# Patient Record
Sex: Female | Born: 1945 | Race: White | Hispanic: No | Smoking: Never smoker
Health system: Southern US, Community
[De-identification: ages and names within clinical notes are randomized; demographics above are authoritative.]

## PROBLEM LIST (undated history)

## (undated) DIAGNOSIS — I10 Essential (primary) hypertension: Secondary | ICD-10-CM

## (undated) DIAGNOSIS — E785 Hyperlipidemia, unspecified: Secondary | ICD-10-CM

## (undated) HISTORY — PX: CHOLECYSTECTOMY: SHX55

## (undated) HISTORY — DX: Hyperlipidemia, unspecified: E78.5

---

## 1998-06-27 ENCOUNTER — Ambulatory Visit (HOSPITAL_COMMUNITY): Admission: RE | Admit: 1998-06-27 | Discharge: 1998-06-27 | Payer: Self-pay | Admitting: Internal Medicine

## 1999-06-20 ENCOUNTER — Ambulatory Visit (HOSPITAL_COMMUNITY): Admission: RE | Admit: 1999-06-20 | Discharge: 1999-06-20 | Payer: Self-pay | Admitting: Gastroenterology

## 1999-10-21 ENCOUNTER — Emergency Department (HOSPITAL_COMMUNITY): Admission: EM | Admit: 1999-10-21 | Discharge: 1999-10-21 | Payer: Self-pay | Admitting: Emergency Medicine

## 2000-06-10 ENCOUNTER — Encounter: Payer: Self-pay | Admitting: Internal Medicine

## 2000-06-10 ENCOUNTER — Ambulatory Visit (HOSPITAL_COMMUNITY): Admission: RE | Admit: 2000-06-10 | Discharge: 2000-06-10 | Payer: Self-pay | Admitting: Internal Medicine

## 2000-09-05 ENCOUNTER — Other Ambulatory Visit: Admission: RE | Admit: 2000-09-05 | Discharge: 2000-09-05 | Payer: Self-pay | Admitting: *Deleted

## 2001-03-11 ENCOUNTER — Encounter: Payer: Self-pay | Admitting: Orthopedic Surgery

## 2001-03-11 ENCOUNTER — Encounter: Admission: RE | Admit: 2001-03-11 | Discharge: 2001-03-11 | Payer: Self-pay | Admitting: Orthopedic Surgery

## 2003-07-04 ENCOUNTER — Encounter: Admission: RE | Admit: 2003-07-04 | Discharge: 2003-10-02 | Payer: Self-pay | Admitting: *Deleted

## 2003-10-23 ENCOUNTER — Emergency Department (HOSPITAL_COMMUNITY): Admission: EM | Admit: 2003-10-23 | Discharge: 2003-10-23 | Payer: Self-pay | Admitting: Emergency Medicine

## 2004-01-12 ENCOUNTER — Inpatient Hospital Stay (HOSPITAL_COMMUNITY): Admission: EM | Admit: 2004-01-12 | Discharge: 2004-01-16 | Payer: Self-pay | Admitting: Emergency Medicine

## 2004-01-12 IMAGING — CR DG SHOULDER 2+V PORT*R*
1 series · 1 of 1 positions shown · non-contrast
Comparison: none

CLINICAL DATA: Status post open reduction and internal fixation of a right humeral neck fracture.    
 PORTABLE SINGLE VIEW RIGHT SHOULDER

[view not recorded]
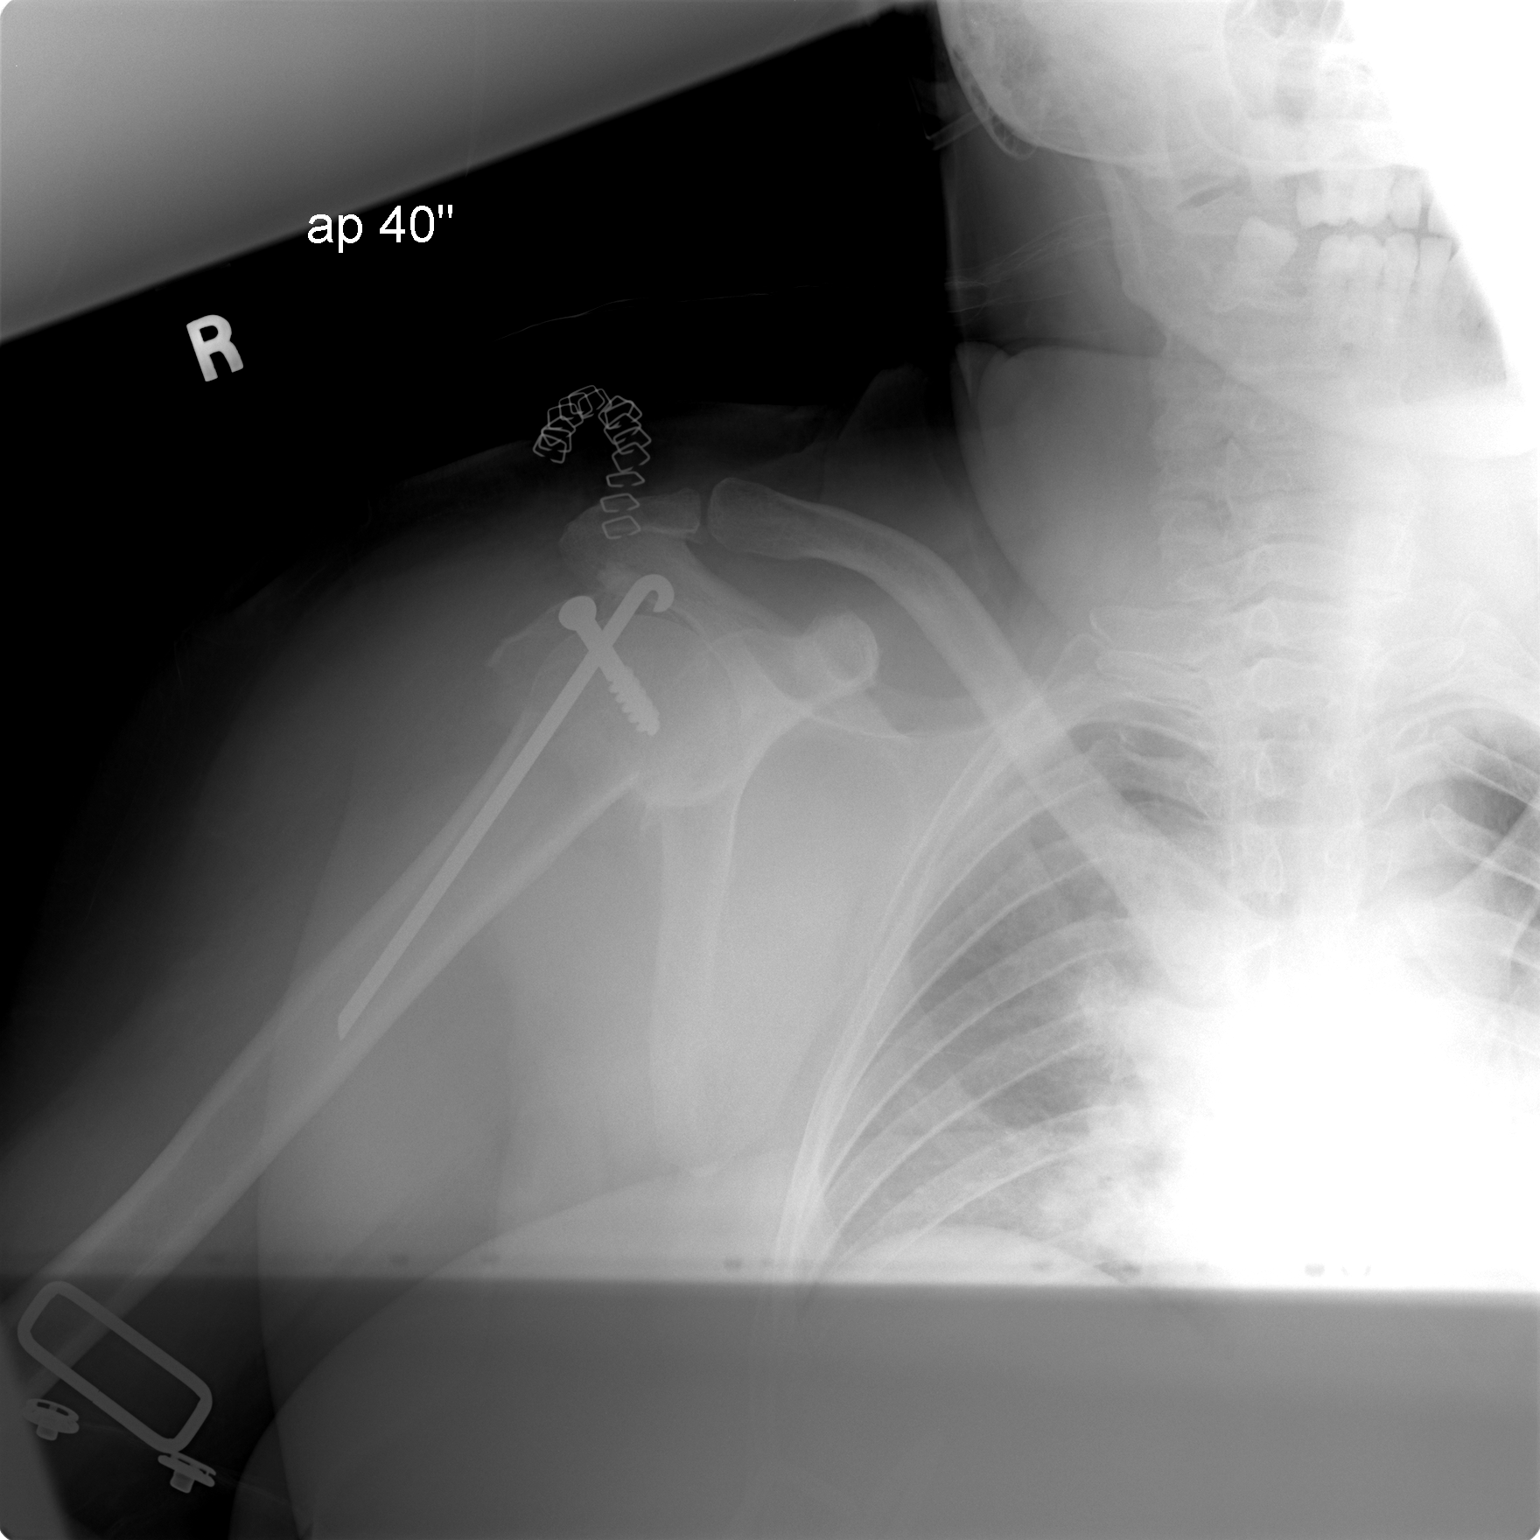

[1 of 1 positions shown; findings below may reference images not displayed]

FINDINGS: A single portable view of the right shoulder demonstrates open reduction and internal fixation of a right proximal humerus surgical neck fracture with residual impaction and a displaced fragment laterally.  Humeral head remains articulated to the glenoid fossa.  Overlying skin staples and soft tissue air is evident from the surgery.  
 IMPRESSION
 Stable post open reduction and internal fixation of a right proximal humerus surgical neck fracture with residual mild impaction and superolateral displaced fragment.

## 2004-01-12 IMAGING — CR DG SHOULDER 2+V*R*
2 series · 2 of 2 positions shown · non-contrast
Comparison: none

CLINICAL DATA: Fell.  
 RIGHT SHOULDER (TWO VIEWS)

[view not recorded (1 of 2)]
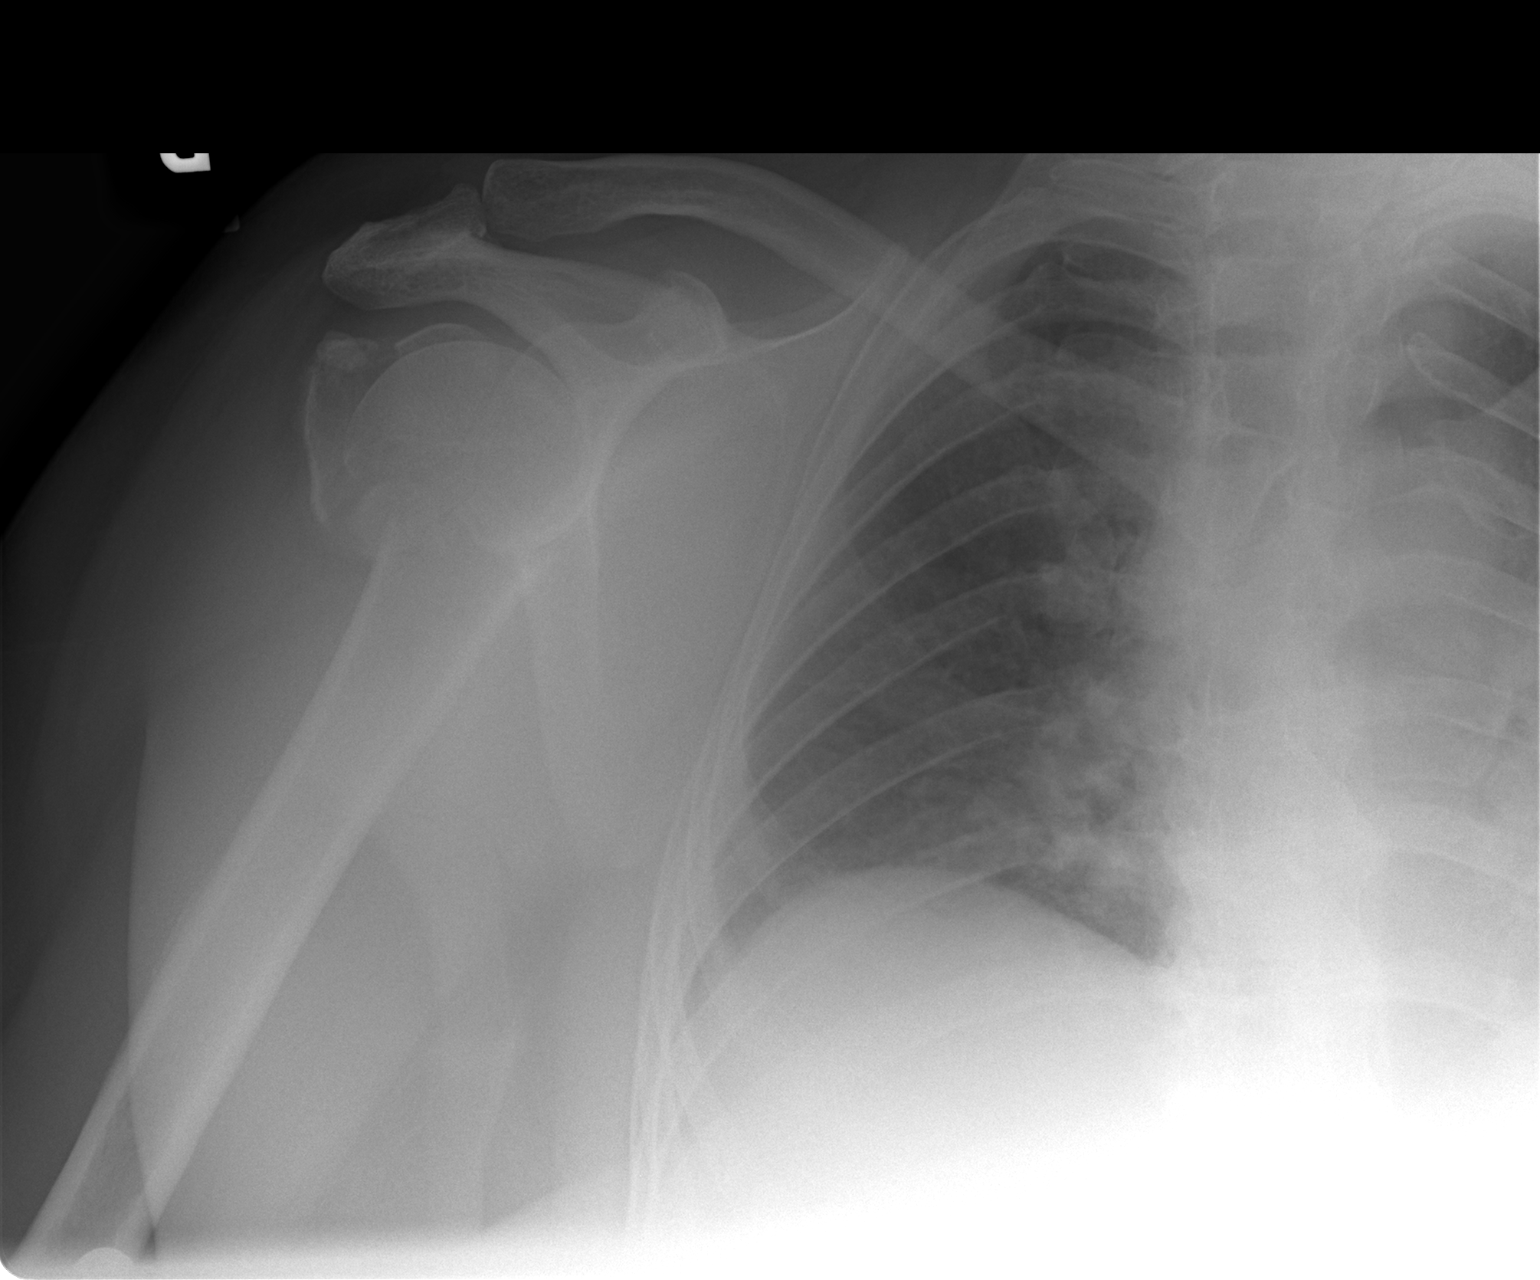

[view not recorded (2 of 2)]
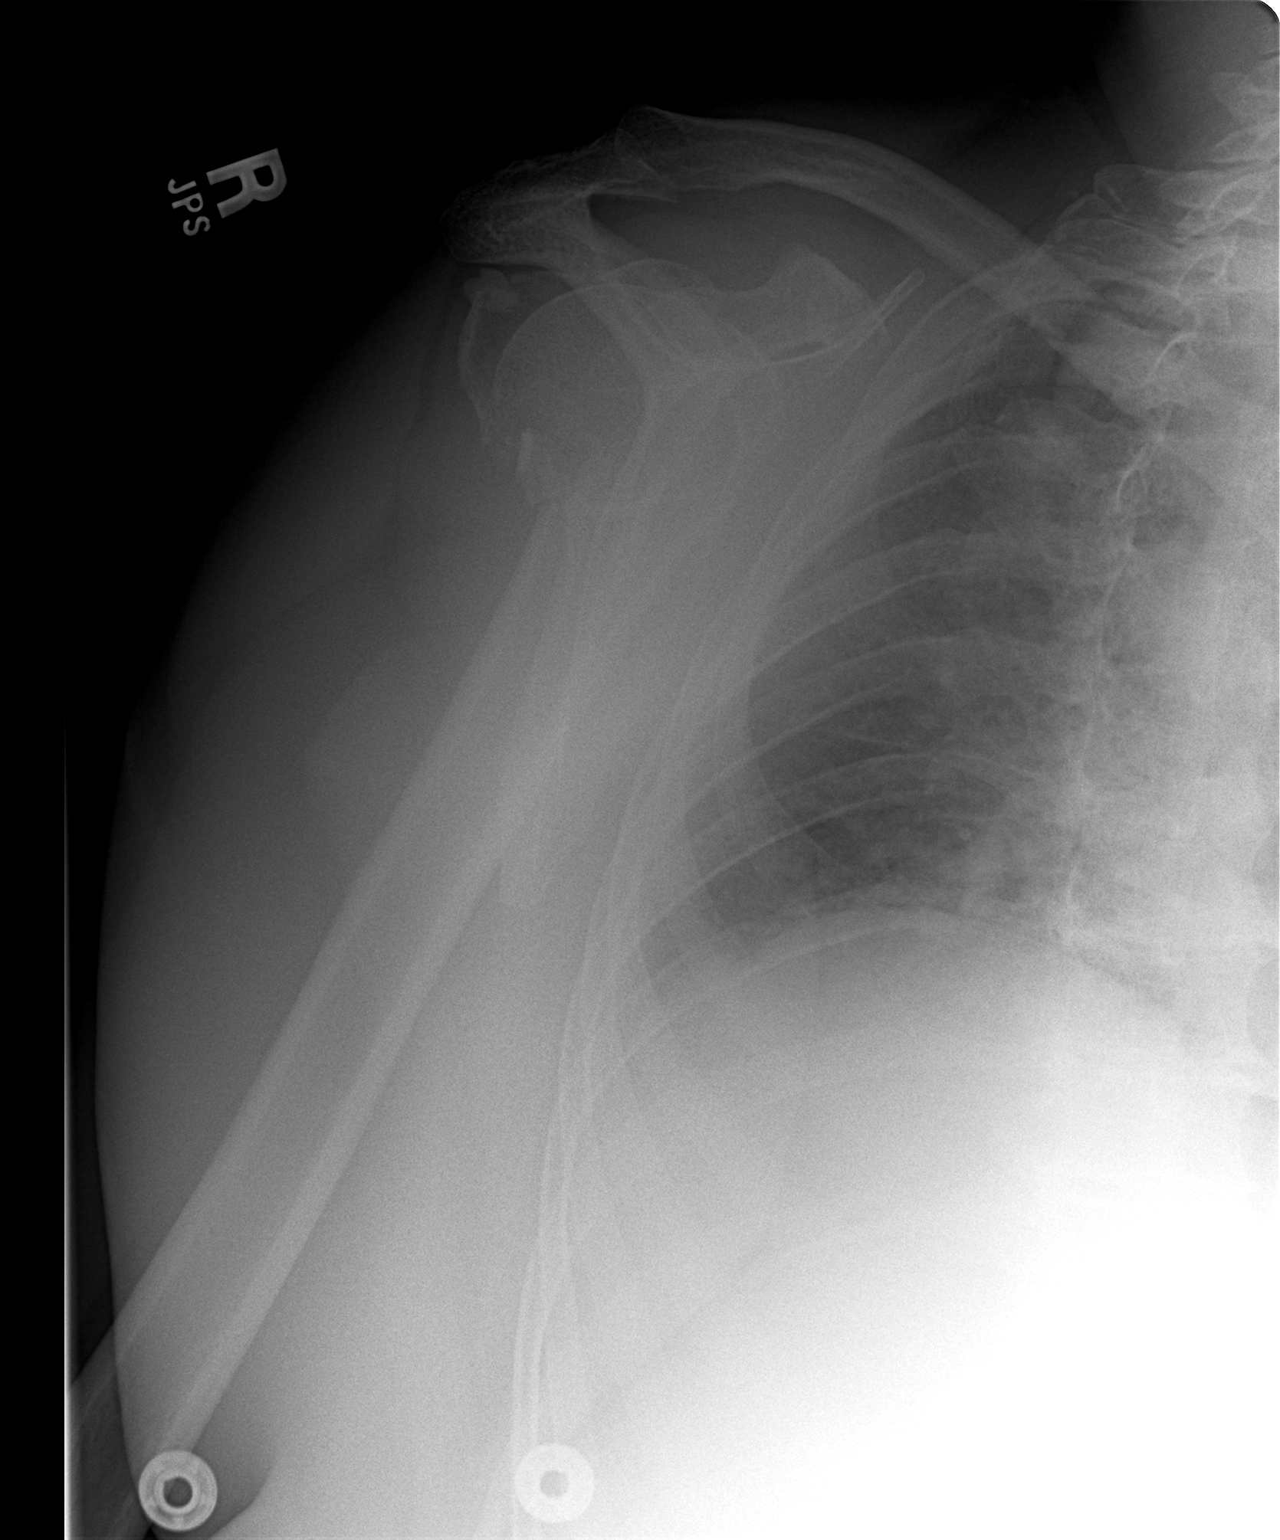

[2 of 2 positions shown; findings below may reference images not displayed]

FINDINGS: There is a transverse comminuted fracture of the surgical neck of the humerus.  The greater tuberosity exists as a separate fracture fragment.  There is mild angulation and impaction of the major fracture fragments and some distraction of the greater tuberosity.  
 IMPRESSION
 Comminuted surgical neck fracture as detailed above.  
 RIGHT HUMERUS (TWO VIEWS)
FINDINGS: Comminuted fracture of the proximal humerus through the surgical neck and involving the greater tuberosity as above.  The distal shaft is unremarkable.  
 IMPRESSION
 Surgical neck comminuted fracture as above.

## 2004-01-12 IMAGING — CR DG CHEST 1V
1 series · 1 of 1 positions shown · non-contrast
Comparison: none

CLINICAL DATA: Humerus fracture.  
 AP CHEST ? [DATE]
 There is a comminuted impacted slightly displaced fracture of the proximal right humerus.  Heart size and vascularity within normal limits considering the AP technique.  The lungs are clear.  
 IMPRESSION 
 Right humeral fracture.

[view not recorded]
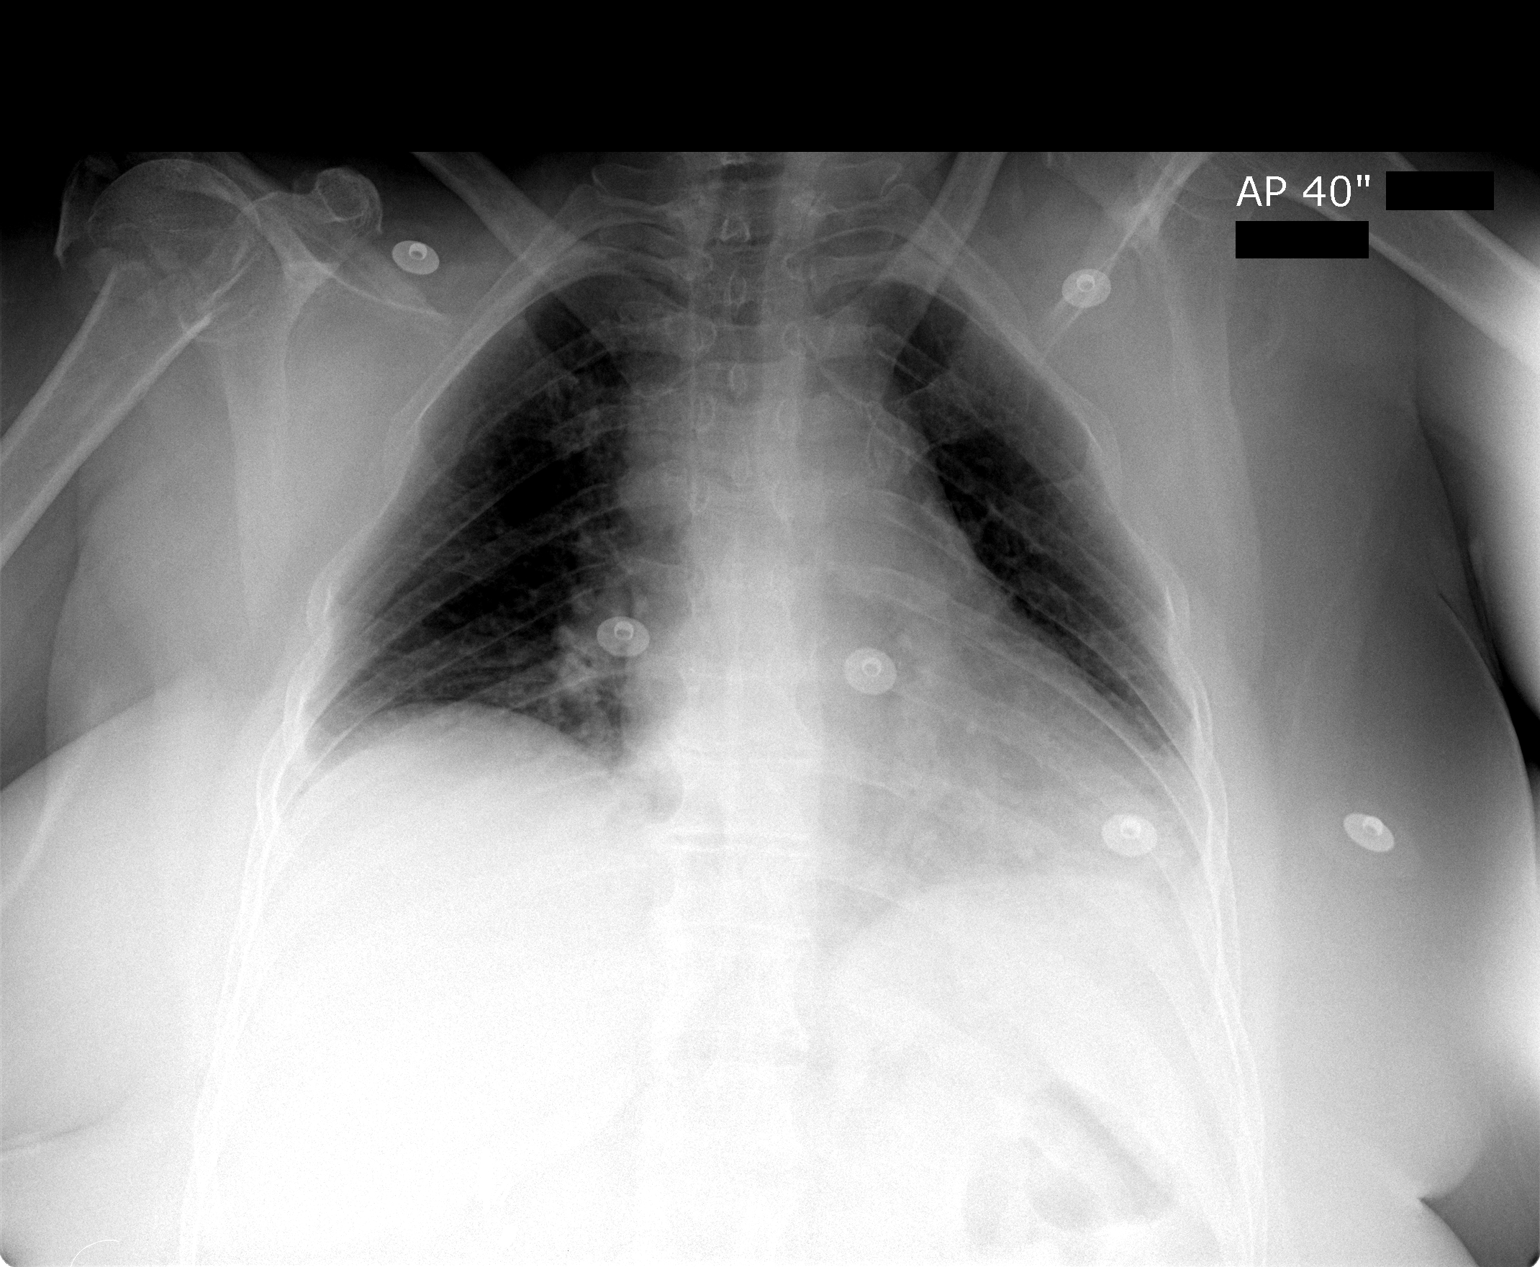

[1 of 1 positions shown; findings below may reference images not displayed]

## 2004-01-12 IMAGING — CR DG HUMERUS 2V *R*
2 series · 2 of 2 positions shown · non-contrast
Comparison: none

CLINICAL DATA: Fell.  
 RIGHT SHOULDER (TWO VIEWS)

[view not recorded (1 of 2)]
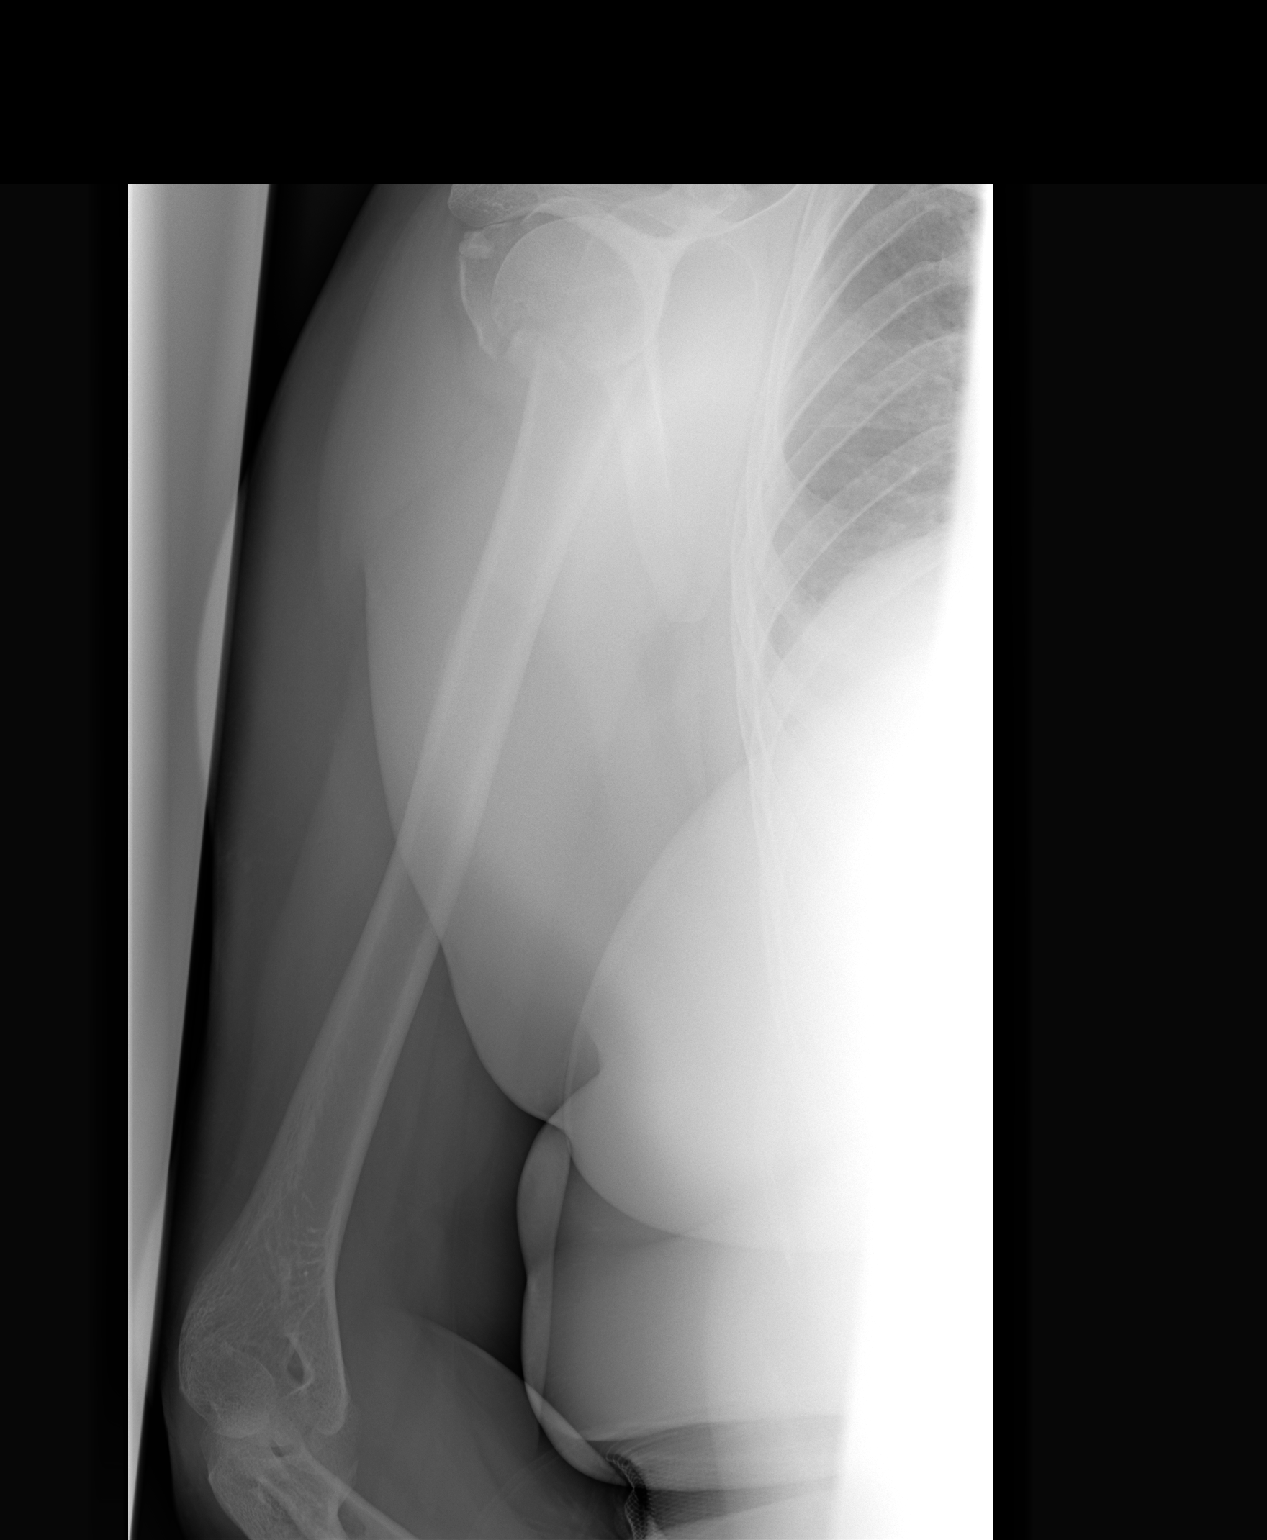

[view not recorded (2 of 2)]
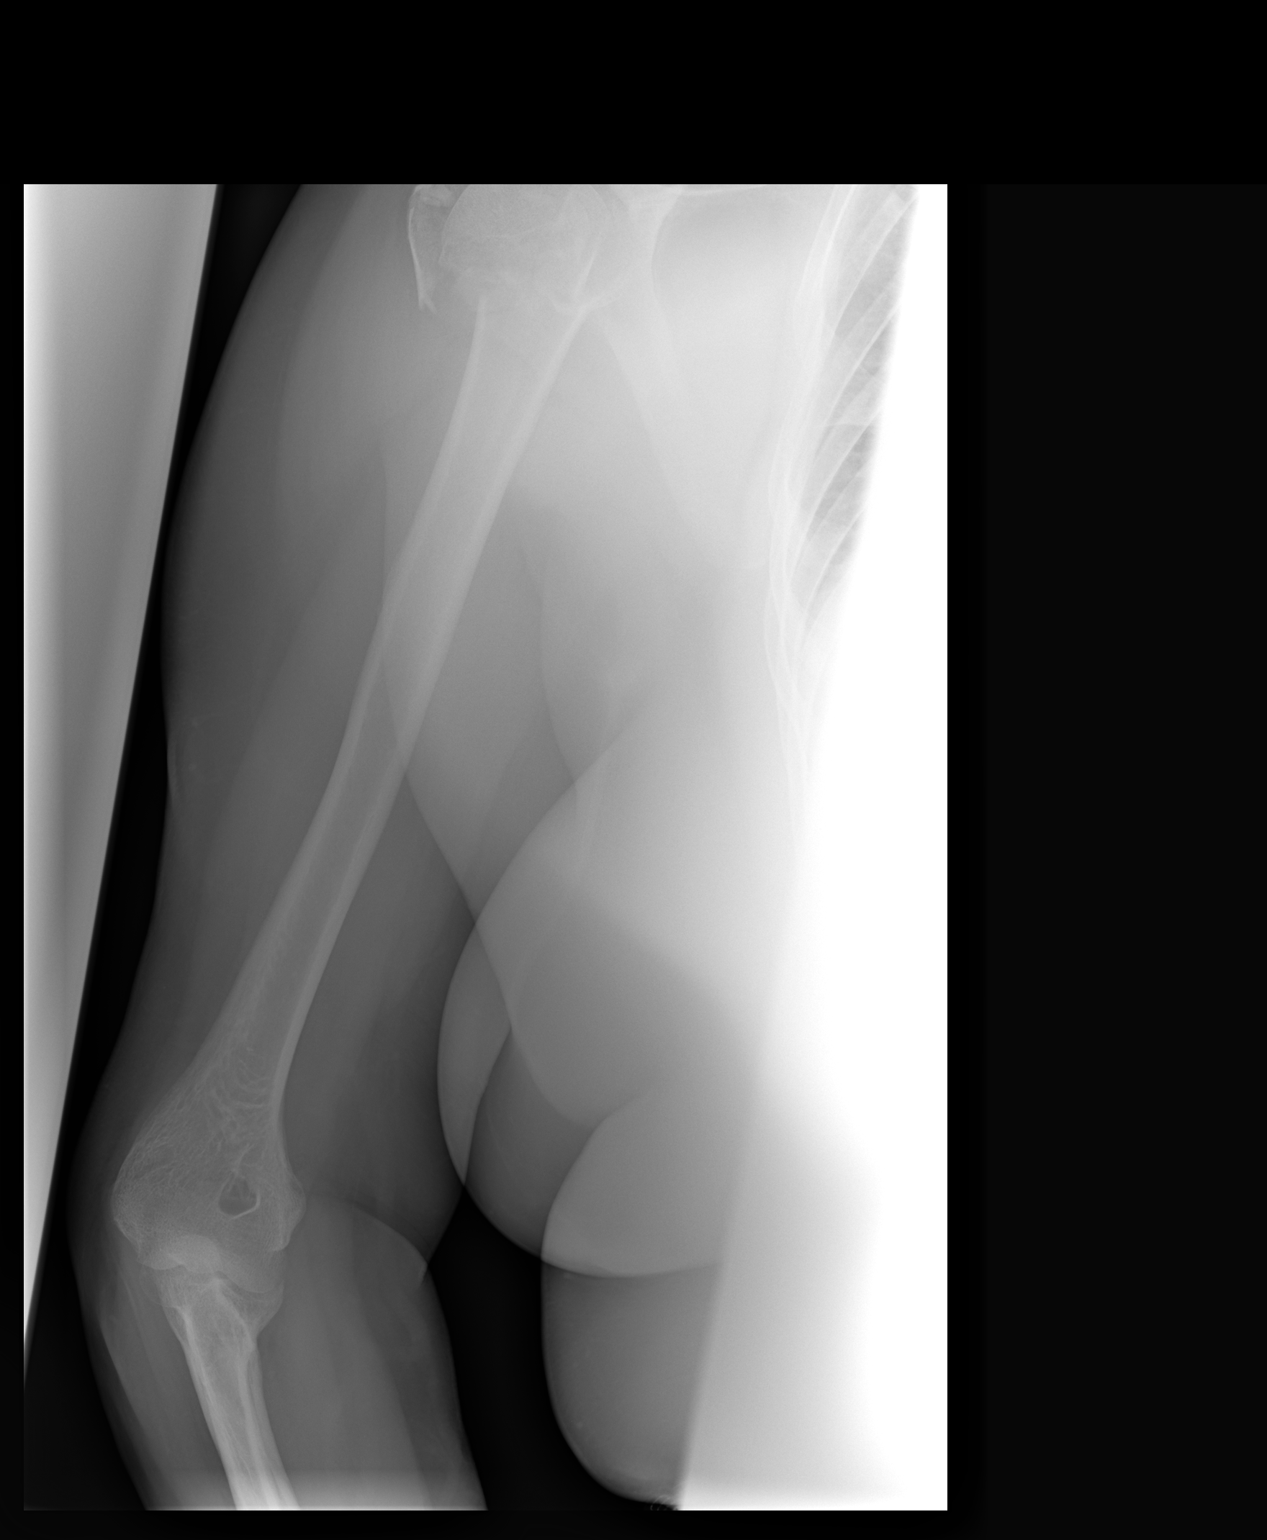

[2 of 2 positions shown; findings below may reference images not displayed]

FINDINGS: There is a transverse comminuted fracture of the surgical neck of the humerus.  The greater tuberosity exists as a separate fracture fragment.  There is mild angulation and impaction of the major fracture fragments and some distraction of the greater tuberosity.  
 IMPRESSION
 Comminuted surgical neck fracture as detailed above.  
 RIGHT HUMERUS (TWO VIEWS)
FINDINGS: Comminuted fracture of the proximal humerus through the surgical neck and involving the greater tuberosity as above.  The distal shaft is unremarkable.  
 IMPRESSION
 Surgical neck comminuted fracture as above.

## 2004-01-18 ENCOUNTER — Emergency Department (HOSPITAL_COMMUNITY): Admission: EM | Admit: 2004-01-18 | Discharge: 2004-01-18 | Payer: Self-pay | Admitting: Emergency Medicine

## 2004-03-01 ENCOUNTER — Encounter: Admission: RE | Admit: 2004-03-01 | Discharge: 2004-05-30 | Payer: Self-pay | Admitting: Orthopedic Surgery

## 2004-03-20 IMAGING — CR DG CHEST 2V
2 series · 2 of 2 positions shown · non-contrast
Comparison: [DATE].

CLINICAL DATA: Hypertension.  Chest pain.  Fracture of right humerus.  Preoperative respiratory exam. 
 PA AND LATERAL CHEST [DATE]

[view not recorded (1 of 2)]
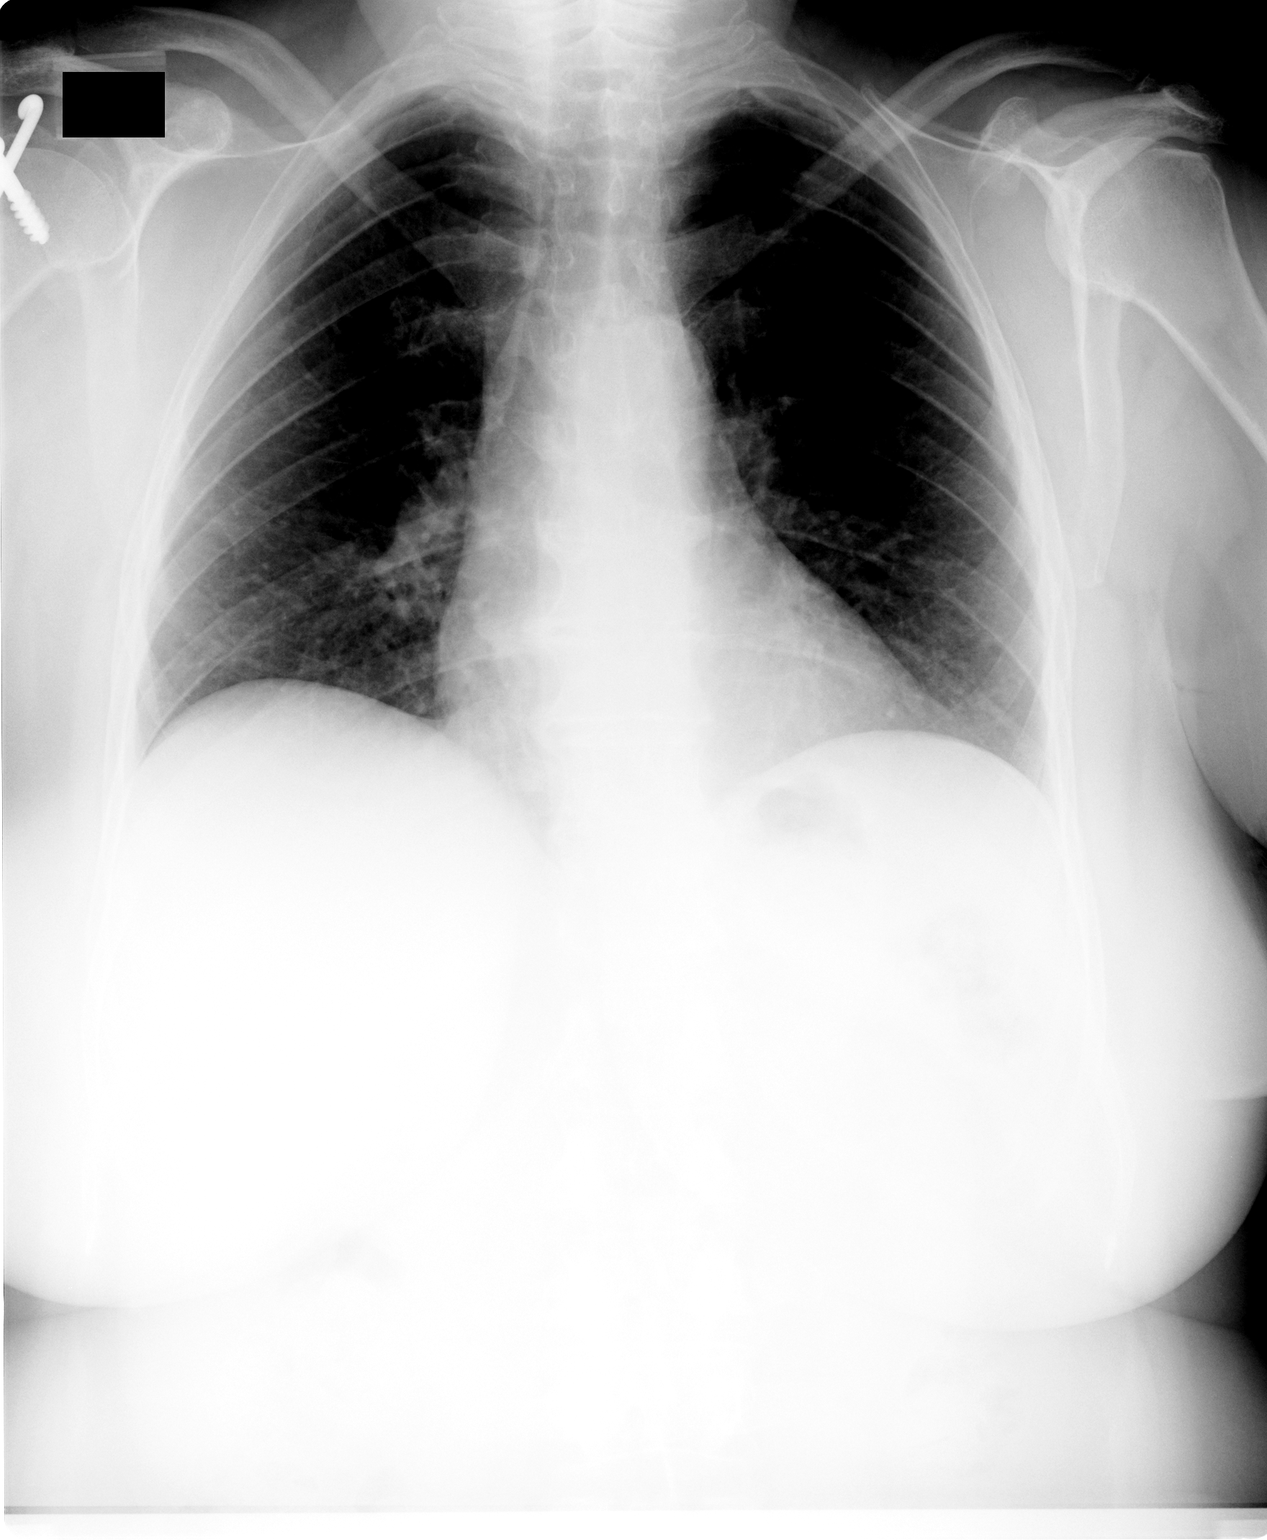

[view not recorded (2 of 2)]
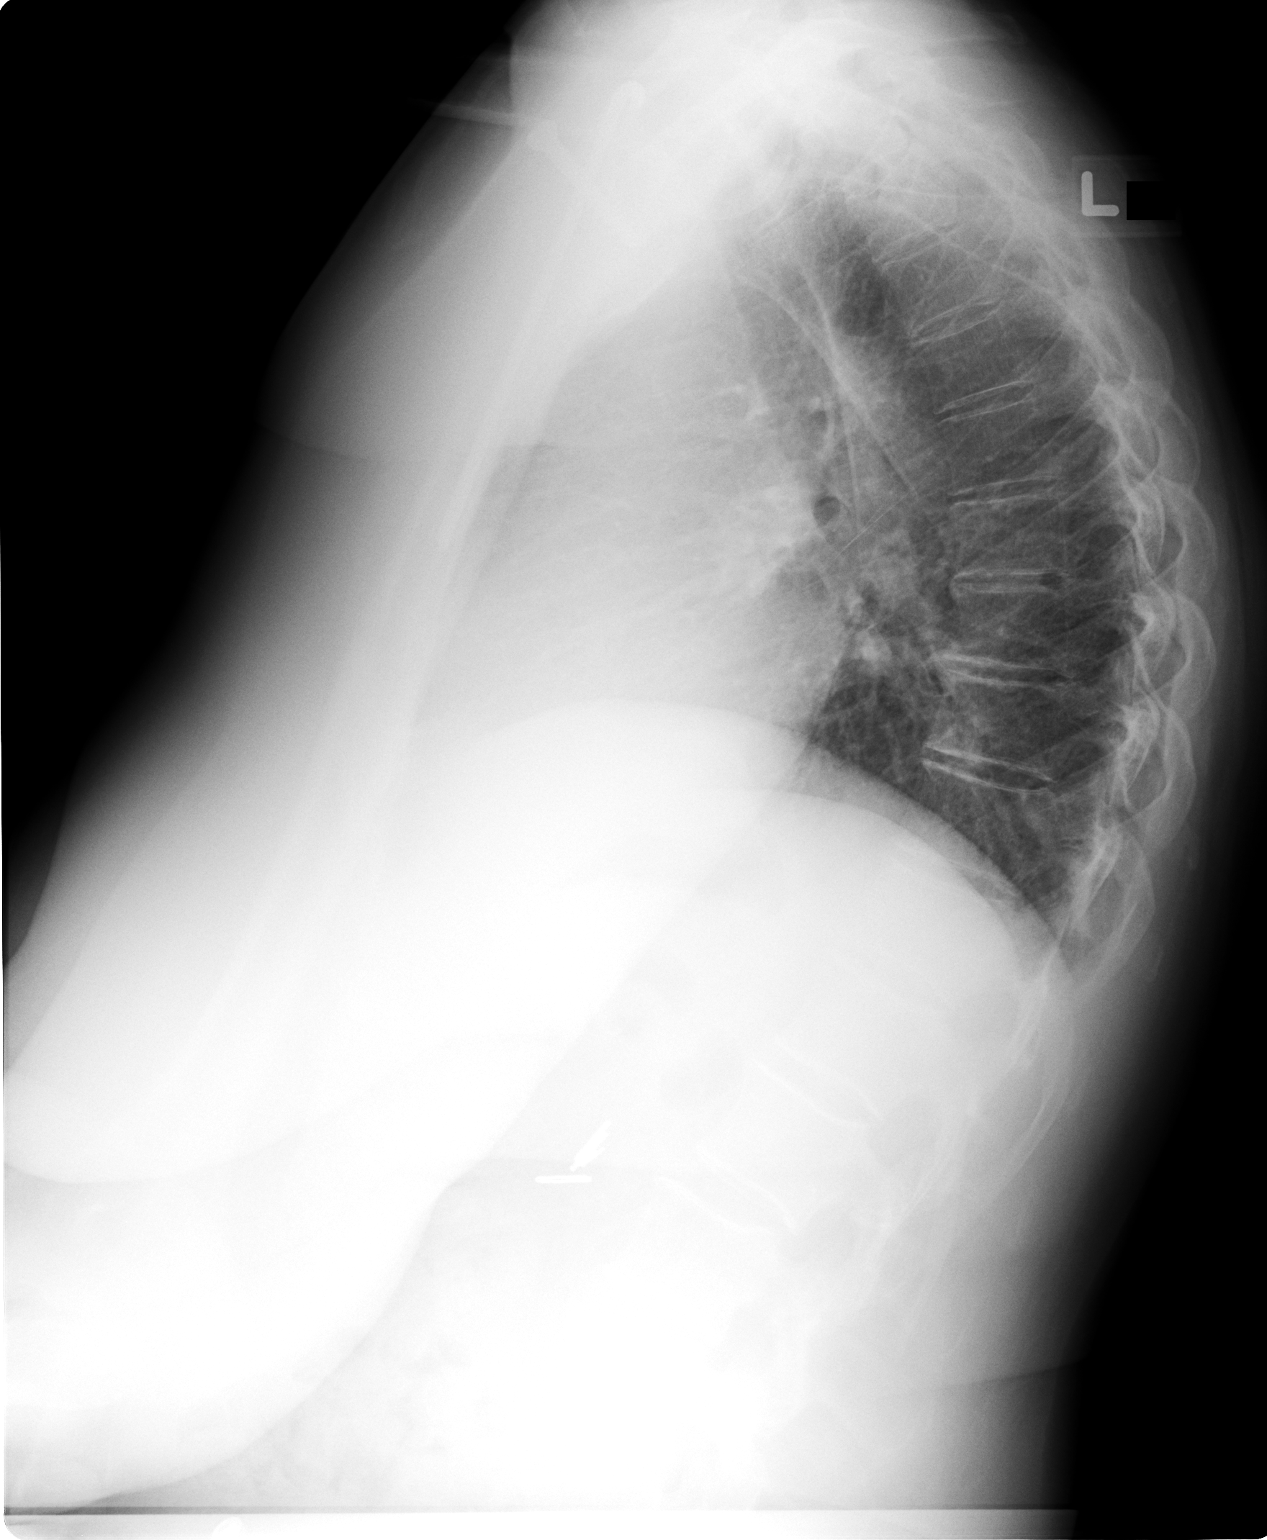

[2 of 2 positions shown; findings below may reference images not displayed]

FINDINGS: Heart is slightly enlarged.  Mild central pulmonary vascular prominence.  No evidence of infiltrate or congestive heart failure.  
 IMPRESSION
 Mild cardiomegaly and central pulmonary vascular prominence. 
 No evidence of infiltrate or congestive heart failure.

## 2004-03-21 ENCOUNTER — Ambulatory Visit (HOSPITAL_COMMUNITY): Admission: RE | Admit: 2004-03-21 | Discharge: 2004-03-21 | Payer: Self-pay | Admitting: Orthopedic Surgery

## 2004-04-26 ENCOUNTER — Ambulatory Visit (HOSPITAL_COMMUNITY): Admission: RE | Admit: 2004-04-26 | Discharge: 2004-04-26 | Payer: Self-pay | Admitting: Urology

## 2004-05-21 ENCOUNTER — Ambulatory Visit (HOSPITAL_BASED_OUTPATIENT_CLINIC_OR_DEPARTMENT_OTHER): Admission: RE | Admit: 2004-05-21 | Discharge: 2004-05-21 | Payer: Self-pay | Admitting: Urology

## 2004-05-21 ENCOUNTER — Ambulatory Visit (HOSPITAL_COMMUNITY): Admission: RE | Admit: 2004-05-21 | Discharge: 2004-05-21 | Payer: Self-pay | Admitting: Urology

## 2004-05-21 ENCOUNTER — Encounter (INDEPENDENT_AMBULATORY_CARE_PROVIDER_SITE_OTHER): Payer: Self-pay | Admitting: Specialist

## 2004-05-31 ENCOUNTER — Encounter: Admission: RE | Admit: 2004-05-31 | Discharge: 2004-06-01 | Payer: Self-pay | Admitting: Orthopedic Surgery

## 2004-08-16 ENCOUNTER — Inpatient Hospital Stay (HOSPITAL_COMMUNITY): Admission: RE | Admit: 2004-08-16 | Discharge: 2004-08-19 | Payer: Self-pay | Admitting: Orthopedic Surgery

## 2004-08-16 IMAGING — CR DG SHOULDER 2+V PORT*R*
1 series · 1 of 1 positions shown · non-contrast
Comparison: none

CLINICAL DATA: Right shoulder hemiarthroplasty. 
 SINGLE VIEW RIGHT SHOULDER 
 Comparing [DATE].
 The patient has undergone right proximal humeral hemiarthroplasty.  The underlying surgical neck fracture is still vaguely apparent, but no new fracture is evident.  No complicating features are identified. 
 IMPRESSION
 Right proximal humeral hemiarthroplasty.  The patient had a prior surgical neck fracture and deformity associated with this fracture is still evident.  No new fracture is noted.

[view not recorded]
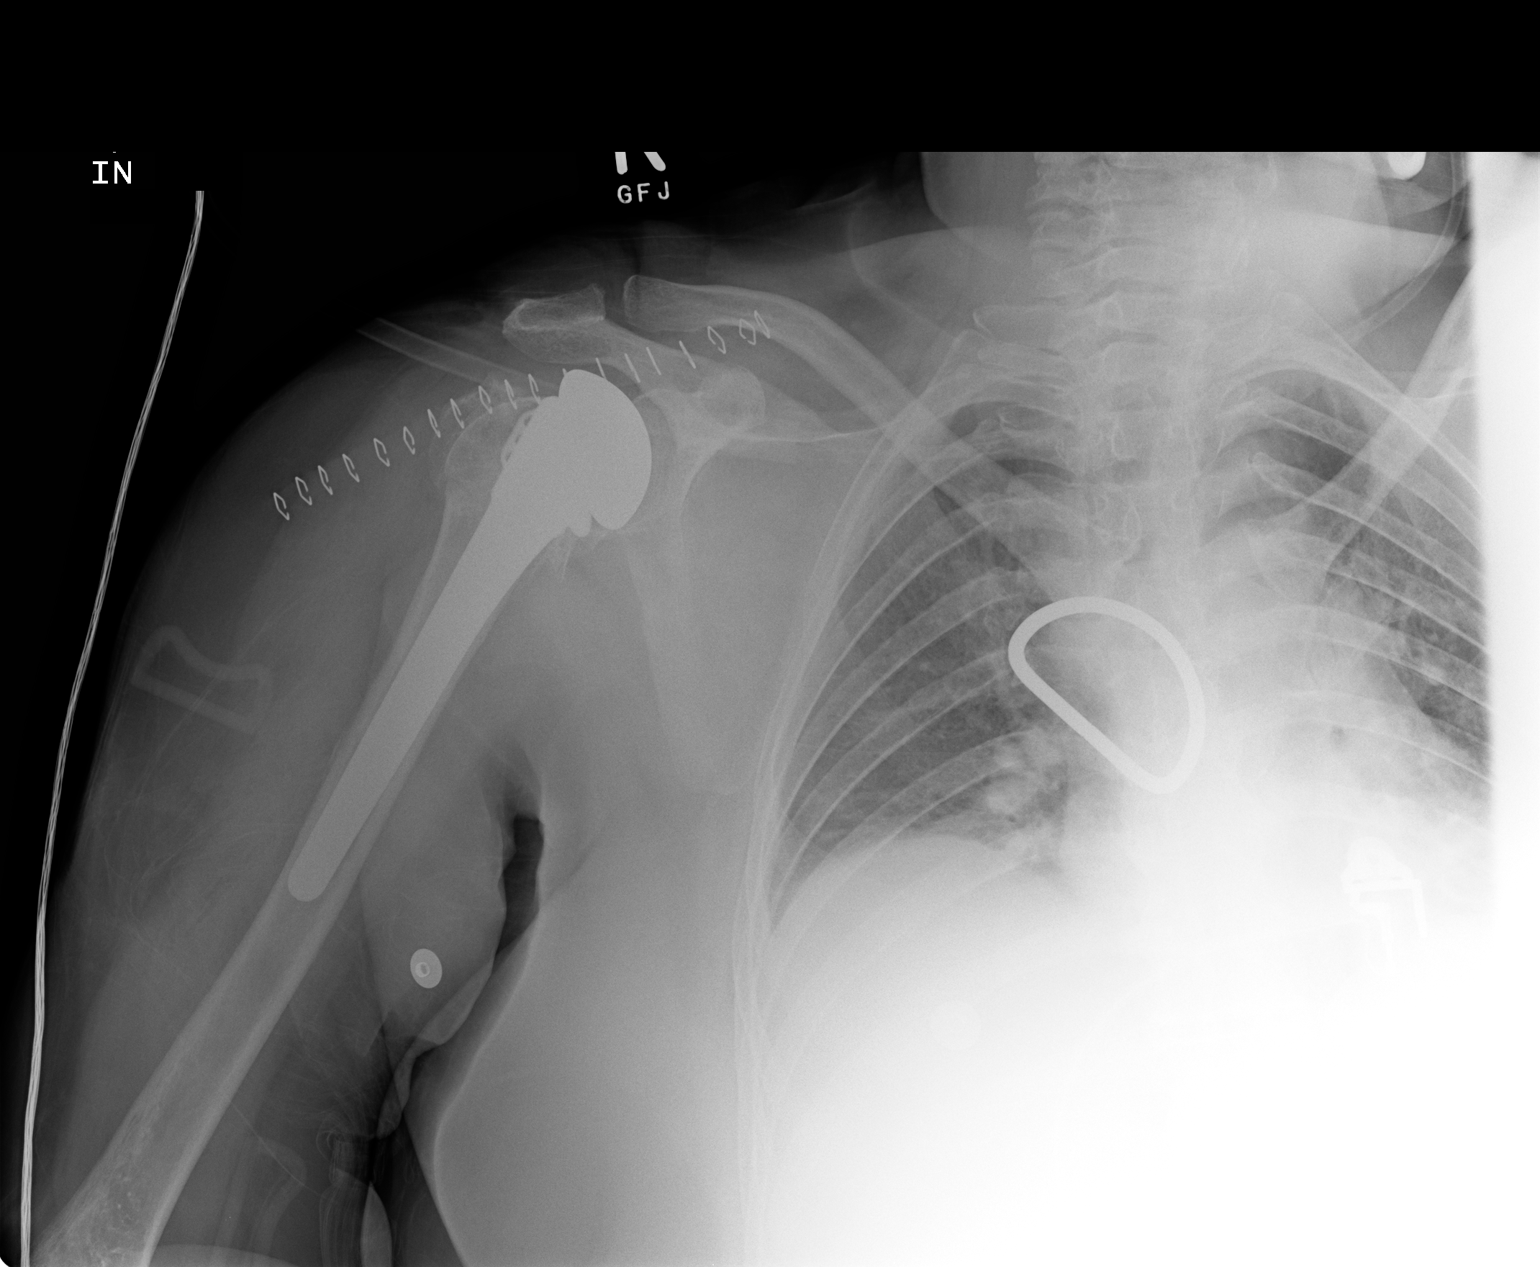

[1 of 1 positions shown; findings below may reference images not displayed]

## 2004-10-12 ENCOUNTER — Encounter: Admission: RE | Admit: 2004-10-12 | Discharge: 2004-11-22 | Payer: Self-pay | Admitting: Orthopedic Surgery

## 2004-12-12 ENCOUNTER — Encounter: Admission: RE | Admit: 2004-12-12 | Discharge: 2005-03-12 | Payer: Self-pay | Admitting: Orthopedic Surgery

## 2005-03-21 ENCOUNTER — Encounter: Admission: RE | Admit: 2005-03-21 | Discharge: 2005-04-17 | Payer: Self-pay | Admitting: Orthopedic Surgery

## 2005-08-15 ENCOUNTER — Emergency Department (HOSPITAL_COMMUNITY): Admission: EM | Admit: 2005-08-15 | Discharge: 2005-08-15 | Payer: Self-pay | Admitting: Emergency Medicine

## 2005-08-15 IMAGING — CR DG HAND COMPLETE 3+V*R*
3 series · 3 of 3 positions shown · non-contrast
Comparison: No prior studies.

CLINICAL DATA: Fall with injury.
 NASAL BONES - 3 VIEW:

[x hand ap right]
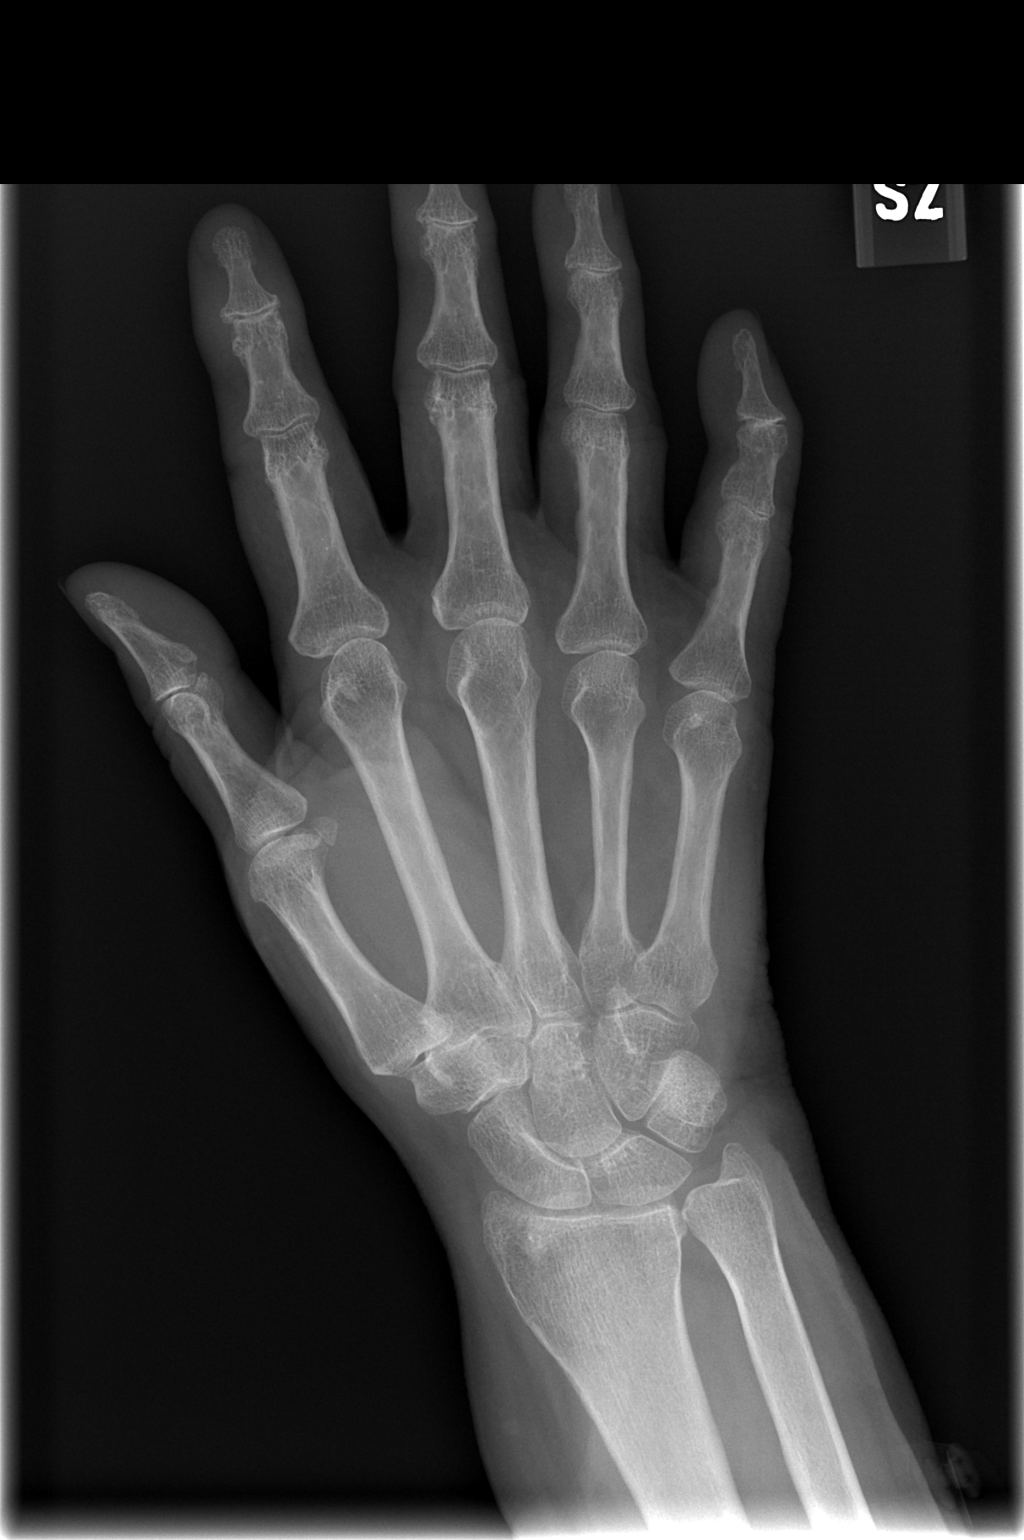

[x hand oblique right]
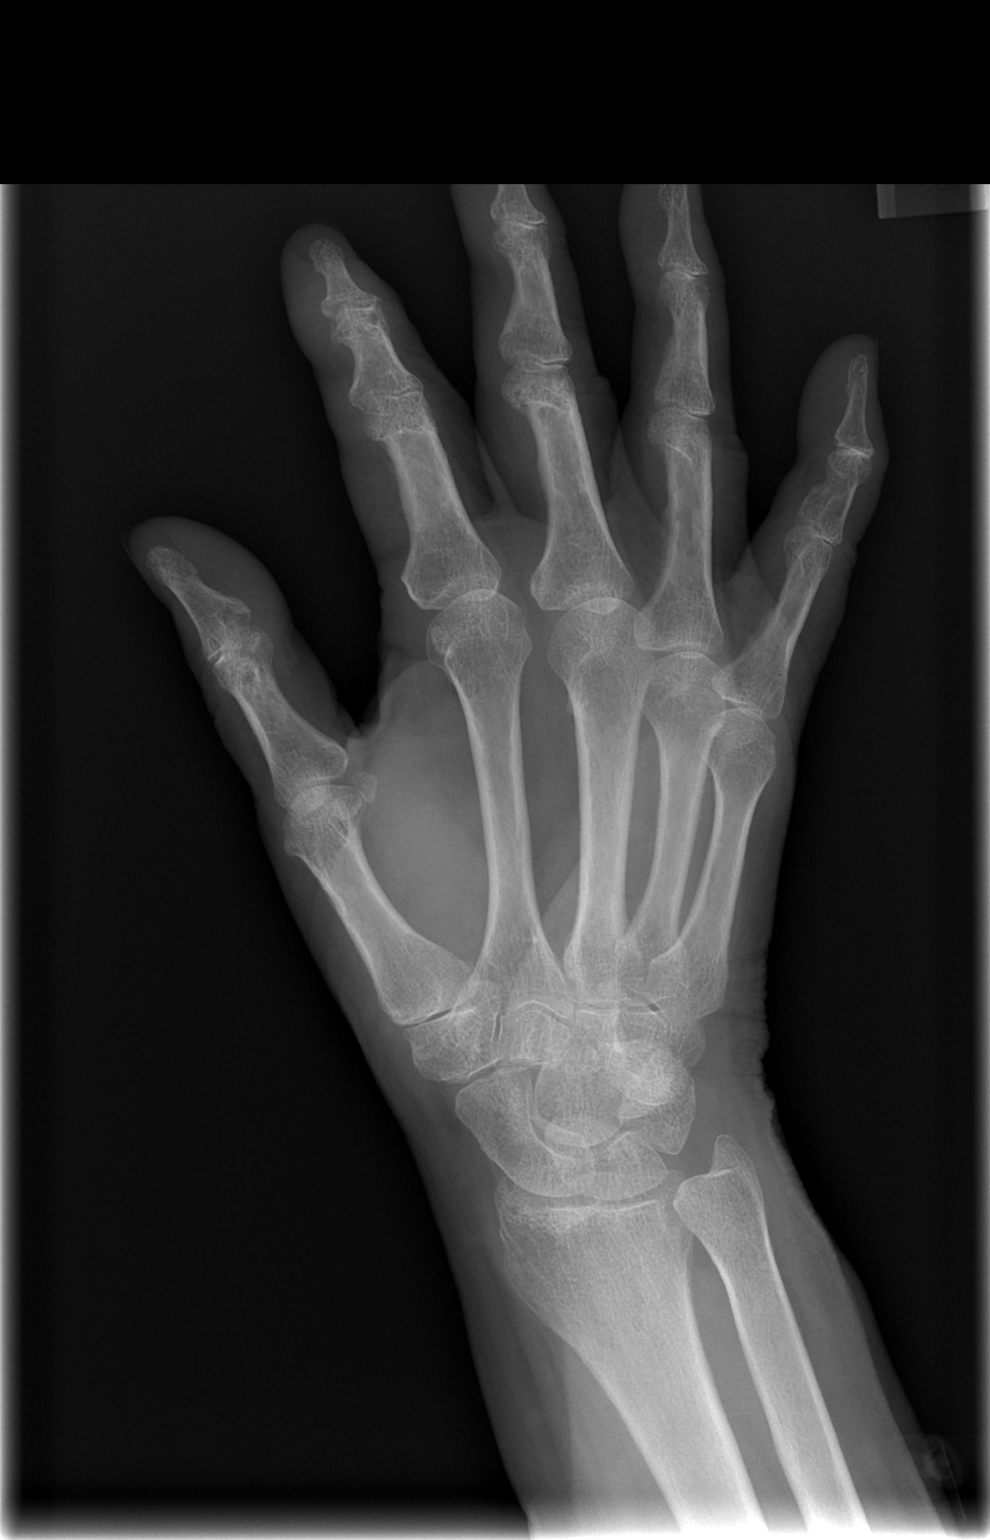

[x hand lat right]
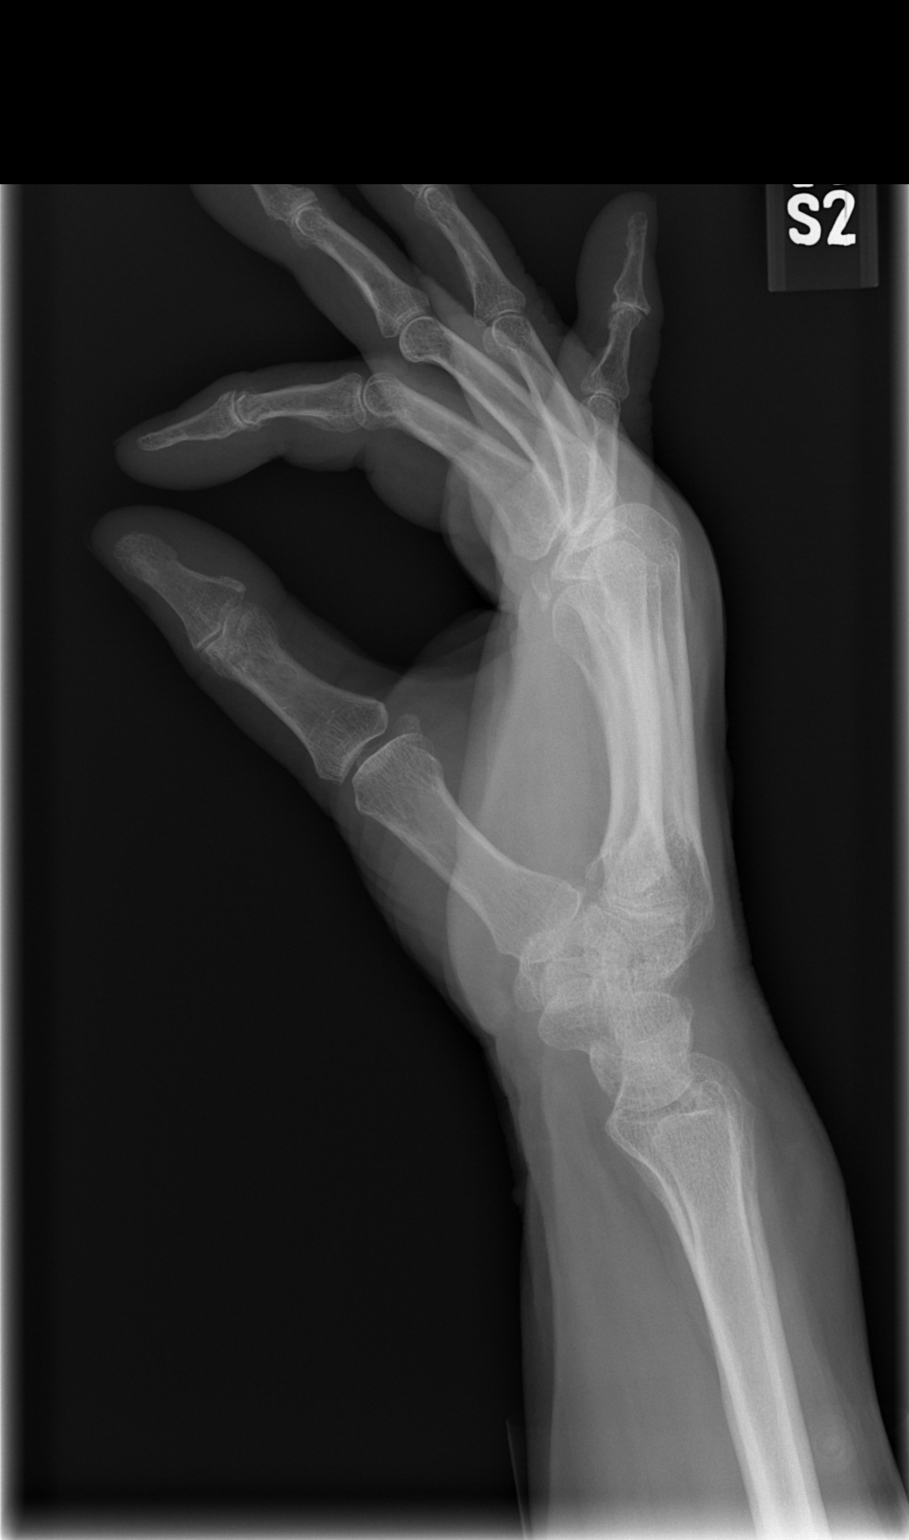

[3 of 3 positions shown; findings below may reference images not displayed]

FINDINGS: There is some indistinctness and irregularity anteriorly along the nasal bone suspicious for nondisplaced fracture.
IMPRESSION: Irregularity of the nasal bones anteriorly suspicious for nondisplaced fracture. 
 RIGHT HAND - 3 VIEW:
FINDINGS: There is some loss of articular space in the distal interphalangeal joints suggesting osteoarthritis.  No visible fracture or acute bony findings.
IMPRESSION: Osteoarthritis.  No acute bony findings.

## 2005-08-15 IMAGING — CR DG KNEE COMPLETE 4+V*R*
4 series · 4 of 4 positions shown · non-contrast
Comparison: none

CLINICAL DATA: Fall with abrasions to the knees. 
 RIGHT KNEE - 4 VIEW:
 No prior studies.

[t knee ap right]
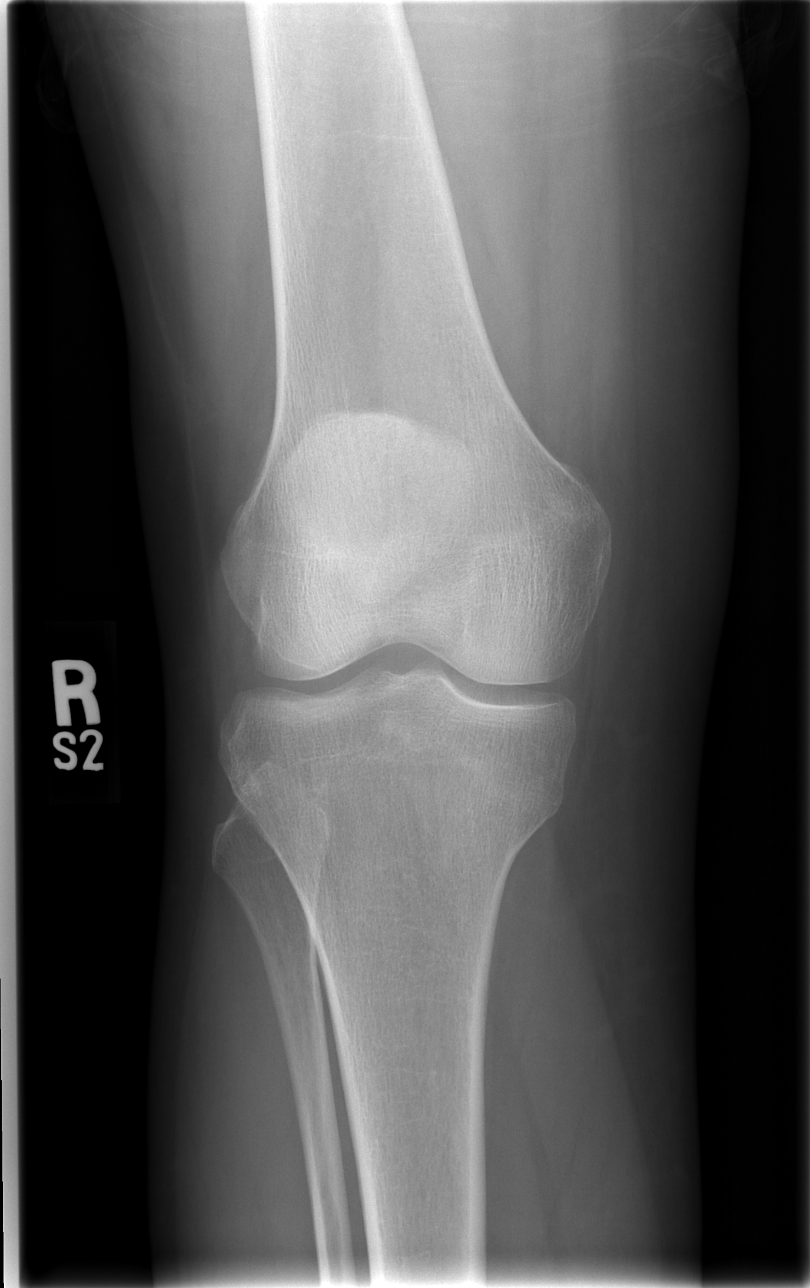

[t knee oblique right (1 of 2)]
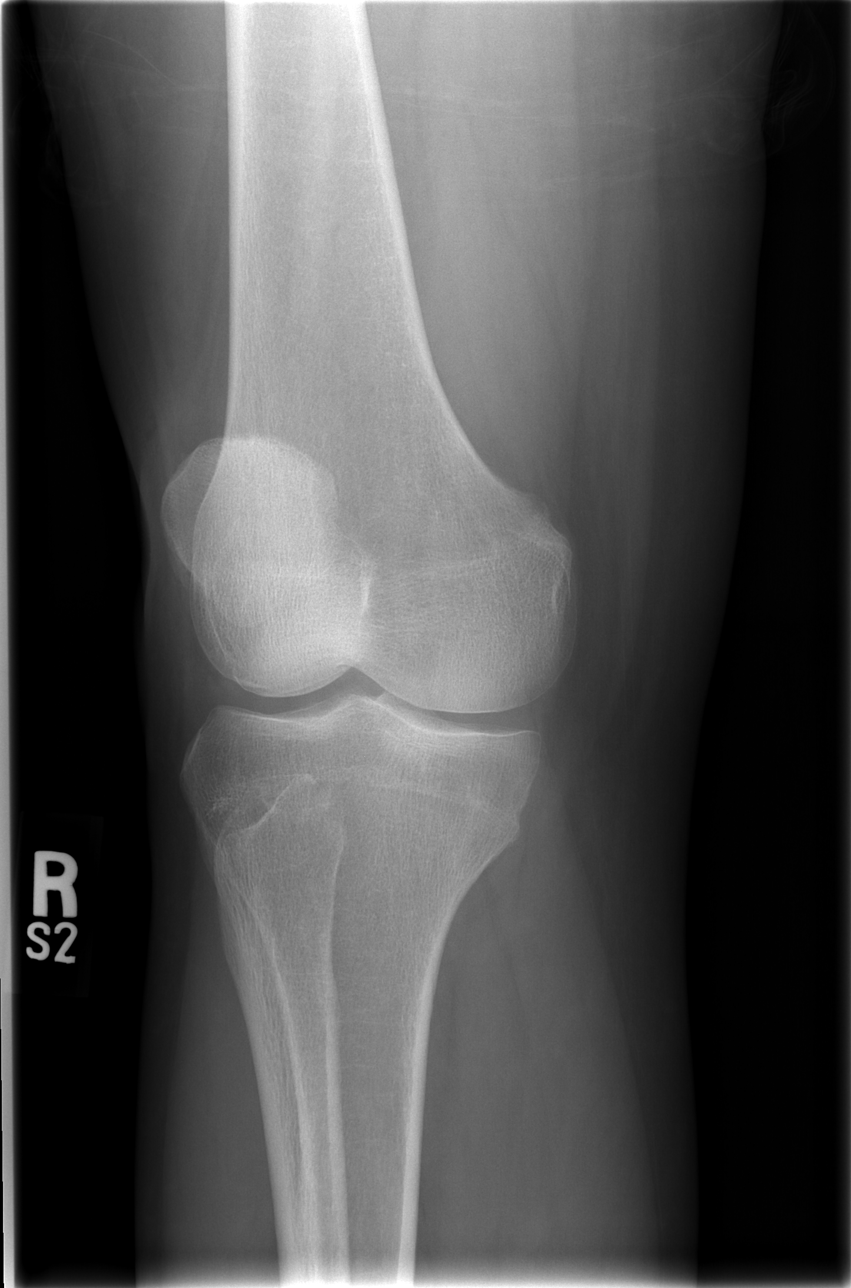

[t knee oblique right (2 of 2)]
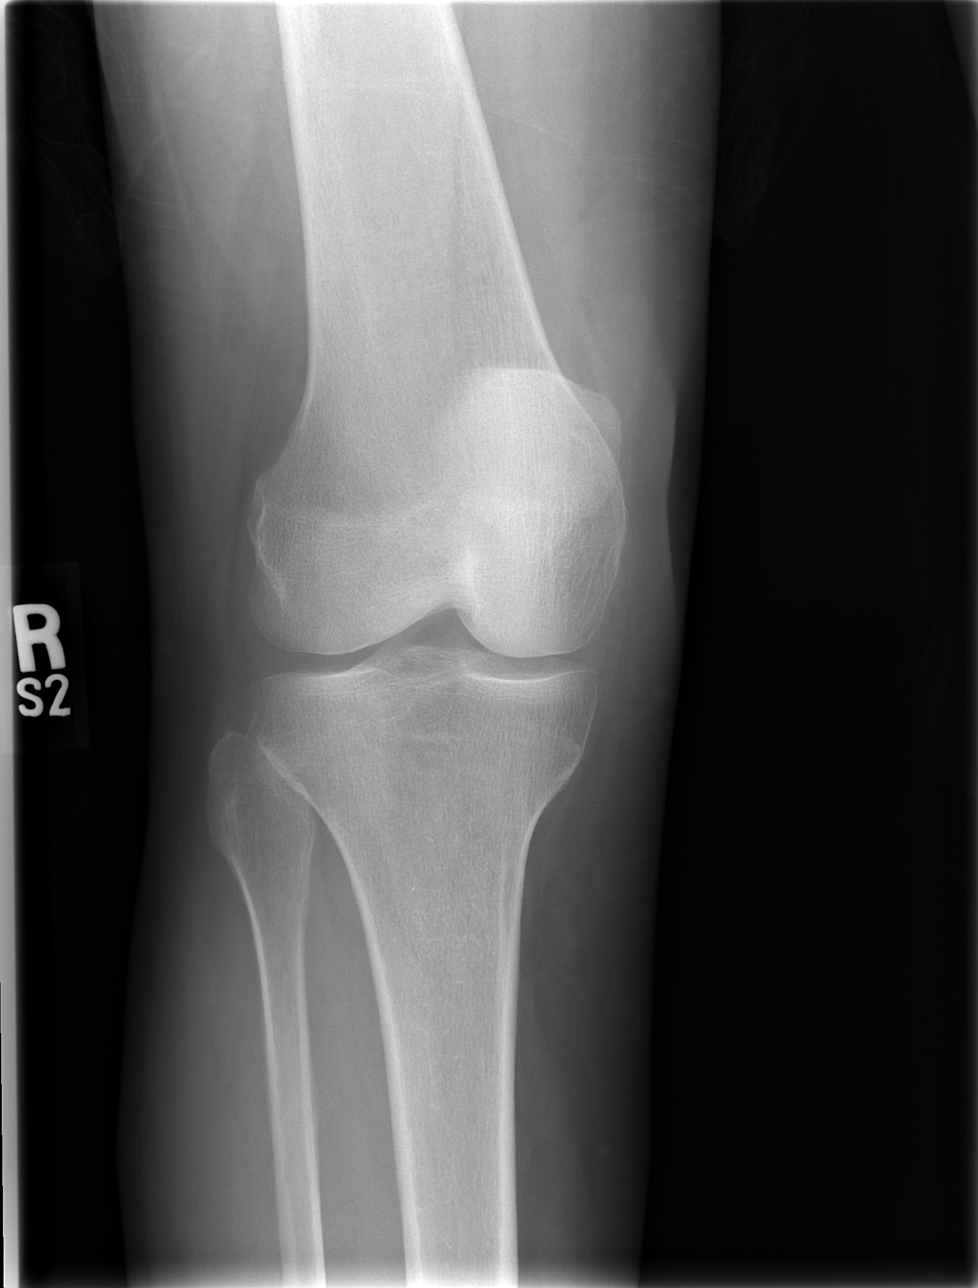

[t knee lat right]
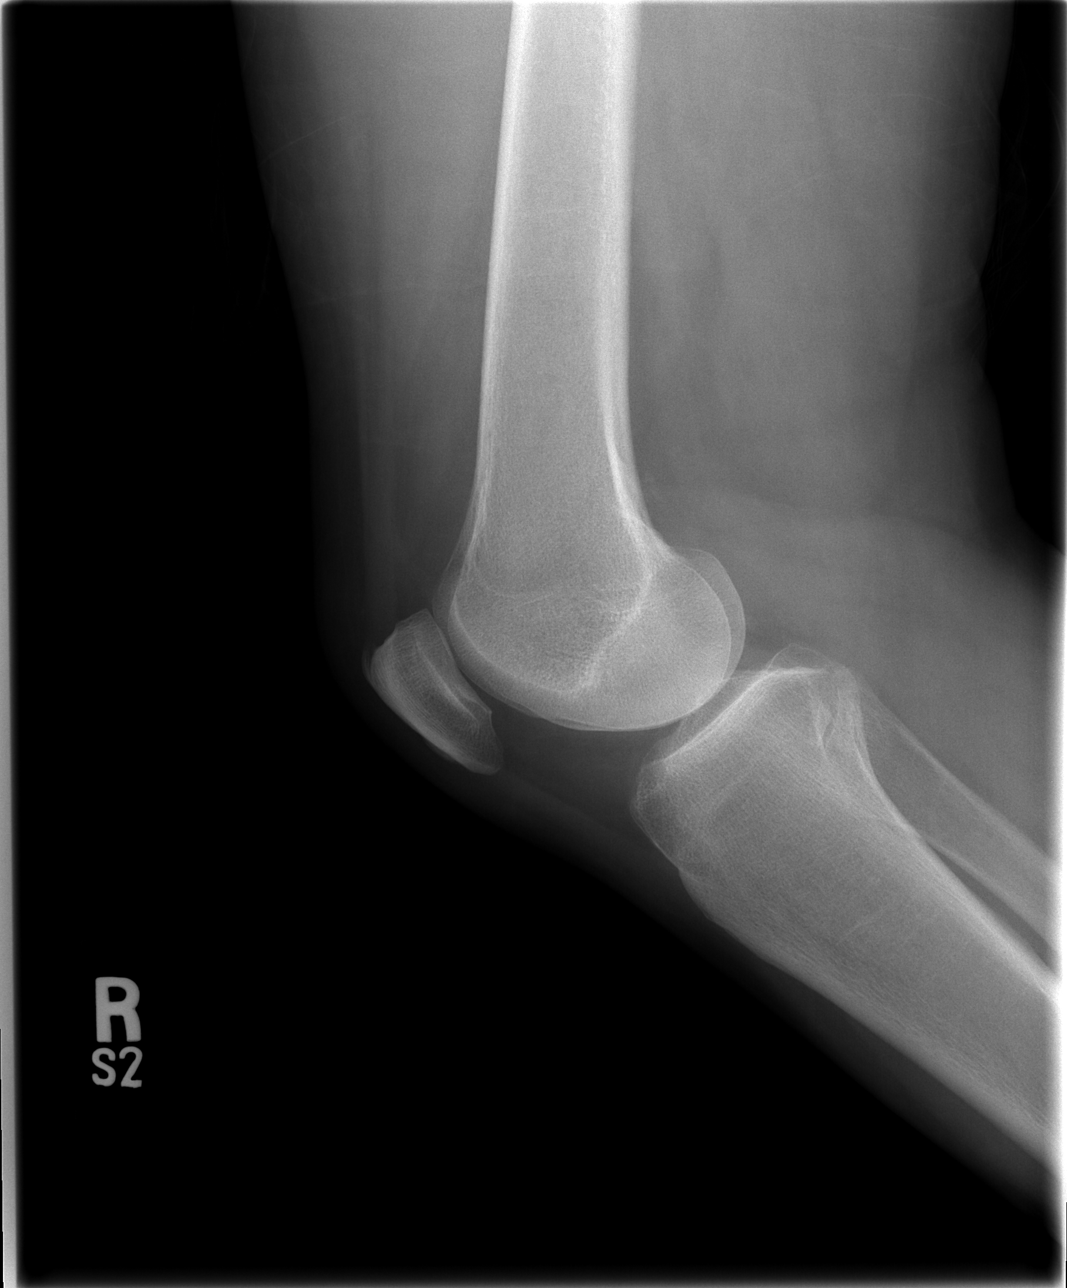

[4 of 4 positions shown; findings below may reference images not displayed]

FINDINGS: No visible fracture or effusion.  There is mild spurring at the quadriceps insertion site on the patella.
IMPRESSION: No acute radiographic findings. 
 LEFT KNEE - 4 VIEW:
FINDINGS: Mild spurring at the quadriceps insertion site is noted on the left side.  No visible fracture or effusion.
IMPRESSION: No acute bony findings.

## 2005-08-15 IMAGING — CR DG NASAL BONES 3+V
3 series · 3 of 3 positions shown · non-contrast
Comparison: No prior studies.

CLINICAL DATA: Fall with injury.
 NASAL BONES - 3 VIEW:

[view not recorded (1 of 3)]
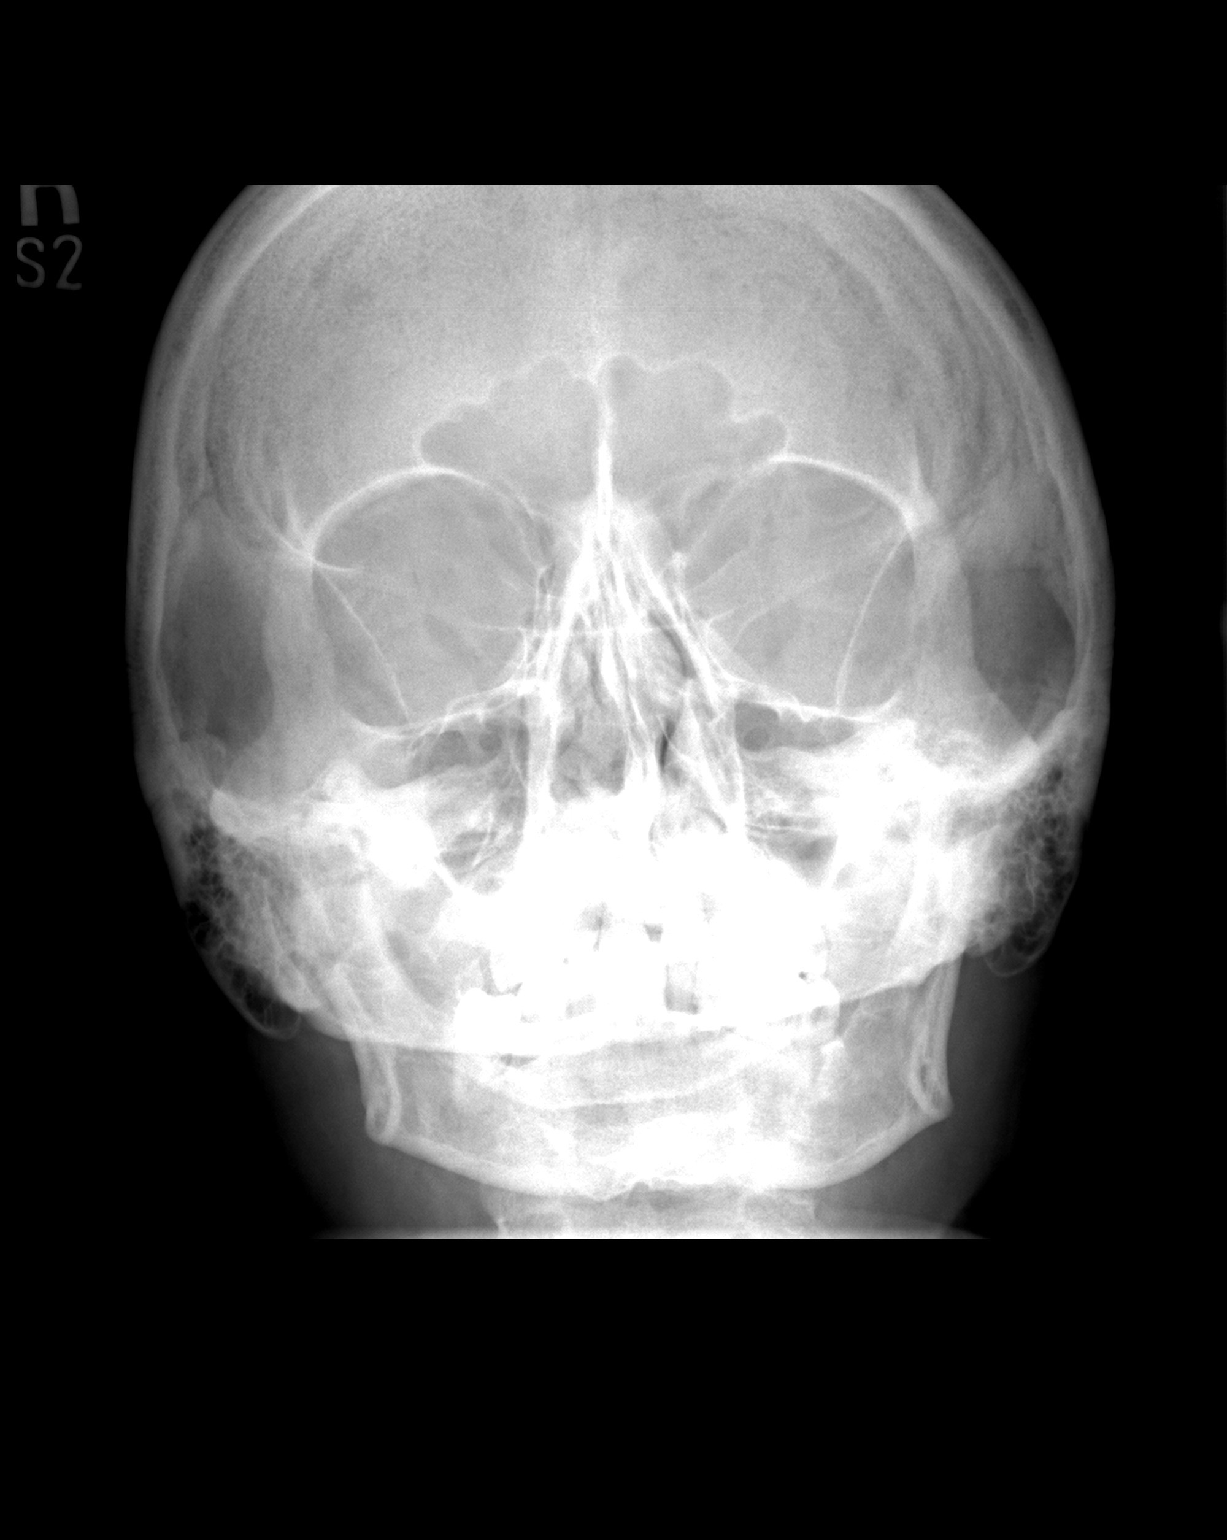

[view not recorded (2 of 3)]
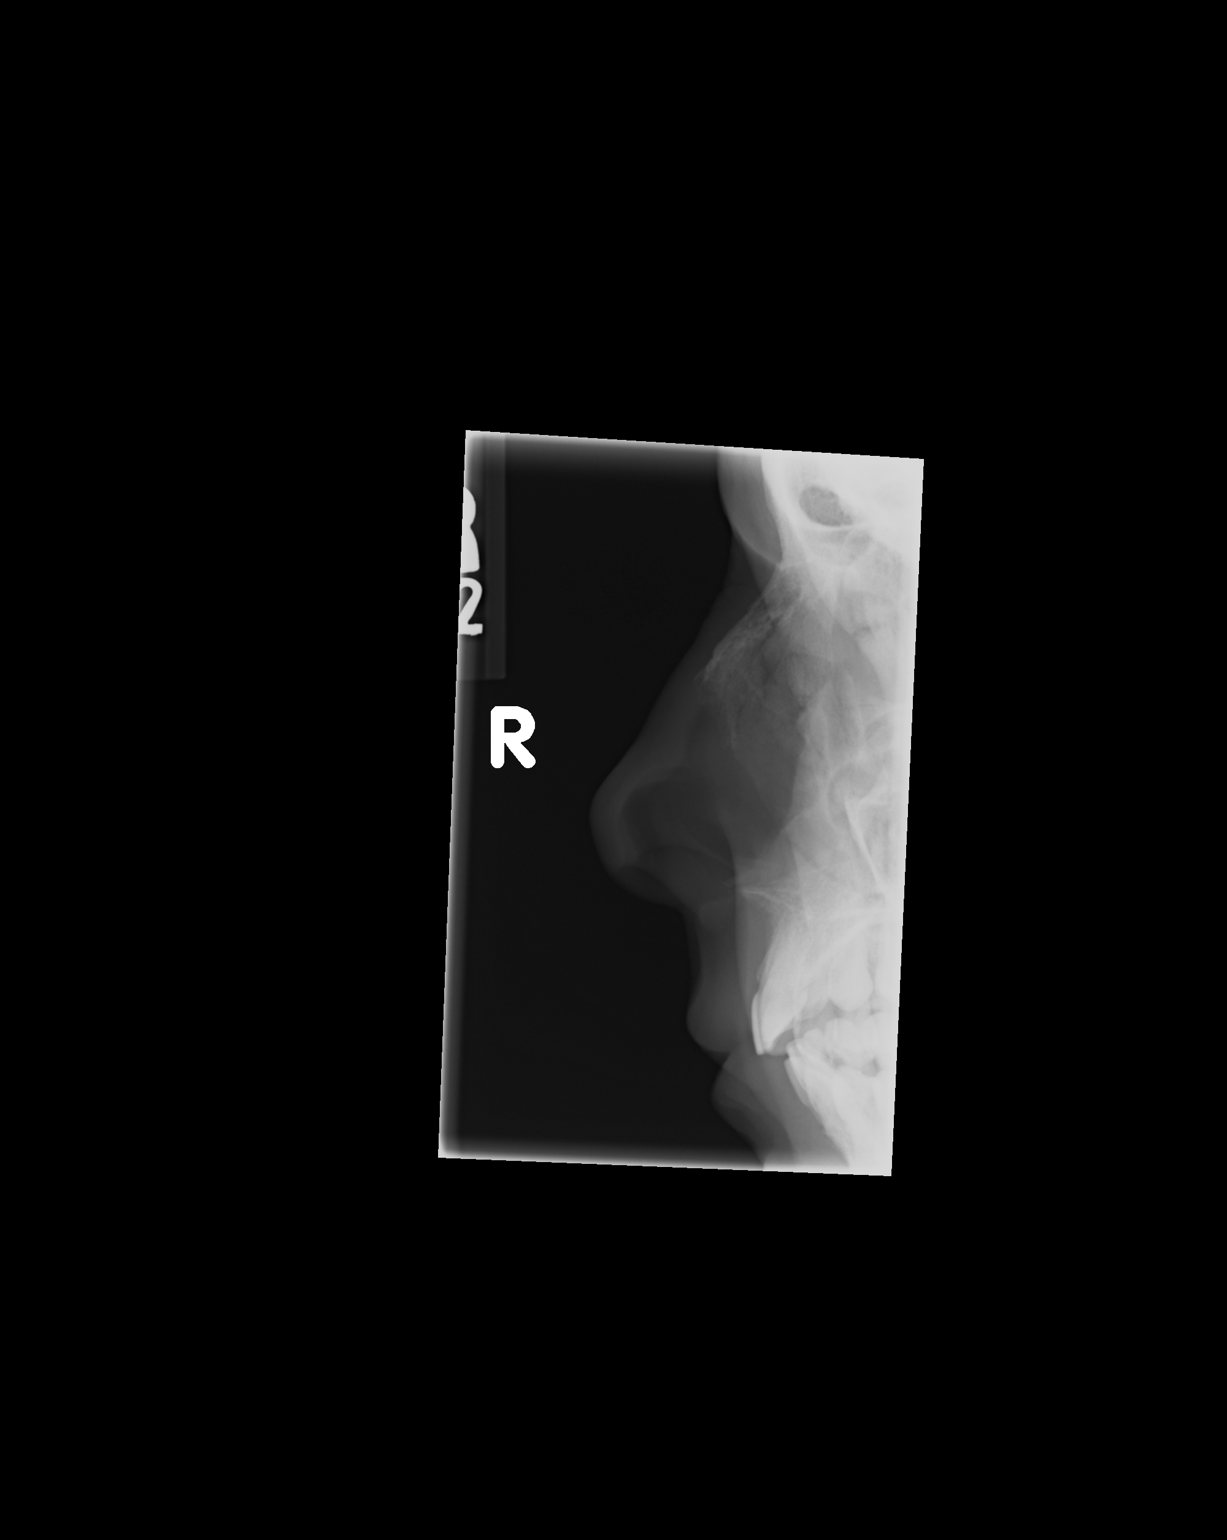

[view not recorded (3 of 3)]
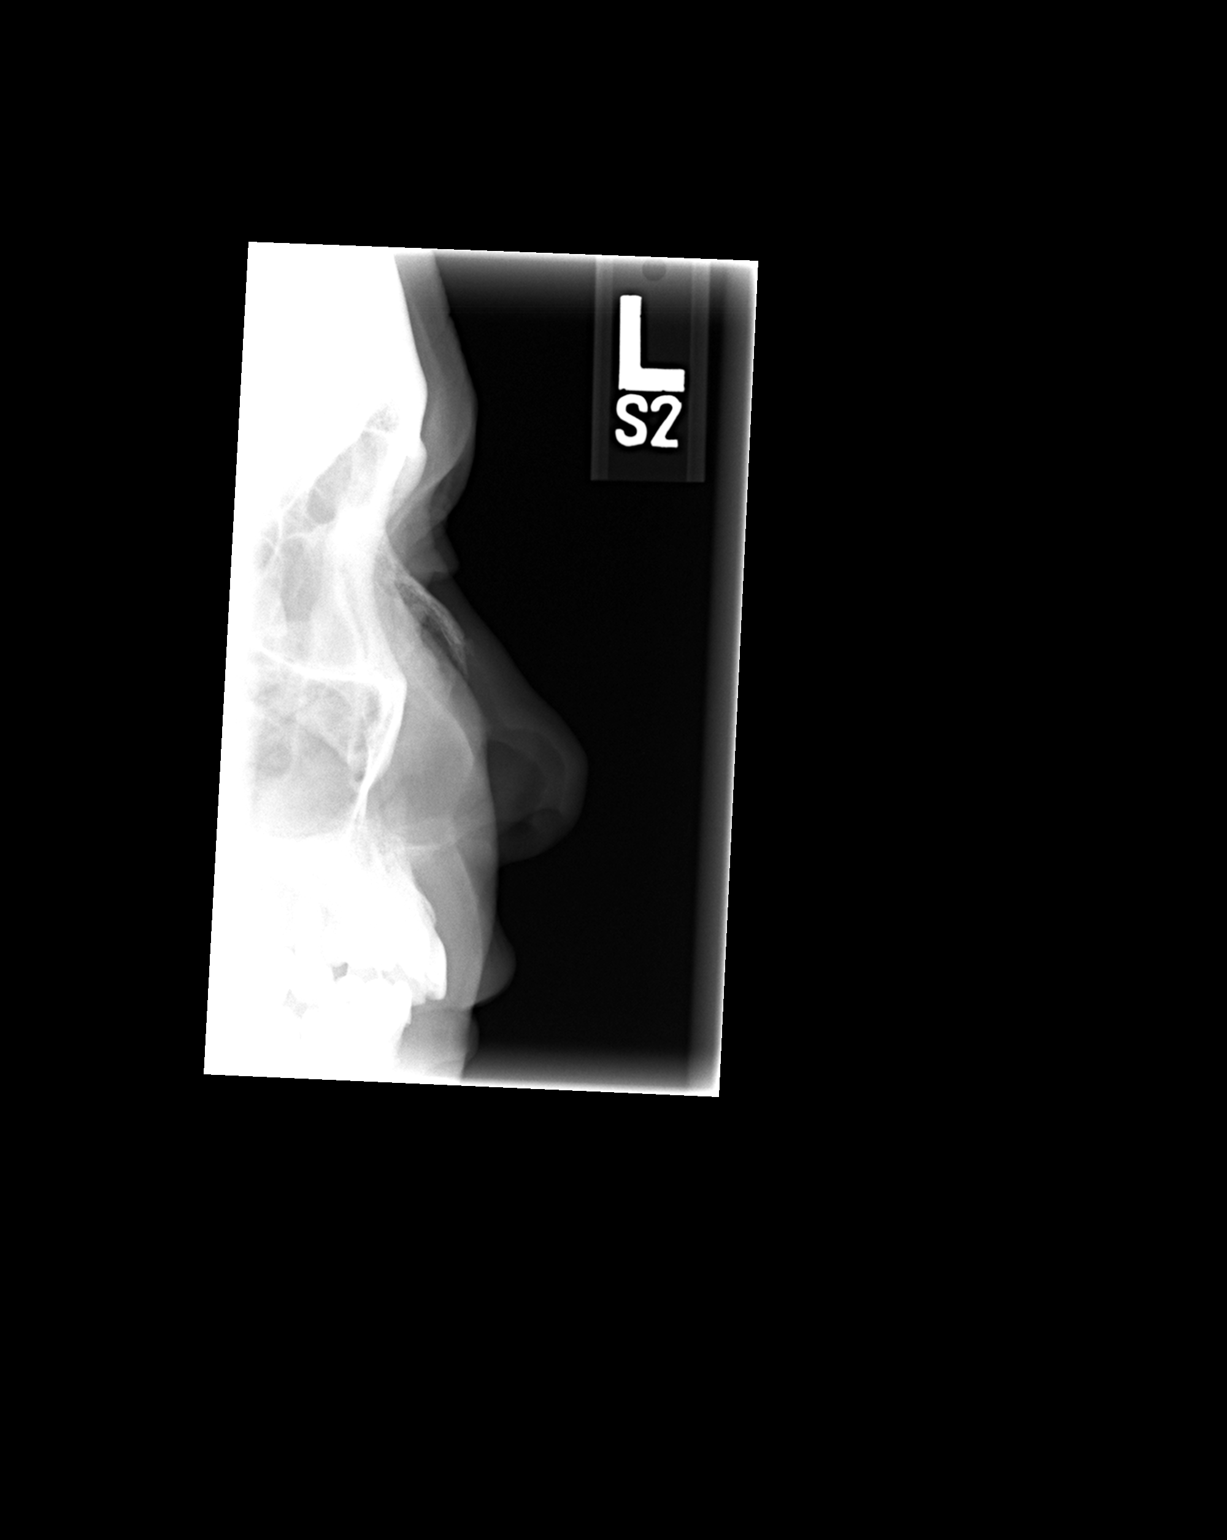

[3 of 3 positions shown; findings below may reference images not displayed]

FINDINGS: There is some indistinctness and irregularity anteriorly along the nasal bone suspicious for nondisplaced fracture.
IMPRESSION: Irregularity of the nasal bones anteriorly suspicious for nondisplaced fracture. 
 RIGHT HAND - 3 VIEW:
FINDINGS: There is some loss of articular space in the distal interphalangeal joints suggesting osteoarthritis.  No visible fracture or acute bony findings.
IMPRESSION: Osteoarthritis.  No acute bony findings.

## 2005-08-15 IMAGING — CR DG KNEE COMPLETE 4+V*L*
4 series · 4 of 4 positions shown · non-contrast
Comparison: none

CLINICAL DATA: Fall with abrasions to the knees. 
 RIGHT KNEE - 4 VIEW:
 No prior studies.

[t knee ap left]
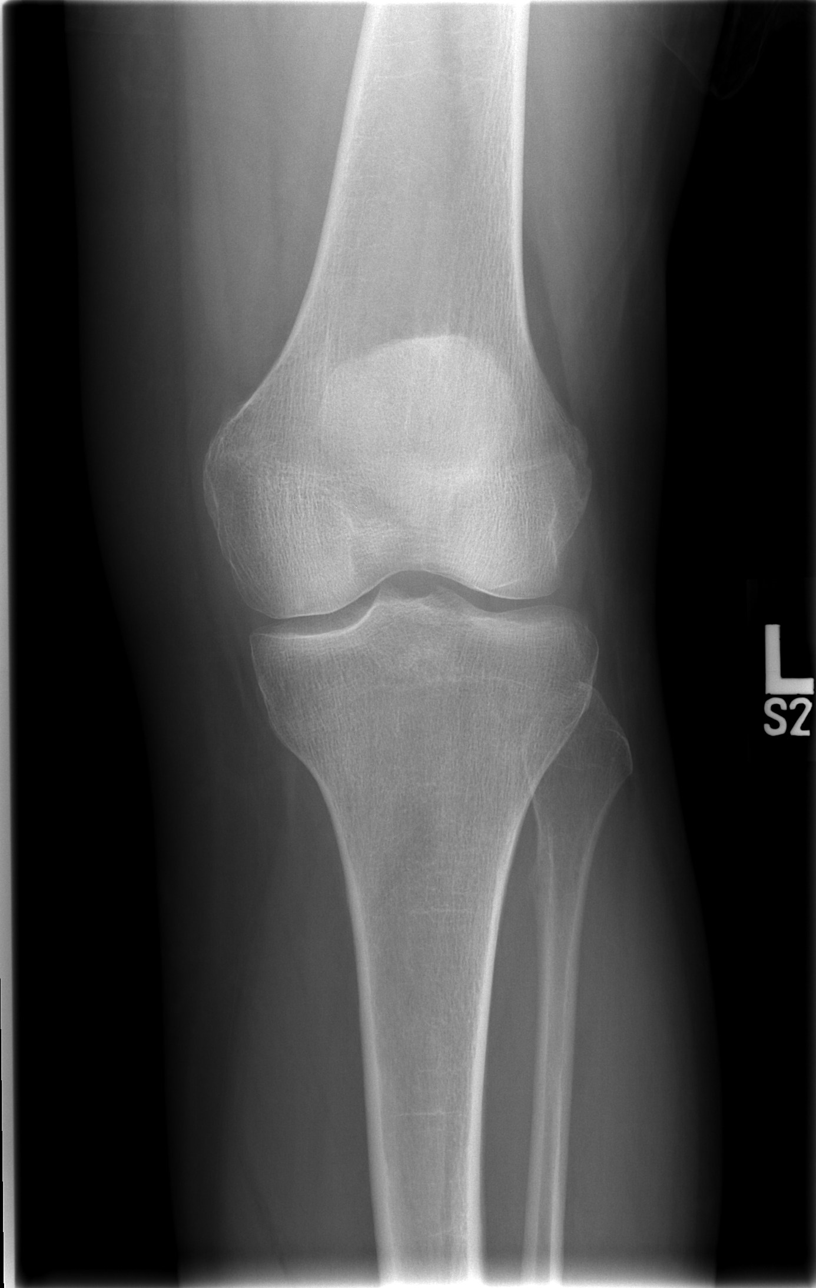

[t knee oblique left (1 of 2)]
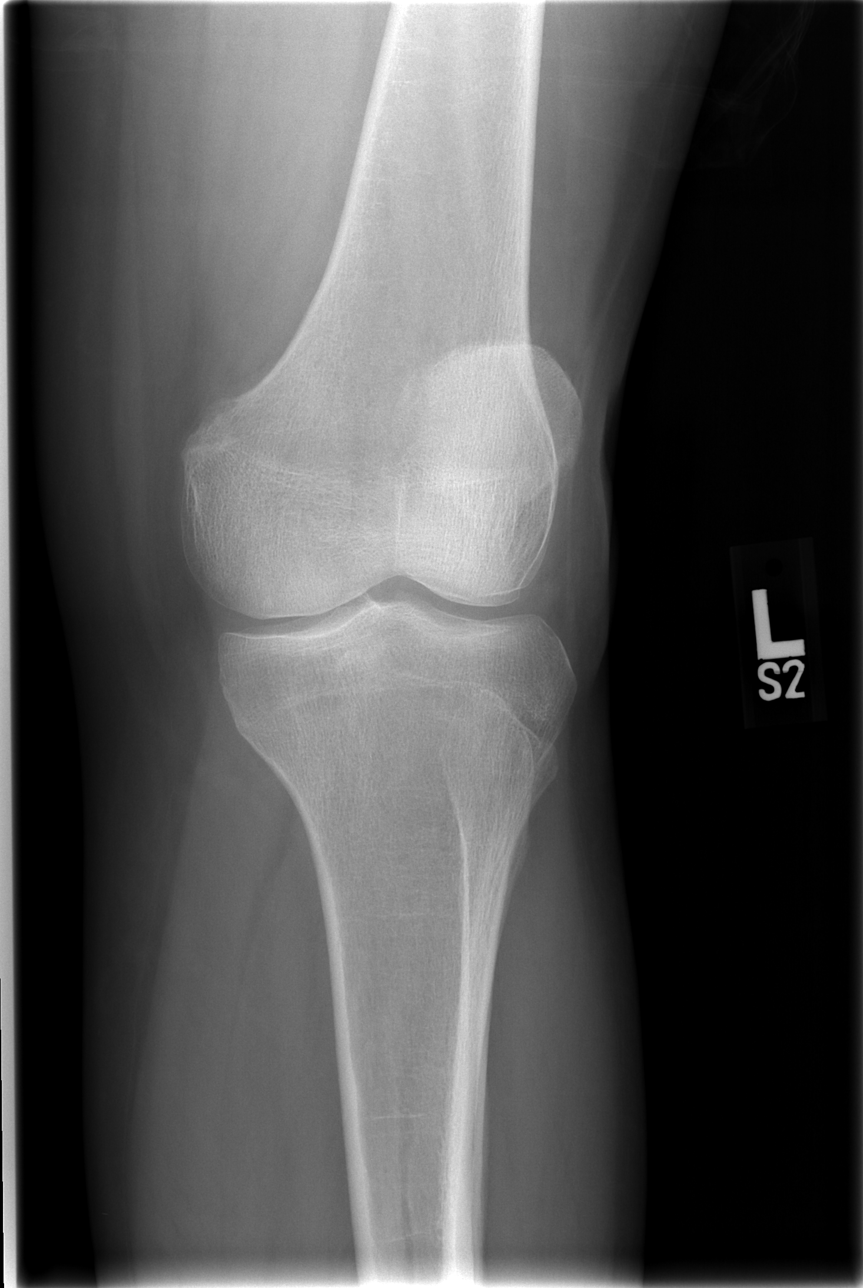

[t knee oblique left (2 of 2)]
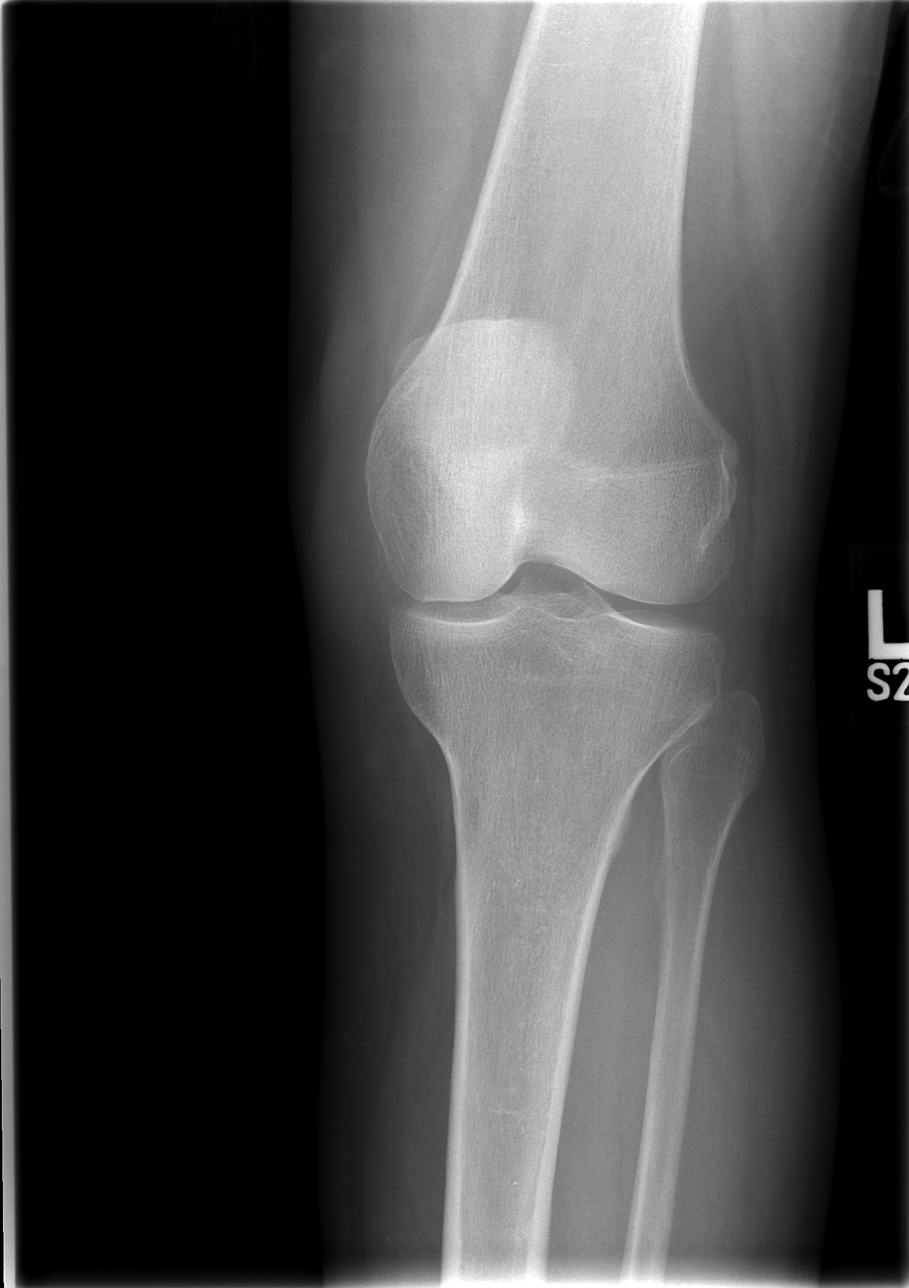

[t knee lat left]
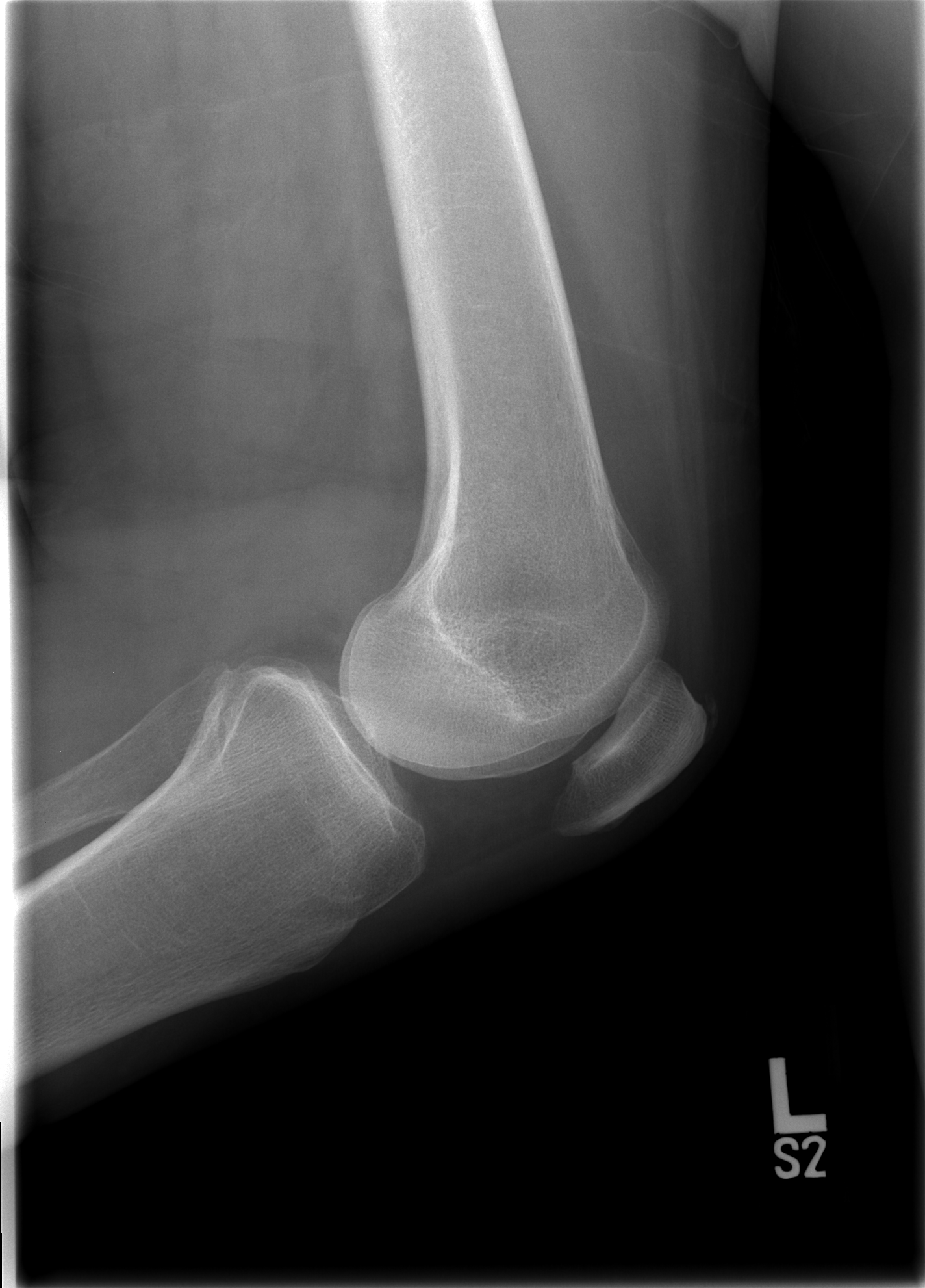

[4 of 4 positions shown; findings below may reference images not displayed]

FINDINGS: No visible fracture or effusion.  There is mild spurring at the quadriceps insertion site on the patella.
IMPRESSION: No acute radiographic findings. 
 LEFT KNEE - 4 VIEW:
FINDINGS: Mild spurring at the quadriceps insertion site is noted on the left side.  No visible fracture or effusion.
IMPRESSION: No acute bony findings.

## 2005-09-24 ENCOUNTER — Encounter: Admission: RE | Admit: 2005-09-24 | Discharge: 2005-12-23 | Payer: Self-pay | Admitting: Orthopedic Surgery

## 2006-03-14 ENCOUNTER — Emergency Department (HOSPITAL_COMMUNITY): Admission: EM | Admit: 2006-03-14 | Discharge: 2006-03-14 | Payer: Self-pay | Admitting: Emergency Medicine

## 2006-03-14 IMAGING — CT CT PELVIS W/O CM
1 series · 15 of 32 positions shown, 19 images · non-contrast
Comparison: none

CLINICAL DATA: Left flank pain.
 ABDOMEN CT WITHOUT CONTRAST ? [DATE] AT [H5] HOURS:
TECHNIQUE: Multidetector CT imaging of the abdomen was performed following the standard protocol without IV contrast.
TECHNIQUE: Multidetector CT imaging of the pelvis was performed following the standard protocol during bolus administration of intravenous contrast.

[Series 2: renal stone · axial · 0.78mm/px · z∈[-438,-42]mm · 15 of 88 slices shown, 19 images]
[im 6/88  soft-tissue]
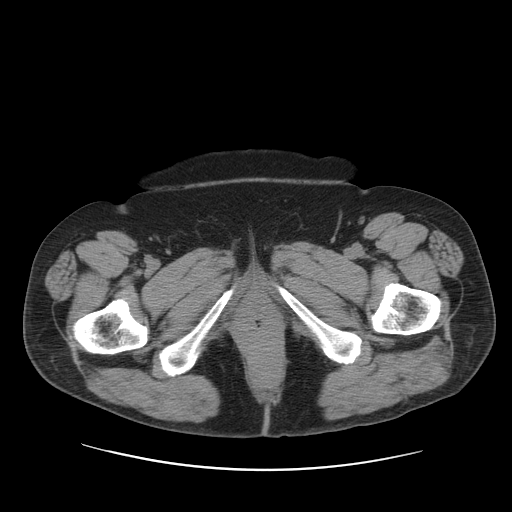
[im 6/88  bone]
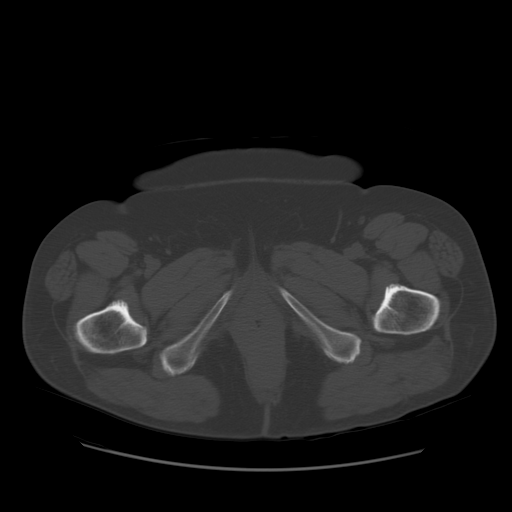
[im 12/88  soft-tissue]
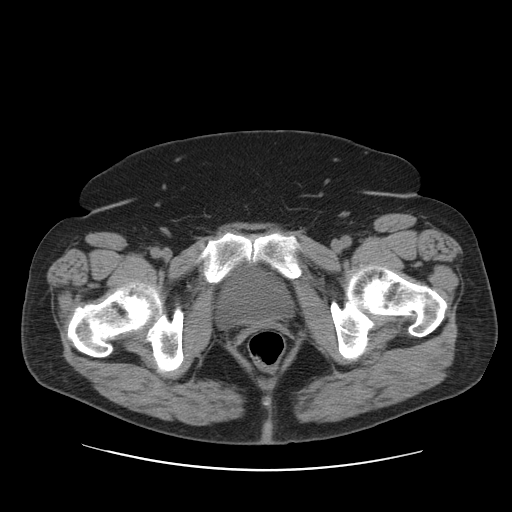
[im 17/88  soft-tissue]
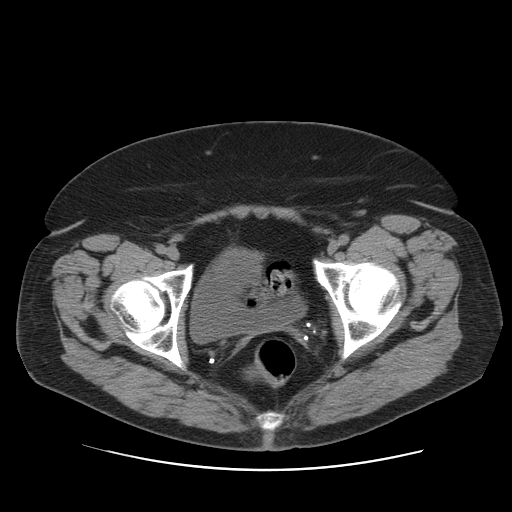
[im 26/88  soft-tissue]
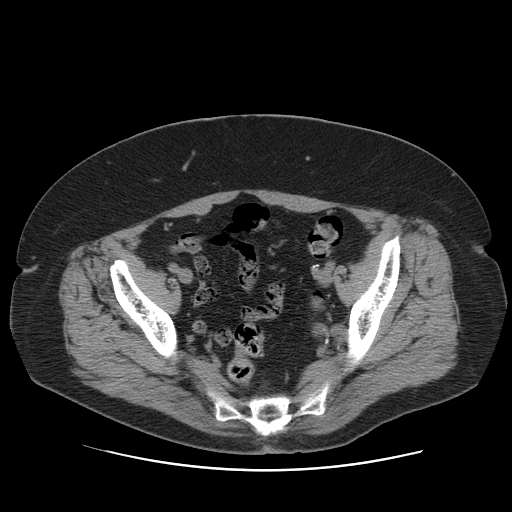
[im 31/88  soft-tissue]
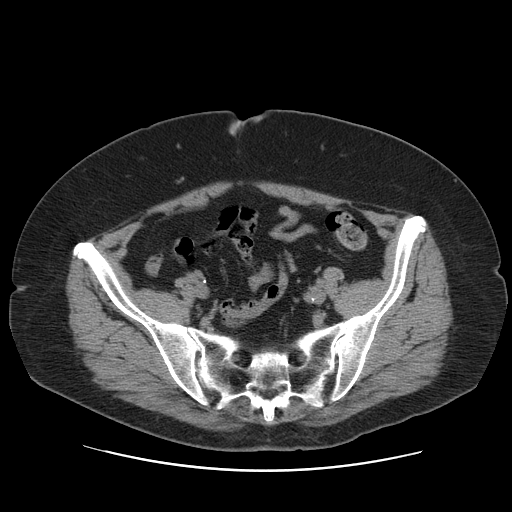
[im 37/88  soft-tissue]
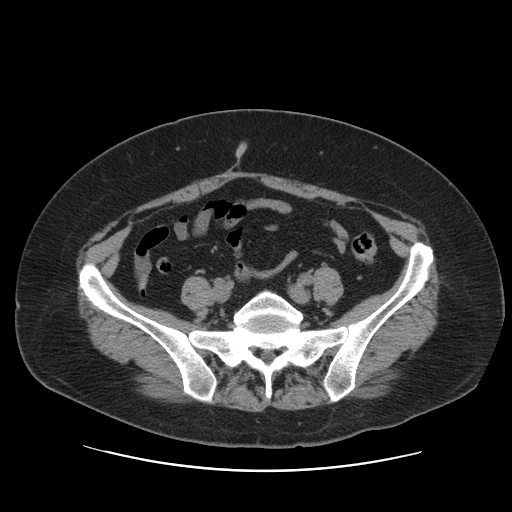
[im 45/88  soft-tissue]
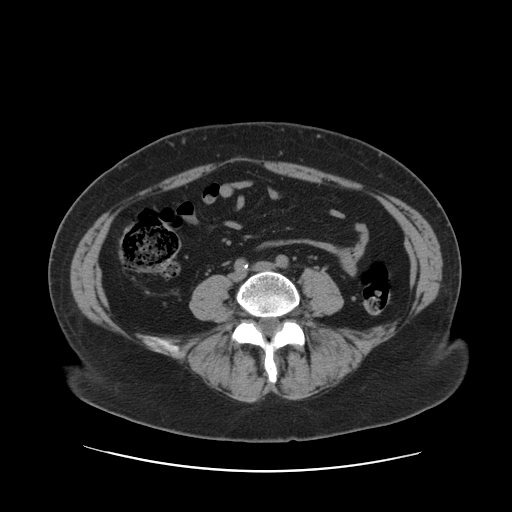
[im 51/88  soft-tissue]
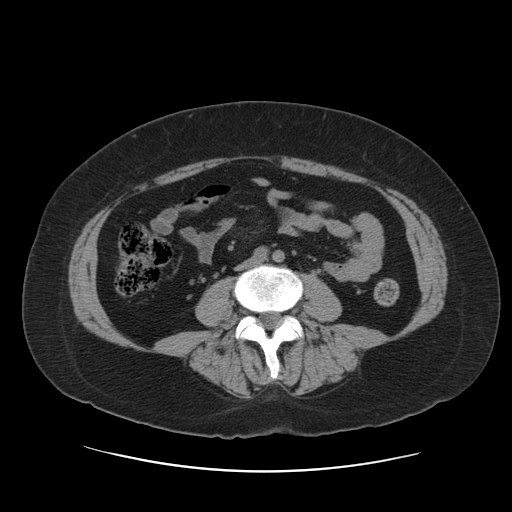
[im 57/88  soft-tissue]
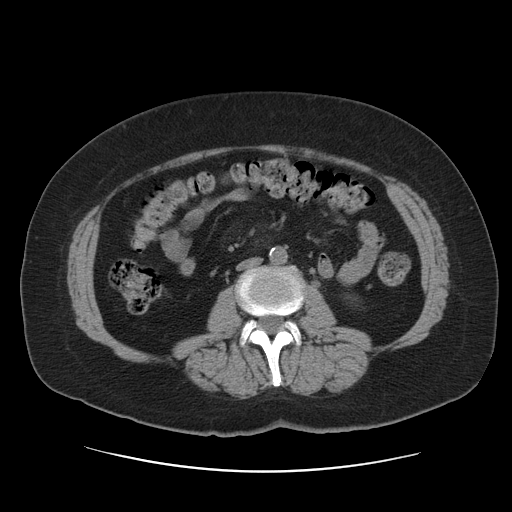
[im 57/88  bone]
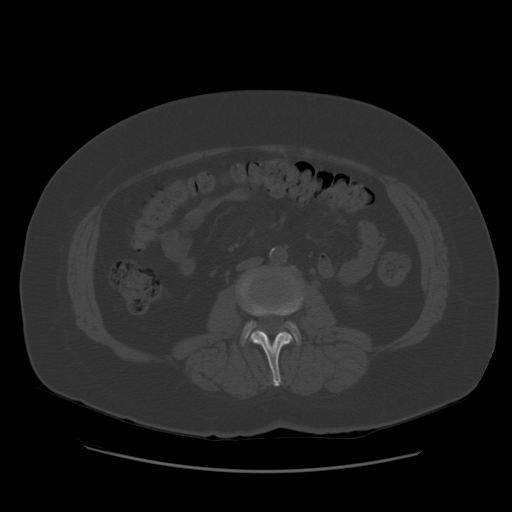
[im 62/88  soft-tissue]
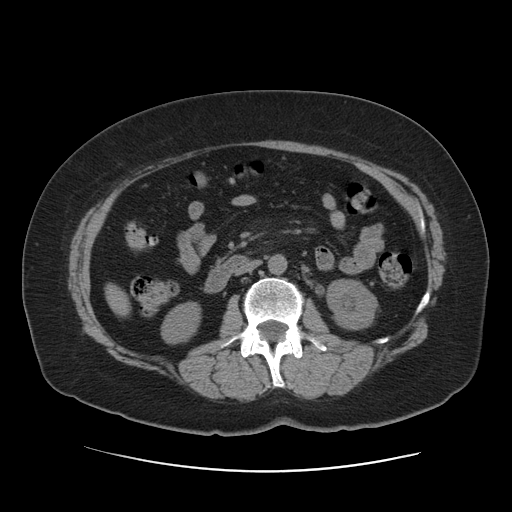
[im 71/88  soft-tissue]
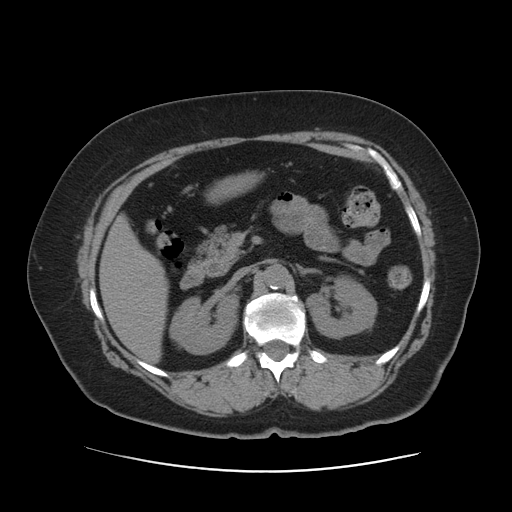
[im 76/88  soft-tissue]
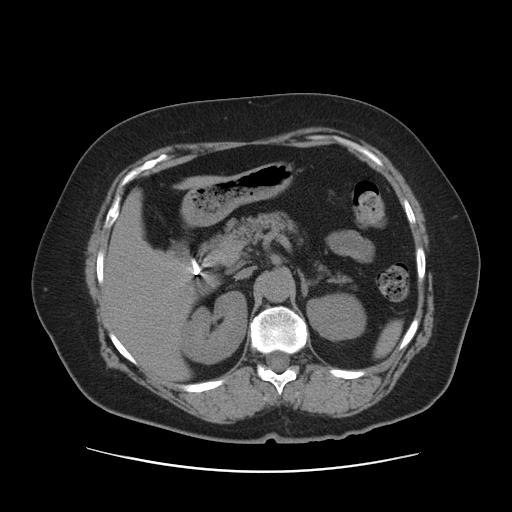
[im 76/88  lung]
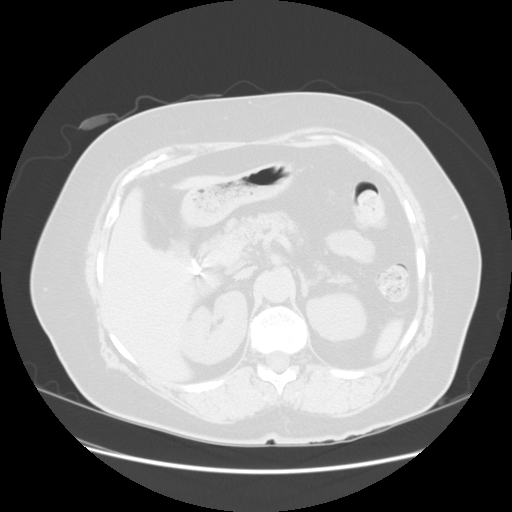
[im 79/88  lung]
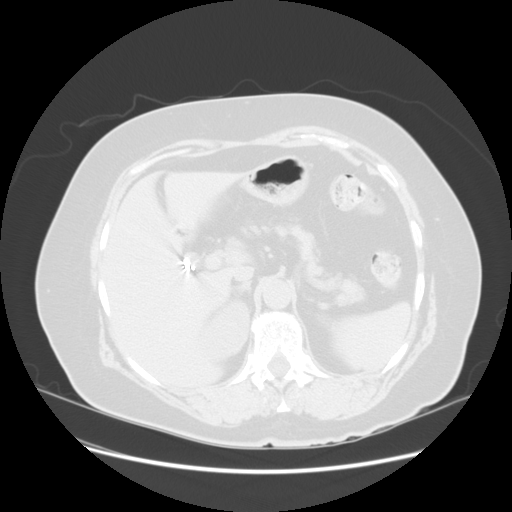
[im 82/88  soft-tissue]
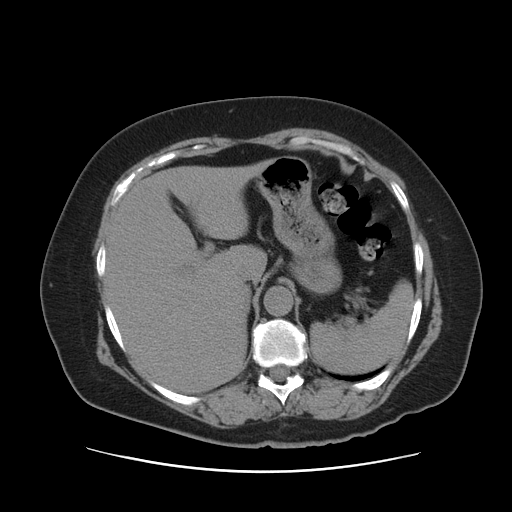
[im 82/88  lung]
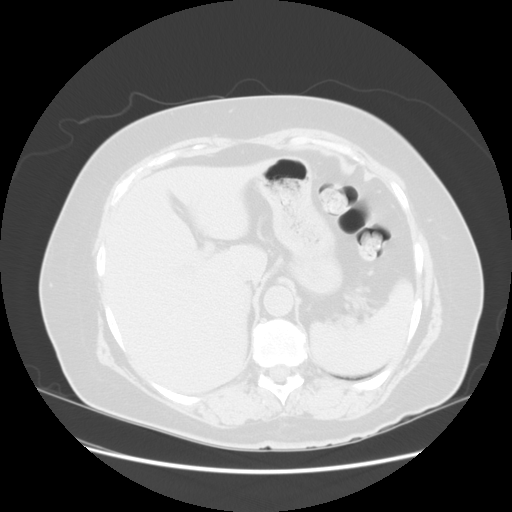
[im 85/88  lung]
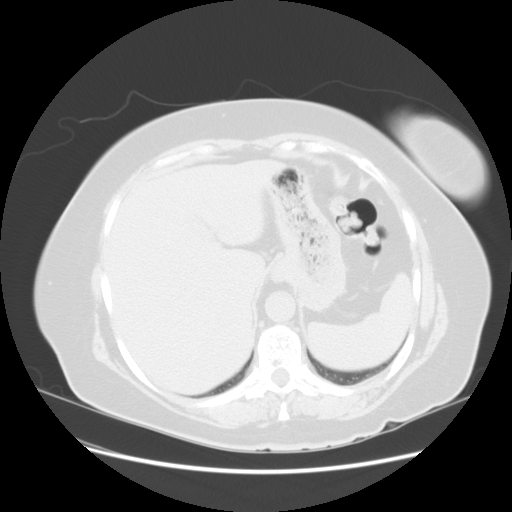

[15 of 32 positions shown; findings below may reference images not displayed]

FINDINGS: Mild left hydronephrosis and perinephric stranding is seen.  Mild dilatation of the left ureter is present to the mid left ureter on image #41.  Here, there is a 3 mm ureteral calculus on the left.  This is at the L4 vertebral body level.  A small calculus is present in the lower pole of the right kidney.  There are no left renal calculi.  
 The gallbladder is surgically absent.  The visualized liver, spleen, pancreas, and adrenal glands are within normal limits.  Negative free fluid or abnormal adenopathy.
IMPRESSION: 1.  3 mm mid left ureteral calculus with secondary findings of left ureteral obstruction.
 2.  Right nephrolithiasis.
 3.  Postoperative changes.
FINDINGS: The bladder is within normal limits.  Multiple phleboliths are seen.  The appendix is normal.  Negative free fluid or abnormal adenopathy.
IMPRESSION: No urinary calculus in the pelvis.  Normal appendix.

## 2006-05-07 ENCOUNTER — Emergency Department (HOSPITAL_COMMUNITY): Admission: EM | Admit: 2006-05-07 | Discharge: 2006-05-08 | Payer: Self-pay | Admitting: Emergency Medicine

## 2006-05-07 IMAGING — CT CT PELVIS W/O CM
2 of 4 series · 14 of 32 positions shown, 19 images · non-contrast
Comparison: none

CLINICAL DATA: Left flank pain, hematuria

ABDOMEN CT WITHOUT CONTRAST - URINARY STONE PROTOCOL
TECHNIQUE: Multidetector CT imaging of the abdomen was performed following the
urinary stone protocol.  No oral or intravenous contrast was administered.
TECHNIQUE: Multidetector CT imaging of the pelvis was performed following the

[Series 2: routine abdomen · axial · 0.71mm/px · z∈[-388,-43]mm · 6 of 97 slices shown, 11 images]
[im 14/97  soft-tissue]
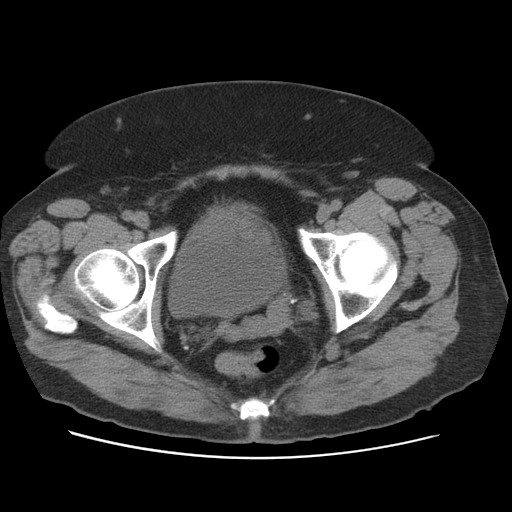
[im 14/97  bone]
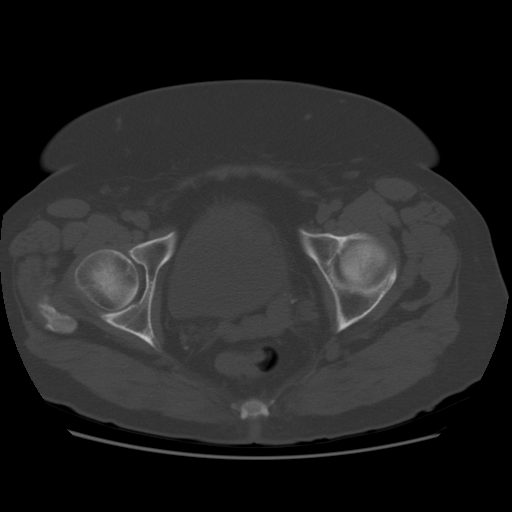
[im 28/97  soft-tissue]
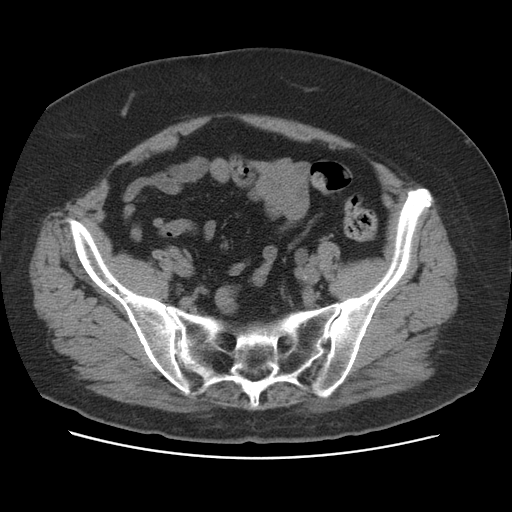
[im 42/97  soft-tissue]
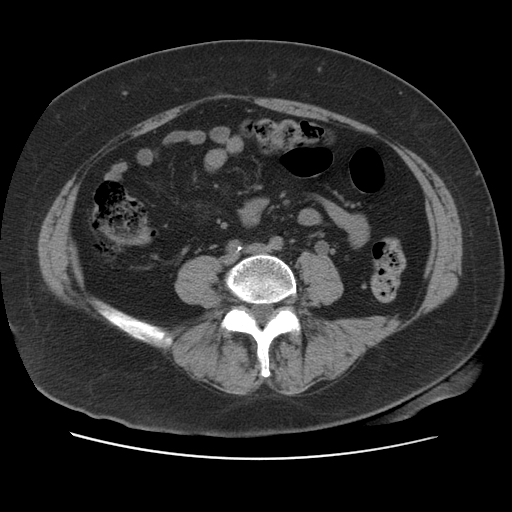
[im 42/97  lung]
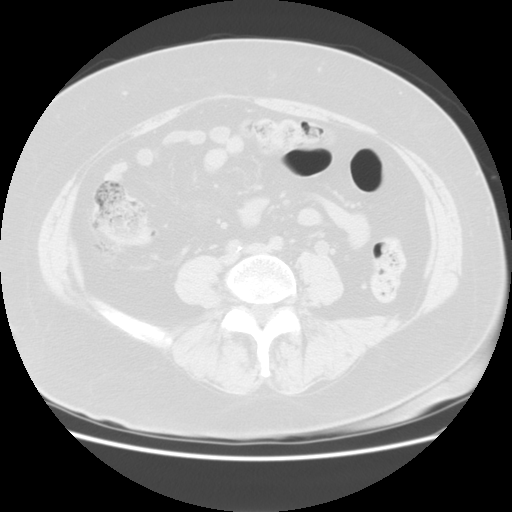
[im 55/97  soft-tissue]
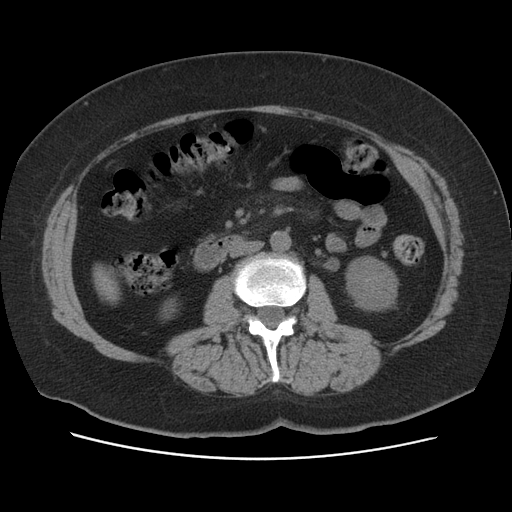
[im 55/97  lung]
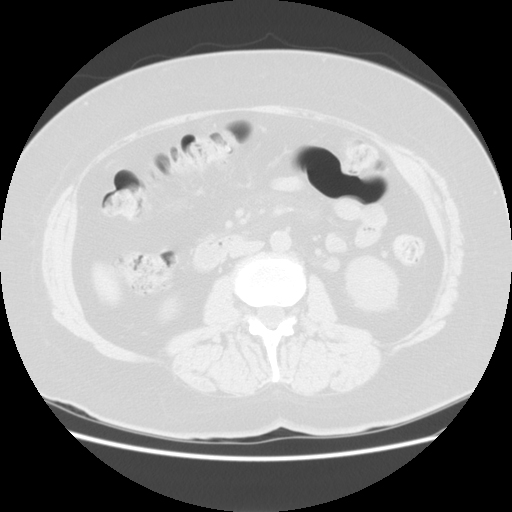
[im 69/97  soft-tissue]
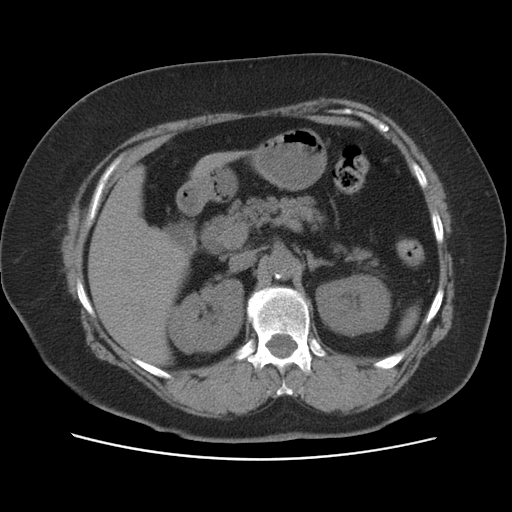
[im 69/97  lung]
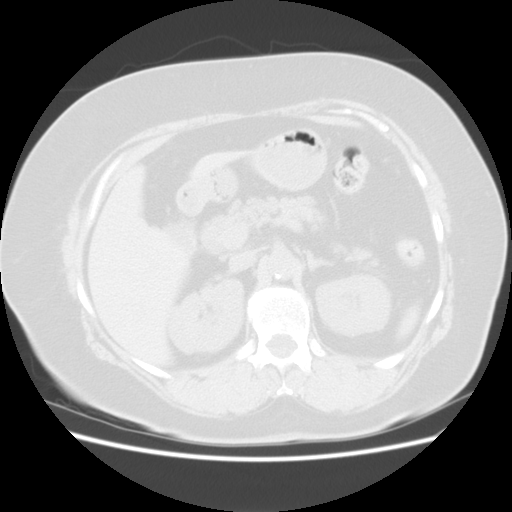
[im 83/97  soft-tissue]
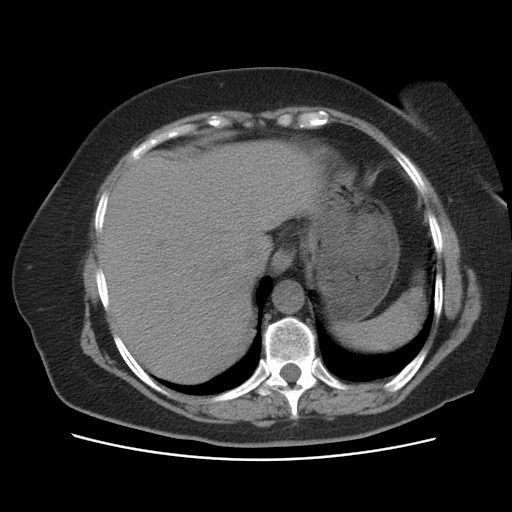
[im 83/97  lung]
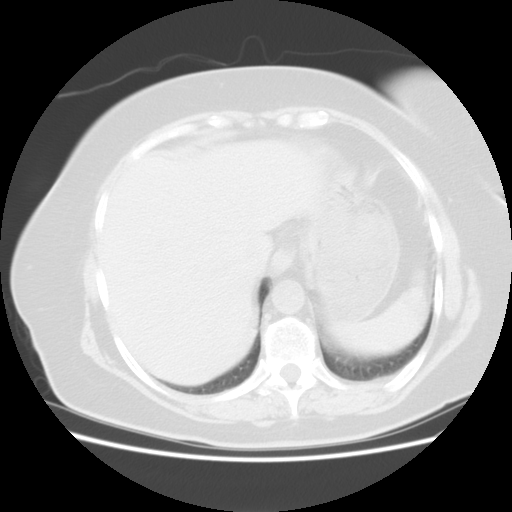

[Series 400: reformatted · sagittal · 1.00mm/px · 8 of 163 slices shown]
[im 15/163  soft-tissue]
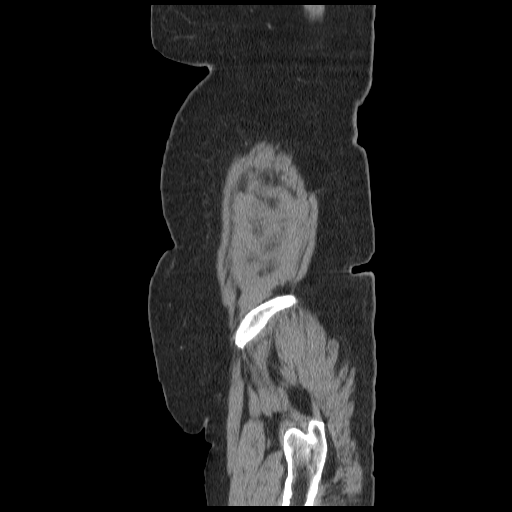
[im 30/163  soft-tissue]
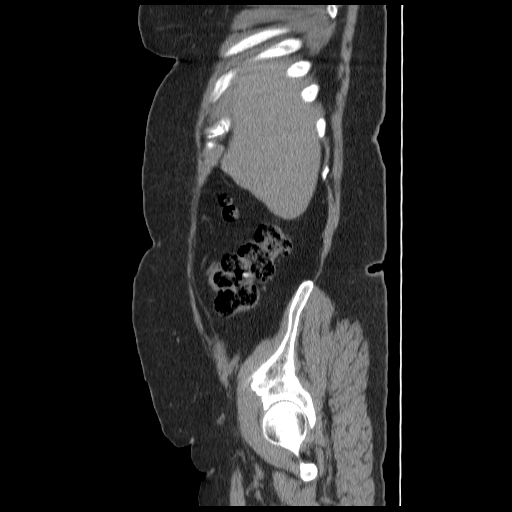
[im 59/163  soft-tissue]
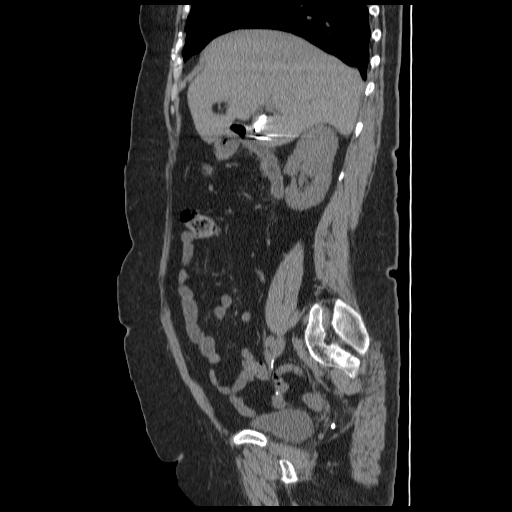
[im 74/163  soft-tissue]
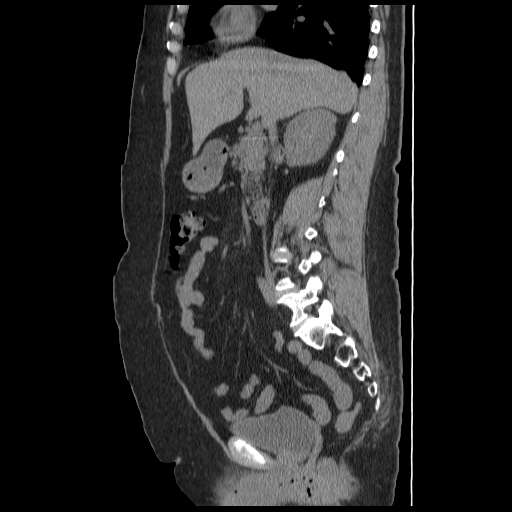
[im 89/163  soft-tissue]
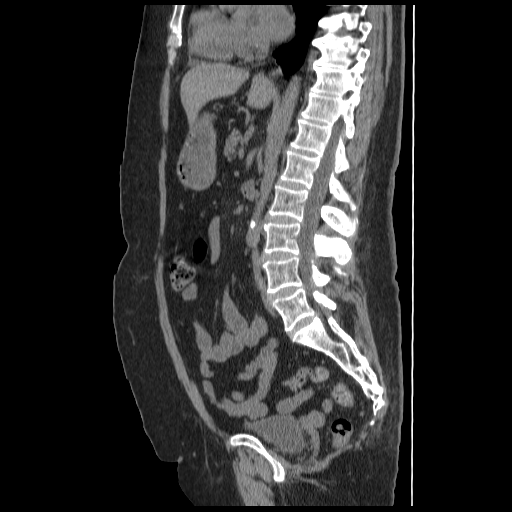
[im 104/163  soft-tissue]
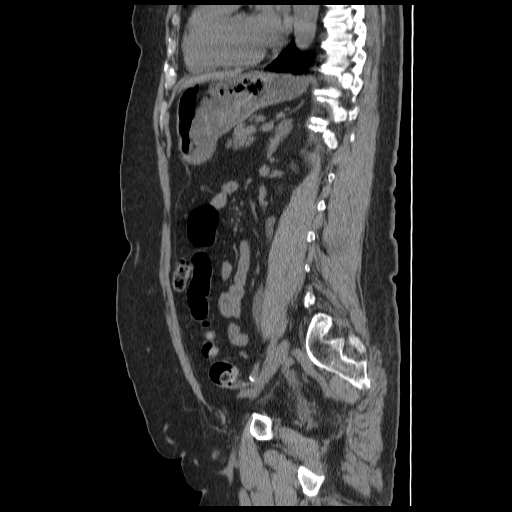
[im 133/163  soft-tissue]
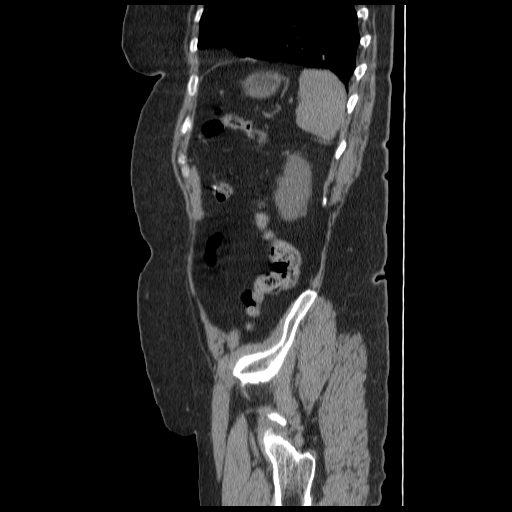
[im 148/163  soft-tissue]
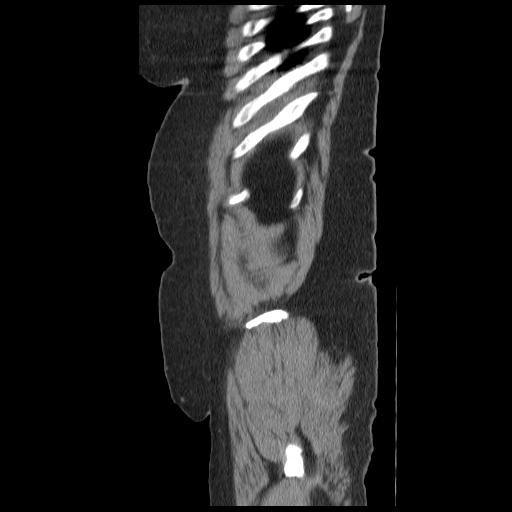

[14 of 32 positions shown; findings below may reference images not displayed]

FINDINGS: There is moderate left hydroureteronephrosis. Previously noted mid
left ureteral calculus has migrated. See pelvic CT report below. Small
nonobstructing stone in the lower pole of the right kidney, unchanged.

Patient status post cholecystectomy. Liver, spleen, pancreas, adrenals
unremarkable. There is stable mild haziness within the mesentery of unknown
etiology or significance. No adenopathy.

IMPRESSION

Moderate left hydroureteronephrosis.

PELVIS CT WITHOUT CONTRAST - URINARY STONE PROTOCOL
FINDINGS: There appeared to be 2 stones at the left UVJ, both approximately 3
mm in size. Right ureters decompressed. Appendix normal. No free fluid, free
air, or adenopathy. Patient status post hysterectomy.

IMPRESSION

There are likely 2 small left UVJ stones causing obstruction.

## 2007-01-02 ENCOUNTER — Emergency Department (HOSPITAL_COMMUNITY): Admission: EM | Admit: 2007-01-02 | Discharge: 2007-01-02 | Payer: Self-pay | Admitting: Emergency Medicine

## 2007-01-02 IMAGING — CR DG CHEST 1V PORT
1 series · 1 of 1 positions shown · non-contrast
Comparison: [DATE].

CLINICAL DATA: Chest pain radiating to the left arm, hypertension. 
 PORTABLE CHEST - 1 VIEW:

[view not recorded]
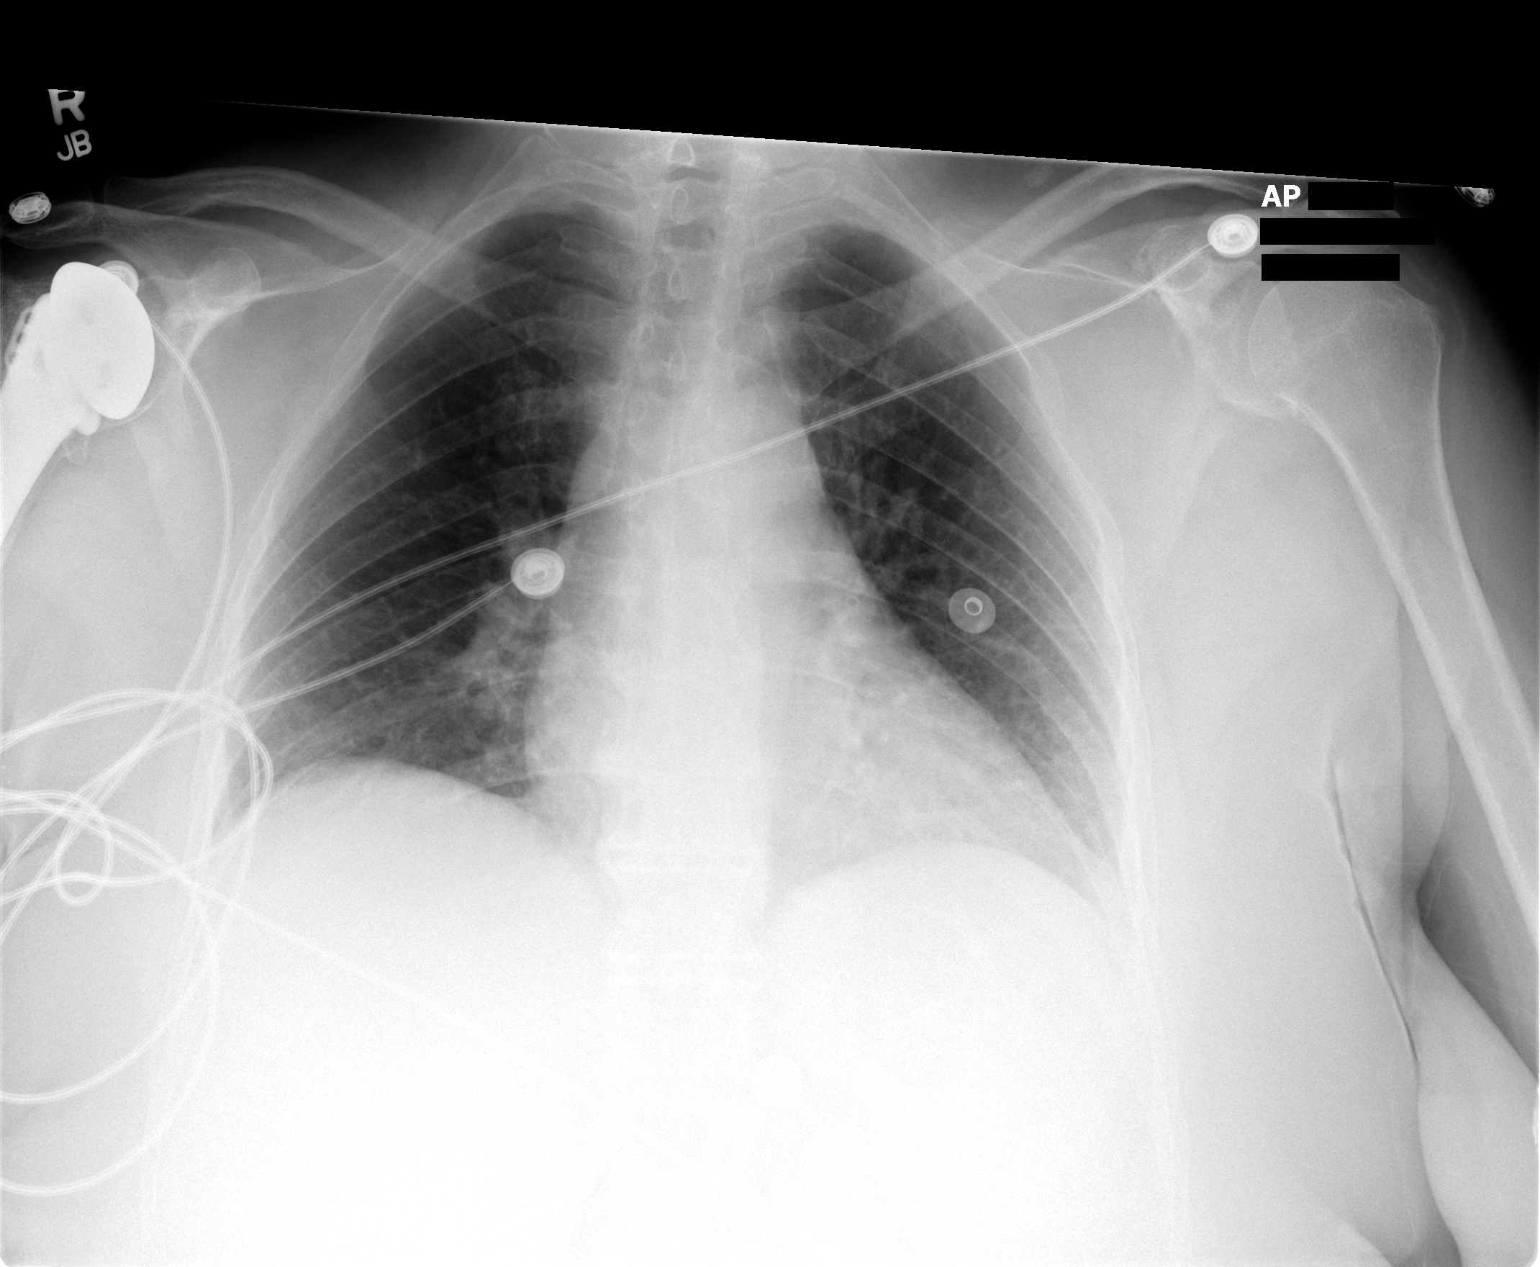

[1 of 1 positions shown; findings below may reference images not displayed]

FINDINGS: Cardiac leads overlie the chest. Heart size is at the upper limits for normal. Lung volumes are low but clear. Right humeral hemiarthroplasty is noted.
IMPRESSION: Low lung volumes. No focal acute cardiopulmonary process.

## 2007-01-14 ENCOUNTER — Encounter: Admission: RE | Admit: 2007-01-14 | Discharge: 2007-01-14 | Payer: Self-pay | Admitting: Internal Medicine

## 2007-01-14 IMAGING — CR DG SHOULDER 2+V*L*
3 series · 3 of 3 positions shown · non-contrast
Comparison: None.

LEFT SHOULDER - 3  VIEW:

CLINICAL DATA: Left shoulder pain

[view not recorded (1 of 3)]
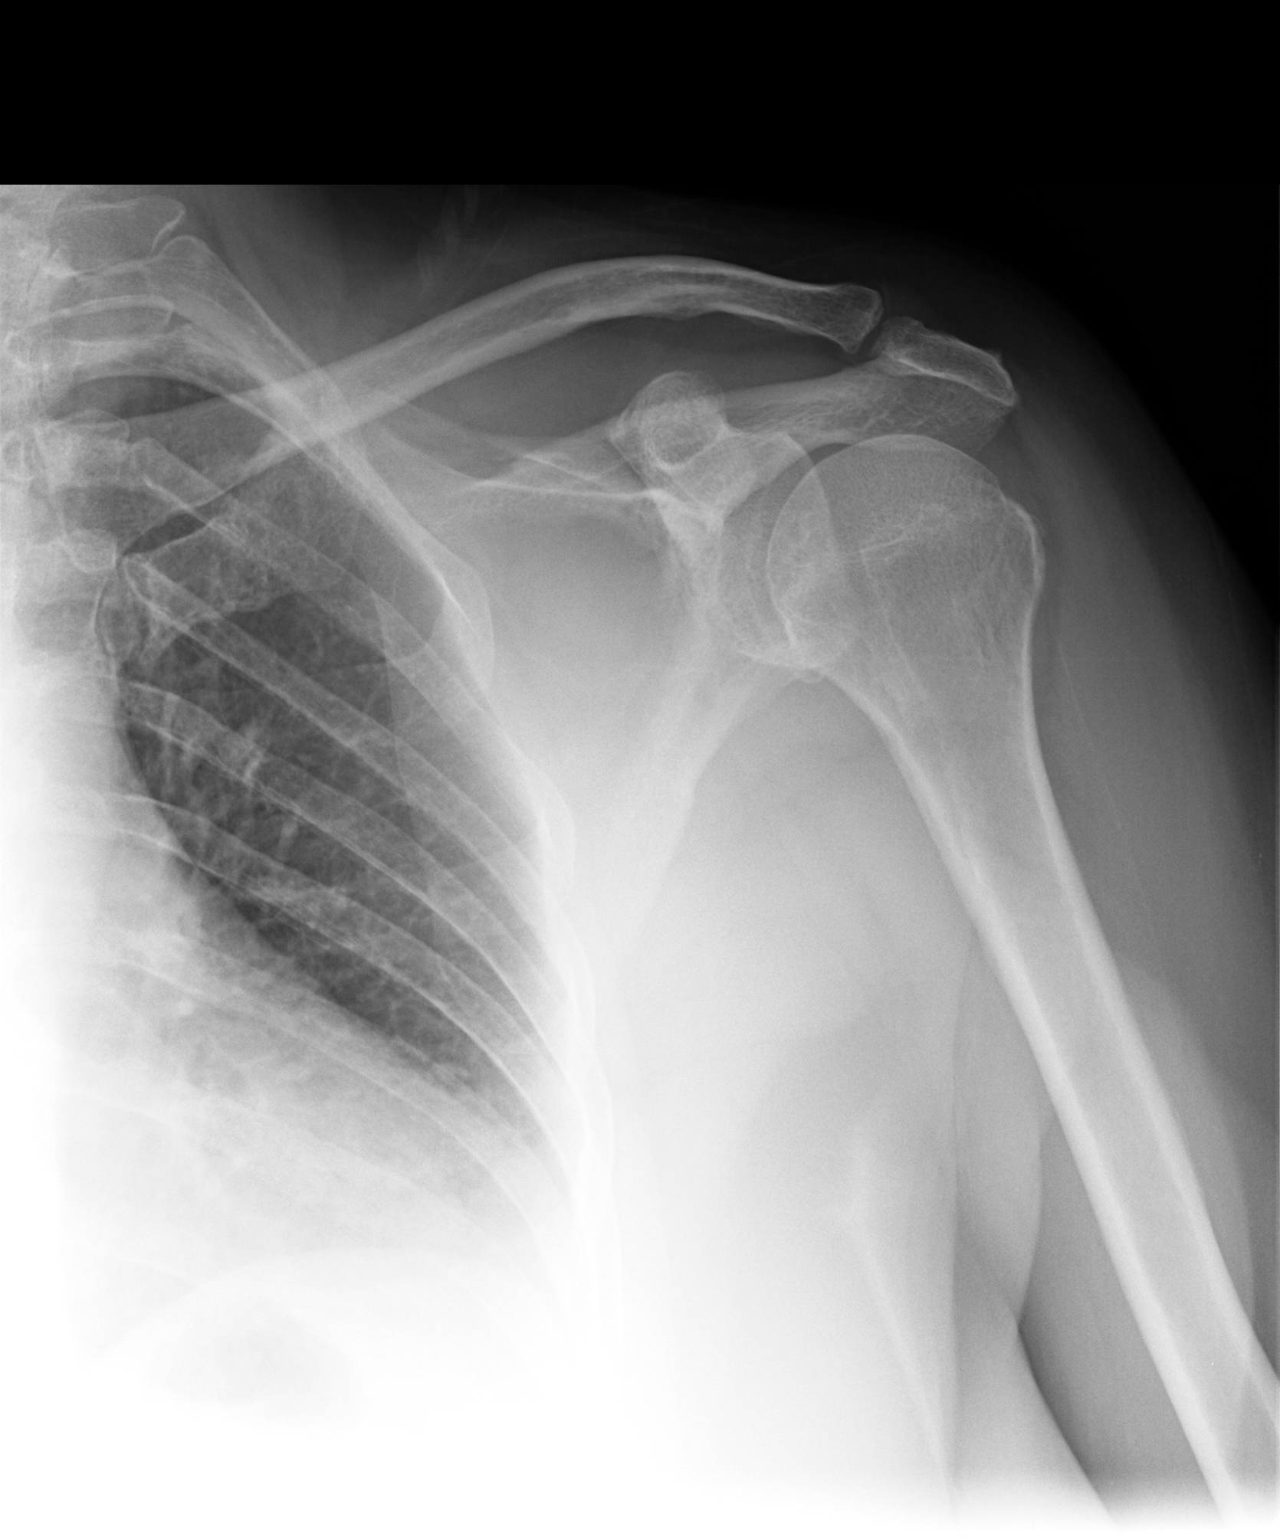

[view not recorded (2 of 3)]
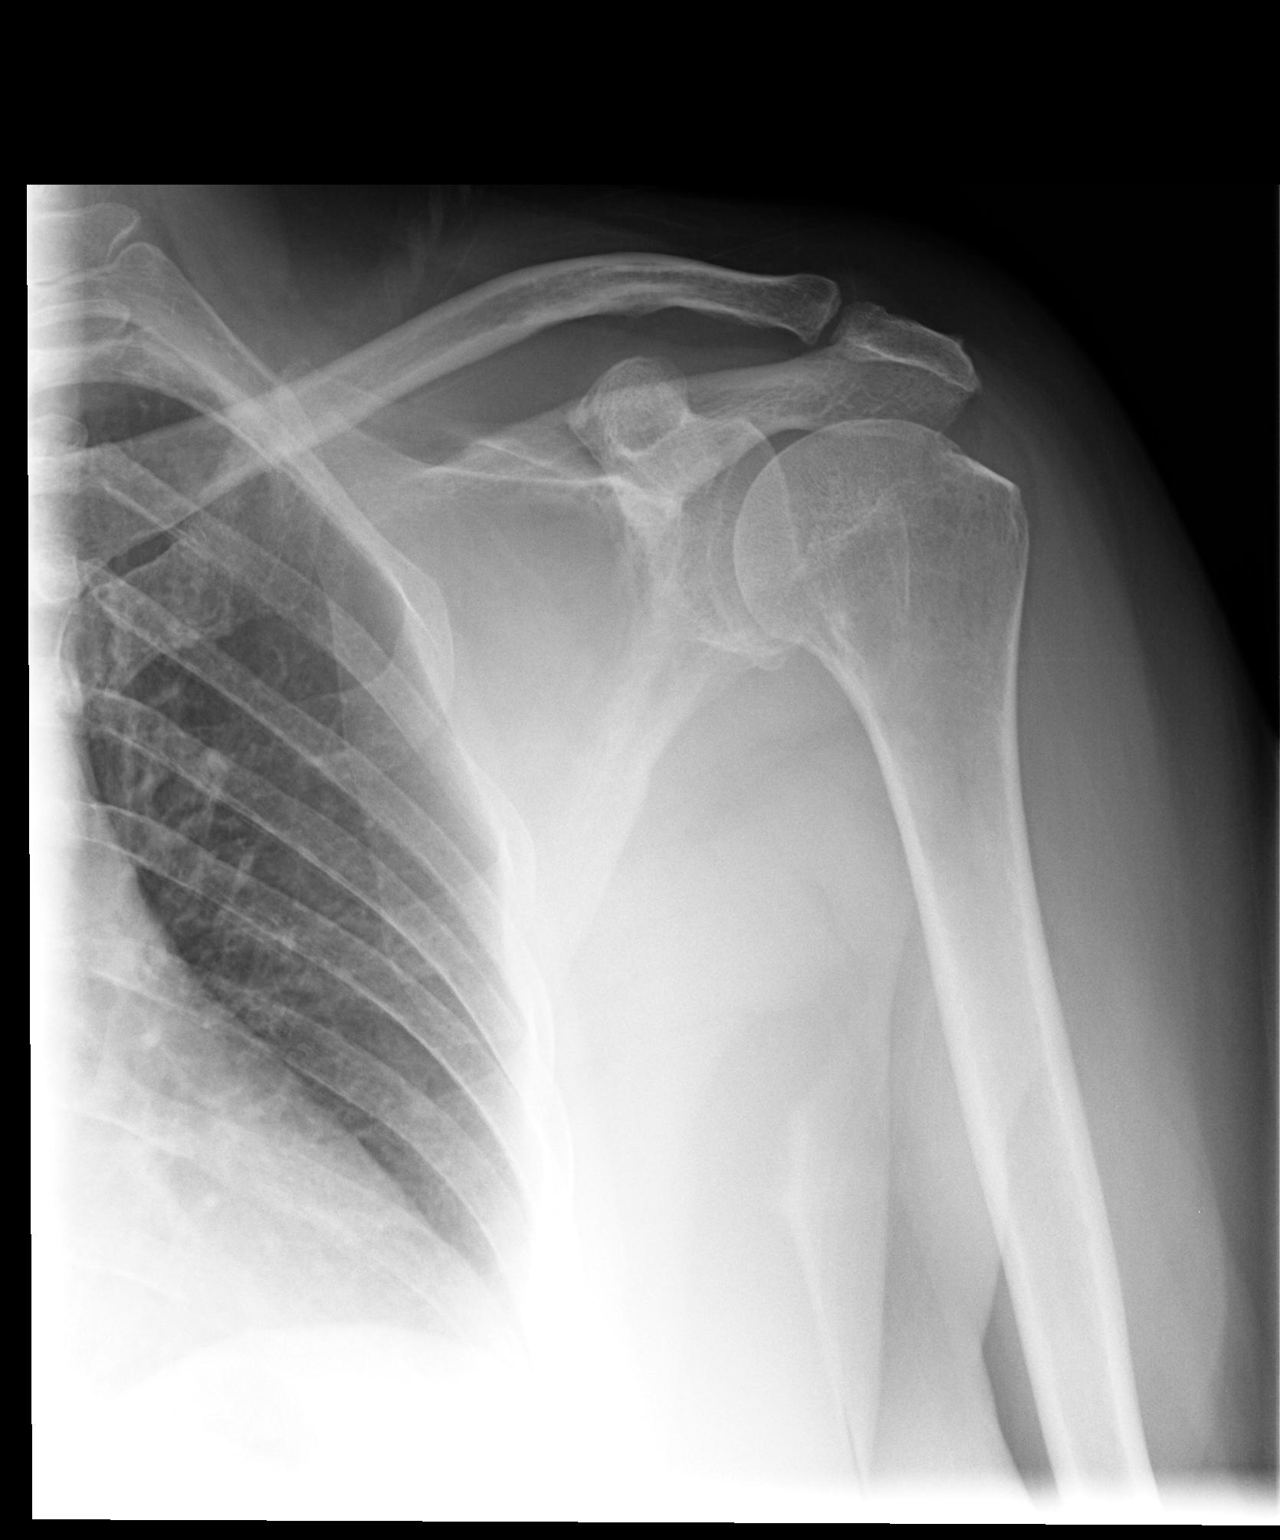

[view not recorded (3 of 3)]
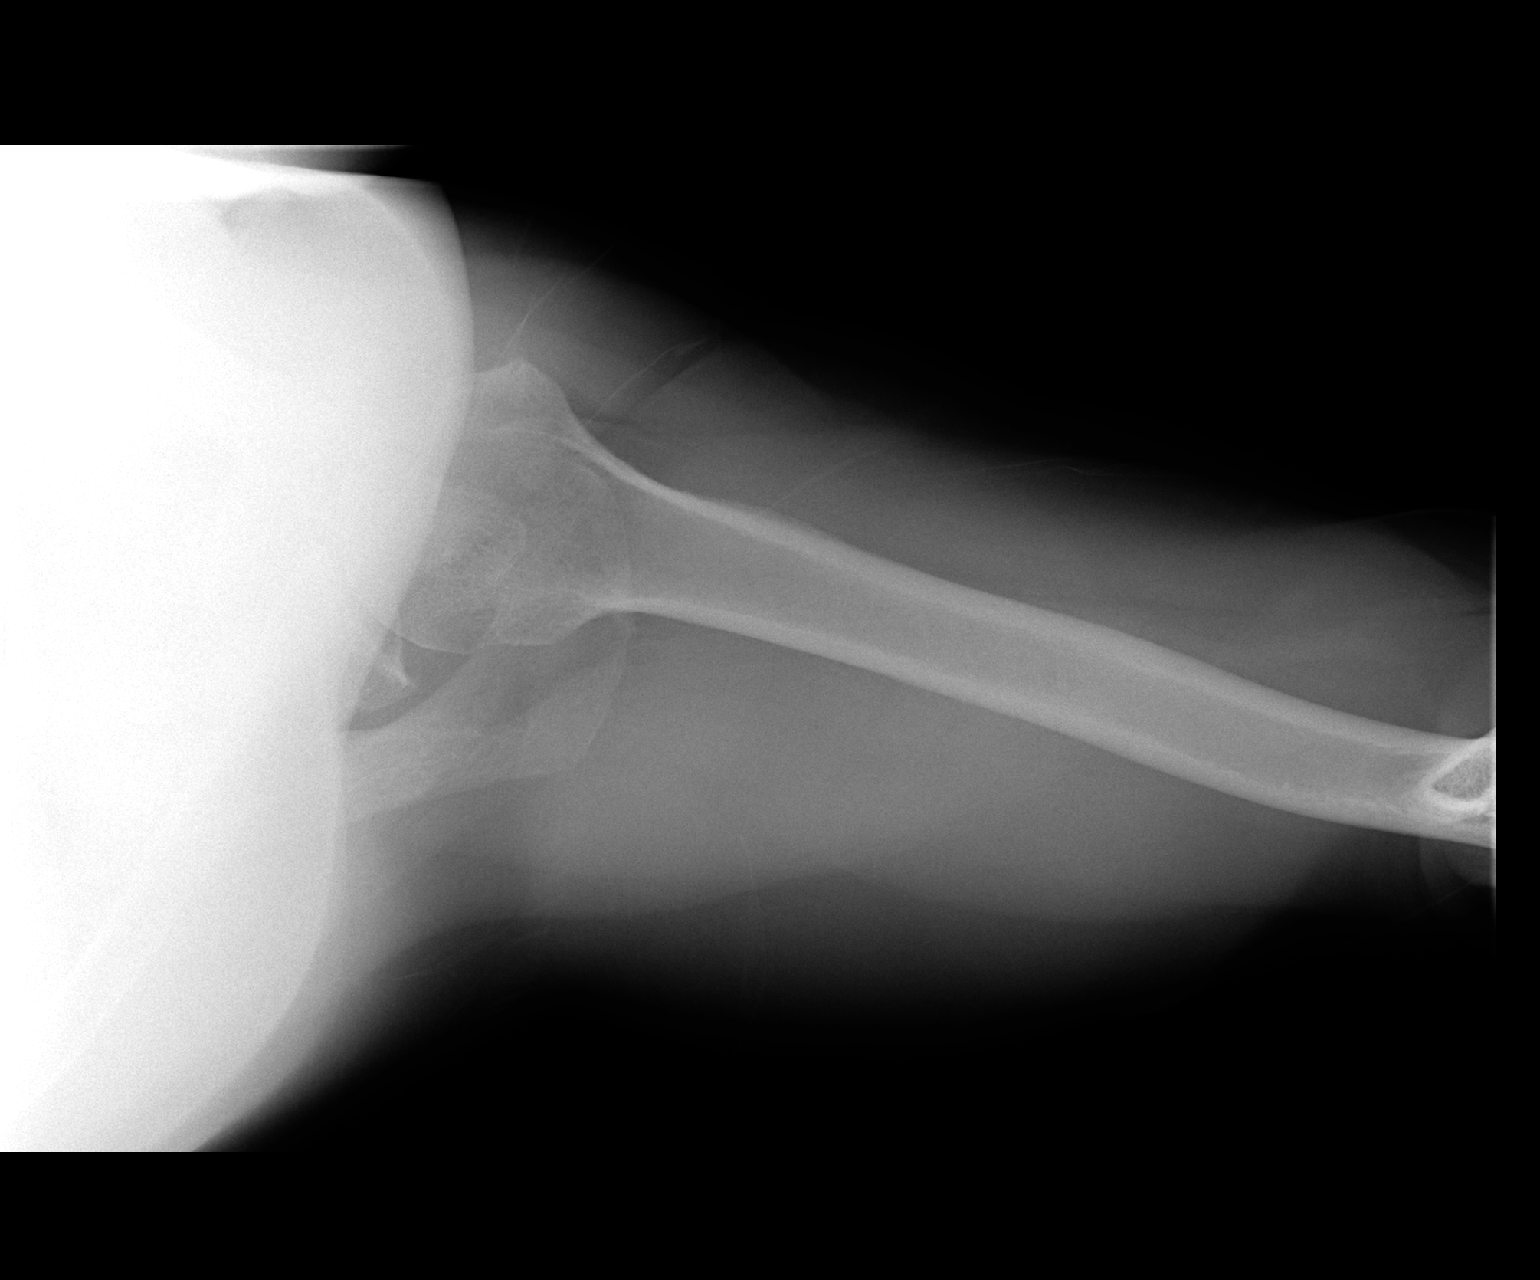

[3 of 3 positions shown; findings below may reference images not displayed]

FINDINGS: No evidence for acute fracture, separation, or dislocation. Mild
degenerative changes noted at the AC joint. There is also very minimal
degeneration at the rotator cuff insertion.
IMPRESSION: No acute bony abnormality.

## 2007-01-21 ENCOUNTER — Ambulatory Visit: Payer: Self-pay | Admitting: Vascular Surgery

## 2007-01-21 ENCOUNTER — Ambulatory Visit (HOSPITAL_COMMUNITY): Admission: RE | Admit: 2007-01-21 | Discharge: 2007-01-21 | Payer: Self-pay | Admitting: Internal Medicine

## 2007-01-30 ENCOUNTER — Emergency Department (HOSPITAL_COMMUNITY): Admission: EM | Admit: 2007-01-30 | Discharge: 2007-01-30 | Payer: Self-pay | Admitting: Emergency Medicine

## 2007-01-30 IMAGING — CR DG CHEST 2V
2 series · 2 of 2 positions shown · non-contrast
Comparison: [DATE].

CLINICAL DATA: 61 year old with cough.
 CHEST - 2 VIEW:

[w chest pa]
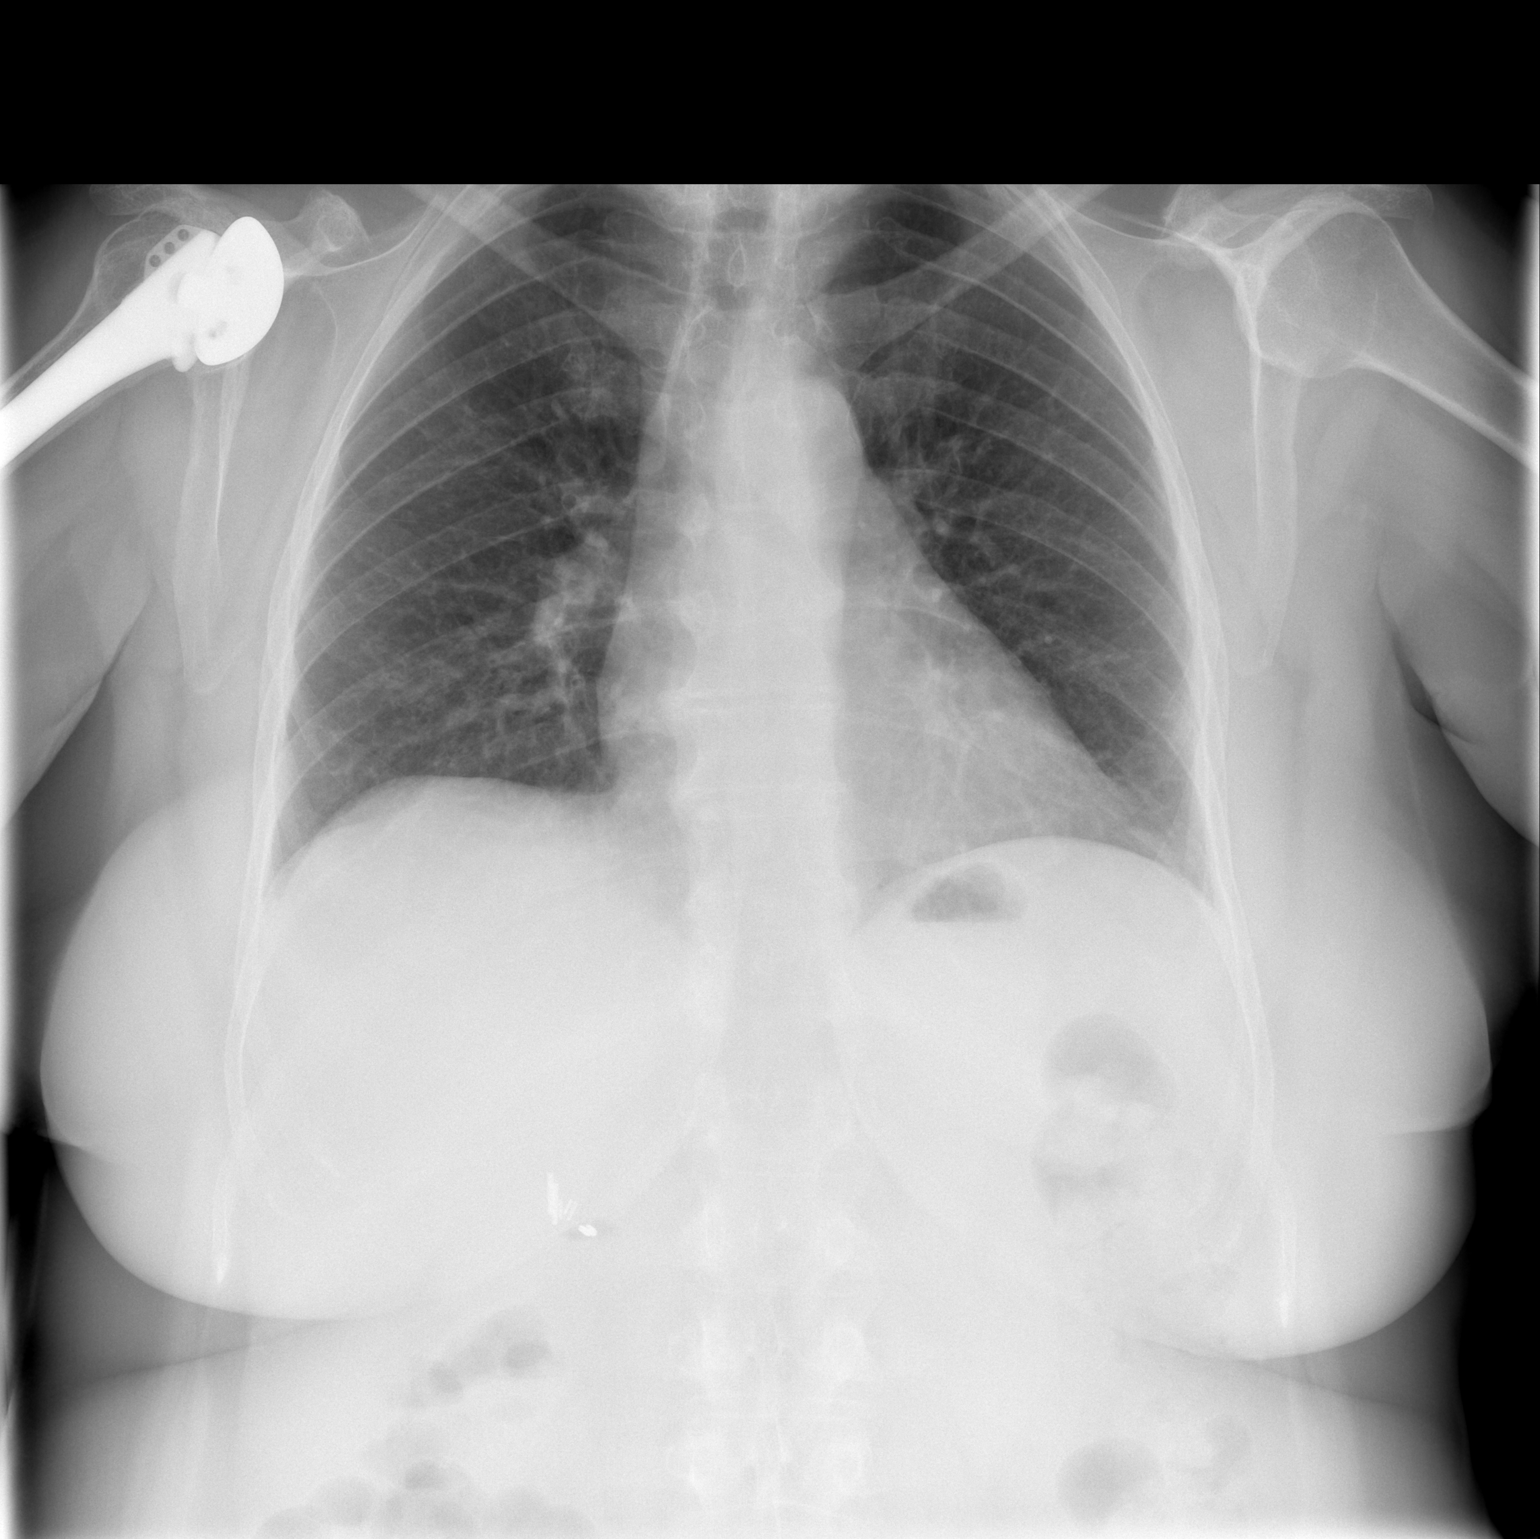

[w chest lat]
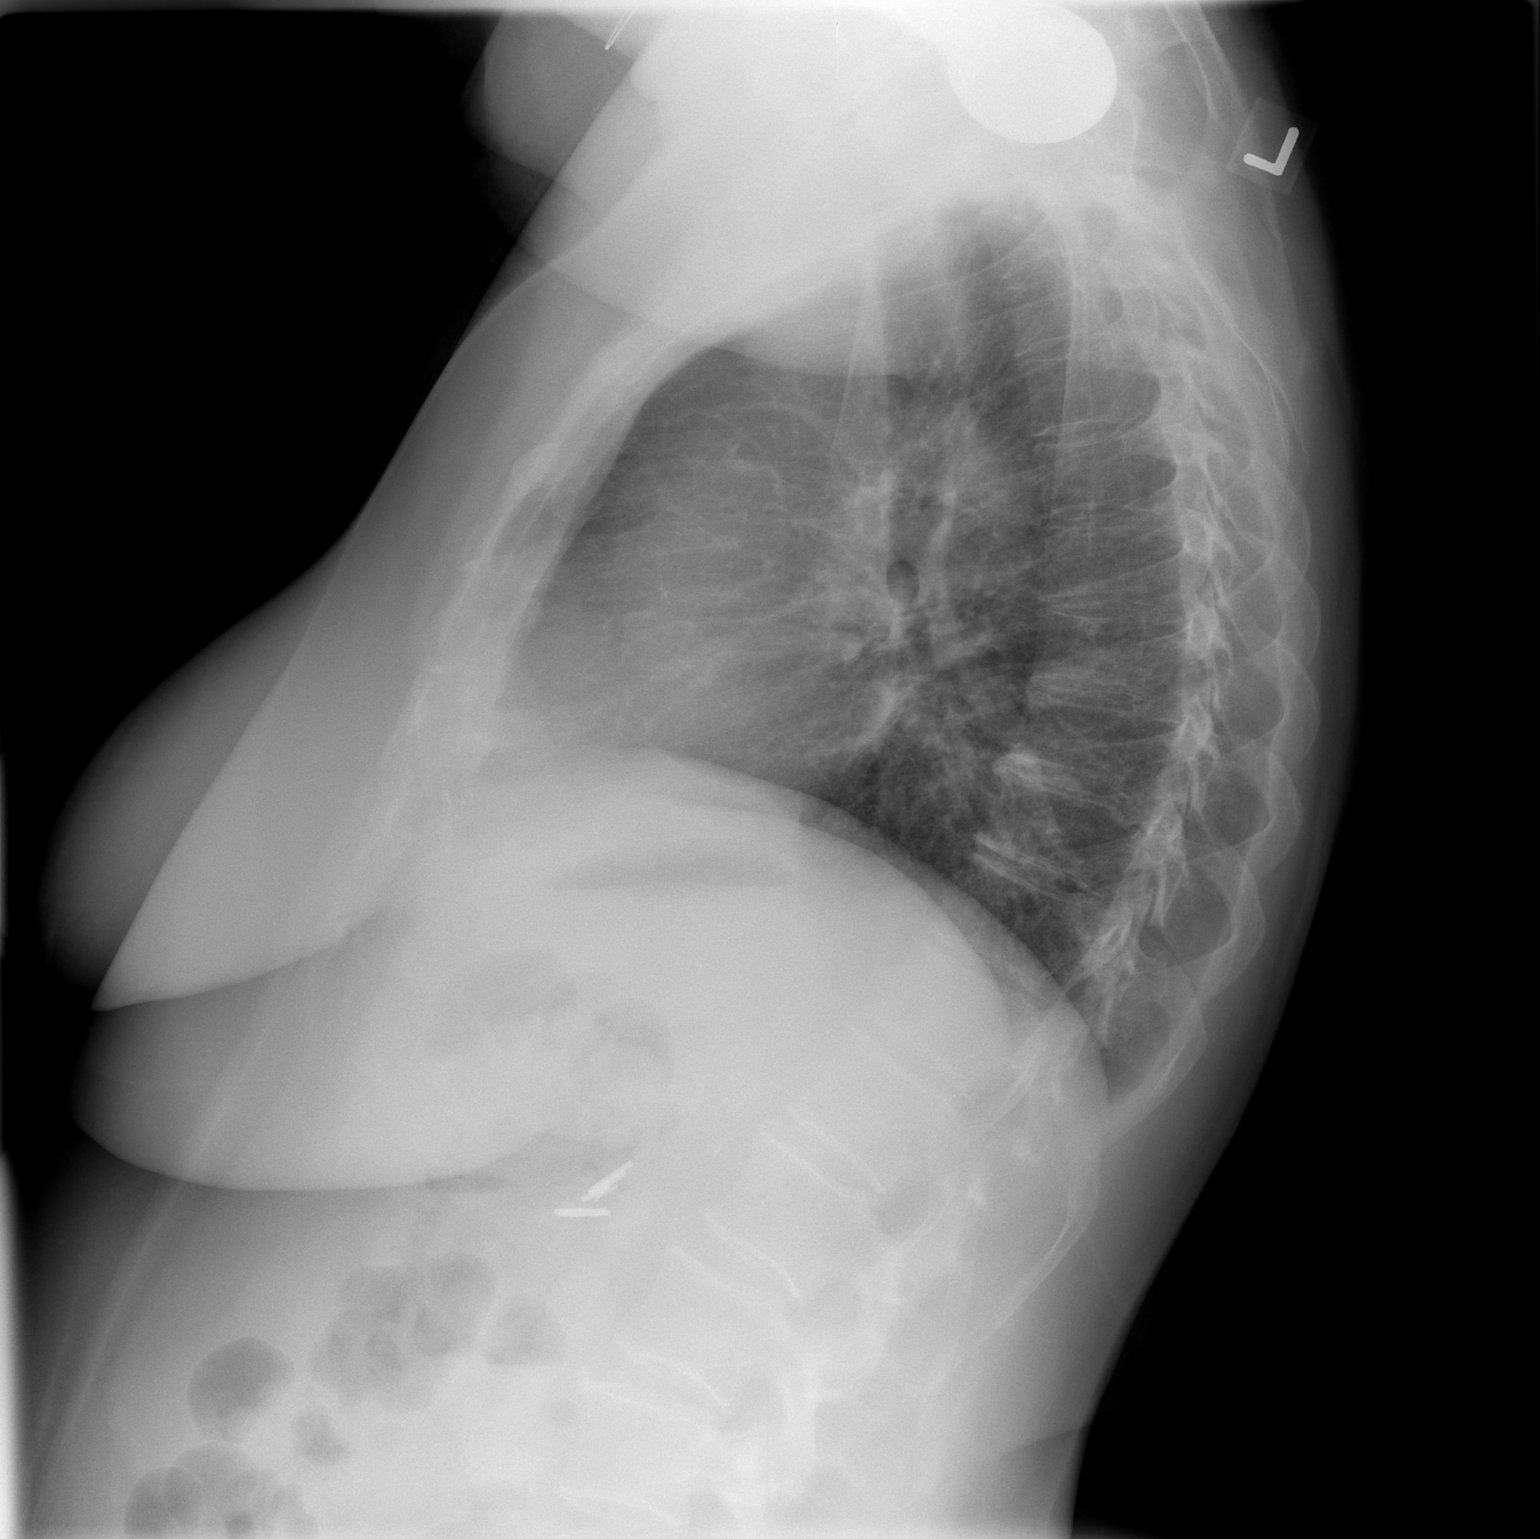

[2 of 2 positions shown; findings below may reference images not displayed]

FINDINGS: The cardiac silhouette, mediastinal and hilar contours are within normal limits and stable.  There are chronic lung changes but no acute pulmonary findings.
IMPRESSION: No acute cardiopulmonary findings.  No change since the prior study of [DATE].

## 2009-03-21 ENCOUNTER — Emergency Department (HOSPITAL_COMMUNITY): Admission: EM | Admit: 2009-03-21 | Discharge: 2009-03-21 | Payer: Self-pay | Admitting: Emergency Medicine

## 2009-06-17 ENCOUNTER — Emergency Department (HOSPITAL_COMMUNITY): Admission: EM | Admit: 2009-06-17 | Discharge: 2009-06-17 | Payer: Self-pay | Admitting: Emergency Medicine

## 2009-09-26 ENCOUNTER — Encounter: Admission: RE | Admit: 2009-09-26 | Discharge: 2009-09-26 | Payer: Self-pay | Admitting: Internal Medicine

## 2009-09-26 IMAGING — CR DG ABDOMEN 1V
2 series · 2 of 2 positions shown · non-contrast
Comparison: CT abdomen pelvis without contrast [DATE] and
[DATE].

CLINICAL DATA: Left back pain and left flank pain.  History of
urinary tract calculi.

ABDOMEN - 1 VIEW [DATE]:

[t abdomen supine (1 of 2)]
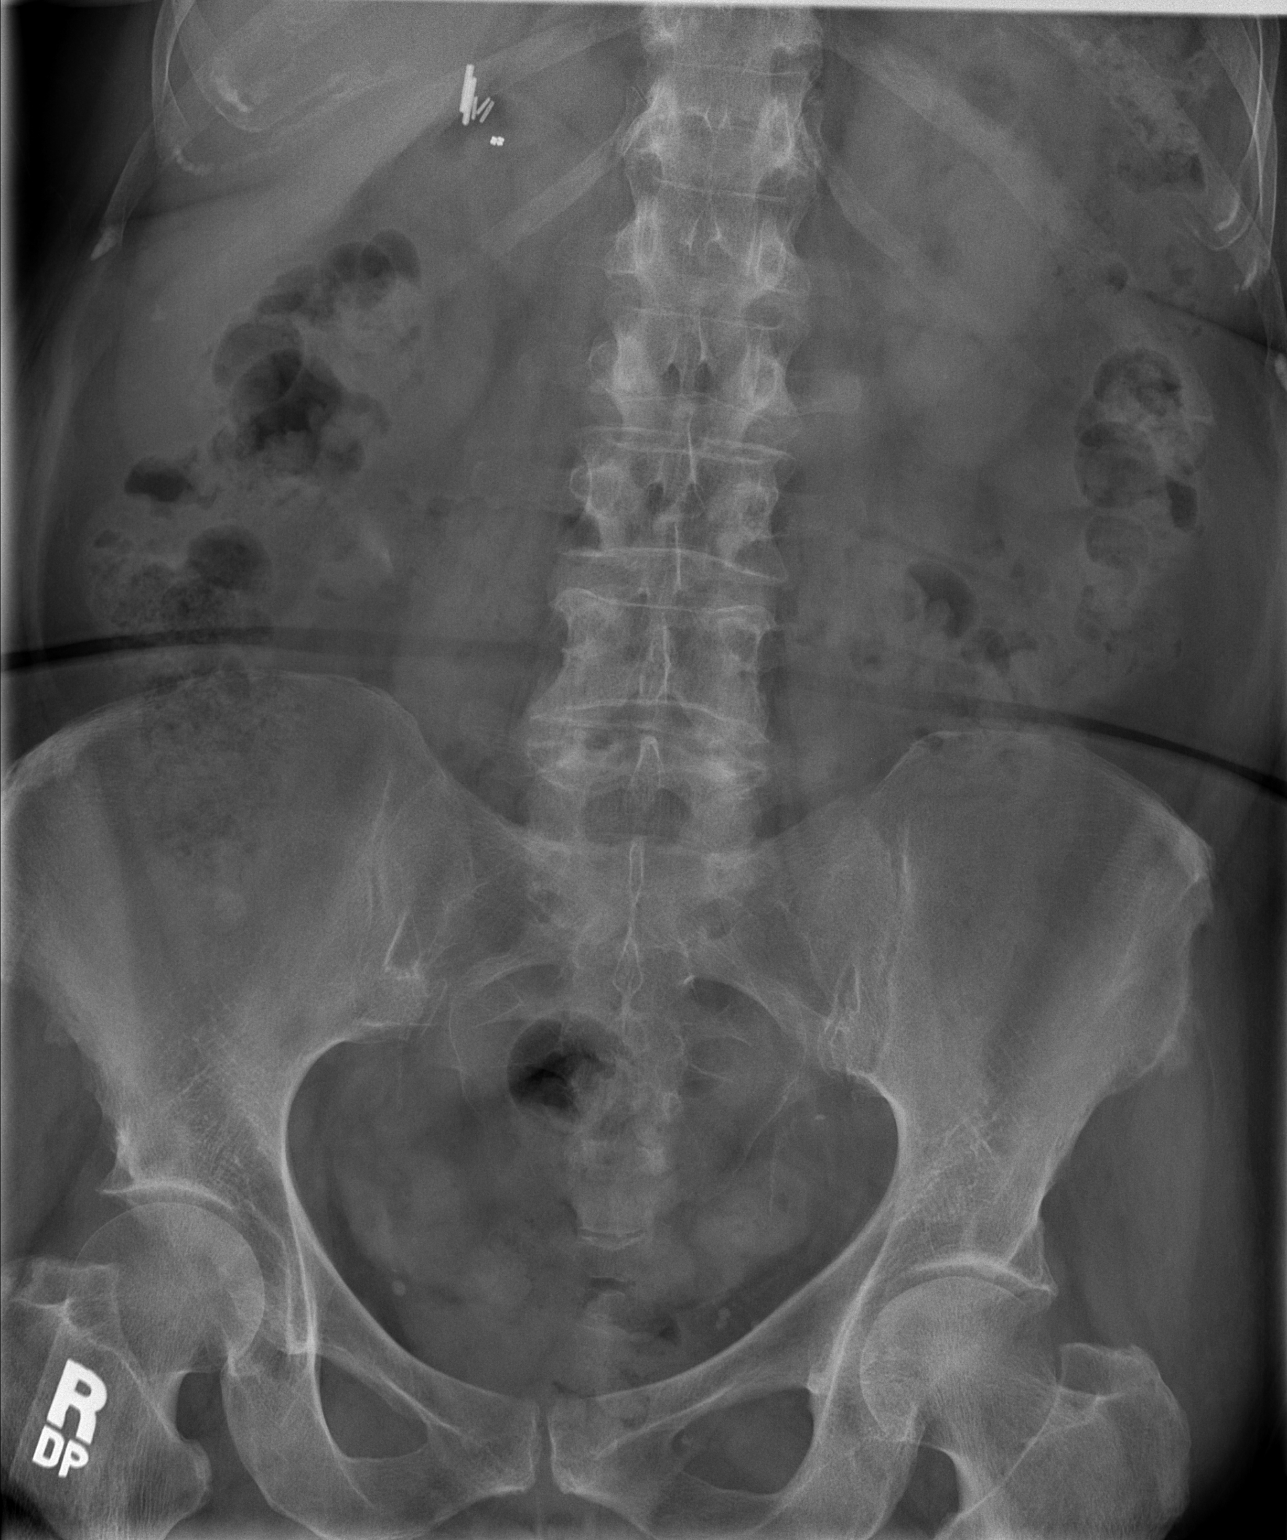

[t abdomen supine (2 of 2)]
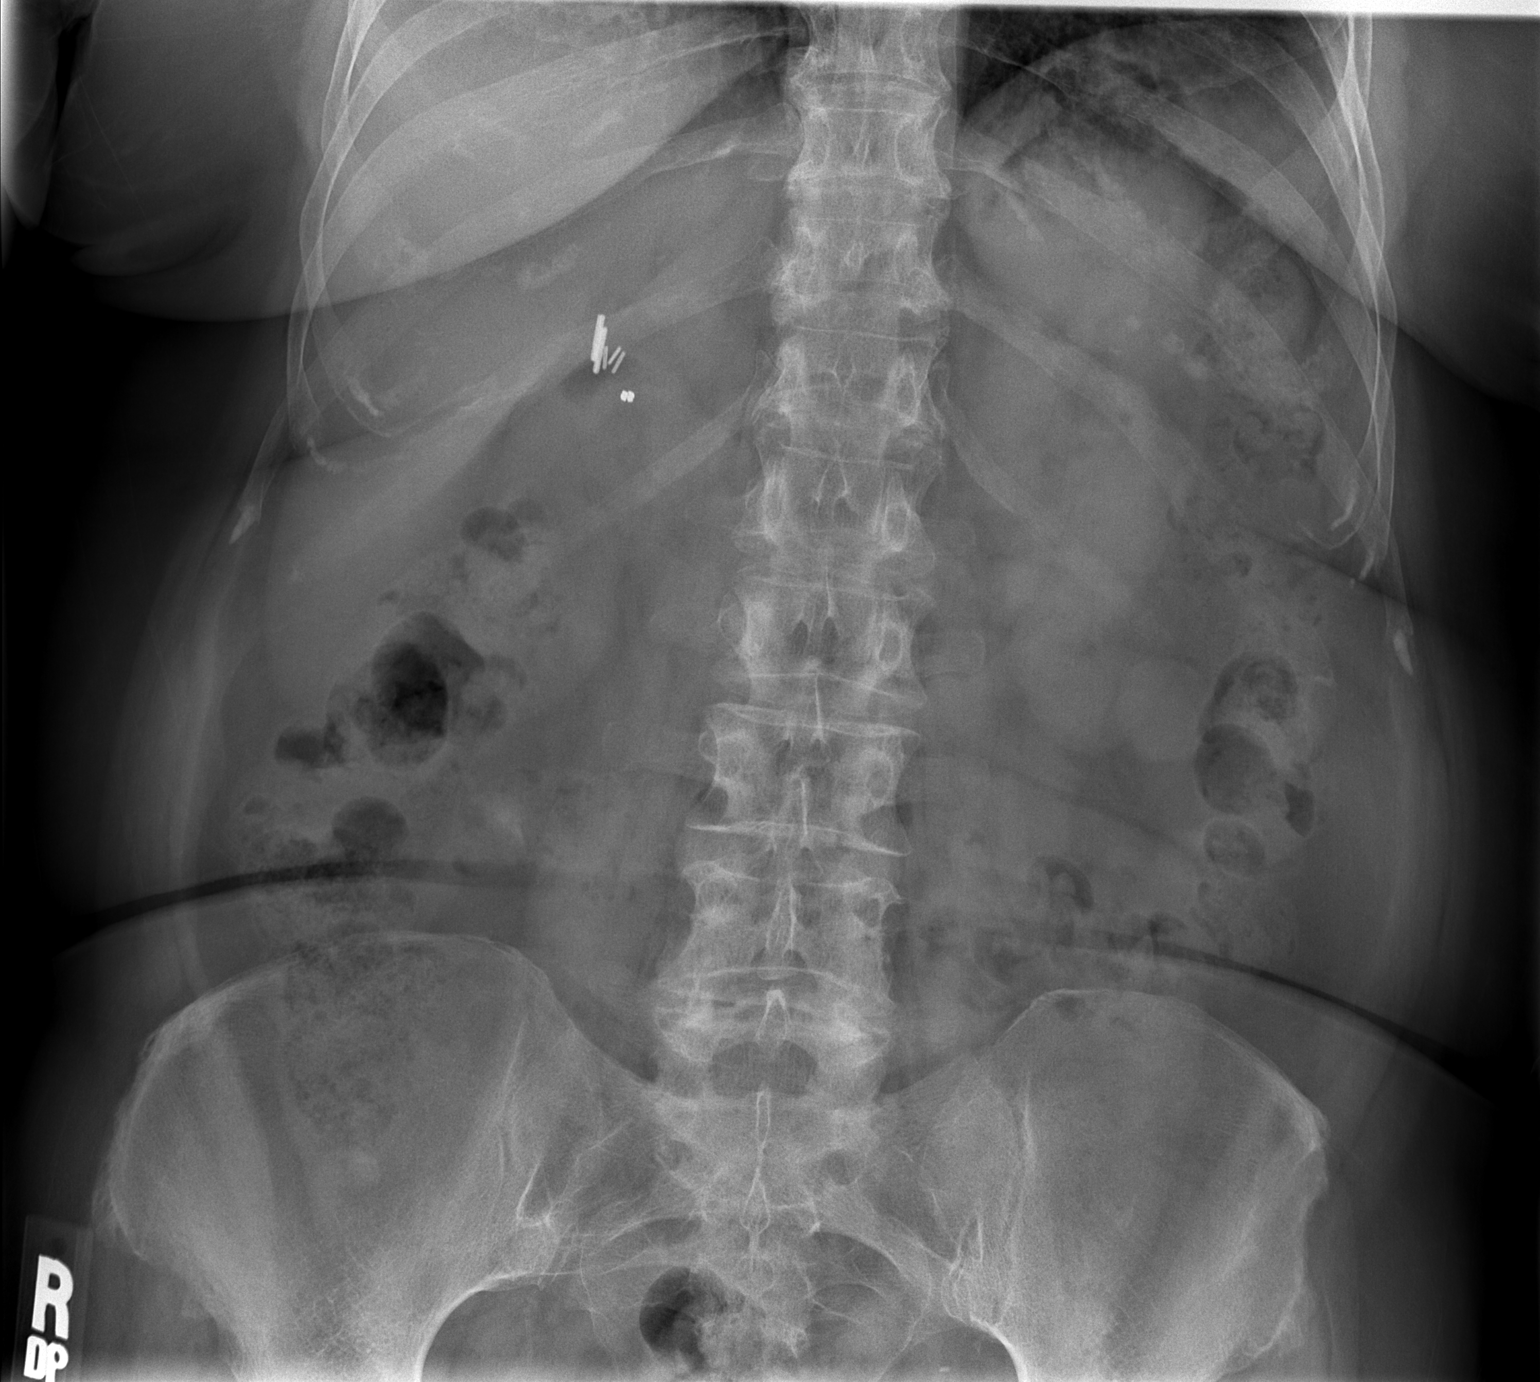

[2 of 2 positions shown; findings below may reference images not displayed]

FINDINGS: Phleboliths in both sides of the pelvis, unchanged.  No
visible opaque urinary tract calculi on either side.  Bowel gas
pattern unremarkable.  Surgical clips in the right upper quadrant
from prior cholecystectomy.  Generalized osteopenia and mild
degenerative changes in the lower lumbar spine.
IMPRESSION: No acute abdominal abnormalities.  No visible opaque urinary tract
calculi.

## 2010-03-04 IMAGING — CR DG CHEST 2V
2 series · 2 of 2 positions shown · non-contrast
Comparison: [DATE]

CLINICAL DATA: Pain and dizziness.  Nausea.

CHEST - 2 VIEW

[w chest pa]
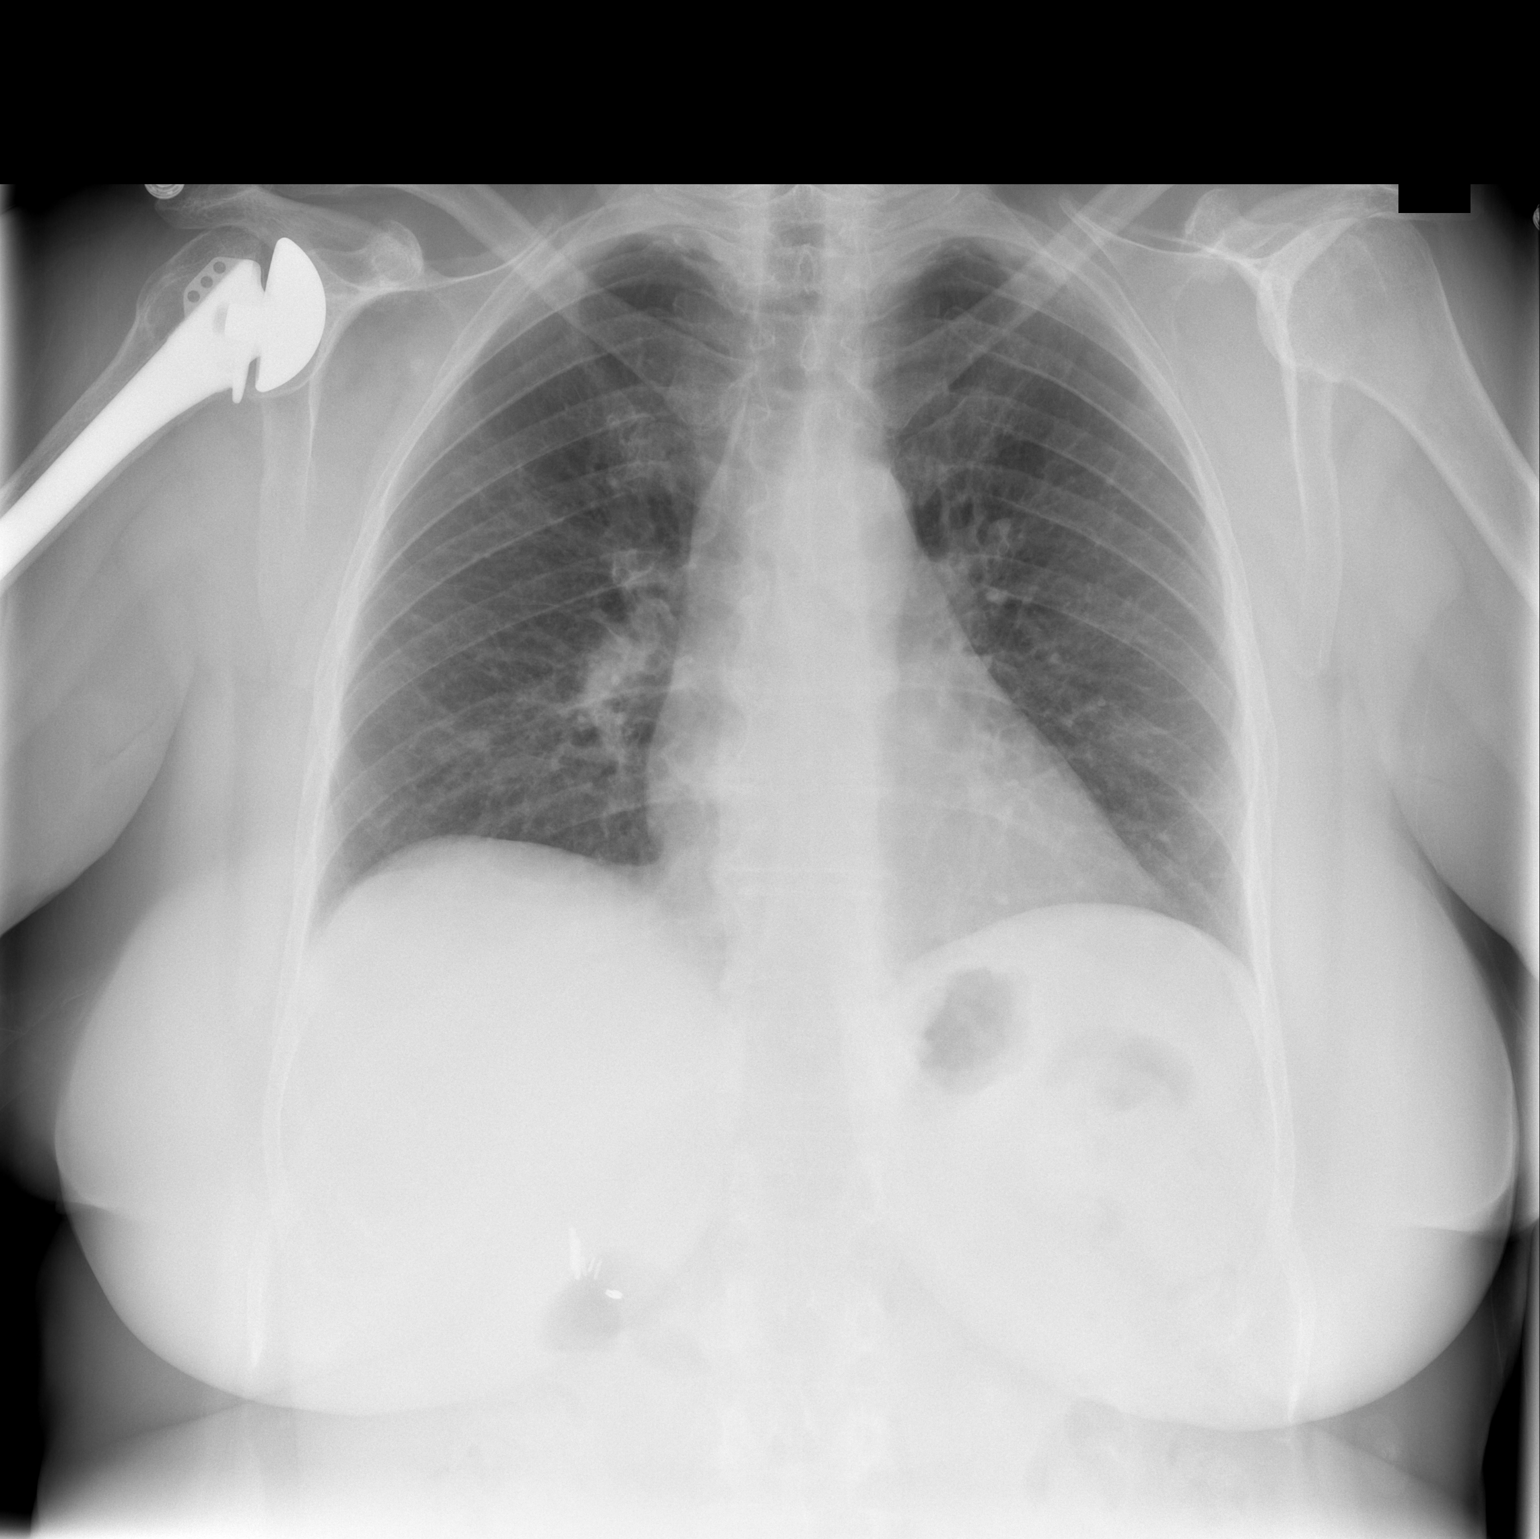

[w chest lat]
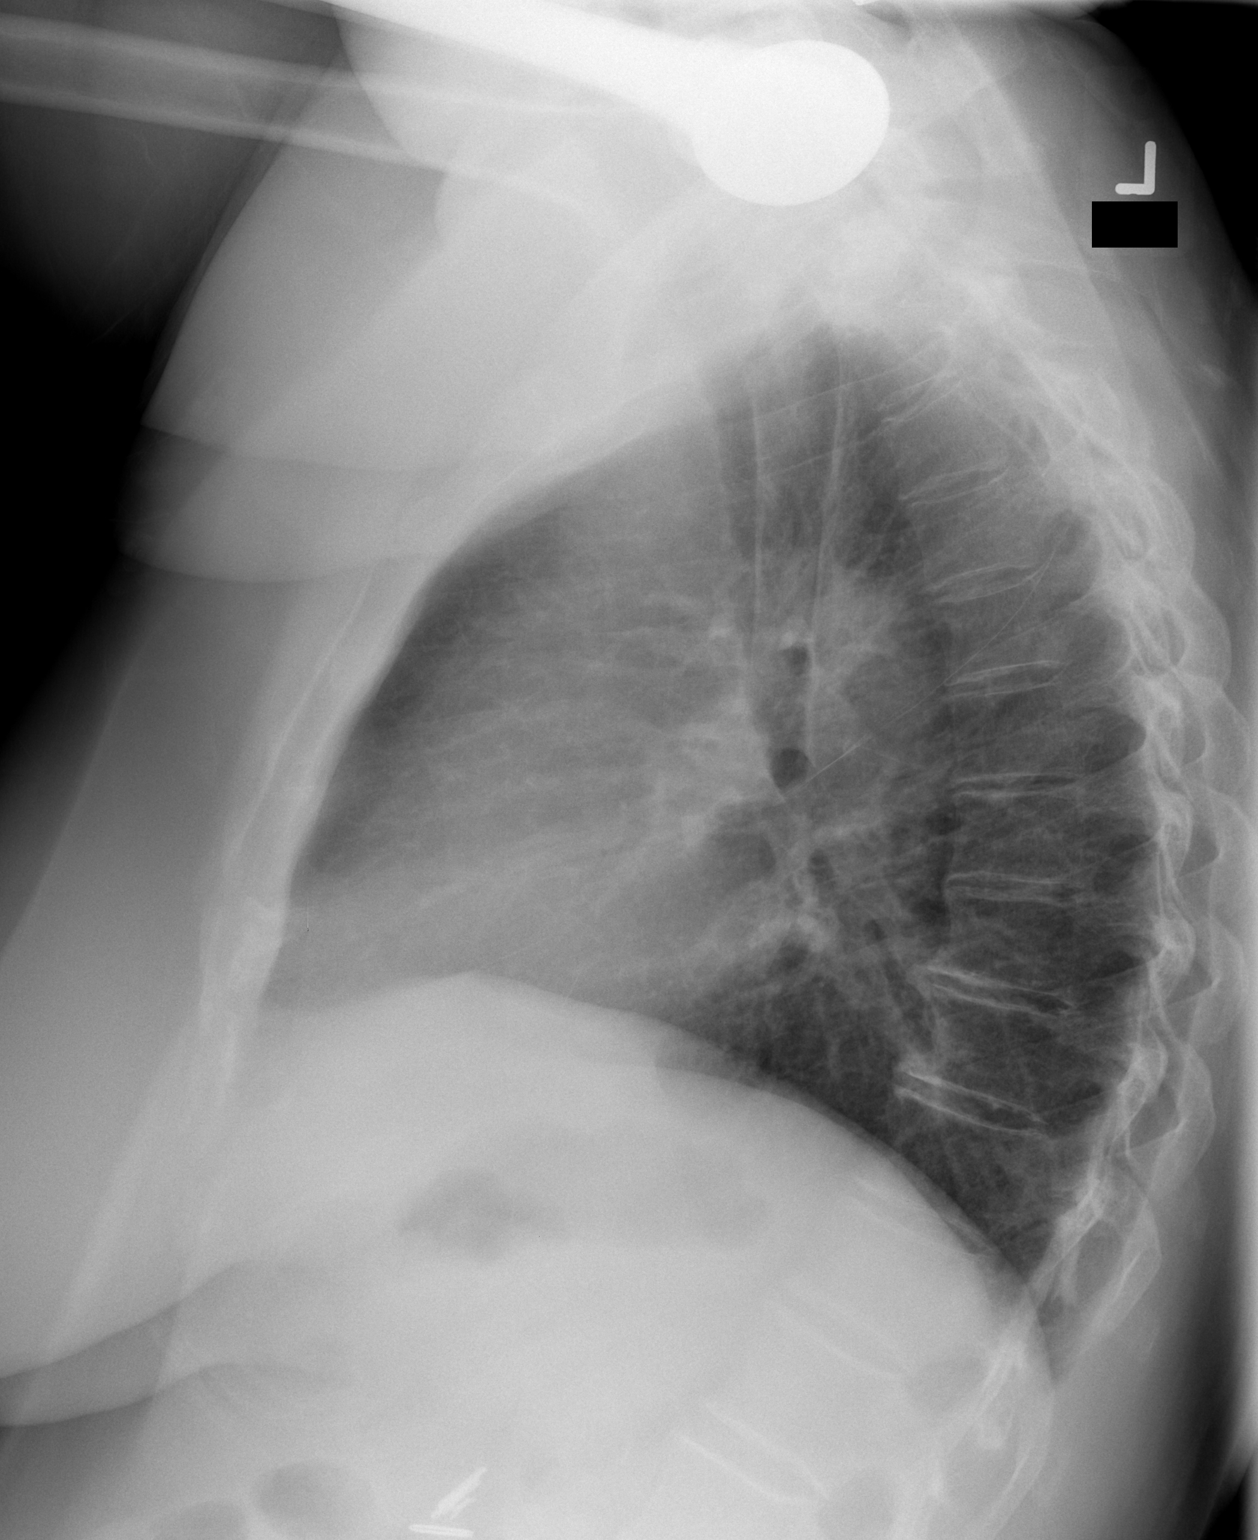

[2 of 2 positions shown; findings below may reference images not displayed]

FINDINGS: Heart size is normal.

No pleural effusion or pulmonary edema.

No airspace consolidation.

Heart size appears normal.

No pleural effusion or pulmonary edema.

No airspace consolidation.
IMPRESSION: 1.  No active cardiopulmonary disease.

## 2010-03-05 ENCOUNTER — Ambulatory Visit: Payer: Self-pay | Admitting: Internal Medicine

## 2010-03-24 ENCOUNTER — Encounter: Admission: RE | Admit: 2010-03-24 | Discharge: 2010-03-24 | Payer: Self-pay | Admitting: Orthopedic Surgery

## 2010-04-16 ENCOUNTER — Emergency Department (HOSPITAL_COMMUNITY): Admission: EM | Admit: 2010-04-16 | Discharge: 2010-04-17 | Payer: Self-pay | Admitting: Emergency Medicine

## 2010-04-16 ENCOUNTER — Encounter: Admission: RE | Admit: 2010-04-16 | Discharge: 2010-04-16 | Payer: Self-pay | Admitting: Orthopedic Surgery

## 2010-07-13 ENCOUNTER — Emergency Department (HOSPITAL_COMMUNITY): Admission: EM | Admit: 2010-07-13 | Discharge: 2010-07-13 | Payer: Self-pay | Admitting: Emergency Medicine

## 2010-07-13 IMAGING — CR DG KNEE COMPLETE 4+V*L*
4 series · 4 of 4 positions shown · non-contrast
Comparison: [DATE]

CLINICAL DATA: Status post fall

LEFT KNEE - COMPLETE 4+ VIEW

[t knee ap left]
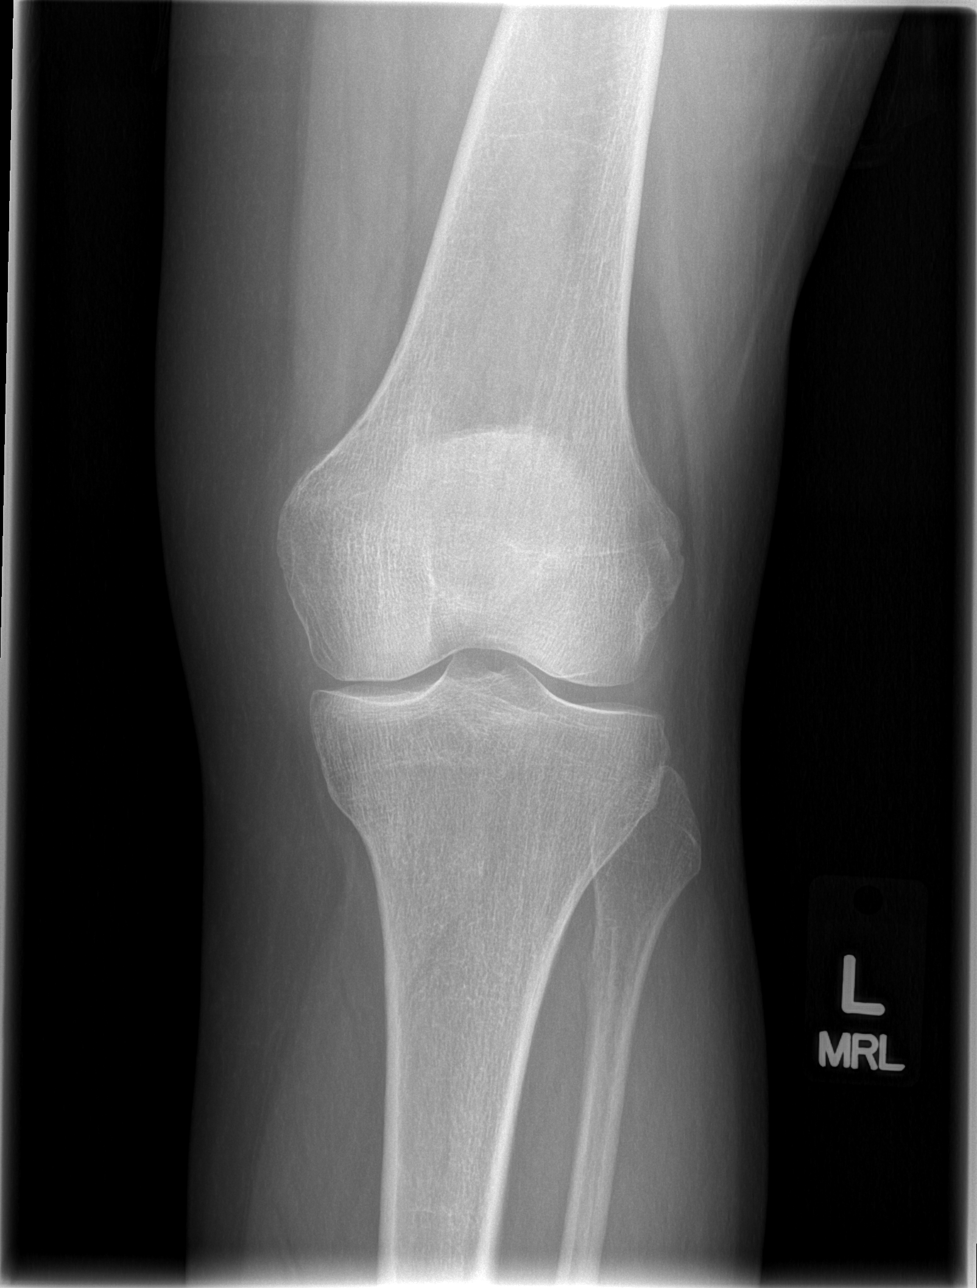

[t knee oblique left (1 of 2)]
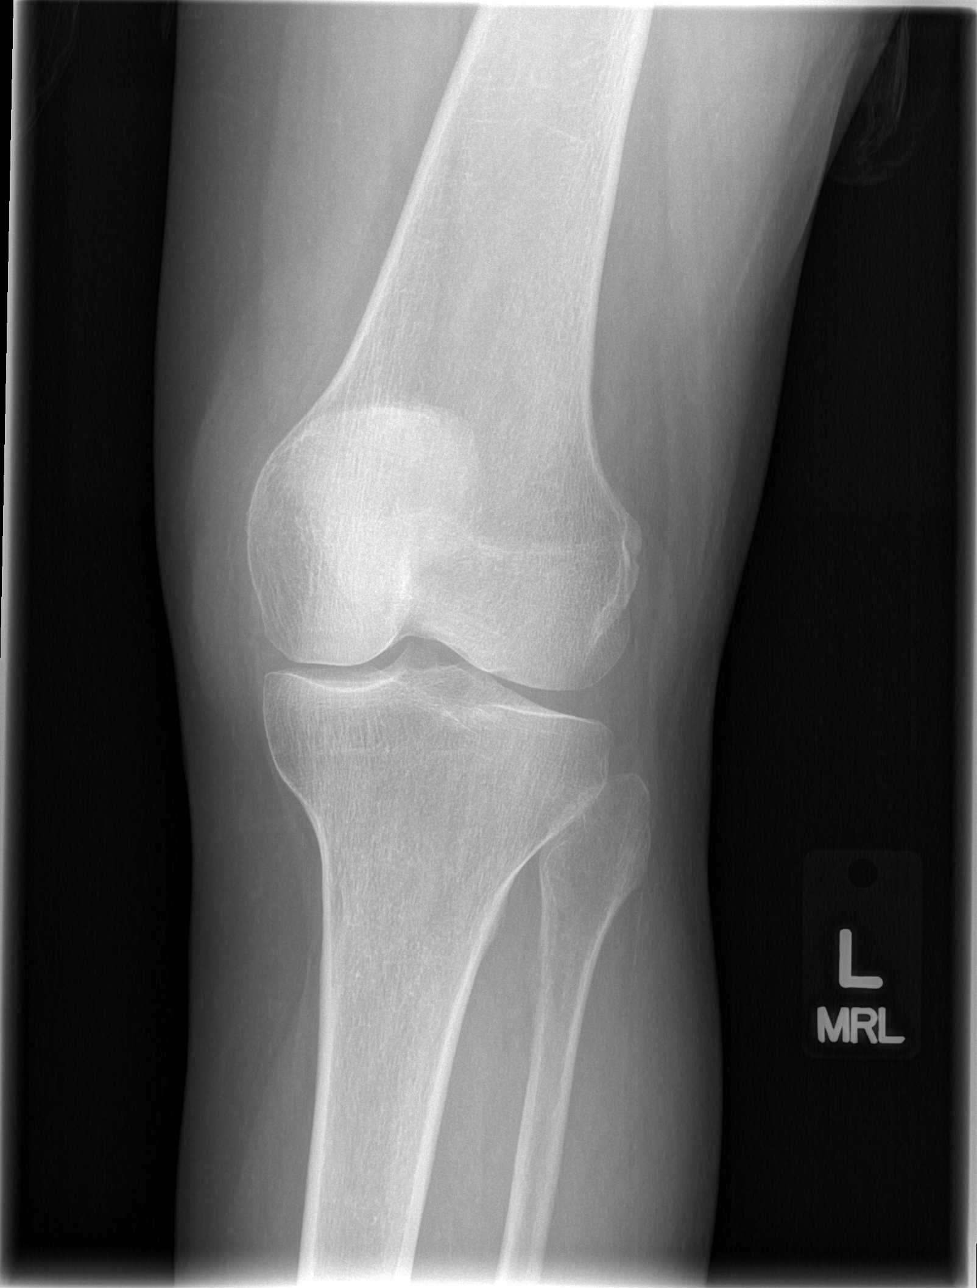

[t knee oblique left (2 of 2)]
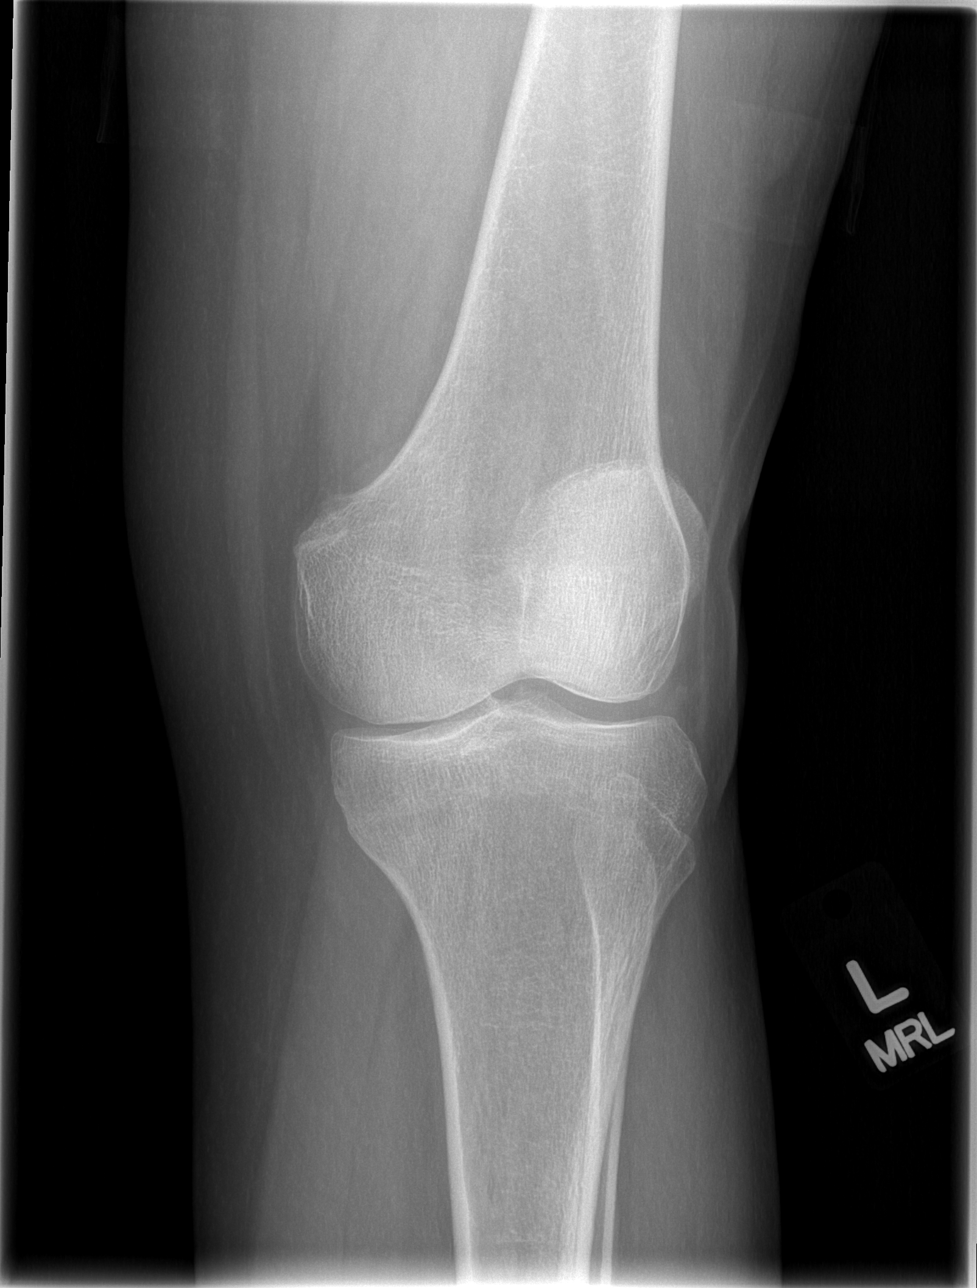

[t knee lat left]
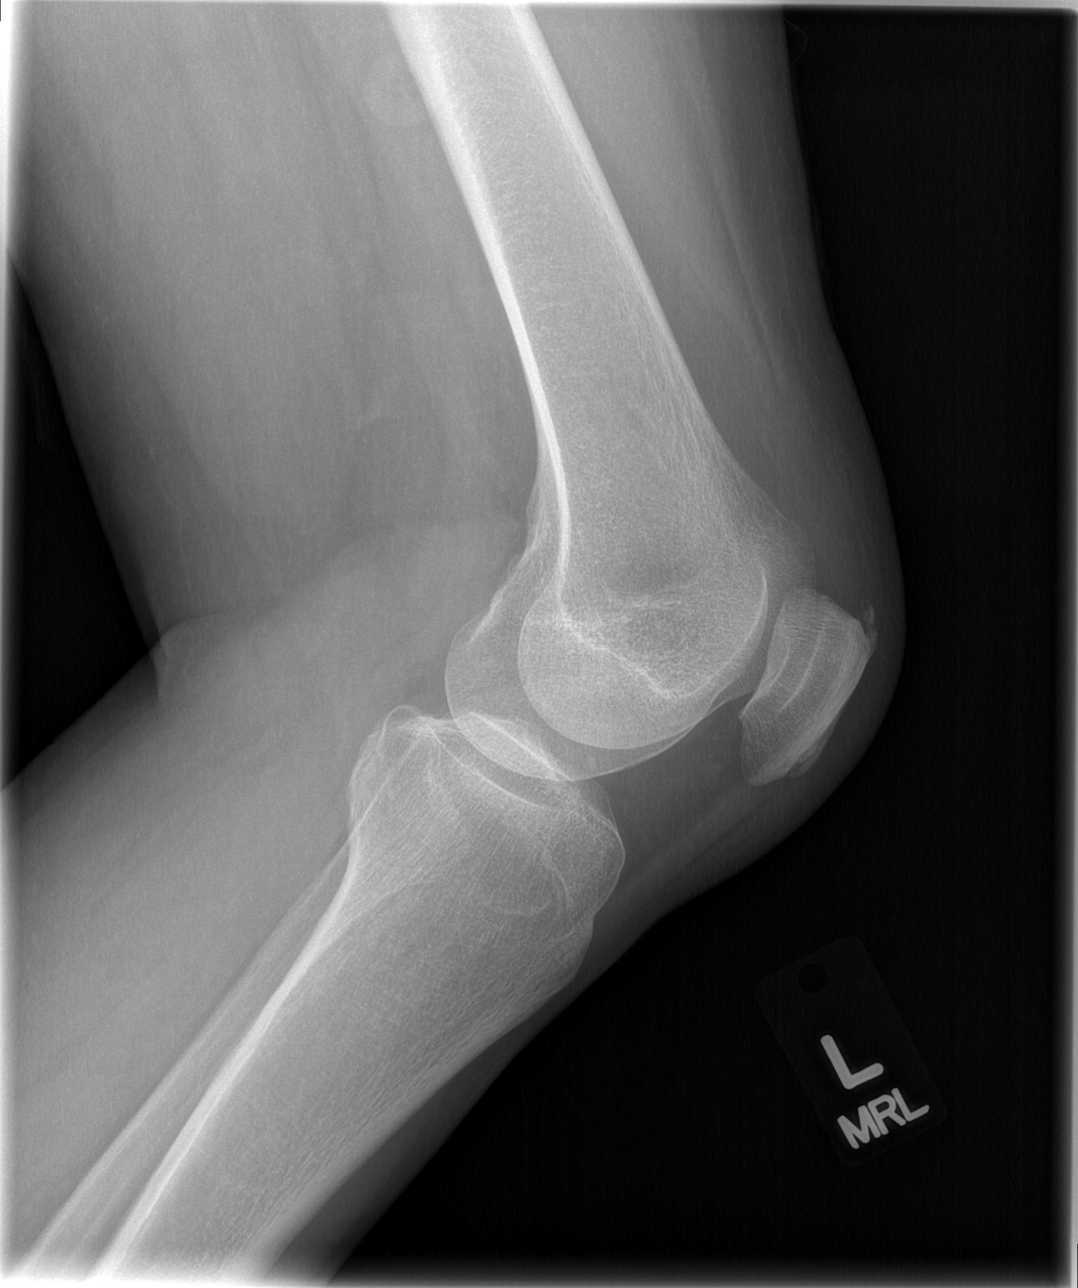

[4 of 4 positions shown; findings below may reference images not displayed]

FINDINGS: Four views of the left knee submitted.  No acute fracture
or subluxation.  Small joint effusion.  Prepatellar soft tissue
swelling.  There is diffuse osteopenia. Stable mild spurring of the
patella.
IMPRESSION: No acute fracture or subluxation. Prepatellar soft tissue swelling.
Small joint effusion.

## 2010-11-15 ENCOUNTER — Inpatient Hospital Stay (HOSPITAL_COMMUNITY): Admission: EM | Admit: 2010-11-15 | Discharge: 2010-03-05 | Payer: Self-pay | Admitting: Emergency Medicine

## 2010-12-30 ENCOUNTER — Encounter: Payer: Self-pay | Admitting: Orthopedic Surgery

## 2011-02-26 LAB — POCT I-STAT, CHEM 8
HCT: 40 % (ref 36.0–46.0)
Hemoglobin: 13.6 g/dL (ref 12.0–15.0)
Potassium: 4.6 mEq/L (ref 3.5–5.1)
Sodium: 136 mEq/L (ref 135–145)
TCO2: 26 mmol/L (ref 0–100)

## 2011-02-26 LAB — GLUCOSE, CAPILLARY
Glucose-Capillary: 185 mg/dL — ABNORMAL HIGH (ref 70–99)
Glucose-Capillary: 212 mg/dL — ABNORMAL HIGH (ref 70–99)
Glucose-Capillary: 216 mg/dL — ABNORMAL HIGH (ref 70–99)
Glucose-Capillary: 295 mg/dL — ABNORMAL HIGH (ref 70–99)

## 2011-02-26 LAB — URINALYSIS, ROUTINE W REFLEX MICROSCOPIC
Bilirubin Urine: NEGATIVE
Glucose, UA: >1000 mg/dL — AB
Hgb urine dipstick: NEGATIVE
Ketones, ur: NEGATIVE mg/dL
Protein, ur: NEGATIVE mg/dL
Urobilinogen, UA: 0.2 mg/dL (ref 0.0–1.0)

## 2011-03-03 LAB — GLUCOSE, CAPILLARY
Glucose-Capillary: 108 mg/dL — ABNORMAL HIGH (ref 70–99)
Glucose-Capillary: 192 mg/dL — ABNORMAL HIGH (ref 70–99)

## 2011-03-03 LAB — DIFFERENTIAL
Basophils Absolute: 0 10*3/uL (ref 0.0–0.1)
Basophils Relative: 0 % (ref 0–1)
Eosinophils Absolute: 0.1 10*3/uL (ref 0.0–0.7)
Eosinophils Relative: 2 % (ref 0–5)
Lymphocytes Relative: 42 % (ref 12–46)
Monocytes Absolute: 0.5 10*3/uL (ref 0.1–1.0)
Monocytes Relative: 7 % (ref 3–12)
Neutrophils Relative %: 57 % (ref 43–77)

## 2011-03-03 LAB — LIPID PANEL
HDL: 32 mg/dL — ABNORMAL LOW (ref >39)
Total CHOL/HDL Ratio: 9 RATIO
VLDL: 57 mg/dL — ABNORMAL HIGH (ref 0–40)

## 2011-03-03 LAB — URINALYSIS, ROUTINE W REFLEX MICROSCOPIC
Bilirubin Urine: NEGATIVE
Hgb urine dipstick: NEGATIVE
Ketones, ur: NEGATIVE mg/dL
Specific Gravity, Urine: 1.012 (ref 1.005–1.030)
Urobilinogen, UA: 0.2 mg/dL (ref 0.0–1.0)

## 2011-03-03 LAB — CBC
HCT: 33.7 % — ABNORMAL LOW (ref 36.0–46.0)
HCT: 36.5 % (ref 36.0–46.0)
Hemoglobin: 11.8 g/dL — ABNORMAL LOW (ref 12.0–15.0)
Hemoglobin: 12.7 g/dL (ref 12.0–15.0)
MCHC: 34.8 g/dL (ref 30.0–36.0)
RBC: 4.05 MIL/uL (ref 3.87–5.11)
RDW: 13.4 % (ref 11.5–15.5)

## 2011-03-03 LAB — CARDIAC PANEL(CRET KIN+CKTOT+MB+TROPI)
Relative Index: INVALID (ref 0.0–2.5)
Relative Index: INVALID (ref 0.0–2.5)
Relative Index: INVALID (ref 0.0–2.5)
Troponin I: 0.01 ng/mL (ref 0.00–0.06)
Troponin I: 0.02 ng/mL (ref 0.00–0.06)

## 2011-03-03 LAB — BASIC METABOLIC PANEL
BUN: 13 mg/dL (ref 6–23)
CO2: 25 mEq/L (ref 19–32)
Calcium: 9.2 mg/dL (ref 8.4–10.5)
Calcium: 9.2 mg/dL (ref 8.4–10.5)
Creatinine, Ser: 0.77 mg/dL (ref 0.4–1.2)
Creatinine, Ser: 0.85 mg/dL (ref 0.4–1.2)
GFR calc Af Amer: >60 mL/min (ref >60)
GFR calc non Af Amer: >60 mL/min (ref >60)

## 2011-03-03 LAB — COMPREHENSIVE METABOLIC PANEL
ALT: 29 U/L (ref 0–35)
Alkaline Phosphatase: 75 U/L (ref 39–117)
BUN: 11 mg/dL (ref 6–23)
CO2: 29 mEq/L (ref 19–32)
Calcium: 9.5 mg/dL (ref 8.4–10.5)
GFR calc non Af Amer: >60 mL/min (ref >60)
Glucose, Bld: 99 mg/dL (ref 70–99)
Potassium: 4.1 mEq/L (ref 3.5–5.1)
Sodium: 139 mEq/L (ref 135–145)

## 2011-03-03 LAB — URINE CULTURE: Special Requests: NEGATIVE

## 2011-03-03 LAB — LIPASE, BLOOD: Lipase: 28 U/L (ref 11–59)

## 2011-03-03 LAB — BRAIN NATRIURETIC PEPTIDE: Pro B Natriuretic peptide (BNP): 44 pg/mL (ref 0.0–100.0)

## 2011-03-03 LAB — SEDIMENTATION RATE: Sed Rate: 28 mm/hr — ABNORMAL HIGH (ref 0–22)

## 2011-03-03 LAB — TSH: TSH: 4.575 u[IU]/mL — ABNORMAL HIGH (ref 0.350–4.500)

## 2011-03-03 LAB — TROPONIN I: Troponin I: <0.01 ng/mL (ref 0.00–0.06)

## 2011-03-17 LAB — URINE MICROSCOPIC-ADD ON

## 2011-03-17 LAB — CBC
HCT: 36.7 % (ref 36.0–46.0)
Hemoglobin: 12.8 g/dL (ref 12.0–15.0)
MCV: 89.9 fL (ref 78.0–100.0)
Platelets: 273 10*3/uL (ref 150–400)
RDW: 13.1 % (ref 11.5–15.5)

## 2011-03-17 LAB — BASIC METABOLIC PANEL
BUN: 16 mg/dL (ref 6–23)
CO2: 27 mEq/L (ref 19–32)
Chloride: 100 mEq/L (ref 96–112)
Glucose, Bld: 137 mg/dL — ABNORMAL HIGH (ref 70–99)
Potassium: 4 mEq/L (ref 3.5–5.1)

## 2011-03-17 LAB — URINALYSIS, ROUTINE W REFLEX MICROSCOPIC
Bilirubin Urine: NEGATIVE
Glucose, UA: NEGATIVE mg/dL
Ketones, ur: NEGATIVE mg/dL
Nitrite: NEGATIVE
Protein, ur: NEGATIVE mg/dL

## 2011-03-17 LAB — URINE CULTURE: Colony Count: 30000

## 2011-03-17 LAB — DIFFERENTIAL
Basophils Absolute: 0 10*3/uL (ref 0.0–0.1)
Eosinophils Absolute: 0.1 10*3/uL (ref 0.0–0.7)
Eosinophils Relative: 2 % (ref 0–5)
Monocytes Absolute: 0.6 10*3/uL (ref 0.1–1.0)

## 2011-03-17 LAB — POCT CARDIAC MARKERS: CKMB, poc: <1 ng/mL — ABNORMAL LOW (ref 1.0–8.0)

## 2011-03-20 LAB — URINALYSIS, ROUTINE W REFLEX MICROSCOPIC
Bilirubin Urine: NEGATIVE
Glucose, UA: 100 mg/dL — AB
Hgb urine dipstick: NEGATIVE
Specific Gravity, Urine: 1.025 (ref 1.005–1.030)
Urobilinogen, UA: 0.2 mg/dL (ref 0.0–1.0)

## 2011-04-26 NOTE — H&P (Signed)
NAME:  Sonya Duffy, Sonya Duffy NO.:  [id number]   MEDICAL RECORD NO.:  [id number]                   PATIENT TYPE:  EMS   LOCATION:  [facility]                                 FACILITY:  [facility]   PHYSICIAN:  Myrtie Neither, M.D.                 DATE OF BIRTH:  02-Nov-1946   DATE OF ADMISSION:  DATE OF DISCHARGE:                                HISTORY & PHYSICAL   CHIEF COMPLAINT:  Pain, right shoulder.  Numbness in right hand.   HISTORY OF PRESENT ILLNESS:  This a 65 year old female who states that she  was trying to retrieve a raccoon from her dog and slipped and fell landing  onto her right shoulder.  The patient developed severe pain in the right  shoulder and initially did not know whether she was going to be able to get  up but was able to crawl and get to the telephone and was brought to [facility] emergency room via ambulance.  The patient denies any loss of  consciousness or any other injuries.   PAST MEDICAL HISTORY:  No previous surgery.  The patient is diabetic as well  as has high blood pressure and hypercholesterol.  The patient takes medication.  Patient controls her diabetes with diet.  Patient is on an antihypertensive drug that she cannot remember and is on  Zocor for her cholesterol.   ALLERGIES:  None known.   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  Denies use of cigarettes.  Patient does live alone.   REVIEW OF SYMPTOMS:  Basically that of history of present illness.  No  cardiac or respiratory, no urinary or bowel symptoms.   FAMILY HISTORY:  Mother had high blood pressure and diabetes.   PHYSICAL EXAMINATION:  GENERAL:  Alert and oriented.  No acute distress.  VITAL SIGNS:  Blood pressure 170/60.  Pulse 100.  Respirations 18.  HEENT:  Head normocephalic.  Eyes/nose are clear.  Lips parched.  NECK:  Supple.  CHEST:  Clear.  CARDIAC:  S1 and S2, regular.  ABDOMEN:  Soft, active bowel sounds.  EXTREMITIES:  Right shoulder diffusely swollen, tender.  Skin intact.  Hyperesthesia stockinette type over the right hand.  Patient has good grip  and pinch.  Intrinsics intact.  Pulses are intact.   X-RAYS:  Comminuted humeral neck fracture with angulation.   IMPRESSION:  1. Comminuted humeral neck fracture, right shoulder.  2. Hypoesthesia.  3. Traumatic neuropraxia.   PLAN:  Open reduction internal fixation, right humerus.                                                Myrtie Neither, M.D.    AC/MEDQ  D:  01/12/2004  T:  01/12/2004  Job:  [id number]

## 2011-04-26 NOTE — Op Note (Signed)
NAME:  ZAMARA, COZAD NO.:  [id number]   MEDICAL RECORD NO.:  [id number]          PATIENT TYPE:  INP   LOCATION:  5011                         FACILITY:  [facility]   PHYSICIAN:  Myrtie Neither, M.D.    DATE OF BIRTH:  [birth year]   DATE OF PROCEDURE:  08/16/2004  DATE OF DISCHARGE:  08/19/2004                                 OPERATIVE REPORT   PREOPERATIVE DIAGNOSIS:  Ankylotic left shoulder post traumatic.   POSTOPERATIVE DIAGNOSIS:  Ankylotic left shoulder post traumatic with screw  fixation.   ANESTHESIA:  General.   PROCEDURE:  Left shoulder hemiprosthesis with shoulder replacement and  removal of hardware left shoulder.   DESCRIPTION OF PROCEDURE:  The patient was taken to the operating room after  given adequate preoperative medications, given general anesthesia and  intubated.  The patient was placed in a barber chair position.  The left  shoulder was prepped with Duraprep and draped in a sterile manner.  Both  were used for hemostasis.  C-arm used to remove the screw as well as implant  placement.  Shoulder strap incision was made over the right shoulder going  through the skin and subcutaneous tissue and down to the fascia, down to the  deltoid.  Splitting deltoid incision was then made exposing the capsule.  The incision was made into the capsule exposing the proximal humerus and  joint itself.  The humeral head was grossly impacted into the canal with the  arm in the neutral position.  The shoulder implant cutting jig was put in  place and appropriate resection of the humeral head was done.  Reaming was  then done down the humeral canal followed by use of rasp.  After adequate  reaming was done, the cancellous screws identified and removed.  After  adequate reaming, sizing of the prosthesis was found to be a 44 x 17 mm  Offset head and neck component and a 10 x 115 mm humeral stem porous-coated.  The implant itself was hammered down the shaft of the  humerus and found to  have a very good snug fit.  Further rasping was needed at the inferior neck  of the humerus.  Range of motion was tested for an extension, abduction, and  external rotation and found all to be good and stable with a good range of  motion.  Copious irrigation with antibiotic solution was done.  Capsule was  also repaired in a reefed fashion.  The fascia was closed with 0 Vicryl, 2-0  for the subcutaneous, and skin staples for the skin.  Compressive dressing  was applied.  Shoulder abduction brace was then applied.  The patient  tolerated the procedure well and went to the recovery room in stable and  satisfactory condition.       AC/MEDQ  D:  09/19/2004  T:  09/19/2004  Job:  [id number]

## 2011-04-26 NOTE — Op Note (Signed)
NAME:  Sonya Duffy, GUILLOT NO.:  [id number]   MEDICAL RECORD NO.:  [id number]                   PATIENT TYPE:  OIB   LOCATION:  2899                                 FACILITY:  [facility]   PHYSICIAN:  Myrtie Neither, M.D.                 DATE OF BIRTH:  1946-01-09   DATE OF PROCEDURE:  03/21/2004  DATE OF DISCHARGE:  03/21/2004                                 OPERATIVE REPORT   PREOPERATIVE DIAGNOSIS:  Painful hardware, right shoulder, status post  fractured right humerus.   POSTOPERATIVE DIAGNOSIS:  Painful hardware, right shoulder, status post  fractured right humerus.   ANESTHESIA:  General.   PROCEDURE:  Removal of Rush rod, right humerus.   The patient was taken to the operating room after given adequate preop  medication and given general anesthesia and intubated.  The right shoulder  was prepped with Duraprep and draped in a sterile manner.  The C-arm was  used to visualize the Rush rod itself.  A small incision made over the  previous scar of the right shoulder going through the skin and subcutaneous  tissue.  Sharp and blunt dissection was made down to the proximal end of the  Rush rod and with an extractor, the Rush rod was removed.  Inspection of the  rotator cuff does reveal that the rotator cuff is intact.  Copious  irrigation was then done.  Wound closure was then done with 2-0 Vicryl for  the subcutaneous and skin staples for the skin.  A compressive dressing was  applied and a sling applied.  The patient tolerated the procedure quite well  and went to the recovery room in stable and satisfactory condition.  The  patient is being discharged home on Percocet one to two q.4h. p.r.n. for  pain, ice packs, and to return to the office in 10 days.  The patient is  being discharged in stable and satisfactory condition.                                               Myrtie Neither, M.D.    AC/MEDQ  D:  05/22/2004  T:  05/23/2004  Job:  [id number]

## 2011-04-26 NOTE — Discharge Summary (Signed)
NAME:  Sonya Duffy, Sonya Duffy NO.:  [id number]   MEDICAL RECORD NO.:  [id number]                   PATIENT TYPE:  INP   LOCATION:  5028                                 FACILITY:  [facility]   PHYSICIAN:  Myrtie Neither, M.D.                 DATE OF BIRTH:  09-06-1946   DATE OF ADMISSION:  01/12/2004  DATE OF DISCHARGE:  01/16/2004                                 DISCHARGE SUMMARY   ADMISSION DIAGNOSIS:  Comminuted fractured right proximal humerus with  avulsion of greater tuberosity, also hyperesthesia, right hand, and  traumatic neuroplexia.   DISCHARGE DIAGNOSIS:  Comminuted fractured right proximal humerus with  avulsion of greater tuberosity, also hyperesthesia, right hand, and  traumatic neuroplexia.   COMPLICATIONS:  None.   INFECTIONS:  None.   OPERATIONS:  Open reduction and internal fixation, right proximal humerus.   HISTORY:  This is a 65 year old female who states that she was trying to  retrieve a racoon from her dog and slipped and fell landing on her right  shoulder.  The patient complained of severe pain and swelling in the right  arm and shoulder with tingling and numbness down the fingers.  Pertinent  physical was that of the right shoulder diffusely swollen and tender, skin  intact.  Hyperesthesias __________ of the right hand.  The patient had good  grip and pinch __________ was intact.  Pulse was intact.  X-rays revealed  comminuted humeral neck fracture with angulation and avulsion of the greater  tuberosity.   HOSPITAL COURSE:  The patient had preoperative laboratories done.  CBC,  CMET, EKG and chest x-ray, UA, which were found to be stable enough for the  patient to undergo surgery.  The patient underwent ORIF of the right humerus  with Rush rod and cancellous screw fixation.  The patient tolerated the  procedure quite well.  The postoperative course was rather benign.  The patient had ice packs, pain  control, encouraged use of the  fingers, gripping exercise.  The patient's  pain was brought under control and was safe to be discharged on Clinoril 200  mg b.i.d., Percocet q.4h. p.r.n. for pain, ice packs, and to return to the  office in one week.  The patient was discharged in stable and satisfactory  condition.                                                Myrtie Neither, M.D.    AC/MEDQ  D:  03/07/2004  T:  03/08/2004  Job:  [id number]

## 2011-04-26 NOTE — Op Note (Signed)
NAME:  Sonya Duffy, Sonya Duffy                           ACCOUNT NO.:  [id number]   MEDICAL RECORD NO.:  [id number]                   PATIENT TYPE:  AMB   LOCATION:  [facility]                                 FACILITY:  [facility]   PHYSICIAN:  Maretta Bees. Vonita Moss, M.D.             DATE OF BIRTH:  21-Feb-1946   DATE OF PROCEDURE:  DATE OF DISCHARGE:                                 OPERATIVE REPORT   PREOPERATIVE DIAGNOSIS:  Rule out interstitial cystitis.   POSTOPERATIVE DIAGNOSIS:  Rule out interstitial cystitis plus urethral  strictures.   PROCEDURE:  1. Cystoscopy.  2. Urethral dilation.  3. Hydraulic dilation of bladder and cold cup bladder biopsies.   SURGEON:  Maretta Bees. Vonita Moss, M.D.   ANESTHESIA:  General.   INDICATIONS:  This 65 year old lady has had long history of frequency,  nocturia, urgency, and bladder pain and pressure.  She has not had adequate  response from the Detrol LA and pelvic pain and symptoms.  The score was  quite high at 24.  She was brought to the OR today for further evaluation to  rule out IC.   DESCRIPTION OF PROCEDURE:  The patient was brought to the operating room and  placed in the lithotomy position and external genitalia were prepped and  draped in the usual fashion.  She was cystoscope only after dilating a tight  urethra to 30 [place] a 25 French cystoscope sheath was inserted and the  bladder had no stones, tumors, or inflammatory lesions.  She held 750 cc of  water and after emptying the bladder she had what looked like Hunner's  ulcers in the anterior wall of the bladder.  She had a few scattered  submucosal petechiae and hemorrhage.  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  Cold cup bladder biopsies were obtained from typical  hemorrhagic areas and the biopsy sites fulgurated with the Bugbee electrode.   The bladder was emptied, the scope removed, and the patient sent to the  recovery room in good condition with no blood loss and good  hemostasis.                                               Maretta Bees. Vonita Moss, M.D.    LJP/MEDQ  D:  05/21/2004  T:  05/21/2004  Job:  [id number]

## 2011-04-26 NOTE — Op Note (Signed)
NAME:  Sonya Duffy, Sonya Duffy NO.:  [id number]   MEDICAL RECORD NO.:  [id number]                   PATIENT TYPE:  INP   LOCATION:  5028                                 FACILITY:  [facility]   PHYSICIAN:  Myrtie Neither, M.D.                 DATE OF BIRTH:  [birth year]   DATE OF PROCEDURE:  DATE OF DISCHARGE:  01/16/2004                                 OPERATIVE REPORT   PREOPERATIVE DIAGNOSIS:  Comminuted humeral neck fracture with fracture of  the greater tuberosity.   POSTOPERATIVE DIAGNOSIS:  Comminuted humeral neck fracture with fracture of  the greater tuberosity.   OPERATION/PROCEDURE:  Open reduction and internal fixation with Rush rod and  cancellous screw, right proximal humerus.   ANESTHESIA:  General.   DESCRIPTION OF PROCEDURE:  The patient was taken to the operating room.  After given adequate ________, given general anesthesia, intubated, the  patient was placed in the barber chair position.  Right shoulder was prepped  with DuraPrep and draped in the sterile manner.  C-arm was used to visualize  the fracture reduction.  Mini shoulder strap incision was made over the  right shoulder, down through the skin and subcutaneous tissue down to the  deltoid muscle which was reflected and incised.  Fracture fragments are  identified and manipulated and reduced in improved position.  Placement of  the Rush rod was down through the proximal humerus down the canal.  A  cancellous screw was used to bring the tuberosity fragment down and fixed in  an improved position.  Irrigation was then done.  Wound closure was then  done with 0 Vicryl for the fascia and muscle, 2-0 Vicryl for the  subcutaneous tissue and skin staples for the skin.  Compressive dressing was  applied and abduction shoulder brace was applied.  The patient tolerated the  procedure well and went to the recovery room in stable and satisfactory  condition.           Myrtie Neither, M.D.    AC/MEDQ  D:  03/07/2004  T:  03/08/2004  Job:  [id number]

## 2011-10-20 ENCOUNTER — Emergency Department (HOSPITAL_COMMUNITY): Payer: Medicare Other

## 2011-10-20 ENCOUNTER — Emergency Department (HOSPITAL_COMMUNITY)
Admission: EM | Admit: 2011-10-20 | Discharge: 2011-10-20 | Disposition: A | Discharge status: Home / Self Care | Payer: Medicare Other | Attending: Emergency Medicine | Admitting: Emergency Medicine

## 2011-10-20 ENCOUNTER — Other Ambulatory Visit: Payer: Self-pay

## 2011-10-20 ENCOUNTER — Encounter: Payer: Self-pay | Admitting: Emergency Medicine

## 2011-10-20 DIAGNOSIS — R05 Cough: Secondary | ICD-10-CM | POA: Insufficient documentation

## 2011-10-20 DIAGNOSIS — R5381 Other malaise: Secondary | ICD-10-CM | POA: Insufficient documentation

## 2011-10-20 DIAGNOSIS — R5383 Other fatigue: Secondary | ICD-10-CM | POA: Insufficient documentation

## 2011-10-20 DIAGNOSIS — K219 Gastro-esophageal reflux disease without esophagitis: Secondary | ICD-10-CM

## 2011-10-20 DIAGNOSIS — Z79899 Other long term (current) drug therapy: Secondary | ICD-10-CM | POA: Insufficient documentation

## 2011-10-20 DIAGNOSIS — R079 Chest pain, unspecified: Secondary | ICD-10-CM | POA: Insufficient documentation

## 2011-10-20 DIAGNOSIS — R059 Cough, unspecified: Secondary | ICD-10-CM | POA: Insufficient documentation

## 2011-10-20 HISTORY — DX: Essential (primary) hypertension: I10

## 2011-10-20 LAB — POCT I-STAT TROPONIN I: Troponin i, poc: 0 ng/mL (ref 0.00–0.08)

## 2011-10-20 LAB — POCT I-STAT, CHEM 8
BUN: 14 mg/dL (ref 6–23)
Calcium, Ion: 1.18 mmol/L (ref 1.12–1.32)
Chloride: 99 mEq/L (ref 96–112)
HCT: 37 % (ref 36.0–46.0)
Potassium: 3.8 mEq/L (ref 3.5–5.1)
Sodium: 136 mEq/L (ref 135–145)

## 2011-10-20 IMAGING — CR DG CHEST 2V
2 series · 2 of 2 positions shown · non-contrast
Comparison: [DATE]

CLINICAL DATA: Chest pain and cough.

CHEST - 2 VIEW

[w chest pa *]
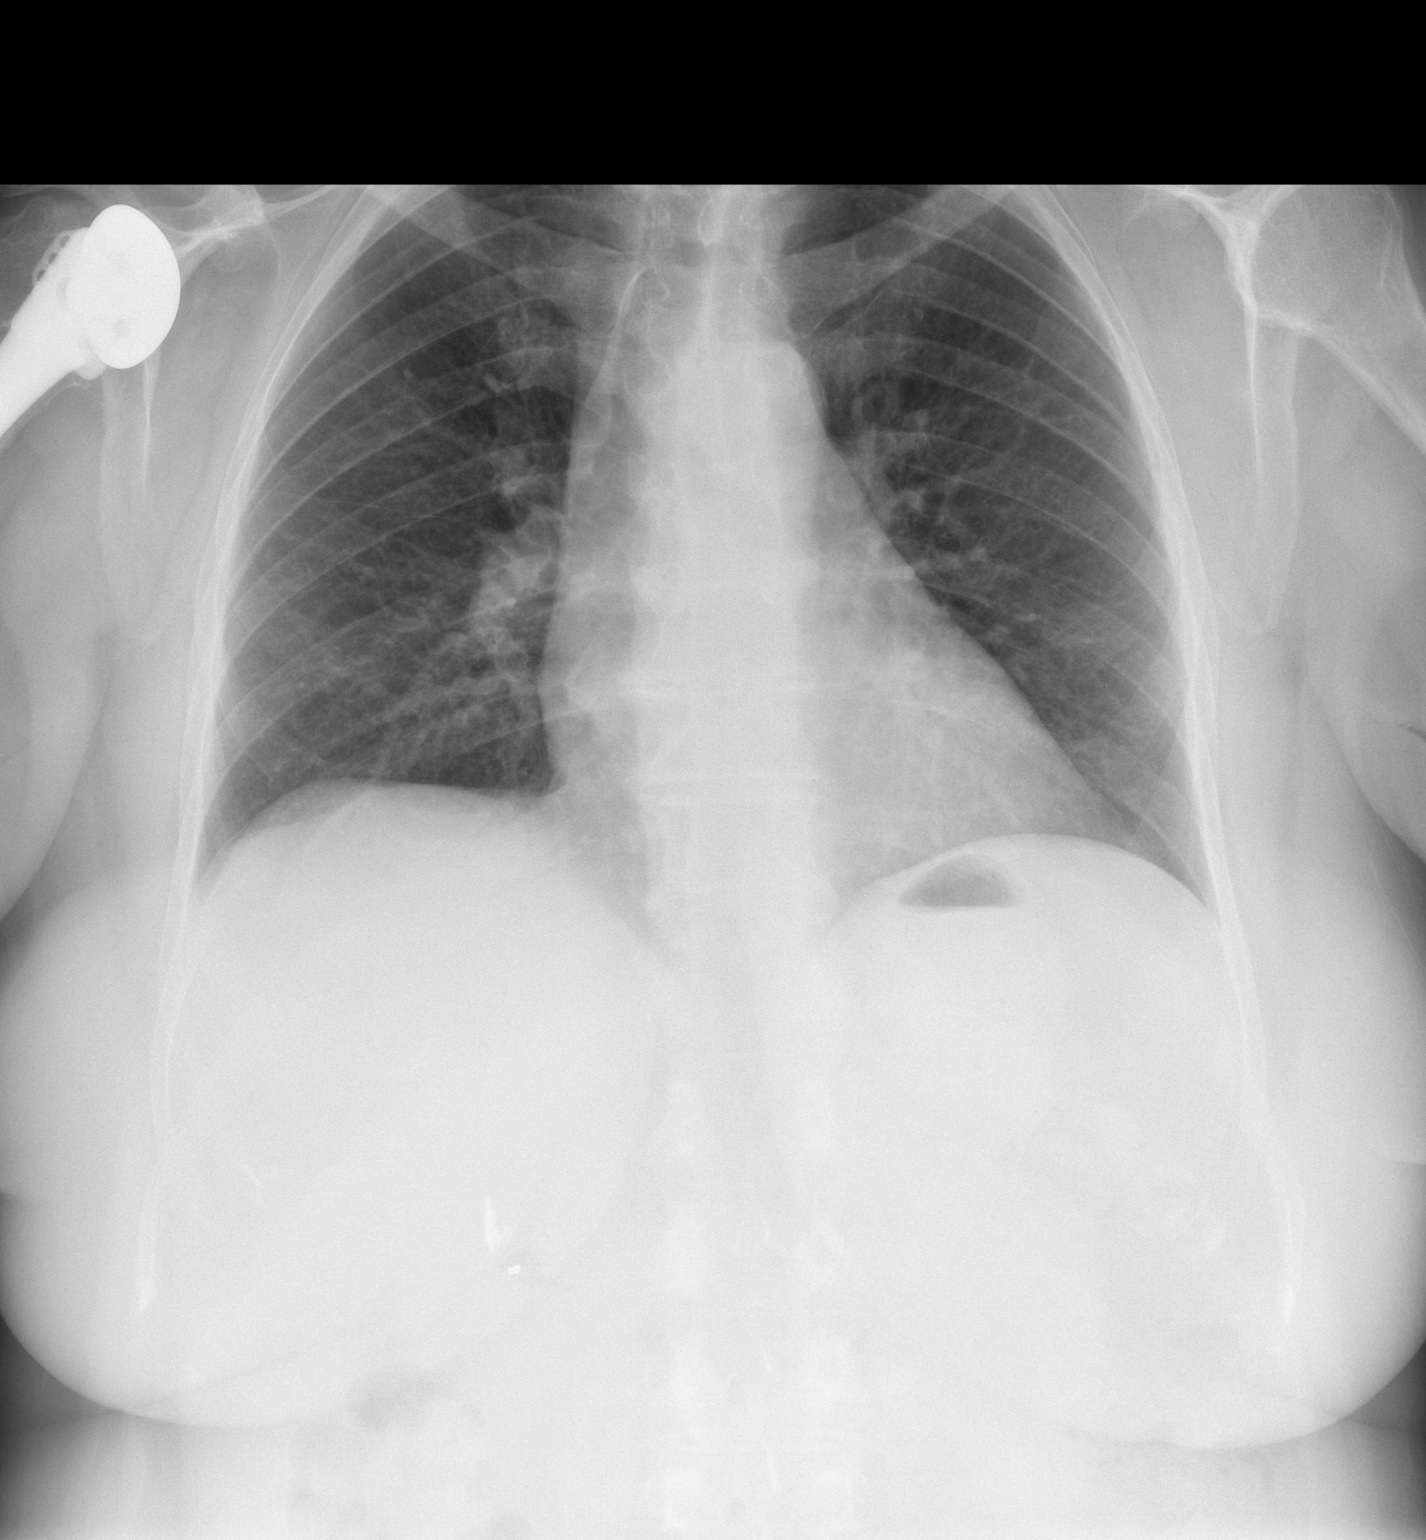

[w chest lat *]
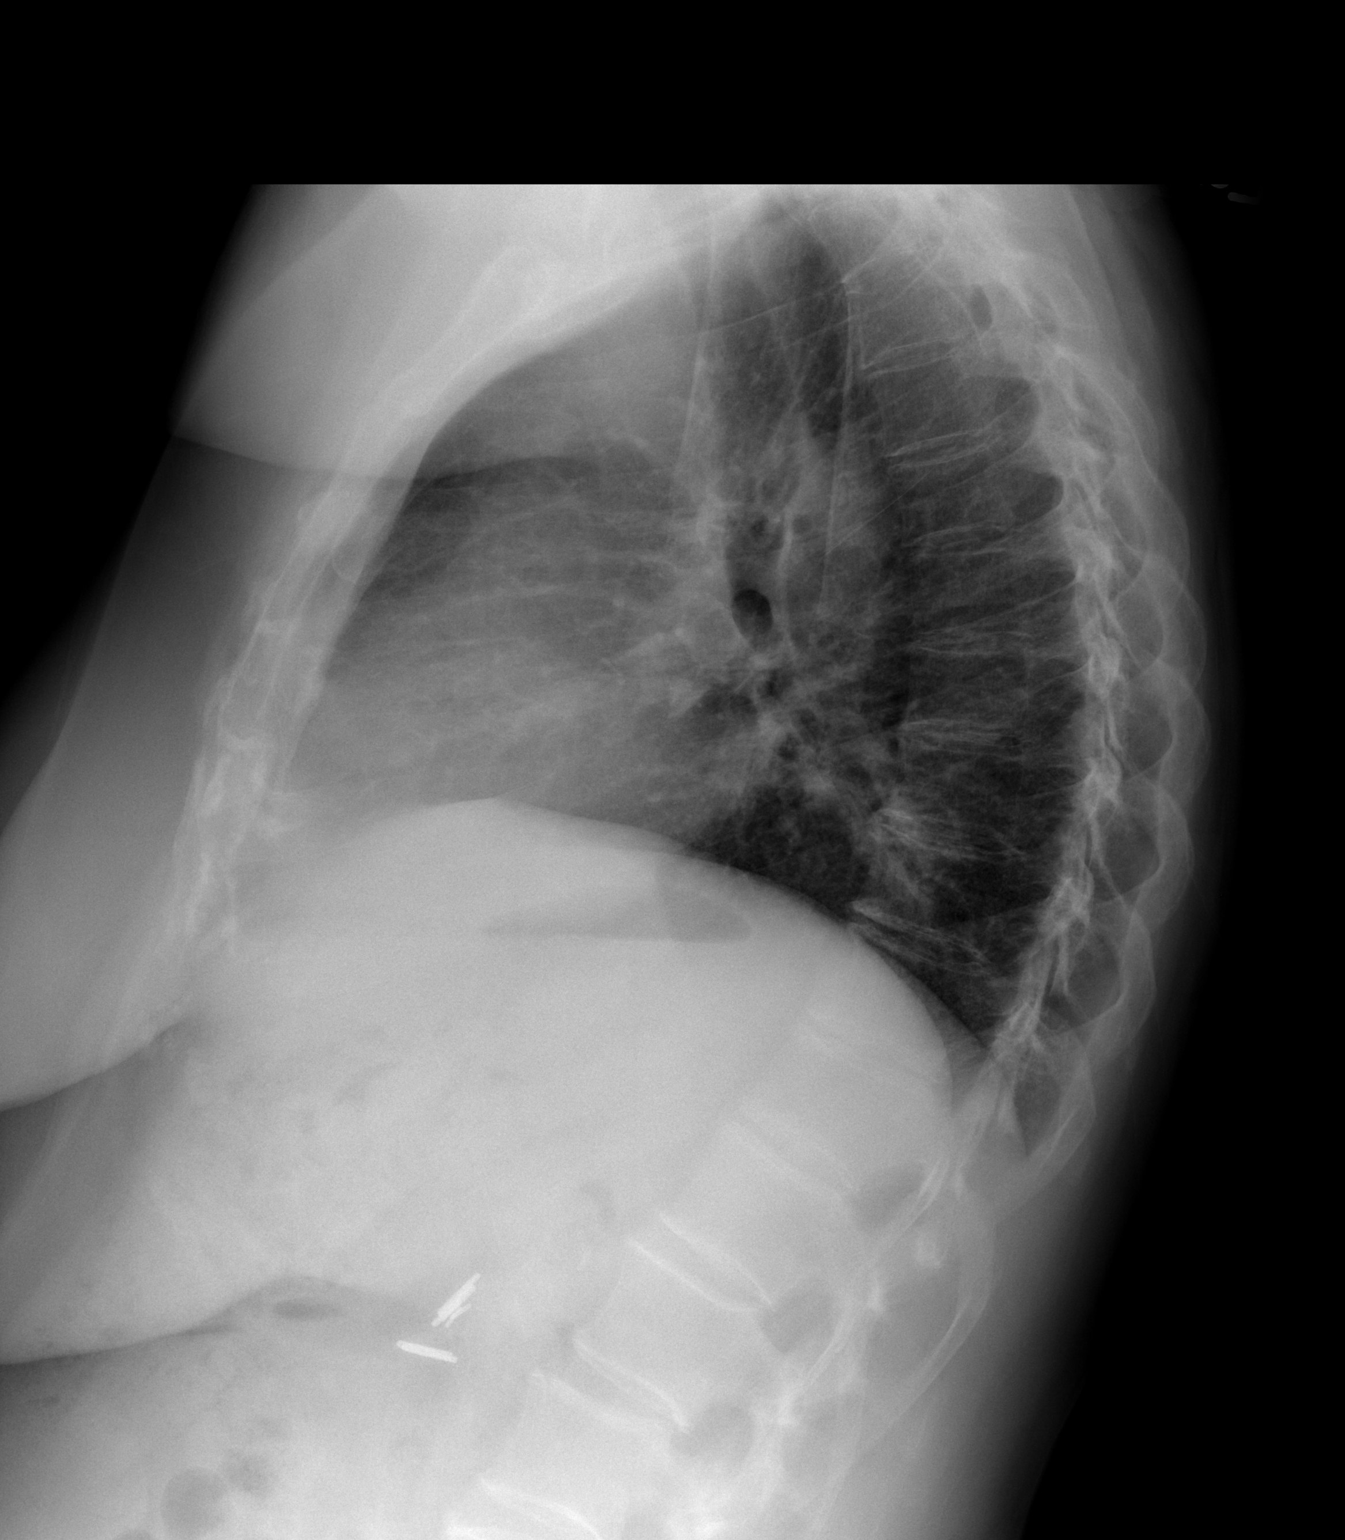

[2 of 2 positions shown; findings below may reference images not displayed]

FINDINGS: The heart size and vascularity are normal and the lungs
are clear.  No acute osseous abnormality.
IMPRESSION: No acute abnormalities.

## 2011-10-20 MED ORDER — OMEPRAZOLE 20 MG PO CPDR: 20.0000 mg | DELAYED_RELEASE_CAPSULE | Freq: Every day | ORAL | Status: DC

## 2011-10-20 NOTE — ED Provider Notes (Signed)
History     CSN: [id number] Arrival date & time: 10/20/2011 12:52 PM   First MD Initiated Contact with Patient 10/20/11 1559      Chief Complaint  Patient presents with  . Fatigue  . Chest Pain    (Consider location/radiation/quality/duration/timing/severity/associated sxs/prior treatment) HPI Patient presents with several day history of epigastric and midsternal chest discomfort.  Feels like her previous symptoms of esophageal reflux.  Patient denies nausea, and vomiting. Diaphoresis.  Patient also has had some fatigue recently but she also relates that to being secondary to work that she's been to her house and recent company that she's had.  Patient has no history coronary artery disease. Past Medical History  Diagnosis Date  . Hypertension   . Diabetes mellitus     Past Surgical History  Procedure Date  . Cholecystectomy     No family history on file.  History  Substance Use Topics  . Smoking status: Never Smoker   . Smokeless tobacco: Not on file  . Alcohol Use: No    OB History    Grav Para Term Preterm Abortions TAB SAB Ect Mult Living                  Review of Systems  Respiratory: Negative for cough.   Cardiovascular: Negative for leg swelling.  Genitourinary: Negative.   Musculoskeletal: Negative.   Neurological: Negative.   Hematological: Negative.   Psychiatric/Behavioral: Negative.   All other systems reviewed and are negative.    Allergies  Review of patient's allergies indicates no known allergies.  Home Medications   Current Outpatient Rx  Name Route Sig Dispense Refill  . GLIPIZIDE ER 10 MG PO TB24 Oral Take 10 mg by mouth daily.      Marland Kitchen OLMESARTAN MEDOXOMIL 40 MG PO TABS Oral Take 40 mg by mouth daily.        BP 136/79  Pulse 91  Temp(Src) 97.6 F (36.4 C) (Oral)  SpO2 97%  Physical Exam  Nursing note and vitals reviewed. Constitutional: She is oriented to person, place, and time. She appears well-developed and  well-nourished. No distress.  HENT:  Head: Normocephalic and atraumatic.  Eyes: Pupils are equal, round, and reactive to light.  Neck: Normal range of motion.  Cardiovascular: Normal rate and intact distal pulses.          Date: 10/20/2011  Rate: 80  Rhythm: normal sinus rhythm  QRS Axis: normal  Intervals: normal  ST/T Wave abnormalities: normal  Conduction Disutrbances:none  Narrative Interpretation: Normal EKG  Old EKG Reviewed: none available     Pulmonary/Chest: Effort normal and breath sounds normal. No respiratory distress.  Abdominal: Normal appearance. She exhibits no distension.  Musculoskeletal: Normal range of motion.  Neurological: She is alert and oriented to person, place, and time. No cranial nerve deficit.  Skin: Skin is warm and dry. No rash noted.  Psychiatric: She has a normal mood and affect. Her behavior is normal.    ED Course  Procedures (including critical care time)  Labs Reviewed  POCT I-STAT, CHEM 8 - Abnormal; Notable for the following:    Glucose, Bld 187 (*)    All other components within normal limits  POCT I-STAT TROPONIN I  I-STAT, CHEM 8  I-STAT TROPONIN I   Dg Chest 2 View  10/20/2011  *RADIOLOGY REPORT*  Clinical Data: Chest pain and cough.  CHEST - 2 VIEW  Comparison: 03/04/2010  Findings: The heart size and vascularity are normal and the lungs are  clear.  No acute osseous abnormality.  IMPRESSION: No acute abnormalities.  Original Report Authenticated By: Gwynn Burly, M.D.     1. GERD (gastroesophageal reflux disease)       MDM          Nelia Shi, MD 10/20/11 1843

## 2011-10-20 NOTE — ED Notes (Signed)
Patient has long history of acid reflux and states that she has been feeling weak for the past few days.  Patient is alert and oriented x 3, in no distress.  States that pain feels like burning in nature. This is a chronic problem.

## 2011-10-20 NOTE — ED Notes (Signed)
Pt reports epigastric pain and fatigue.

## 2012-07-03 ENCOUNTER — Emergency Department (INDEPENDENT_AMBULATORY_CARE_PROVIDER_SITE_OTHER): Payer: Medicare Other

## 2012-07-03 ENCOUNTER — Emergency Department (INDEPENDENT_AMBULATORY_CARE_PROVIDER_SITE_OTHER)
Admission: EM | Admit: 2012-07-03 | Discharge: 2012-07-03 | Disposition: A | Discharge status: Home / Self Care | Payer: Medicare Other | Source: Home / Self Care

## 2012-07-03 ENCOUNTER — Encounter (HOSPITAL_COMMUNITY): Payer: Self-pay | Admitting: Emergency Medicine

## 2012-07-03 DIAGNOSIS — R197 Diarrhea, unspecified: Secondary | ICD-10-CM

## 2012-07-03 DIAGNOSIS — K047 Periapical abscess without sinus: Secondary | ICD-10-CM

## 2012-07-03 LAB — POCT URINALYSIS DIP (DEVICE)
Glucose, UA: 500 mg/dL — AB
Hgb urine dipstick: NEGATIVE
Leukocytes, UA: NEGATIVE
Nitrite: NEGATIVE
Urobilinogen, UA: 0.2 mg/dL (ref 0.0–1.0)
pH: 5 (ref 5.0–8.0)

## 2012-07-03 LAB — POCT I-STAT, CHEM 8
Calcium, Ion: 1.09 mmol/L — ABNORMAL LOW (ref 1.13–1.30)
Glucose, Bld: 306 mg/dL — ABNORMAL HIGH (ref 70–99)
HCT: 42 % (ref 36.0–46.0)
Hemoglobin: 14.3 g/dL (ref 12.0–15.0)
Potassium: 4.5 mEq/L (ref 3.5–5.1)

## 2012-07-03 IMAGING — CR DG CHEST 2V
2 series · 2 of 2 positions shown · non-contrast
Comparison: [DATE]

CLINICAL DATA: Abdominal pain and discomfort, acid reflux,
uncontrollable diarrhea, history hypertension, diabetes

CHEST - 2 VIEW

[view not recorded (1 of 2)]
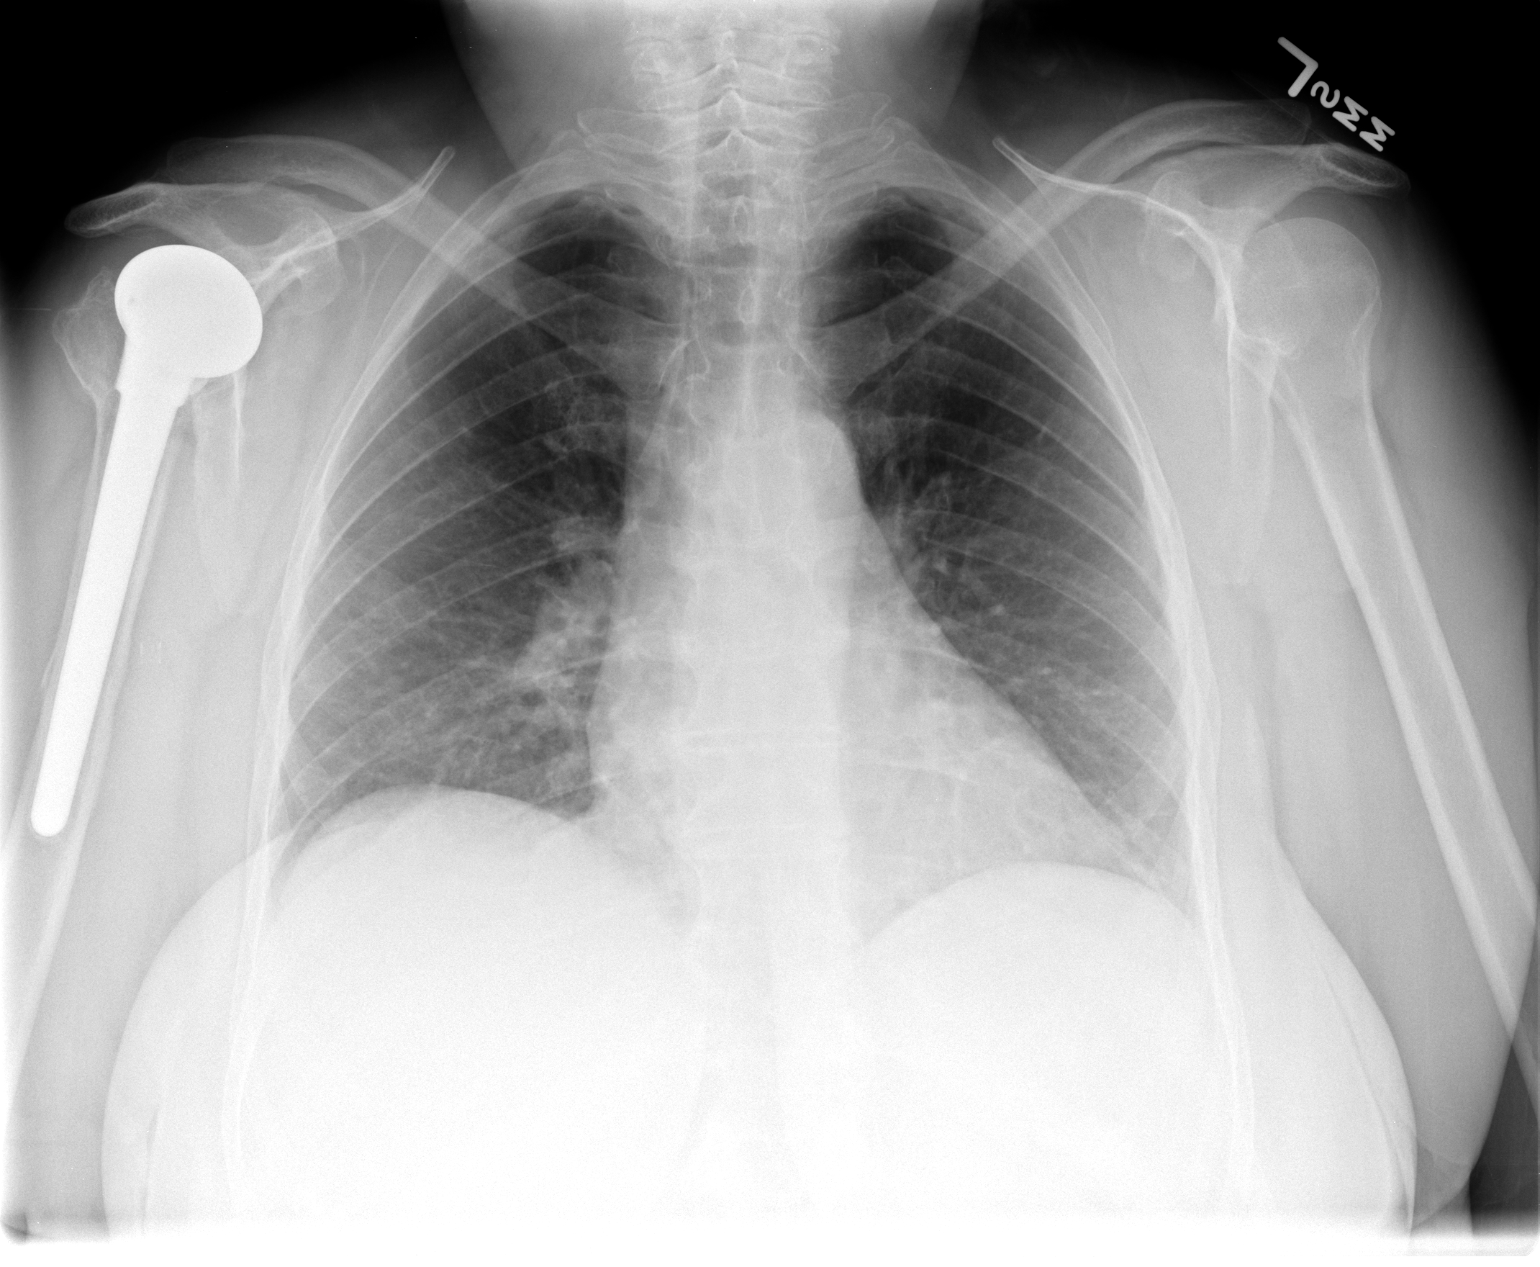

[view not recorded (2 of 2)]
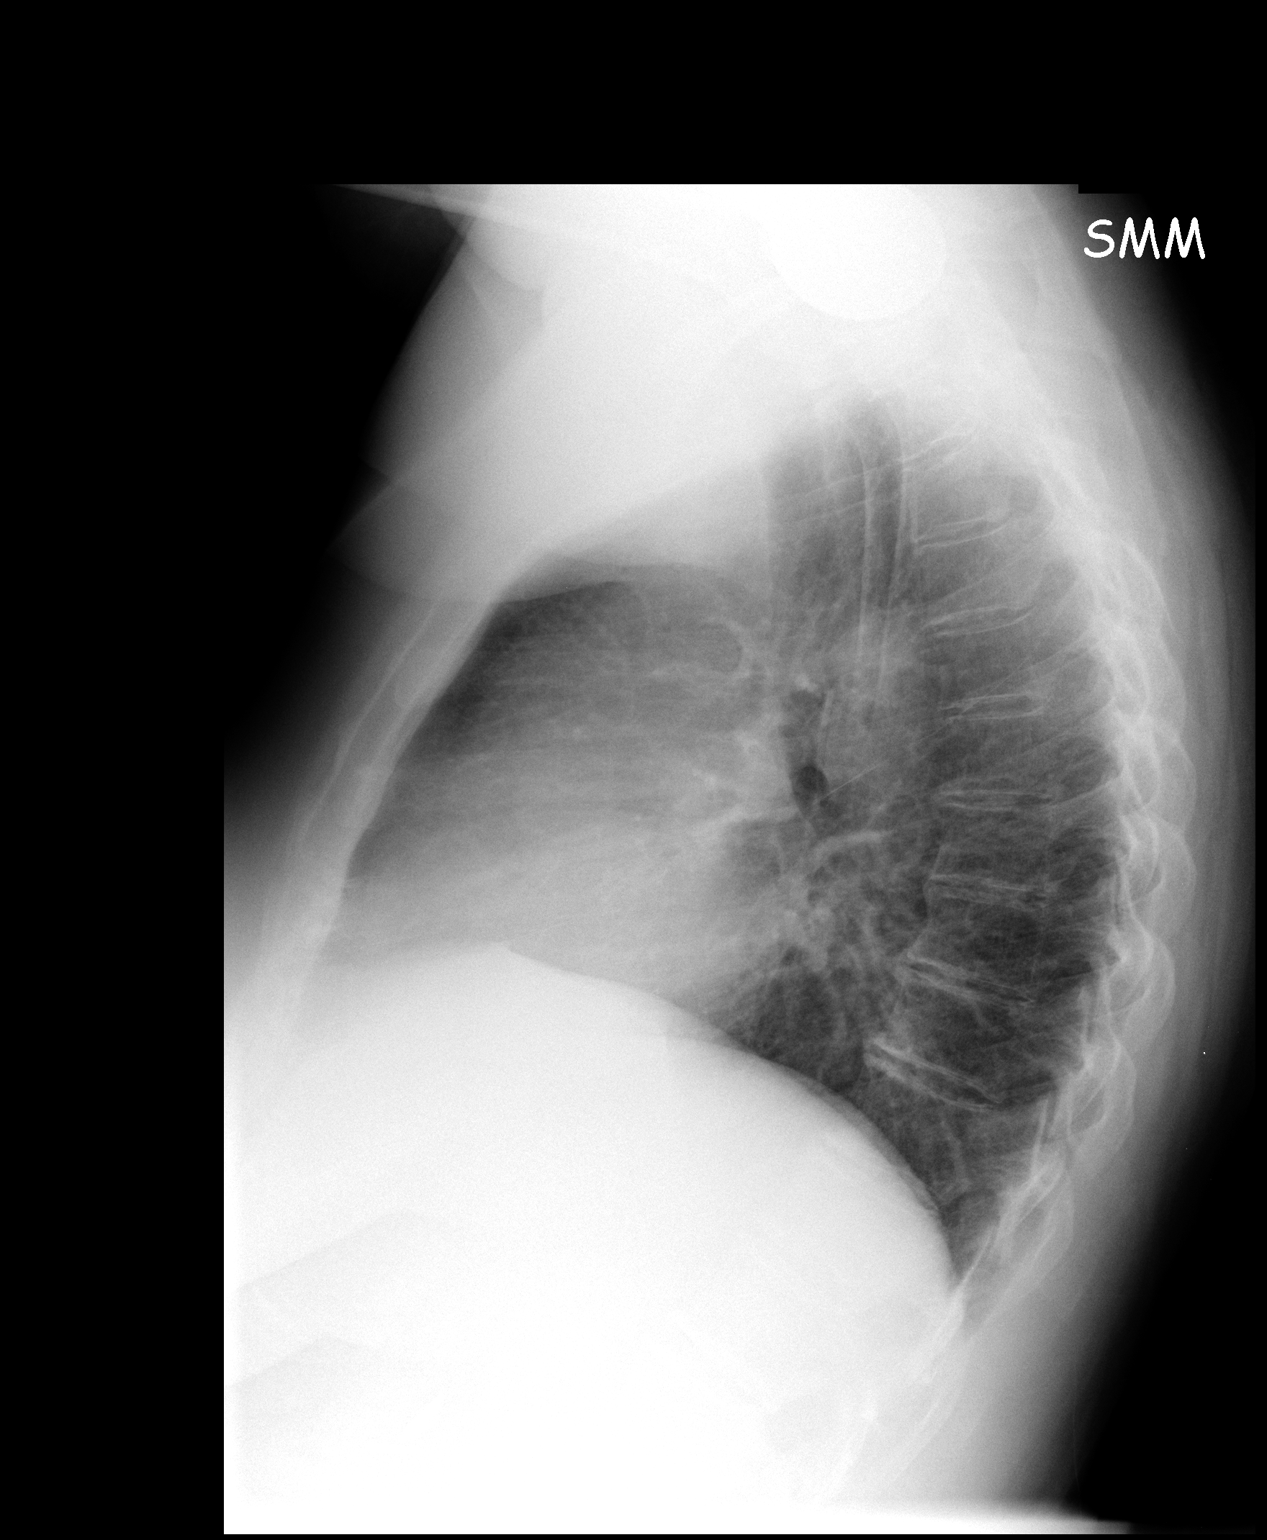

[2 of 2 positions shown; findings below may reference images not displayed]

FINDINGS: Upper normal heart size.
Mediastinal contours and pulmonary vascularity normal.
Lungs clear.
No pleural effusion or pneumothorax.
Surgical clips right upper quadrant question cholecystectomy.
Prior right shoulder joint replacement.
IMPRESSION: No acute abnormalities.

## 2012-07-03 IMAGING — CR DG ABDOMEN 1V
1 series · 1 of 1 positions shown · non-contrast
Comparison: None

CLINICAL DATA: Dental pain.  Diarrhea.

ABDOMEN - 1 VIEW

[view not recorded]
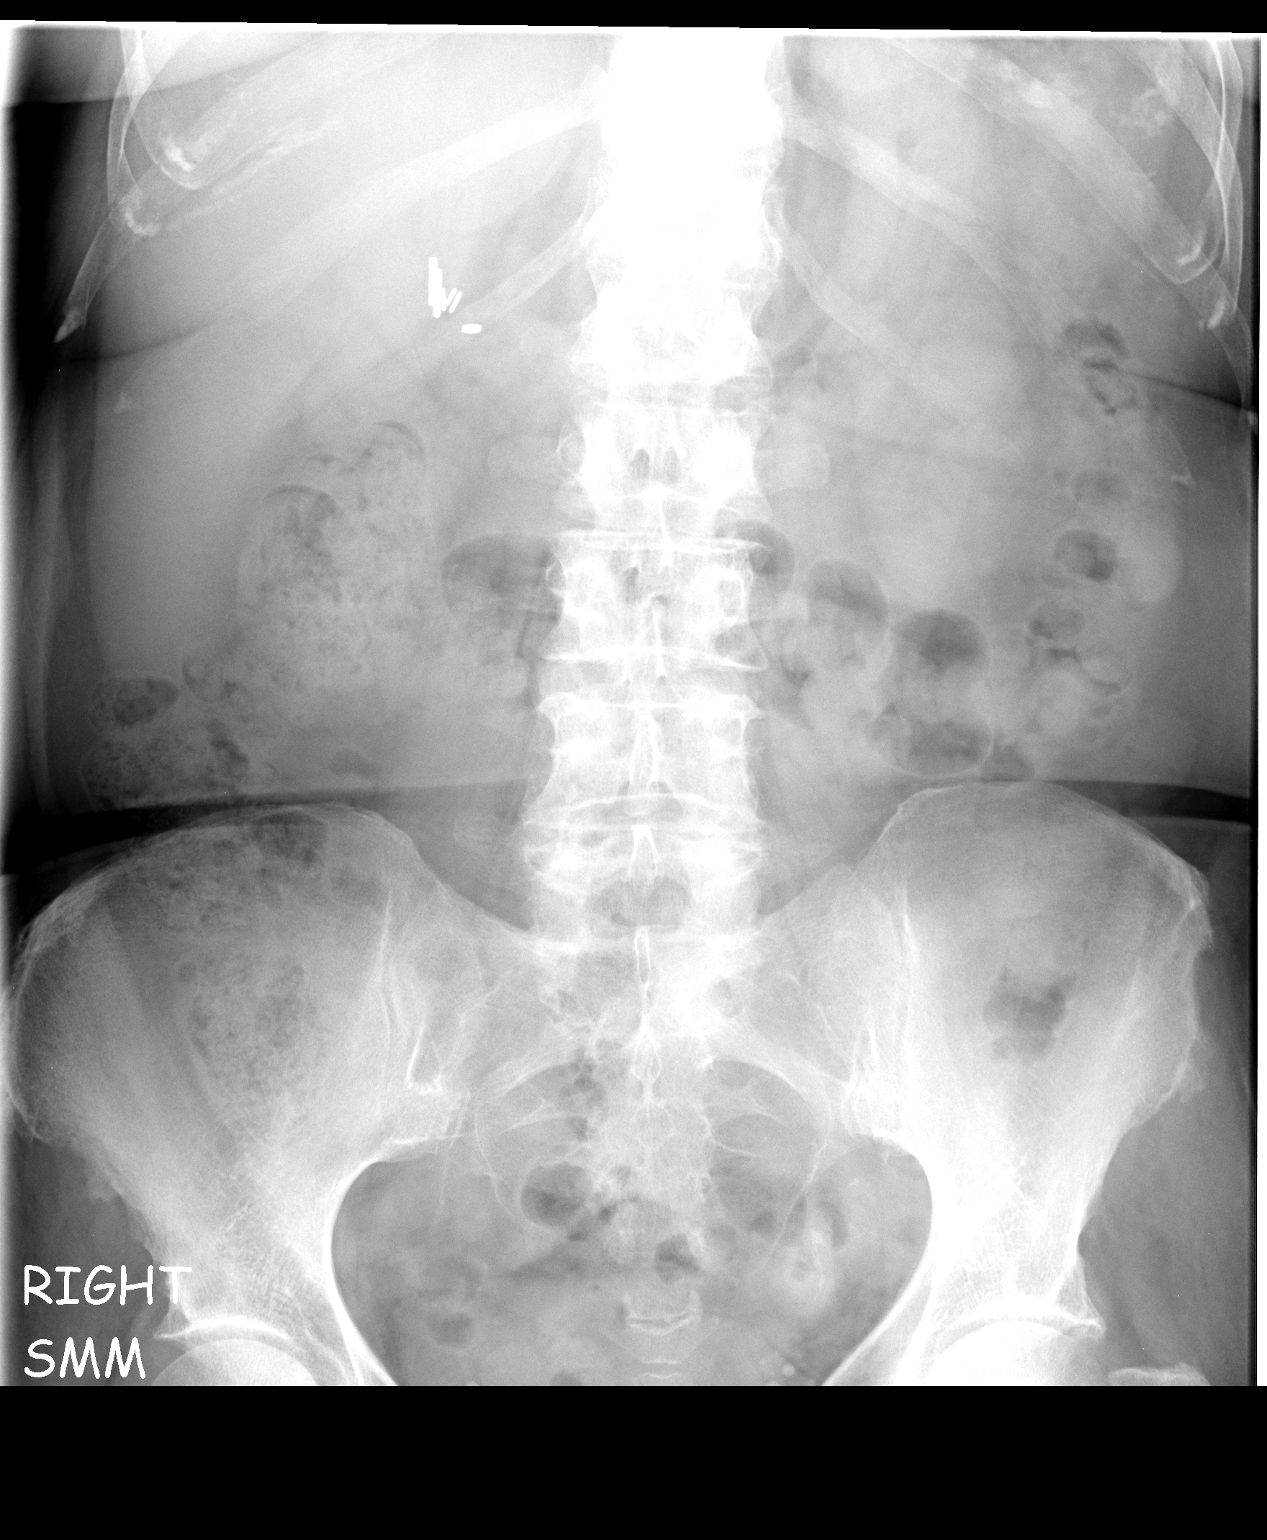

[1 of 1 positions shown; findings below may reference images not displayed]

FINDINGS: There are cholecystectomy clips within the right upper
quadrant of the abdomen.  Moderate stool and gas identified within
the colon.

No dilated loops of small bowel or air-fluid levels identified.
IMPRESSION: 1.  No acute abnormality noted.

## 2012-07-03 MED ORDER — CALCIUM POLYCARBOPHIL 625 MG PO TABS: 625.0000 mg | ORAL_TABLET | Freq: Every day | ORAL | Status: DC

## 2012-07-03 MED ORDER — AMOXICILLIN 500 MG PO CAPS: 500.0000 mg | ORAL_CAPSULE | Freq: Three times a day (TID) | ORAL | Status: DC

## 2012-07-03 MED ORDER — AMOXICILLIN 500 MG PO CAPS: 500.0000 mg | ORAL_CAPSULE | Freq: Three times a day (TID) | ORAL | Status: AC

## 2012-07-03 NOTE — ED Notes (Addendum)
Here with c/o lower gum swelling and poss infection with pain.requesting ATB and dental referral.also reports of episodes of sudden diarrhea WITH incontinence needed to wear a diaper.was given DIPHENOXYLATE-ATROPINE prescribed by pcp but states what is causing it?slight abdominal cramping.sx has been ongoing for 3-4 mnths

## 2012-07-03 NOTE — ED Provider Notes (Signed)
History     CSN: [id number]  Arrival date & time 07/03/12  1259   First MD Initiated Contact with Patient 07/03/12 1352      Chief Complaint  Patient presents with  . Dental Pain  . Diarrhea    (Consider location/radiation/quality/duration/timing/severity/associated sxs/prior treatment) Patient is a 66 y.o. female presenting with tooth pain and diarrhea. The history is provided by the patient.  Dental PainThe primary symptoms include mouth pain. The symptoms began more than 1 week ago. The symptoms are unchanged. The symptoms are new. The symptoms occur constantly.  Additional symptoms include: gum swelling, gum tenderness, purulent gums and taste disturbance. Additional symptoms do not include: trismus, jaw pain, facial swelling, trouble swallowing, pain with swallowing, excessive salivation, smell disturbance, ear pain, hearing loss and swollen glands. Medical issues include: periodontal disease.   Diarrhea The primary symptoms include diarrhea. The illness began 3 to 5 days ago. The onset was sudden. The problem has not changed since onset. The illness is also significant for bloating and tenesmus. The illness does not include chills, anorexia, dysphagia, odynophagia, constipation, back pain or itching. Significant associated medical issues include GERD. Associated medical issues do not include gallstones, liver disease, alcohol abuse, gastric bypass, bowel resection, irritable bowel syndrome or hemorrhoids.  has been taken align previously prescribed by pcp with no relief of symptoms.  Past Medical History  Diagnosis Date  . Hypertension   . Diabetes mellitus     Past Surgical History  Procedure Date  . Cholecystectomy     No family history on file.  History  Substance Use Topics  . Smoking status: Never Smoker   . Smokeless tobacco: Not on file  . Alcohol Use: No    OB History    Grav Para Term Preterm Abortions TAB SAB Ect Mult Living                  Review  of Systems  Constitutional: Negative for chills.  HENT: Negative for hearing loss, ear pain, facial swelling and trouble swallowing.   Gastrointestinal: Positive for diarrhea and bloating. Negative for dysphagia, constipation and anorexia.  Musculoskeletal: Negative for back pain.  Skin: Negative for itching.  All other systems reviewed and are negative.    Allergies  Review of patient's allergies indicates no known allergies.  Home Medications   Current Outpatient Rx  Name Route Sig Dispense Refill  . DIPHENOXYLATE-ATROPINE 2.5-0.025 MG PO TABS Oral Take 1 tablet by mouth 4 (four) times daily as needed.    Marland Kitchen GLIPIZIDE ER 10 MG PO TB24 Oral Take 10 mg by mouth daily.      Marland Kitchen OLMESARTAN MEDOXOMIL 40 MG PO TABS Oral Take 40 mg by mouth daily.      Marland Kitchen OMEPRAZOLE 20 MG PO CPDR Oral Take 1 capsule (20 mg total) by mouth daily. 30 capsule 2    BP 133/60  Pulse 72  Temp 98 F (36.7 C) (Oral)  Resp 20  SpO2 100%  Physical Exam  Nursing note and vitals reviewed. Constitutional: She is oriented to person, place, and time. Vital signs are normal. She appears well-developed and well-nourished. She is active and cooperative.  HENT:  Head: Normocephalic. No trismus in the jaw.  Right Ear: Hearing, tympanic membrane, external ear and ear canal normal.  Left Ear: Hearing, tympanic membrane, external ear and ear canal normal.  Nose: Nose normal. Right sinus exhibits no maxillary sinus tenderness and no frontal sinus tenderness. Left sinus exhibits no maxillary sinus tenderness and  no frontal sinus tenderness.  Mouth/Throat: Uvula is midline, oropharynx is clear and moist and mucous membranes are normal. Abnormal dentition. Dental abscesses and dental caries present. No uvula swelling. No posterior oropharyngeal edema, posterior oropharyngeal erythema or tonsillar abscesses.  Eyes: Conjunctivae are normal. Pupils are equal, round, and reactive to light. No scleral icterus.  Neck: Trachea normal.  Neck supple.  Cardiovascular: Normal rate, regular rhythm, normal heart sounds and normal pulses.   Pulmonary/Chest: Effort normal and breath sounds normal.  Abdominal: Soft. Normal appearance and bowel sounds are normal. There is no hepatosplenomegaly. There is tenderness in the epigastric area and periumbilical area. There is no rebound and no CVA tenderness.  Neurological: She is alert and oriented to person, place, and time. No cranial nerve deficit or sensory deficit.  Skin: Skin is warm and dry.  Psychiatric: She has a normal mood and affect. Her speech is normal and behavior is normal. Judgment and thought content normal. Cognition and memory are normal.    ED Course  Procedures (including critical care time)  Labs Reviewed  POCT URINALYSIS DIP (DEVICE) - Abnormal; Notable for the following:    Glucose, UA 500 (*)     All other components within normal limits  POCT I-STAT, CHEM 8 - Abnormal; Notable for the following:    Sodium 134 (*)     Glucose, Bld 306 (*)     Calcium, Ion 1.09 (*)     All other components within normal limits   Dg Chest 2 View  07/03/2012  *RADIOLOGY REPORT*  Clinical Data: Abdominal pain and discomfort, acid reflux, uncontrollable diarrhea, history hypertension, diabetes  CHEST - 2 VIEW  Comparison: 10/20/2011  Findings: Upper normal heart size. Mediastinal contours and pulmonary vascularity normal. Lungs clear. No pleural effusion or pneumothorax. Surgical clips right upper quadrant question cholecystectomy. Prior right shoulder joint replacement.  IMPRESSION: No acute abnormalities.  Original Report Authenticated By: Lollie Marrow, M.D.   Dg Abd 1 View  07/03/2012  *RADIOLOGY REPORT*  Clinical Data: Dental pain.  Diarrhea.  ABDOMEN - 1 VIEW  Comparison: None  Findings: There are cholecystectomy clips within the right upper quadrant of the abdomen.  Moderate stool and gas identified within the colon.  No dilated loops of small bowel or air-fluid levels  identified.  IMPRESSION:  1.  No acute abnormality noted.  Original Report Authenticated By: Rosealee Albee, M.D.     1. Periapical abscess   2. Diarrhea       MDM  Increase fluids, take antibiotics as prescribed.  citrucel daily.    Johnsie Kindred, NP 07/03/12 1629

## 2012-07-03 NOTE — ED Notes (Signed)
No order for meds on e-mail; repeats given [facility] at [facility] ,[place]

## 2012-07-03 NOTE — ED Provider Notes (Signed)
Medical screening examination/treatment/procedure(s) were performed by non-physician practitioner and as supervising physician I was immediately available for consultation/collaboration.  Leslee Home, M.D.   Reuben Likes, MD 07/03/12 2141

## 2012-10-15 ENCOUNTER — Encounter (HOSPITAL_COMMUNITY): Payer: Self-pay | Admitting: Emergency Medicine

## 2012-10-15 DIAGNOSIS — Z79899 Other long term (current) drug therapy: Secondary | ICD-10-CM | POA: Insufficient documentation

## 2012-10-15 DIAGNOSIS — E1169 Type 2 diabetes mellitus with other specified complication: Secondary | ICD-10-CM | POA: Insufficient documentation

## 2012-10-15 DIAGNOSIS — I10 Essential (primary) hypertension: Secondary | ICD-10-CM | POA: Insufficient documentation

## 2012-10-15 LAB — COMPREHENSIVE METABOLIC PANEL
AST: 31 U/L (ref 0–37)
Albumin: 4.3 g/dL (ref 3.5–5.2)
Alkaline Phosphatase: 104 U/L (ref 39–117)
Chloride: 94 mEq/L — ABNORMAL LOW (ref 96–112)
Potassium: 4.6 mEq/L (ref 3.5–5.1)
Sodium: 131 mEq/L — ABNORMAL LOW (ref 135–145)
Total Bilirubin: 0.3 mg/dL (ref 0.3–1.2)

## 2012-10-15 LAB — URINALYSIS, ROUTINE W REFLEX MICROSCOPIC
Glucose, UA: >1000 mg/dL — AB
Hgb urine dipstick: NEGATIVE
Ketones, ur: 15 mg/dL — AB
Protein, ur: NEGATIVE mg/dL

## 2012-10-15 LAB — CBC WITH DIFFERENTIAL/PLATELET
Basophils Absolute: 0 10*3/uL (ref 0.0–0.1)
Basophils Relative: 0 % (ref 0–1)
MCHC: 33.7 g/dL (ref 30.0–36.0)
Neutro Abs: 6.1 10*3/uL (ref 1.7–7.7)
Neutrophils Relative %: 86 % — ABNORMAL HIGH (ref 43–77)
RDW: 12.6 % (ref 11.5–15.5)

## 2012-10-15 LAB — GLUCOSE, CAPILLARY: Glucose-Capillary: 342 mg/dL — ABNORMAL HIGH (ref 70–99)

## 2012-10-15 NOTE — ED Notes (Signed)
  CBG 342  

## 2012-10-15 NOTE — ED Notes (Addendum)
Patient complaining of high glucose readings today -- 409, 406, 376.  Patient reports that she is "full of energy" and is "wound up".  Patient feels that she can do anything.  Denies any other symptoms.  Denies jitteriness; denies hyperglycemia symptoms.  Patient alert and oriented x4; PERRL present.  Patient feels that her meters are old and readings might not be accurate.  Reports that she recently had steroid injections.

## 2012-10-16 ENCOUNTER — Emergency Department (HOSPITAL_COMMUNITY)
Admission: EM | Admit: 2012-10-16 | Discharge: 2012-10-16 | Disposition: A | Discharge status: Home / Self Care | Payer: Medicare Other | Attending: Emergency Medicine | Admitting: Emergency Medicine

## 2012-10-16 DIAGNOSIS — R739 Hyperglycemia, unspecified: Secondary | ICD-10-CM

## 2012-10-16 LAB — URINE MICROSCOPIC-ADD ON

## 2012-10-16 MED ORDER — SODIUM CHLORIDE 0.9 % IV BOLUS (SEPSIS)
1000.0000 mL | Freq: Once | INTRAVENOUS | Status: AC
  Administered 2012-10-16: 1000 mL via INTRAVENOUS

## 2012-10-16 MED ORDER — INSULIN ASPART 100 UNIT/ML ~~LOC~~ SOLN
6.0000 [IU] | Freq: Once | SUBCUTANEOUS | Status: AC
  Administered 2012-10-16: 6 [IU] via INTRAVENOUS
  Filled 2012-10-16: qty 1

## 2012-10-16 NOTE — ED Provider Notes (Signed)
History     CSN: [id number]  Arrival date & time 10/15/12  2034   First MD Initiated Contact with Patient 10/16/12 0015      Chief Complaint  Patient presents with  . Hyperglycemia    (Consider location/radiation/quality/duration/timing/severity/associated sxs/prior treatment) The history is provided by the patient.  pt states hx niddm, on glipizide and metformin. States bs had been running in 200-300 range. States had steroid injection in back today and blood sugar was 400 subsequently. States was told to come to er. Feels well otherwise. Denies polyuria or polydipsia. No dysuria. No nvd. No fever or chills. No faintness or lightheadedness. States compliant w meds. Normal appetite. Normal po intake. Not following any specific diabetic diet. Other than bs being high states feels at baseline.   Past Medical History  Diagnosis Date  . Hypertension   . Diabetes mellitus     Past Surgical History  Procedure Date  . Cholecystectomy     History reviewed. No pertinent family history.  History  Substance Use Topics  . Smoking status: Never Smoker   . Smokeless tobacco: Not on file  . Alcohol Use: No    OB History    Grav Para Term Preterm Abortions TAB SAB Ect Mult Living                  Review of Systems  Constitutional: Negative for fever and chills.  HENT: Negative for neck pain.   Eyes: Negative for visual disturbance.  Respiratory: Negative for shortness of breath.   Cardiovascular: Negative for chest pain.  Gastrointestinal: Negative for vomiting, abdominal pain and diarrhea.  Genitourinary: Negative for flank pain.  Musculoskeletal: Negative for back pain.  Skin: Negative for rash.  Neurological: Negative for headaches.  Hematological: Does not bruise/bleed easily.  Psychiatric/Behavioral: Negative for confusion.    Allergies  Review of patient's allergies indicates no known allergies.  Home Medications   Current Outpatient Rx  Name  Route  Sig   Dispense  Refill  . DIPHENOXYLATE-ATROPINE 2.5-0.025 MG PO TABS   Oral   Take 1 tablet by mouth 4 (four) times daily as needed.         Marland Kitchen GLIPIZIDE ER 10 MG PO TB24   Oral   Take 10 mg by mouth daily.           Marland Kitchen OLMESARTAN MEDOXOMIL 40 MG PO TABS   Oral   Take 40 mg by mouth daily.           Marland Kitchen OMEPRAZOLE 20 MG PO CPDR   Oral   Take 1 capsule (20 mg total) by mouth daily.   30 capsule   2   . CALCIUM POLYCARBOPHIL 625 MG PO TABS   Oral   Take 1 tablet (625 mg total) by mouth daily.   30 tablet   1     BP 148/81  Pulse 96  Temp 97.4 F (36.3 C) (Oral)  Resp 20  SpO2 95%  Physical Exam  Nursing note and vitals reviewed. Constitutional: She is oriented to person, place, and time. She appears well-developed and well-nourished. No distress.  HENT:  Nose: Nose normal.  Mouth/Throat: Oropharynx is clear and moist.  Eyes: Conjunctivae normal are normal. No scleral icterus.  Neck: Neck supple. No tracheal deviation present.  Cardiovascular: Normal rate, regular rhythm, normal heart sounds and intact distal pulses.   Pulmonary/Chest: Effort normal and breath sounds normal. No respiratory distress.  Abdominal: Soft. Normal appearance and bowel sounds are normal. She  exhibits no distension and no mass. There is no tenderness. There is no rebound and no guarding.  Musculoskeletal: She exhibits no edema and no tenderness.  Neurological: She is alert and oriented to person, place, and time.  Skin: Skin is warm and dry. No rash noted.  Psychiatric: She has a normal mood and affect.    ED Course  Procedures (including critical care time)  Results for orders placed during the hospital encounter of 10/16/12  CBC WITH DIFFERENTIAL      Component Value Range   WBC 7.1  4.0 - 10.5 K/uL   RBC 4.16  3.87 - 5.11 MIL/uL   Hemoglobin 12.4  12.0 - 15.0 g/dL   HCT 40.9  81.1 - 91.4 %   MCV 88.5  78.0 - 100.0 fL   MCH 29.8  26.0 - 34.0 pg   MCHC 33.7  30.0 - 36.0 g/dL   RDW  78.2  95.6 - 21.3 %   Platelets 254  150 - 400 K/uL   Neutrophils Relative 86 (*) 43 - 77 %   Neutro Abs 6.1  1.7 - 7.7 K/uL   Lymphocytes Relative 14  12 - 46 %   Lymphs Abs 1.0  0.7 - 4.0 K/uL   Monocytes Relative 0 (*) 3 - 12 %   Monocytes Absolute 0.0 (*) 0.1 - 1.0 K/uL   Eosinophils Relative 0  0 - 5 %   Eosinophils Absolute 0.0  0.0 - 0.7 K/uL   Basophils Relative 0  0 - 1 %   Basophils Absolute 0.0  0.0 - 0.1 K/uL  COMPREHENSIVE METABOLIC PANEL      Component Value Range   Sodium 131 (*) 135 - 145 mEq/L   Potassium 4.6  3.5 - 5.1 mEq/L   Chloride 94 (*) 96 - 112 mEq/L   CO2 21  19 - 32 mEq/L   Glucose, Bld 362 (*) 70 - 99 mg/dL   BUN 25 (*) 6 - 23 mg/dL   Creatinine, Ser 0.86  0.50 - 1.10 mg/dL   Calcium 57.8  8.4 - 46.9 mg/dL   Total Protein 8.3  6.0 - 8.3 g/dL   Albumin 4.3  3.5 - 5.2 g/dL   AST 31  0 - 37 U/L   ALT 30  0 - 35 U/L   Alkaline Phosphatase 104  39 - 117 U/L   Total Bilirubin 0.3  0.3 - 1.2 mg/dL   GFR calc non Af Amer 63 (*) >90 mL/min   GFR calc Af Amer 72 (*) >90 mL/min  URINALYSIS, ROUTINE W REFLEX MICROSCOPIC      Component Value Range   Color, Urine YELLOW  YELLOW   APPearance CLEAR  CLEAR   Specific Gravity, Urine 1.025  1.005 - 1.030   pH 5.5  5.0 - 8.0   Glucose, UA >1000 (*) NEGATIVE mg/dL   Hgb urine dipstick NEGATIVE  NEGATIVE   Bilirubin Urine NEGATIVE  NEGATIVE   Ketones, ur 15 (*) NEGATIVE mg/dL   Protein, ur NEGATIVE  NEGATIVE mg/dL   Urobilinogen, UA 0.2  0.0 - 1.0 mg/dL   Nitrite NEGATIVE  NEGATIVE   Leukocytes, UA NEGATIVE  NEGATIVE  GLUCOSE, CAPILLARY      Component Value Range   Glucose-Capillary 342 (*) 70 - 99 mg/dL  URINE MICROSCOPIC-ADD ON      Component Value Range   Squamous Epithelial / LPF RARE  RARE   WBC, UA 0-2  <3 WBC/hpf   RBC / HPF 0-2  <  3 RBC/hpf   Bacteria, UA RARE  RARE  GLUCOSE, CAPILLARY      Component Value Range   Glucose-Capillary 317 (*) 70 - 99 mg/dL  GLUCOSE, CAPILLARY      Component Value  Range   Glucose-Capillary 233 (*) 70 - 99 mg/dL   s  MDM  Iv ns bolus. Insulin sq.   Reviewed nursing notes and prior charts for additional history.    Pt drinking fluids, states feels at baseline, blood sugar improved.        Suzi Roots, MD 10/16/12 [phone]

## 2012-10-16 NOTE — ED Notes (Addendum)
Pt reports elevated blood sugar and an abnormal increase in energy. Pt reports receiving a Steroid injection today for lower back pain d/t "pinch nerves." Pt reports increase walking today, sts "I walked for 30 mins today b/c I was told if I walked a lot it would bring my sugar down." Pt a/o x4, PEERL, pt w/pressured speech. Pt also reports excessive thirst today.

## 2015-06-13 ENCOUNTER — Encounter: Payer: Self-pay | Admitting: *Deleted

## 2015-06-22 ENCOUNTER — Encounter: Payer: Self-pay | Admitting: Obstetrics and Gynecology

## 2019-09-14 ENCOUNTER — Emergency Department (HOSPITAL_COMMUNITY)
Admission: EM | Admit: 2019-09-14 | Discharge: 2019-09-14 | Disposition: A | Discharge status: Home / Self Care | Payer: Medicare Other | Attending: Emergency Medicine | Admitting: Emergency Medicine

## 2019-09-14 ENCOUNTER — Encounter (HOSPITAL_COMMUNITY): Payer: Self-pay

## 2019-09-14 DIAGNOSIS — N3000 Acute cystitis without hematuria: Secondary | ICD-10-CM | POA: Diagnosis not present

## 2019-09-14 DIAGNOSIS — R55 Syncope and collapse: Secondary | ICD-10-CM | POA: Diagnosis present

## 2019-09-14 DIAGNOSIS — L304 Erythema intertrigo: Secondary | ICD-10-CM | POA: Diagnosis not present

## 2019-09-14 DIAGNOSIS — Z79899 Other long term (current) drug therapy: Secondary | ICD-10-CM | POA: Insufficient documentation

## 2019-09-14 DIAGNOSIS — I10 Essential (primary) hypertension: Secondary | ICD-10-CM | POA: Insufficient documentation

## 2019-09-14 DIAGNOSIS — E1165 Type 2 diabetes mellitus with hyperglycemia: Secondary | ICD-10-CM | POA: Insufficient documentation

## 2019-09-14 DIAGNOSIS — Z7984 Long term (current) use of oral hypoglycemic drugs: Secondary | ICD-10-CM | POA: Insufficient documentation

## 2019-09-14 DIAGNOSIS — R739 Hyperglycemia, unspecified: Secondary | ICD-10-CM

## 2019-09-14 LAB — URINALYSIS, ROUTINE W REFLEX MICROSCOPIC
Bilirubin Urine: NEGATIVE
Glucose, UA: >=500 mg/dL — AB
Ketones, ur: 5 mg/dL — AB
Nitrite: NEGATIVE
Protein, ur: 100 mg/dL — AB
Specific Gravity, Urine: 1.022 (ref 1.005–1.030)
WBC, UA: >50 WBC/hpf — ABNORMAL HIGH (ref 0–5)
pH: 5 (ref 5.0–8.0)

## 2019-09-14 LAB — CBC
HCT: 38.6 % (ref 36.0–46.0)
Hemoglobin: 12.5 g/dL (ref 12.0–15.0)
MCH: 29.1 pg (ref 26.0–34.0)
MCHC: 32.4 g/dL (ref 30.0–36.0)
MCV: 90 fL (ref 80.0–100.0)
Platelets: 418 10*3/uL — ABNORMAL HIGH (ref 150–400)
RBC: 4.29 MIL/uL (ref 3.87–5.11)
RDW: 13.2 % (ref 11.5–15.5)
WBC: 9.6 10*3/uL (ref 4.0–10.5)
nRBC: 0 % (ref 0.0–0.2)

## 2019-09-14 LAB — BASIC METABOLIC PANEL
Anion gap: 15 (ref 5–15)
BUN: 12 mg/dL (ref 8–23)
CO2: 20 mmol/L — ABNORMAL LOW (ref 22–32)
Calcium: 9.6 mg/dL (ref 8.9–10.3)
Chloride: 98 mmol/L (ref 98–111)
Creatinine, Ser: 1.17 mg/dL — ABNORMAL HIGH (ref 0.44–1.00)
GFR calc Af Amer: 53 mL/min — ABNORMAL LOW (ref >60)
GFR calc non Af Amer: 46 mL/min — ABNORMAL LOW (ref >60)
Glucose, Bld: 392 mg/dL — ABNORMAL HIGH (ref 70–99)
Potassium: 4.2 mmol/L (ref 3.5–5.1)
Sodium: 133 mmol/L — ABNORMAL LOW (ref 135–145)

## 2019-09-14 LAB — TYPE AND SCREEN
ABO/RH(D): A NEG
Antibody Screen: NEGATIVE

## 2019-09-14 LAB — POC OCCULT BLOOD, ED: Fecal Occult Bld: NEGATIVE

## 2019-09-14 LAB — ABO/RH: ABO/RH(D): A NEG

## 2019-09-14 LAB — CBG MONITORING, ED: Glucose-Capillary: 358 mg/dL — ABNORMAL HIGH (ref 70–99)

## 2019-09-14 MED ORDER — FLUCONAZOLE 200 MG PO TABS: 200.0000 mg | ORAL_TABLET | ORAL | 0 refills | Status: AC

## 2019-09-14 MED ORDER — LACTATED RINGERS IV BOLUS
1000.0000 mL | Freq: Once | INTRAVENOUS | Status: AC
  Administered 2019-09-14: 1000 mL via INTRAVENOUS

## 2019-09-14 MED ORDER — CEPHALEXIN 500 MG PO CAPS: 500.0000 mg | ORAL_CAPSULE | Freq: Three times a day (TID) | ORAL | 0 refills | Status: DC

## 2019-09-14 MED ORDER — INSULIN ASPART 100 UNIT/ML ~~LOC~~ SOLN
10.0000 [IU] | Freq: Once | SUBCUTANEOUS | Status: AC
  Administered 2019-09-14: 13:00:00 10 [IU] via INTRAVENOUS
  Filled 2019-09-14: qty 0.1

## 2019-09-14 MED ORDER — SODIUM CHLORIDE 0.9 % IV SOLN
1.0000 g | Freq: Once | INTRAVENOUS | Status: AC
  Administered 2019-09-14: 1 g via INTRAVENOUS
  Filled 2019-09-14: qty 10

## 2019-09-14 NOTE — ED Notes (Signed)
An After Visit Summary was printed and given to the patient. Discharge instructions given and no further questions at this time, pt leaving with daughter.  

## 2019-09-14 NOTE — ED Triage Notes (Addendum)
Patient dropped off by daughter.    Patient reports CBG over 500 this morning.  Patient states she almost fell yesterday and today but denies falling. Patient states she has near syncope episodes but does not pass out.   Patient reports having black stools this morning.    Patient ambulatory but, unsteady in triage. Patient placed in wheelchair.    A/Ox4 [id number]

## 2019-09-14 NOTE — ED Provider Notes (Signed)
[facility] DEPT Provider Note   CSN: [id number] Arrival date & time: 09/14/19  1023     History   Chief Complaint Chief Complaint  Patient presents with  . Hyperglycemia  . Near Syncope  . Melena    HPI Sonya Duffy is a 73 y.o. female.     HPI   73 year old female with near syncope.  Intermittent symptoms in the past 2 weeks.  Typically gets it when she is on her feet and ambulating.  It will resolve when she steadies herself and stand still.  She does not remember ever getting the symptoms while at rest.  Since yesterday she has been having diarrhea as well.  Today her stool has been black in color.  She is not anticoagulated.  Denies any abdominal pain.  No fevers or chills.  Denies any history of GI bleeding that she is aware of.  She additionally states that her blood sugars have been "all over the place."  She reports compliance with her metformin and glipizide.  Past Medical History:  Diagnosis Date  . Diabetes mellitus   . Hypertension     There are no active problems to display for this patient.   Past Surgical History:  Procedure Laterality Date  . CHOLECYSTECTOMY       OB History   No obstetric history on file.      Home Medications    Prior to Admission medications   Medication Sig Start Date End Date Taking? Authorizing Provider  amoxicillin-clavulanate (AUGMENTIN) 875-125 MG tablet Take 1 tablet by mouth 2 (two) times daily. 7 day supply 09/03/19   [provider]  diphenoxylate-atropine (LOMOTIL) 2.5-0.025 MG tablet Take 1 tablet by mouth every 6 (six) hours as needed for diarrhea or loose stools. 09/06/19   [provider]  glipiZIDE (GLUCOTROL XL) 10 MG 24 hr tablet Take 10 mg by mouth daily.      [provider]  losartan (COZAAR) 50 MG tablet Take 50 mg by mouth daily. 08/17/19   [provider]  metFORMIN (GLUCOPHAGE) 1000 MG tablet Take 1,000 mg by mouth 2 (two) times daily.  08/26/19   [provider]  metFORMIN (GLUCOPHAGE) 500 MG tablet Take 500 mg by mouth 2 (two) times daily with a meal.    [provider]  olmesartan (BENICAR) 40 MG tablet Take 40 mg by mouth daily.      [provider]  PREMARIN vaginal cream Place 1 g vaginally 3 (three) times a week. 09/10/19   [provider]  traMADol (ULTRAM) 50 MG tablet Take 50 mg by mouth every 6 (six) hours as needed for pain. 08/18/19   [provider]    Family History History reviewed. No pertinent family history.  Social History Social History   Tobacco Use  . Smoking status: Never Smoker  . Smokeless tobacco: Never Used  Substance Use Topics  . Alcohol use: No  . Drug use: No     Allergies   Patient has no known allergies.   Review of Systems Review of Systems All systems reviewed and negative, other than as noted in HPI.   Physical Exam Updated Vital Signs BP (!) 141/83 (BP Location: Left Arm)   Pulse (!) 102   Temp 98.2 F (36.8 C) (Oral)   Resp 18   SpO2 100%   Physical Exam Vitals signs and nursing note reviewed.  Constitutional:      General: She is not in acute distress.  Appearance: She is well-developed.  HENT:     Head: Normocephalic and atraumatic.  Eyes:     General:        Right eye: No discharge.        Left eye: No discharge.     Conjunctiva/sclera: Conjunctivae normal.  Neck:     Musculoskeletal: Neck supple.  Cardiovascular:     Rate and Rhythm: Normal rate and regular rhythm.     Heart sounds: Normal heart sounds. No murmur. No friction rub. No gallop.   Pulmonary:     Effort: Pulmonary effort is normal. No respiratory distress.     Breath sounds: Normal breath sounds.  Abdominal:     General: There is no distension.     Palpations: Abdomen is soft.     Tenderness: There is no abdominal tenderness.  Musculoskeletal:        General: No tenderness.  Skin:    General: Skin is warm and dry.     Comments: Skin  changes consistent with intertrigo underneath bilateral breast, perineal region and a small amount around the umbilicus.  Neurological:     Mental Status: She is alert.  Psychiatric:        Behavior: Behavior normal.        Thought Content: Thought content normal.      ED Treatments / Results  Labs (all labs ordered are listed, but only abnormal results are displayed) Labs Reviewed  BASIC METABOLIC PANEL - Abnormal; Notable for the following components:      Result Value   Sodium 133 (*)    CO2 20 (*)    Glucose, Bld 392 (*)    Creatinine, Ser 1.17 (*)    GFR calc non Af Amer 46 (*)    GFR calc Af Amer 53 (*)    All other components within normal limits  CBC - Abnormal; Notable for the following components:   Platelets 418 (*)    All other components within normal limits  URINALYSIS, ROUTINE W REFLEX MICROSCOPIC - Abnormal; Notable for the following components:   APPearance TURBID (*)    Glucose, UA >=500 (*)    Hgb urine dipstick SMALL (*)    Ketones, ur 5 (*)    Protein, ur 100 (*)    Leukocytes,Ua LARGE (*)    WBC, UA >50 (*)    Bacteria, UA FEW (*)    All other components within normal limits  CBG MONITORING, ED - Abnormal; Notable for the following components:   Glucose-Capillary 358 (*)    All other components within normal limits  URINE CULTURE  POC OCCULT BLOOD, ED  TYPE AND SCREEN  ABO/RH    EKG EKG Interpretation  Date/Time:  Tuesday September 14 2019 11:42:13 EDT Ventricular Rate:  89 PR Interval:    QRS Duration: 116 QT Interval:  369 QTC Calculation: 449 R Axis:   79 Text Interpretation:  Right and left arm electrode reversal, interpretation assumes no reversal Sinus rhythm Incomplete right bundle branch block Confirmed by Virgel Manifold [phone]) on 09/14/2019 12:58:14 PM   Radiology No results found.  Procedures Procedures (including critical care time)  Medications Ordered in ED Medications - No data to display   Initial Impression /  Assessment and Plan / ED Course  I have reviewed the triage vital signs and the nursing notes.  Pertinent labs & imaging results that were available during my care of the patient were reviewed by me and considered in my medical decision making (see chart  for details).        73 year old female with multiple complaints.  Suspect that some of her symptoms are related to UTI.  Black stool but Hemoccult negative, not anticoagulated and normal H/H.  Hyperglycemia may be exacerbated by underlying UTI.  She is started on antibiotics.  Treatment for intertrigo as well.   Final Clinical Impressions(s) / ED Diagnoses   Final diagnoses:  Acute cystitis without hematuria  Intertrigo  Near syncope  Hyperglycemia    ED Discharge Orders    None       Virgel Manifold, MD 09/15/19 [phone]

## 2019-09-16 LAB — URINE CULTURE: Culture: 40000 — AB

## 2019-09-17 ENCOUNTER — Telehealth: Payer: Self-pay

## 2019-09-17 NOTE — Telephone Encounter (Signed)
[facility] ED 09/14/2019 showing Yeast  D/c on Amoxicillin and Cephalexin  Ok by Michele Mcalpine D

## 2021-08-09 DIAGNOSIS — I639 Cerebral infarction, unspecified: Secondary | ICD-10-CM

## 2021-08-09 DIAGNOSIS — I214 Non-ST elevation (NSTEMI) myocardial infarction: Secondary | ICD-10-CM

## 2021-08-09 DIAGNOSIS — E1110 Type 2 diabetes mellitus with ketoacidosis without coma: Secondary | ICD-10-CM

## 2021-08-09 HISTORY — DX: Non-ST elevation (NSTEMI) myocardial infarction: I21.4

## 2021-08-09 HISTORY — DX: Cerebral infarction, unspecified: I63.9

## 2021-08-09 HISTORY — DX: Type 2 diabetes mellitus with ketoacidosis without coma: E11.10

## 2021-08-27 ENCOUNTER — Encounter (HOSPITAL_COMMUNITY): Payer: Self-pay | Admitting: Emergency Medicine

## 2021-08-27 ENCOUNTER — Emergency Department (HOSPITAL_COMMUNITY): Payer: Medicare Other

## 2021-08-27 ENCOUNTER — Inpatient Hospital Stay (HOSPITAL_COMMUNITY)
Admission: EM | Admit: 2021-08-27 | Discharge: 2021-09-05 | DRG: 064 | Disposition: A | Discharge status: Home Health | Payer: Medicare Other | Attending: Internal Medicine | Admitting: Internal Medicine

## 2021-08-27 ENCOUNTER — Other Ambulatory Visit: Payer: Self-pay

## 2021-08-27 DIAGNOSIS — I513 Intracardiac thrombosis, not elsewhere classified: Secondary | ICD-10-CM | POA: Diagnosis not present

## 2021-08-27 DIAGNOSIS — R778 Other specified abnormalities of plasma proteins: Secondary | ICD-10-CM | POA: Diagnosis not present

## 2021-08-27 DIAGNOSIS — D329 Benign neoplasm of meninges, unspecified: Secondary | ICD-10-CM | POA: Diagnosis present

## 2021-08-27 DIAGNOSIS — B9561 Methicillin susceptible Staphylococcus aureus infection as the cause of diseases classified elsewhere: Principal | ICD-10-CM

## 2021-08-27 DIAGNOSIS — I214 Non-ST elevation (NSTEMI) myocardial infarction: Secondary | ICD-10-CM | POA: Diagnosis not present

## 2021-08-27 DIAGNOSIS — N189 Chronic kidney disease, unspecified: Secondary | ICD-10-CM | POA: Diagnosis present

## 2021-08-27 DIAGNOSIS — E1122 Type 2 diabetes mellitus with diabetic chronic kidney disease: Secondary | ICD-10-CM | POA: Diagnosis not present

## 2021-08-27 DIAGNOSIS — Z20822 Contact with and (suspected) exposure to covid-19: Secondary | ICD-10-CM | POA: Diagnosis not present

## 2021-08-27 DIAGNOSIS — Z7984 Long term (current) use of oral hypoglycemic drugs: Secondary | ICD-10-CM

## 2021-08-27 DIAGNOSIS — E538 Deficiency of other specified B group vitamins: Secondary | ICD-10-CM | POA: Diagnosis present

## 2021-08-27 DIAGNOSIS — Z8249 Family history of ischemic heart disease and other diseases of the circulatory system: Secondary | ICD-10-CM

## 2021-08-27 DIAGNOSIS — I129 Hypertensive chronic kidney disease with stage 1 through stage 4 chronic kidney disease, or unspecified chronic kidney disease: Secondary | ICD-10-CM | POA: Diagnosis present

## 2021-08-27 DIAGNOSIS — N179 Acute kidney failure, unspecified: Secondary | ICD-10-CM | POA: Diagnosis present

## 2021-08-27 DIAGNOSIS — R509 Fever, unspecified: Secondary | ICD-10-CM

## 2021-08-27 DIAGNOSIS — I6523 Occlusion and stenosis of bilateral carotid arteries: Secondary | ICD-10-CM | POA: Diagnosis not present

## 2021-08-27 DIAGNOSIS — R531 Weakness: Secondary | ICD-10-CM | POA: Diagnosis present

## 2021-08-27 DIAGNOSIS — G9341 Metabolic encephalopathy: Secondary | ICD-10-CM | POA: Diagnosis present

## 2021-08-27 DIAGNOSIS — R471 Dysarthria and anarthria: Secondary | ICD-10-CM | POA: Diagnosis present

## 2021-08-27 DIAGNOSIS — N760 Acute vaginitis: Secondary | ICD-10-CM | POA: Diagnosis not present

## 2021-08-27 DIAGNOSIS — I6330 Cerebral infarction due to thrombosis of unspecified cerebral artery: Secondary | ICD-10-CM | POA: Diagnosis not present

## 2021-08-27 DIAGNOSIS — R29707 NIHSS score 7: Secondary | ICD-10-CM | POA: Diagnosis present

## 2021-08-27 DIAGNOSIS — Z96611 Presence of right artificial shoulder joint: Secondary | ICD-10-CM | POA: Diagnosis present

## 2021-08-27 DIAGNOSIS — E860 Dehydration: Secondary | ICD-10-CM | POA: Diagnosis present

## 2021-08-27 DIAGNOSIS — Z79899 Other long term (current) drug therapy: Secondary | ICD-10-CM

## 2021-08-27 DIAGNOSIS — Z794 Long term (current) use of insulin: Secondary | ICD-10-CM

## 2021-08-27 DIAGNOSIS — R7881 Bacteremia: Secondary | ICD-10-CM | POA: Diagnosis present

## 2021-08-27 DIAGNOSIS — I6319 Cerebral infarction due to embolism of other precerebral artery: Secondary | ICD-10-CM | POA: Diagnosis not present

## 2021-08-27 DIAGNOSIS — E1110 Type 2 diabetes mellitus with ketoacidosis without coma: Secondary | ICD-10-CM | POA: Diagnosis present

## 2021-08-27 DIAGNOSIS — R4181 Age-related cognitive decline: Secondary | ICD-10-CM

## 2021-08-27 DIAGNOSIS — F32A Depression, unspecified: Secondary | ICD-10-CM | POA: Diagnosis not present

## 2021-08-27 DIAGNOSIS — E0810 Diabetes mellitus due to underlying condition with ketoacidosis without coma: Secondary | ICD-10-CM | POA: Diagnosis not present

## 2021-08-27 DIAGNOSIS — R350 Frequency of micturition: Secondary | ICD-10-CM | POA: Diagnosis present

## 2021-08-27 DIAGNOSIS — E785 Hyperlipidemia, unspecified: Secondary | ICD-10-CM | POA: Diagnosis present

## 2021-08-27 DIAGNOSIS — B370 Candidal stomatitis: Secondary | ICD-10-CM

## 2021-08-27 DIAGNOSIS — Z9114 Patient's other noncompliance with medication regimen: Secondary | ICD-10-CM | POA: Diagnosis not present

## 2021-08-27 DIAGNOSIS — I6389 Other cerebral infarction: Secondary | ICD-10-CM | POA: Diagnosis not present

## 2021-08-27 DIAGNOSIS — M545 Low back pain, unspecified: Secondary | ICD-10-CM | POA: Diagnosis present

## 2021-08-27 DIAGNOSIS — E876 Hypokalemia: Secondary | ICD-10-CM | POA: Diagnosis not present

## 2021-08-27 LAB — I-STAT VENOUS BLOOD GAS, ED
Acid-base deficit: 17 mmol/L — ABNORMAL HIGH (ref 0.0–2.0)
Bicarbonate: 7.4 mmol/L — ABNORMAL LOW (ref 20.0–28.0)
Calcium, Ion: 1.04 mmol/L — ABNORMAL LOW (ref 1.15–1.40)
HCT: 41 % (ref 36.0–46.0)
Hemoglobin: 13.9 g/dL (ref 12.0–15.0)
O2 Saturation: 81 %
Potassium: 4.1 mmol/L (ref 3.5–5.1)
Sodium: 128 mmol/L — ABNORMAL LOW (ref 135–145)
TCO2: 8 mmol/L — ABNORMAL LOW (ref 22–32)
pCO2, Ven: 16.8 mmHg — CL (ref 44.0–60.0)
pH, Ven: 7.252 (ref 7.250–7.430)
pO2, Ven: 51 mmHg — ABNORMAL HIGH (ref 32.0–45.0)

## 2021-08-27 LAB — BASIC METABOLIC PANEL
Anion gap: 11 (ref 5–15)
BUN: 30 mg/dL — ABNORMAL HIGH (ref 8–23)
CO2: 18 mmol/L — ABNORMAL LOW (ref 22–32)
Calcium: 8.8 mg/dL — ABNORMAL LOW (ref 8.9–10.3)
Chloride: 104 mmol/L (ref 98–111)
Creatinine, Ser: 1.52 mg/dL — ABNORMAL HIGH (ref 0.44–1.00)
GFR, Estimated: 35 mL/min — ABNORMAL LOW (ref >60)
Glucose, Bld: 232 mg/dL — ABNORMAL HIGH (ref 70–99)
Potassium: 5.4 mmol/L — ABNORMAL HIGH (ref 3.5–5.1)
Sodium: 133 mmol/L — ABNORMAL LOW (ref 135–145)

## 2021-08-27 LAB — CBG MONITORING, ED
Glucose-Capillary: 586 mg/dL (ref 70–99)
Glucose-Capillary: 551 mg/dL (ref 70–99)
Glucose-Capillary: 518 mg/dL (ref 70–99)
Glucose-Capillary: 445 mg/dL — ABNORMAL HIGH (ref 70–99)
Glucose-Capillary: 381 mg/dL — ABNORMAL HIGH (ref 70–99)
Glucose-Capillary: 240 mg/dL — ABNORMAL HIGH (ref 70–99)
Glucose-Capillary: 297 mg/dL — ABNORMAL HIGH (ref 70–99)
Glucose-Capillary: 224 mg/dL — ABNORMAL HIGH (ref 70–99)
Glucose-Capillary: 191 mg/dL — ABNORMAL HIGH (ref 70–99)

## 2021-08-27 LAB — BLOOD GAS, VENOUS
Acid-base deficit: 16.3 mmol/L — ABNORMAL HIGH (ref 0.0–2.0)
Bicarbonate: 10.5 mmol/L — ABNORMAL LOW (ref 20.0–28.0)
Drawn by: 5122
FIO2: 21
O2 Saturation: 33.9 %
Patient temperature: 37
pCO2, Ven: 30.1 mmHg — ABNORMAL LOW (ref 44.0–60.0)
pH, Ven: 7.169 — CL (ref 7.250–7.430)
pO2, Ven: <31 mmHg — CL (ref 32.0–45.0)

## 2021-08-27 LAB — COMPREHENSIVE METABOLIC PANEL
ALT: 14 U/L (ref 0–44)
AST: 14 U/L — ABNORMAL LOW (ref 15–41)
Albumin: 3 g/dL — ABNORMAL LOW (ref 3.5–5.0)
Alkaline Phosphatase: 208 U/L — ABNORMAL HIGH (ref 38–126)
Anion gap: 27 — ABNORMAL HIGH (ref 5–15)
BUN: 38 mg/dL — ABNORMAL HIGH (ref 8–23)
CO2: 8 mmol/L — ABNORMAL LOW (ref 22–32)
Calcium: 9.5 mg/dL (ref 8.9–10.3)
Chloride: 94 mmol/L — ABNORMAL LOW (ref 98–111)
Creatinine, Ser: 2.38 mg/dL — ABNORMAL HIGH (ref 0.44–1.00)
GFR, Estimated: 20 mL/min — ABNORMAL LOW (ref >60)
Glucose, Bld: 599 mg/dL (ref 70–99)
Potassium: 4.1 mmol/L (ref 3.5–5.1)
Sodium: 129 mmol/L — ABNORMAL LOW (ref 135–145)
Total Bilirubin: 1.8 mg/dL — ABNORMAL HIGH (ref 0.3–1.2)
Total Protein: 8 g/dL (ref 6.5–8.1)

## 2021-08-27 LAB — CBC WITH DIFFERENTIAL/PLATELET
Abs Immature Granulocytes: 0.22 10*3/uL — ABNORMAL HIGH (ref 0.00–0.07)
Basophils Absolute: 0.1 10*3/uL (ref 0.0–0.1)
Basophils Relative: 1 %
Eosinophils Absolute: 0 10*3/uL (ref 0.0–0.5)
Eosinophils Relative: 0 %
HCT: 40 % (ref 36.0–46.0)
Hemoglobin: 12.5 g/dL (ref 12.0–15.0)
Immature Granulocytes: 1 %
Lymphocytes Relative: 10 %
Lymphs Abs: 1.6 10*3/uL (ref 0.7–4.0)
MCH: 27.8 pg (ref 26.0–34.0)
MCHC: 31.3 g/dL (ref 30.0–36.0)
MCV: 89.1 fL (ref 80.0–100.0)
Monocytes Absolute: 1.8 10*3/uL — ABNORMAL HIGH (ref 0.1–1.0)
Monocytes Relative: 11 %
Neutro Abs: 11.9 10*3/uL — ABNORMAL HIGH (ref 1.7–7.7)
Neutrophils Relative %: 77 %
Platelets: 406 10*3/uL — ABNORMAL HIGH (ref 150–400)
RBC: 4.49 MIL/uL (ref 3.87–5.11)
RDW: 13.7 % (ref 11.5–15.5)
WBC: 15.5 10*3/uL — ABNORMAL HIGH (ref 4.0–10.5)
nRBC: 0 % (ref 0.0–0.2)

## 2021-08-27 LAB — URINALYSIS, MICROSCOPIC (REFLEX)

## 2021-08-27 LAB — URINALYSIS, ROUTINE W REFLEX MICROSCOPIC
Glucose, UA: >=500 mg/dL — AB
Ketones, ur: >80 mg/dL — AB
Nitrite: NEGATIVE
Protein, ur: NEGATIVE mg/dL
Specific Gravity, Urine: 1.02 (ref 1.005–1.030)
pH: 6 (ref 5.0–8.0)

## 2021-08-27 LAB — RAPID URINE DRUG SCREEN, HOSP PERFORMED
Amphetamines: NOT DETECTED
Barbiturates: NOT DETECTED
Benzodiazepines: NOT DETECTED
Cocaine: NOT DETECTED
Opiates: NOT DETECTED
Tetrahydrocannabinol: NOT DETECTED

## 2021-08-27 LAB — RESP PANEL BY RT-PCR (FLU A&B, COVID) ARPGX2
Influenza A by PCR: NEGATIVE
Influenza B by PCR: NEGATIVE
SARS Coronavirus 2 by RT PCR: NEGATIVE

## 2021-08-27 LAB — OSMOLALITY: Osmolality: 331 mOsm/kg (ref 275–295)

## 2021-08-27 LAB — PROTIME-INR
INR: 1.2 (ref 0.8–1.2)
Prothrombin Time: 15.3 seconds — ABNORMAL HIGH (ref 11.4–15.2)

## 2021-08-27 IMAGING — CT CT HEAD W/O CM
5 of 8 series · 17 of 47 positions shown, 18 images · non-contrast
Comparison: None.

CLINICAL DATA: Altered mental status.

EXAM:
CT HEAD WITHOUT CONTRAST
TECHNIQUE: Contiguous axial images were obtained from the base of the skull
through the vertex without intravenous contrast.

[Series 2: head wo · axial · 0.45mm/px · z∈[-146,-92]mm · 2 of 33 slices shown, 3 images (1 of 2)]
[im 11/33  brain]
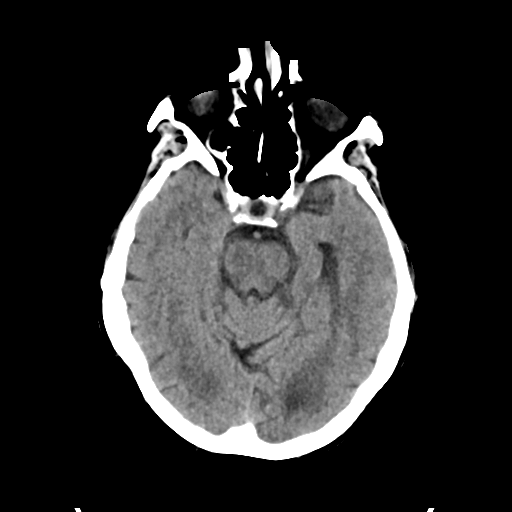
[im 11/33  bone]
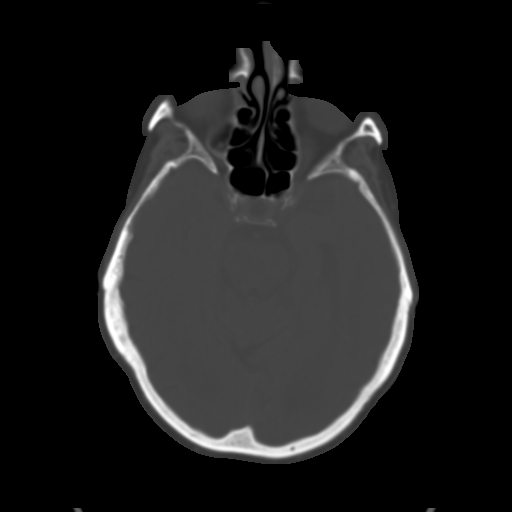
[im 22/33  brain]
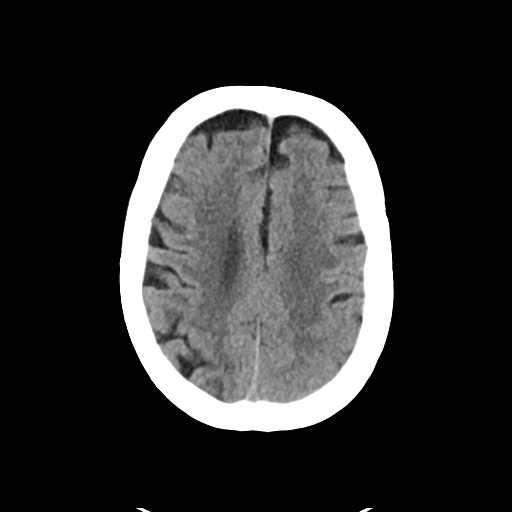

[Series 3: head wo · axial · 0.45mm/px · z∈[-146,-92]mm · 2 of 33 slices shown (2 of 2)]
[im 11/33  brain]
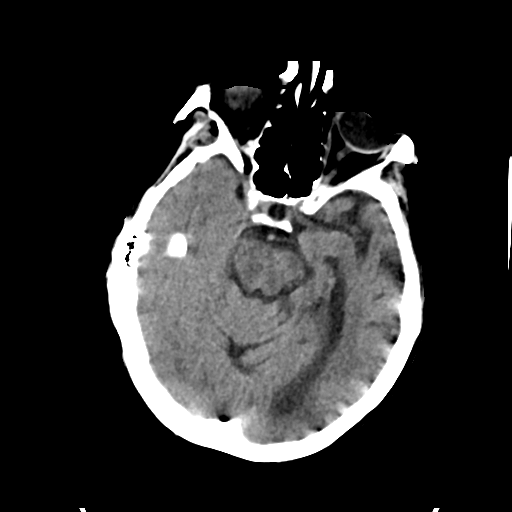
[im 22/33  brain]
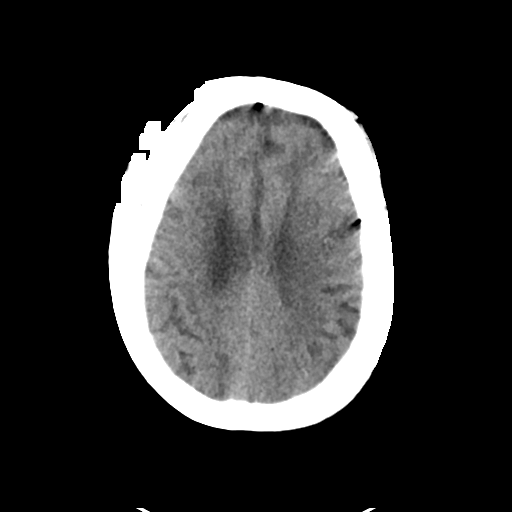

[Series 4: head bone · axial · 0.45mm/px · z∈[-180,-52]mm · 8 of 81 slices shown]
[im 9/81  bone]
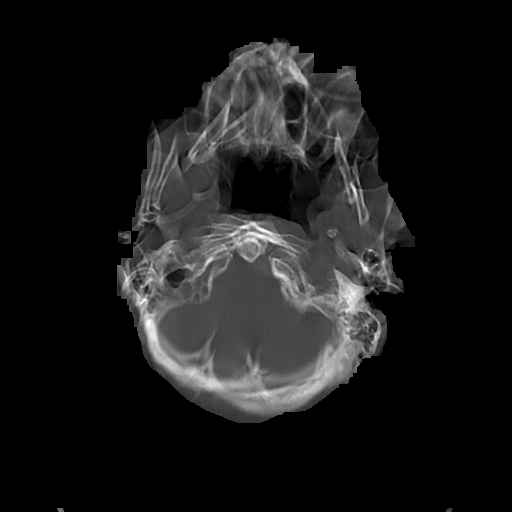
[im 17/81  bone]
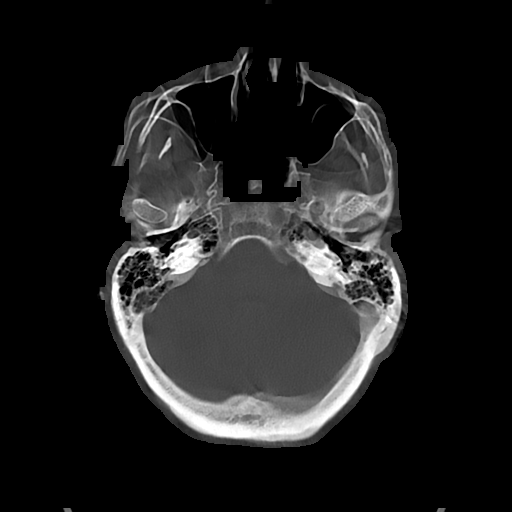
[im 25/81  bone]
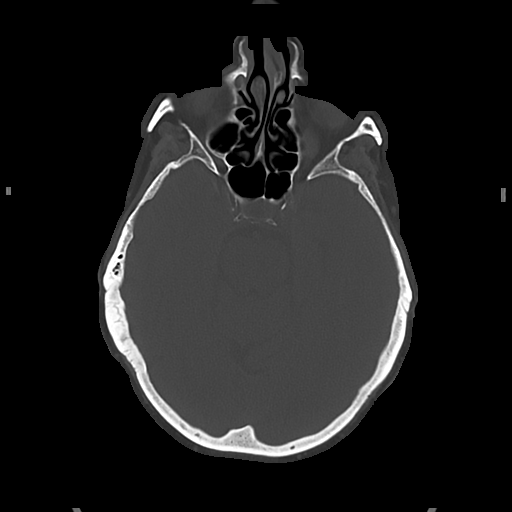
[im 33/81  bone]
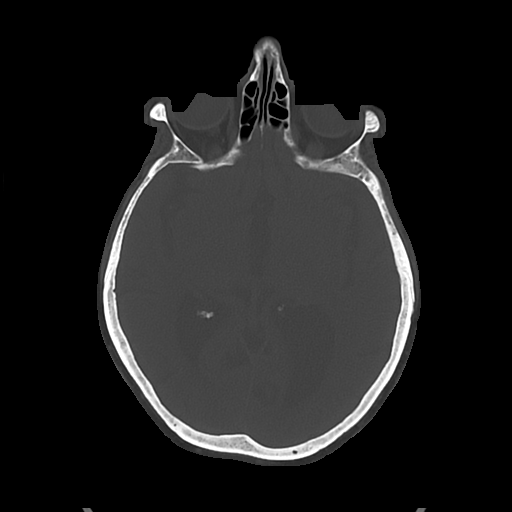
[im 49/81  bone]
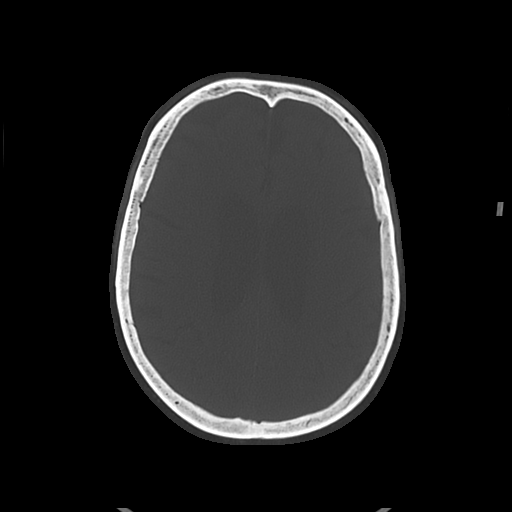
[im 57/81  bone]
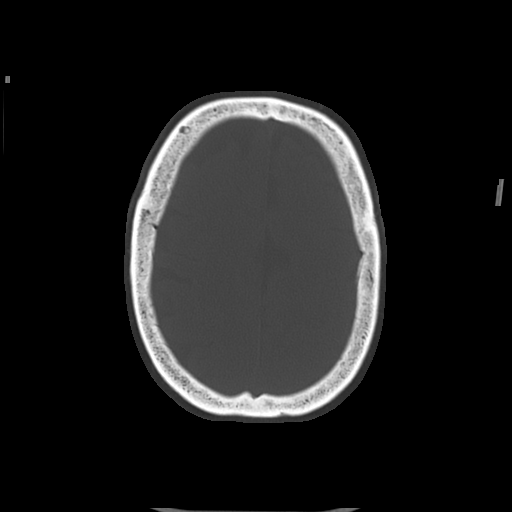
[im 65/81  bone]
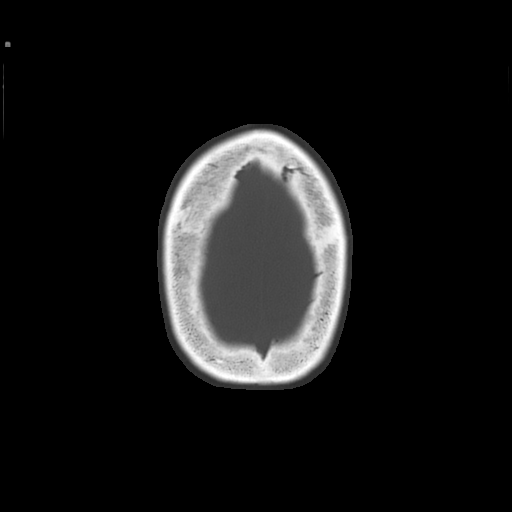
[im 73/81  bone]
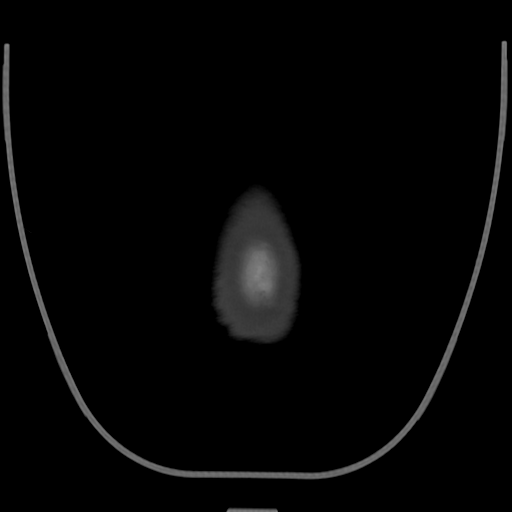

[Series 7: sag soft · sagittal · 0.38mm/px · 2 of 58 slices shown]
[im 20/58  brain]
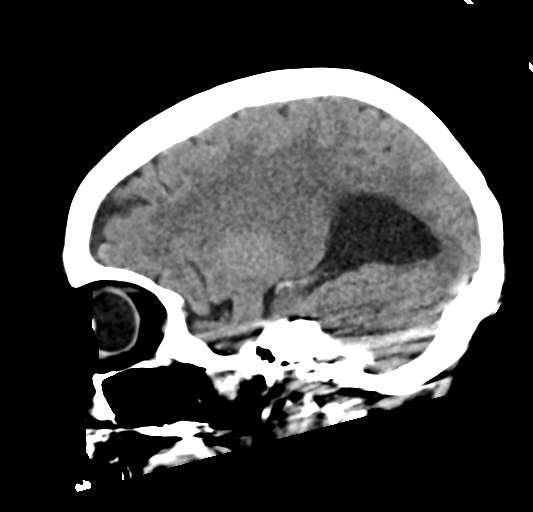
[im 39/58  brain]
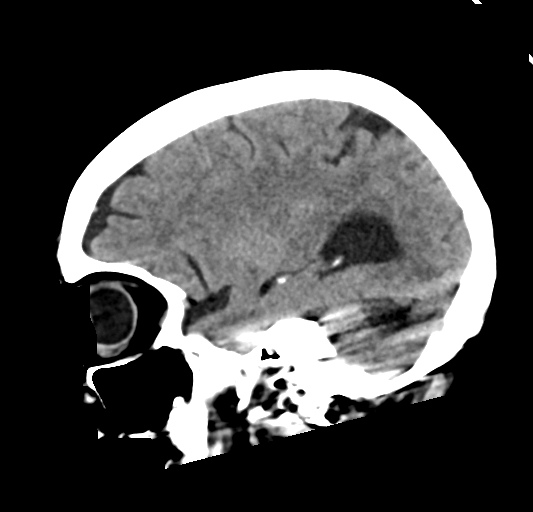

[Series 8: cor soft · coronal · 0.34mm/px · 3 of 81 slices shown]
[im 21/81  brain]
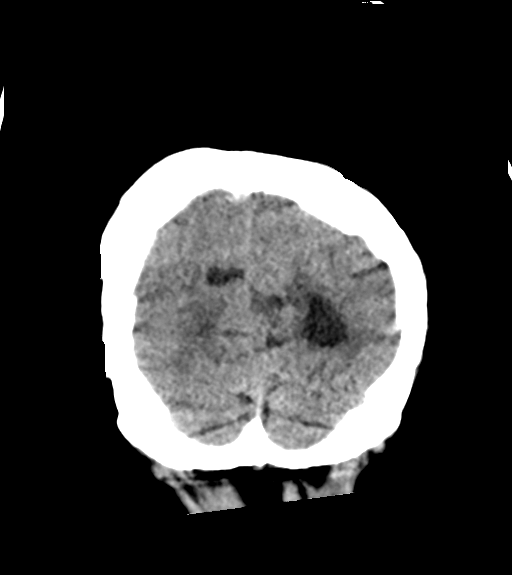
[im 41/81  brain]
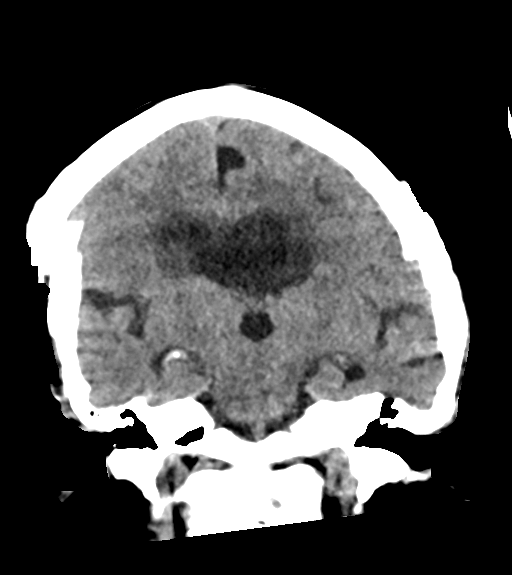
[im 61/81  brain]
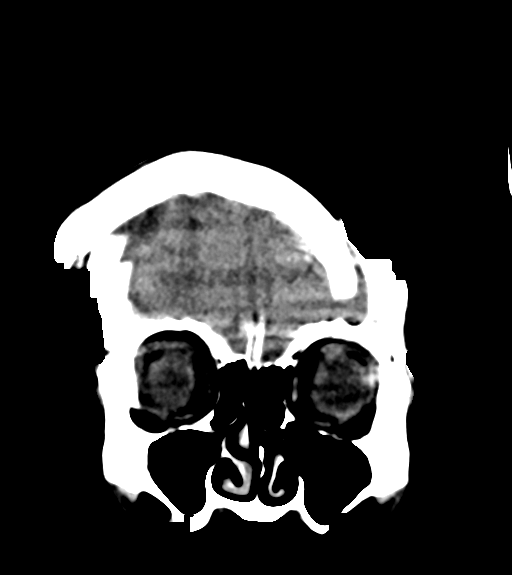

[17 of 47 positions shown; findings below may reference images not displayed]

FINDINGS: Brain: There is mild cerebral atrophy with widening of the
extra-axial spaces and ventricular dilatation.
There are areas of decreased attenuation within the white matter
tracts of the supratentorial brain, consistent with microvascular
disease changes.

A 2.2 cm x 1.6 cm well-defined, chronic appearing area of low
attenuation is seen within the cerebellum on the right. There is no
evidence of associated mass effect or midline shift.

A 1.2 cm x 0.7 cm x 1.2 cm partially calcified area of extra-axial
increased attenuation is seen within the right parietal region
(axial CT image 17, CT series 2).

Vascular: No hyperdense vessels are identified.

Skull: Normal. Negative for fracture or focal lesion.

Sinuses/Orbits: No acute finding.

Other: The study is limited secondary to patient motion.
IMPRESSION: 1. Area of extra-axial increased attenuation within the right
parietal region which may represent a partially calcified
meningioma. MRI correlation is recommended.
2. Chronic appearing right cerebellar infarct.
3. No acute intracranial abnormality.

## 2021-08-27 MED ORDER — SODIUM CHLORIDE 0.9 % IV BOLUS
1000.0000 mL | Freq: Once | INTRAVENOUS | Status: AC
  Administered 2021-08-27: 1000 mL via INTRAVENOUS

## 2021-08-27 MED ORDER — LACTATED RINGERS IV BOLUS
1000.0000 mL | Freq: Once | INTRAVENOUS | Status: AC
  Administered 2021-08-27: 1000 mL via INTRAVENOUS

## 2021-08-27 MED ORDER — DEXTROSE IN LACTATED RINGERS 5 % IV SOLN: INTRAVENOUS | Status: DC

## 2021-08-27 MED ORDER — INSULIN REGULAR(HUMAN) IN NACL 100-0.9 UT/100ML-% IV SOLN
INTRAVENOUS | Status: DC
  Administered 2021-08-27: 9 [IU]/h via INTRAVENOUS
  Filled 2021-08-27: qty 100

## 2021-08-27 MED ORDER — LACTATED RINGERS IV SOLN: INTRAVENOUS | Status: DC

## 2021-08-27 MED ORDER — DEXTROSE 50 % IV SOLN: 0.0000 mL | INTRAVENOUS | Status: DC | PRN

## 2021-08-27 MED ORDER — POTASSIUM CHLORIDE 10 MEQ/100ML IV SOLN
10.0000 meq | INTRAVENOUS | Status: AC
  Administered 2021-08-27: 10 meq via INTRAVENOUS
  Filled 2021-08-27: qty 100

## 2021-08-27 MED ORDER — ENOXAPARIN SODIUM 40 MG/0.4ML IJ SOSY
40.0000 mg | PREFILLED_SYRINGE | Freq: Every day | INTRAMUSCULAR | Status: DC
  Administered 2021-08-27 – 2021-09-04 (×9): 40 mg via SUBCUTANEOUS
  Filled 2021-08-27 (×9): qty 0.4

## 2021-08-27 MED ORDER — POTASSIUM CHLORIDE 10 MEQ/100ML IV SOLN
10.0000 meq | INTRAVENOUS | Status: AC
  Administered 2021-08-27 (×2): 10 meq via INTRAVENOUS
  Filled 2021-08-27 (×2): qty 100

## 2021-08-27 MED ORDER — INSULIN REGULAR(HUMAN) IN NACL 100-0.9 UT/100ML-% IV SOLN
INTRAVENOUS | Status: DC
  Filled 2021-08-27: qty 100

## 2021-08-27 NOTE — ED Provider Notes (Signed)
[facility] EMERGENCY DEPARTMENT Provider Note   CSN: [id number] Arrival date & time: 08/27/21  1300     History No chief complaint on file.   Sonya Duffy is a 75 y.o. female.  HPI Patient is a 75 year old female with a history of diabetes mellitus as well as hypertension who presents to the emergency department due to weakness.  Patient states that her cat ran away about 1 week ago.  She felt more depressed due to this and stopped taking her regular medications including her insulin.  She states that over the past few days she has become increasingly weak and reports associated nausea, vomiting, as well as diarrhea.  Her daughter is at bedside and states that she has become so weak that she is having difficulty getting out of the bathtub.  She also reports slurred speech for the past 2 days.  She states she has had no p.o. intake for 4 days.    Past Medical History:  Diagnosis Date   Diabetes mellitus    Hypertension     There are no problems to display for this patient.   Past Surgical History:  Procedure Laterality Date   CHOLECYSTECTOMY       OB History   No obstetric history on file.     No family history on file.  Social History   Tobacco Use   Smoking status: Never   Smokeless tobacco: Never  Substance Use Topics   Alcohol use: No   Drug use: No    Home Medications Prior to Admission medications   Medication Sig Start Date End Date Taking? Authorizing Provider  insulin glargine (LANTUS) 100 UNIT/ML injection Inject 20 Units into the skin at bedtime.   Yes [provider]  losartan (COZAAR) 50 MG tablet Take 50 mg by mouth daily. 08/17/19  Yes [provider]  metFORMIN (GLUCOPHAGE) 1000 MG tablet Take 1,000 mg by mouth 2 (two) times daily. 08/26/19  Yes [provider]  omeprazole (PRILOSEC) 40 MG capsule Take 40 mg by mouth daily. 07/15/19  Yes [provider]  cephALEXin (KEFLEX) 500 MG capsule Take  1 capsule (500 mg total) by mouth 3 (three) times daily. Patient not taking: No sig reported 09/14/19   Virgel Manifold, MD    Allergies    Patient has no known allergies.  Review of Systems   Review of Systems  All other systems reviewed and are negative. Ten systems reviewed and are negative for acute change, except as noted in the HPI.   Physical Exam Updated Vital Signs BP 127/79   Pulse (!) 101   Temp 98.5 F (36.9 C) (Oral)   Resp 20   Ht _0  (1.575 m)   Wt 69.9 kg   SpO2 100%   BMI 28.17 kg/m   Physical Exam Vitals and nursing note reviewed.  Constitutional:      General: She is not in acute distress.    Appearance: Normal appearance. She is not ill-appearing, toxic-appearing or diaphoretic.  HENT:     Head: Normocephalic and atraumatic.     Right Ear: External ear normal.     Left Ear: External ear normal.     Nose: Nose normal.     Mouth/Throat:     Mouth: Mucous membranes are dry.     Pharynx: Oropharynx is clear. No oropharyngeal exudate or posterior oropharyngeal erythema.  Eyes:     General: No scleral icterus.       Right eye: No  discharge.        Left eye: No discharge.     Extraocular Movements: Extraocular movements intact.     Conjunctiva/sclera: Conjunctivae normal.  Cardiovascular:     Rate and Rhythm: Regular rhythm. Tachycardia present.     Pulses: Normal pulses.     Heart sounds: Normal heart sounds. No murmur heard.   No friction rub. No gallop.  Pulmonary:     Effort: Pulmonary effort is normal. No respiratory distress.     Breath sounds: Normal breath sounds. No stridor. No wheezing, rhonchi or rales.  Abdominal:     General: Abdomen is flat.     Palpations: Abdomen is soft.     Tenderness: There is no abdominal tenderness.  Musculoskeletal:        General: Normal range of motion.     Cervical back: Normal range of motion and neck supple. No tenderness.  Skin:    General: Skin is warm and dry.  Neurological:     General: No focal  deficit present.     Mental Status: She is alert.     Comments: A&O x2.  Speaking clearly.  Answering questions appropriately when asked.  Moving all 4 extremities with ease.  Strength is 5/5 in all 4 extremities.  Psychiatric:        Mood and Affect: Mood normal.        Behavior: Behavior normal.    ED Results / Procedures / Treatments   Labs (all labs ordered are listed, but only abnormal results are displayed) Labs Reviewed  COMPREHENSIVE METABOLIC PANEL - Abnormal; Notable for the following components:      Result Value   Sodium 129 (*)    Chloride 94 (*)    CO2 8 (*)    Glucose, Bld 599 (*)    BUN 38 (*)    Creatinine, Ser 2.38 (*)    Albumin 3.0 (*)    AST 14 (*)    Alkaline Phosphatase 208 (*)    Total Bilirubin 1.8 (*)    GFR, Estimated 20 (*)    Anion gap 27 (*)    All other components within normal limits  OSMOLALITY - Abnormal; Notable for the following components:   Osmolality 331 (*)    All other components within normal limits  BLOOD GAS, VENOUS - Abnormal; Notable for the following components:   pH, Ven 7.169 (*)    pCO2, Ven 30.1 (*)    pO2, Ven <31.0 (*)    Bicarbonate 10.5 (*)    Acid-base deficit 16.3 (*)    All other components within normal limits  CBC WITH DIFFERENTIAL/PLATELET - Abnormal; Notable for the following components:   WBC 15.5 (*)    Platelets 406 (*)    Neutro Abs 11.9 (*)    Monocytes Absolute 1.8 (*)    Abs Immature Granulocytes 0.22 (*)    All other components within normal limits  PROTIME-INR - Abnormal; Notable for the following components:   Prothrombin Time 15.3 (*)    All other components within normal limits  CBG MONITORING, ED - Abnormal; Notable for the following components:   Glucose-Capillary 586 (*)    All other components within normal limits  I-STAT VENOUS BLOOD GAS, ED - Abnormal; Notable for the following components:   pCO2, Ven 16.8 (*)    pO2, Ven 51.0 (*)    Bicarbonate 7.4 (*)    TCO2 8 (*)    Acid-base  deficit 17.0 (*)    Sodium 128 (*)  Calcium, Ion 1.04 (*)    All other components within normal limits  RESP PANEL BY RT-PCR (FLU A&B, COVID) ARPGX2  URINALYSIS, ROUTINE W REFLEX MICROSCOPIC  BETA-HYDROXYBUTYRIC ACID  RAPID URINE DRUG SCREEN, HOSP PERFORMED  ETHANOL    EKG EKG Interpretation  Date/Time:  Monday August 27 2021 13:20:17 EDT Ventricular Rate:  113 PR Interval:  164 QRS Duration: 108 QT Interval:  334 QTC Calculation: 458 R Axis:   95 Text Interpretation: Sinus tachycardia with frequent Premature ventricular complexes Rightward axis Incomplete right bundle branch block artifact and PVCs< similar to previous but diffusely appears to have early repolarization more so than first previous Confirmed by Charlesetta Shanks [id number]) on 08/27/2021 2:46:12 PM  Radiology CT Head Wo Contrast  Result Date: 08/27/2021 CLINICAL DATA:  Altered mental status. EXAM: CT HEAD WITHOUT CONTRAST TECHNIQUE: Contiguous axial images were obtained from the base of the skull through the vertex without intravenous contrast. COMPARISON:  None. FINDINGS: Brain: There is mild cerebral atrophy with widening of the extra-axial spaces and ventricular dilatation. There are areas of decreased attenuation within the white matter tracts of the supratentorial brain, consistent with microvascular disease changes. A 2.2 cm x 1.6 cm well-defined, chronic appearing area of low attenuation is seen within the cerebellum on the right. There is no evidence of associated mass effect or midline shift. A 1.2 cm x 0.7 cm x 1.2 cm partially calcified area of extra-axial increased attenuation is seen within the right parietal region (axial CT image 17, CT series 2). Vascular: No hyperdense vessels are identified. Skull: Normal. Negative for fracture or focal lesion. Sinuses/Orbits: No acute finding. Other: The study is limited secondary to patient motion. IMPRESSION: 1. Area of extra-axial increased attenuation within the right  parietal region which may represent a partially calcified meningioma. MRI correlation is recommended. 2. Chronic appearing right cerebellar infarct. 3. No acute intracranial abnormality. Electronically Signed   By: Virgina Norfolk M.D.   On: 08/27/2021 15:28    Procedures .Critical Care Performed by: Rayna Sexton, PA-C Authorized by: Rayna Sexton, PA-C   Critical care provider statement:    Critical care time (minutes):  45   Critical care was necessary to treat or prevent imminent or life-threatening deterioration of the following conditions:  Endocrine crisis   Critical care was time spent personally by me on the following activities:  Evaluation of patient's response to treatment, examination of patient, ordering and performing treatments and interventions, ordering and review of laboratory studies, ordering and review of radiographic studies, pulse oximetry, re-evaluation of patient's condition, obtaining history from patient or surrogate and review of old charts   Medications Ordered in ED Medications  insulin regular, human (MYXREDLIN) 100 units/ 100 mL infusion (9 Units/hr Intravenous New Bag/Given 08/27/21 1602)  lactated ringers infusion (has no administration in time range)  dextrose 5 % in lactated ringers infusion (has no administration in time range)  dextrose 50 % solution 0-50 mL (has no administration in time range)  potassium chloride 10 mEq in 100 mL IVPB (has no administration in time range)  lactated ringers bolus 1,000 mL (has no administration in time range)  sodium chloride 0.9 % bolus 1,000 mL (0 mLs Intravenous Stopped 08/27/21 1558)   ED Course  I have reviewed the triage vital signs and the nursing notes.  Pertinent labs & imaging results that were available during my care of the patient were reviewed by me and considered in my medical decision making (see chart for details).    MDM  Rules/Calculators/A&P                          Pt is a 75 y.o. female  who presents to the emergency department with what appears to be diabetic ketoacidosis due to insulin noncompliance.  Labs: CBC with a white blood cell count of 15.5, platelets of 106, neutrophils of 11.9, monocytes of 1.8, absolute immature granulocytes of 0.22. Prothrombin time of 15.3 with an INR of 1.2. CMP with a sodium of 129, chloride of 94, CO2 of 8, glucose of 599, BUN of 38, creatinine of 2.38, albumin of 3, AST of 14, alk phos of 208, total bilirubin of 1.8, GFR 20, anion gap of 27. VBG with a pH of 7.169. Beta hydroxybutyric acid, ethanol, respiratory panel, and UA are pending.  Imaging: CT scan of the head without contrast with findings as noted above.  I, Rayna Sexton, PA-C, personally reviewed and evaluated these images and lab results as part of my medical decision-making.  Patient with lab findings as noted above.  Concerning for DKA.  Patient states that she was more depressed than normal due to her cat running away so she stopped taking her insulin about 6 days ago.  Denies any SI or thoughts of hurting herself.  Her daughter is concerned because she has become increasingly weak and also notes slurred speech.  Mild dysarthria on my exam but she does appear to have significant xerostomia which which might be the source of her sx.  Strength is 5/5 in all 4 extremities.  No gross deficits noted.  No facial droop.  CT scan of the head without acute intracranial abnormalities.  CMP concerning for additional AKI.  Likely prerenal.  Patient started on IV fluids, IV potassium, as well as an insulin drip.  Patient will require admission for further management.  We will discuss with the medicine team.  Note: Portions of this report may have been transcribed using voice recognition software. Every effort was made to ensure accuracy; however, inadvertent computerized transcription errors may be present.   Final Clinical Impression(s) / ED Diagnoses Final diagnoses:  Diabetic  ketoacidosis without coma associated with type 2 diabetes mellitus ([place])  AKI (acute kidney injury) [facility])   Rx / [place] Orders ED Discharge Orders     None        Rayna Sexton, PA-C 08/27/21 [place], Samuel A, DO 08/28/21 0012

## 2021-08-27 NOTE — H&P (Signed)
TRH H&P    Patient Demographics:    Sonya Duffy, is a 75 y.o. female  MRN: [id number]  DOB - 1946-08-20  Admit Date - 08/27/2021  Referring MD/NP/PA: Rolla Plate   Outpatient Primary MD for the patient is Pcp, No  Patient coming from: Home  Chief complaint-high blood sugar   HPI:    Sonya Duffy  is a 75 y.o. female, with medical history of diabetes mellitus type 2, hypertension who presented to the ED with complaints of weakness.  As per patient daughter patient's cat and UA a week ago and has been depressed since then.  She stopped taking her regular medication including insulin for cost few days.  Also has been having intermittent nausea and vomiting along with diarrhea.  Patient daughter says that patient became so weak that she was having difficulty getting out of bathtub.  Also developed slurred speech for the past 2 days.  She denies any focal neurological weakness of upper or lower extremities.  No numbness or tingling.  In the ED, patient was found to be in DKA.  CO2 8, anion gap 27 Patient started on IV fluids with LR and IV insulin infusion.  Serum osmolality 331.  CT head was done due to slurred speech which showed no acute stroke, showed area of extra-axial increased attenuation within the right parietal region which may represent a partially calcified meningioma.  Chronic appearing right cerebellar infarct.  There is no history of chest pain, no shortness of breath.     Review of systems:    In addition to the HPI above,    All other systems reviewed and are negative.    Past History of the following :    Past Medical History:  Diagnosis Date   Diabetes mellitus    Hypertension       Past Surgical History:  Procedure Laterality Date   CHOLECYSTECTOMY        Social History:      Social History   Tobacco Use   Smoking status: Never   Smokeless tobacco: Never  Substance  Use Topics   Alcohol use: No       Family History :   Patient's mother had history of tumor in the eye   Home Medications:   Prior to Admission medications   Medication Sig Start Date End Date Taking? Authorizing Provider  insulin glargine (LANTUS) 100 UNIT/ML injection Inject 20 Units into the skin at bedtime.   Yes [provider]  losartan (COZAAR) 50 MG tablet Take 50 mg by mouth daily. 08/17/19  Yes [provider]  metFORMIN (GLUCOPHAGE) 1000 MG tablet Take 1,000 mg by mouth 2 (two) times daily. 08/26/19  Yes [provider]  omeprazole (PRILOSEC) 40 MG capsule Take 40 mg by mouth daily. 07/15/19  Yes [provider]  cephALEXin (KEFLEX) 500 MG capsule Take 1 capsule (500 mg total) by mouth 3 (three) times daily. Patient not taking: No sig reported 09/14/19   Virgel Manifold, MD     Allergies:    No Known Allergies  Physical Exam:   Vitals  Blood pressure 105/67, pulse 93, temperature 98.5 F (36.9 C), temperature source Oral, resp. rate (!) 21, height 5\' 2"  (1.575 m), weight 69.9 kg, SpO2 100 %.  1.  General: Appears in no acute distress  2. Psychiatric: Alert, oriented x3, intact insight and judgment  3. Neurologic: Cranial nerves II through XII grossly intact, no focal deficit noted, motor strength 5/5 in all extremities, sensations intact bilaterally  4. HEENMT:  Atraumatic normocephalic, extraocular muscles are intact  5. Respiratory : Clear to auscultation bilaterally, no wheezing or crackles auscultated  6. Cardiovascular : S1-S2, regular, no murmur auscultated  7. Gastrointestinal:  Abdomen soft, nontender, no organomegaly  8. Skin:  No rashes noted     Data Review:    CBC Recent Labs  Lab 08/27/21 1313 08/27/21 1336  WBC 15.5*  --   HGB 12.5 13.9  HCT 40.0 41.0  PLT 406*  --   MCV 89.1  --   MCH 27.8  --   MCHC 31.3  --   RDW 13.7  --   LYMPHSABS 1.6  --   MONOABS 1.8*  --   EOSABS 0.0  --    BASOSABS 0.1  --    ------------------------------------------------------------------------------------------------------------------  Results for orders placed or performed during the hospital encounter of 08/27/21 (from the past 48 hour(s))  CBG monitoring, ED     Status: Abnormal   Collection Time: 08/27/21  1:01 PM  Result Value Ref Range   Glucose-Capillary 586 (HH) 70 - 99 mg/dL    Comment: Glucose reference range applies only to samples taken after fasting for at least 8 hours.   Comment 1 Notify RN   Comprehensive metabolic panel     Status: Abnormal   Collection Time: 08/27/21  1:13 PM  Result Value Ref Range   Sodium 129 (L) 135 - 145 mmol/L   Potassium 4.1 3.5 - 5.1 mmol/L   Chloride 94 (L) 98 - 111 mmol/L   CO2 8 (L) 22 - 32 mmol/L   Glucose, Bld 599 (HH) 70 - 99 mg/dL    Comment: Glucose reference range applies only to samples taken after fasting for at least 8 hours. CRITICAL RESULT CALLED TO, READ BACK BY AND VERIFIED WITH: Martinique NICKERSON, RN [phone] 08/27/21 L. KLAR    BUN 38 (H) 8 - 23 mg/dL   Creatinine, Ser 2.38 (H) 0.44 - 1.00 mg/dL   Calcium 9.5 8.9 - 10.3 mg/dL   Total Protein 8.0 6.5 - 8.1 g/dL   Albumin 3.0 (L) 3.5 - 5.0 g/dL   AST 14 (L) 15 - 41 U/L   ALT 14 0 - 44 U/L   Alkaline Phosphatase 208 (H) 38 - 126 U/L   Total Bilirubin 1.8 (H) 0.3 - 1.2 mg/dL   GFR, Estimated 20 (L) >60 mL/min    Comment: (NOTE) Calculated using the CKD-EPI Creatinine Equation (2021)    Anion gap 27 (H) 5 - 15    Comment: REPEATED TO VERIFY Performed at [facility], [address] [place], [place] [address]   Osmolality     Status: Abnormal   Collection Time: 08/27/21  1:13 PM  Result Value Ref Range   Osmolality 331 (HH) 275 - 295 mOsm/kg    Comment: REPEATED TO VERIFY CRITICAL RESULT CALLED TO, READ BACK BY AND VERIFIED WITH: J. NICKERSON,RN@1449  08/27/21 T.LOVE Performed at [facility], [place] [address], [place], [place] [address]   CBC with  Differential  Status: Abnormal   Collection Time: 08/27/21  1:13 PM  Result Value Ref Range   WBC 15.5 (H) 4.0 - 10.5 K/uL   RBC 4.49 3.87 - 5.11 MIL/uL   Hemoglobin 12.5 12.0 - 15.0 g/dL   HCT 40.0 36.0 - 46.0 %   MCV 89.1 80.0 - 100.0 fL   MCH 27.8 26.0 - 34.0 pg   MCHC 31.3 30.0 - 36.0 g/dL   RDW 13.7 11.5 - 15.5 %   Platelets 406 (H) 150 - 400 K/uL   nRBC 0.0 0.0 - 0.2 %   Neutrophils Relative % 77 %   Neutro Abs 11.9 (H) 1.7 - 7.7 K/uL   Lymphocytes Relative 10 %   Lymphs Abs 1.6 0.7 - 4.0 K/uL   Monocytes Relative 11 %   Monocytes Absolute 1.8 (H) 0.1 - 1.0 K/uL   Eosinophils Relative 0 %   Eosinophils Absolute 0.0 0.0 - 0.5 K/uL   Basophils Relative 1 %   Basophils Absolute 0.1 0.0 - 0.1 K/uL   Immature Granulocytes 1 %   Abs Immature Granulocytes 0.22 (H) 0.00 - 0.07 K/uL    Comment: Performed at [facility], [address] [place], [place] [address]  Protime-INR     Status: Abnormal   Collection Time: 08/27/21  1:13 PM  Result Value Ref Range   Prothrombin Time 15.3 (H) 11.4 - 15.2 seconds   INR 1.2 0.8 - 1.2    Comment: (NOTE) INR goal varies based on device and disease states. Performed at [facility], [place] [address] [place], [place] [address]   Blood gas, venous     Status: Abnormal   Collection Time: 08/27/21  1:27 PM  Result Value Ref Range   FIO2 21.00    pH, Ven 7.169 (LL) 7.250 - 7.430    Comment: CRITICAL RESULT CALLED TO, READ BACK BY AND VERIFIED WITH: M.COCHRAN RN 1351 08/27/21 MCCORMICK K    pCO2, Ven 30.1 (L) 44.0 - 60.0 mmHg   pO2, Ven <31.0 (LL) 32.0 - 45.0 mmHg    Comment: CRITICAL RESULT CALLED TO, READ BACK BY AND VERIFIED WITH: M.COCHRAN RN 1351 08/27/21 MCCORMICK K    Bicarbonate 10.5 (L) 20.0 - 28.0 mmol/L   Acid-base deficit 16.3 (H) 0.0 - 2.0 mmol/L   O2 Saturation 33.9 %   Patient temperature 37.0    Drawn by [id number]    Sample type VENOUS     Comment: Performed at [facility], [place] [address]  [place], [place] [address]  I-Stat venous blood gas, ED     Status: Abnormal   Collection Time: 08/27/21  1:36 PM  Result Value Ref Range   pH, Ven 7.252 7.250 - 7.430   pCO2, Ven 16.8 (LL) 44.0 - 60.0 mmHg   pO2, Ven 51.0 (H) 32.0 - 45.0 mmHg   Bicarbonate 7.4 (L) 20.0 - 28.0 mmol/L   TCO2 8 (L) 22 - 32 mmol/L   O2 Saturation 81.0 %   Acid-base deficit 17.0 (H) 0.0 - 2.0 mmol/L   Sodium 128 (L) 135 - 145 mmol/L   Potassium 4.1 3.5 - 5.1 mmol/L   Calcium, Ion 1.04 (L) 1.15 - 1.40 mmol/L   HCT 41.0 36.0 - 46.0 %   Hemoglobin 13.9 12.0 - 15.0 g/dL   Sample type VENOUS    Comment NOTIFIED PHYSICIAN   Resp Panel by RT-PCR (Flu A&B, Covid) Nasopharyngeal Swab     Status: None   Collection Time: 08/27/21  3:43  PM   Specimen: Nasopharyngeal Swab; Nasopharyngeal(NP) swabs in vial transport medium  Result Value Ref Range   SARS Coronavirus 2 by RT PCR NEGATIVE NEGATIVE    Comment: (NOTE) SARS-CoV-2 target nucleic acids are NOT DETECTED.  The SARS-CoV-2 RNA is generally detectable in upper respiratory specimens during the acute phase of infection. The lowest concentration of SARS-CoV-2 viral copies this assay can detect is 138 copies/mL. A negative result does not preclude SARS-Cov-2 infection and should not be used as the sole basis for treatment or other patient management decisions. A negative result may occur with  improper specimen collection/handling, submission of specimen other than nasopharyngeal swab, presence of viral mutation(s) within the areas targeted by this assay, and inadequate number of viral copies(<138 copies/mL). A negative result must be combined with clinical observations, patient history, and epidemiological information. The expected result is Negative.  Fact Sheet for Patients:  EntrepreneurPulse.com.au  Fact Sheet for Healthcare Providers:  IncredibleEmployment.be  This test is no t yet approved or cleared by the [place]  FDA and  has been authorized for detection and/or diagnosis of SARS-CoV-2 by FDA under an Emergency Use Authorization (EUA). This EUA will remain  in effect (meaning this test can be used) for the duration of the COVID-19 declaration under Section 564(b)(1) of the Act, 21 U.S.C.section 360bbb-3(b)(1), unless the authorization is terminated  or revoked sooner.       Influenza A by PCR NEGATIVE NEGATIVE   Influenza B by PCR NEGATIVE NEGATIVE    Comment: (NOTE) The Xpert Xpress SARS-CoV-2/FLU/RSV plus assay is intended as an aid in the diagnosis of influenza from Nasopharyngeal swab specimens and should not be used as a sole basis for treatment. Nasal washings and aspirates are unacceptable for Xpert Xpress SARS-CoV-2/FLU/RSV testing.  Fact Sheet for Patients: EntrepreneurPulse.com.au  Fact Sheet for Healthcare Providers: IncredibleEmployment.be  This test is not yet approved or cleared by the [place] FDA and has been authorized for detection and/or diagnosis of SARS-CoV-2 by FDA under an Emergency Use Authorization (EUA). This EUA will remain in effect (meaning this test can be used) for the duration of the COVID-19 declaration under Section 564(b)(1) of the Act, 21 U.S.C. section 360bbb-3(b)(1), unless the authorization is terminated or revoked.  Performed at [facility], [place] [address] [place], [place] [address]   CBG monitoring, ED     Status: Abnormal   Collection Time: 08/27/21  3:59 PM  Result Value Ref Range   Glucose-Capillary 551 (HH) 70 - 99 mg/dL    Comment: Glucose reference range applies only to samples taken after fasting for at least 8 hours.  CBG monitoring, ED     Status: Abnormal   Collection Time: 08/27/21  4:58 PM  Result Value Ref Range   Glucose-Capillary 518 (HH) 70 - 99 mg/dL    Comment: Glucose reference range applies only to samples taken after fasting for at least 8 hours.   Comment 1 Document in  Chart   CBG monitoring, ED     Status: Abnormal   Collection Time: 08/27/21  5:49 PM  Result Value Ref Range   Glucose-Capillary 445 (H) 70 - 99 mg/dL    Comment: Glucose reference range applies only to samples taken after fasting for at least 8 hours.    Chemistries  Recent Labs  Lab 08/27/21 1313 08/27/21 1336  NA 129* 128*  K 4.1 4.1  CL 94*  --   CO2 8*  --   GLUCOSE 599*  --  BUN 38*  --   CREATININE 2.38*  --   CALCIUM 9.5  --   AST 14*  --   ALT 14  --   ALKPHOS 208*  --   BILITOT 1.8*  --    ------------------------------------------------------------------------------------------------------------------  ------------------------------------------------------------------------------------------------------------------ GFR: Estimated Creatinine Clearance: 18.1 mL/min (A) (by C-G formula based on SCr of 2.38 mg/dL (H)). Liver Function Tests: Recent Labs  Lab 08/27/21 1313  AST 14*  ALT 14  ALKPHOS 208*  BILITOT 1.8*  PROT 8.0  ALBUMIN 3.0*   No results for input(s): LIPASE, AMYLASE in the last 168 hours. No results for input(s): AMMONIA in the last 168 hours. Coagulation Profile: Recent Labs  Lab 08/27/21 1313  INR 1.2   Cardiac Enzymes: No results for input(s): CKTOTAL, CKMB, CKMBINDEX, TROPONINI in the last 168 hours. BNP (last 3 results) No results for input(s): PROBNP in the last 8760 hours. HbA1C: No results for input(s): HGBA1C in the last 72 hours. CBG: Recent Labs  Lab 08/27/21 1301 08/27/21 1559 08/27/21 1658 08/27/21 1749  GLUCAP 586* 551* 518* 445*   Lipid Profile: No results for input(s): CHOL, HDL, LDLCALC, TRIG, CHOLHDL, LDLDIRECT in the last 72 hours. Thyroid Function Tests: No results for input(s): TSH, T4TOTAL, FREET4, T3FREE, THYROIDAB in the last 72 hours. Anemia Panel: No results for input(s): VITAMINB12, FOLATE, FERRITIN, TIBC, IRON, RETICCTPCT in the last 72  hours.  --------------------------------------------------------------------------------------------------------------- Urine analysis:    Component Value Date/Time   COLORURINE YELLOW 09/14/2019 1037   APPEARANCEUR TURBID (A) 09/14/2019 1037   LABSPEC 1.022 09/14/2019 1037   PHURINE 5.0 09/14/2019 1037   GLUCOSEU >=500 (A) 09/14/2019 1037   HGBUR SMALL (A) 09/14/2019 1037   BILIRUBINUR NEGATIVE 09/14/2019 1037   KETONESUR 5 (A) 09/14/2019 1037   PROTEINUR 100 (A) 09/14/2019 1037   UROBILINOGEN 0.2 10/15/2012 2343   NITRITE NEGATIVE 09/14/2019 1037   LEUKOCYTESUR LARGE (A) 09/14/2019 1037      Imaging Results:    CT Head Wo Contrast  Result Date: 08/27/2021 CLINICAL DATA:  Altered mental status. EXAM: CT HEAD WITHOUT CONTRAST TECHNIQUE: Contiguous axial images were obtained from the base of the skull through the vertex without intravenous contrast. COMPARISON:  None. FINDINGS: Brain: There is mild cerebral atrophy with widening of the extra-axial spaces and ventricular dilatation. There are areas of decreased attenuation within the white matter tracts of the supratentorial brain, consistent with microvascular disease changes. A 2.2 cm x 1.6 cm well-defined, chronic appearing area of low attenuation is seen within the cerebellum on the right. There is no evidence of associated mass effect or midline shift. A 1.2 cm x 0.7 cm x 1.2 cm partially calcified area of extra-axial increased attenuation is seen within the right parietal region (axial CT image 17, CT series 2). Vascular: No hyperdense vessels are identified. Skull: Normal. Negative for fracture or focal lesion. Sinuses/Orbits: No acute finding. Other: The study is limited secondary to patient motion. IMPRESSION: 1. Area of extra-axial increased attenuation within the right parietal region which may represent a partially calcified meningioma. MRI correlation is recommended. 2. Chronic appearing right cerebellar infarct. 3. No acute  intracranial abnormality. Electronically Signed   By: Virgina Norfolk M.D.   On: 08/27/2021 15:28    My personal review of EKG: Rhythm NSR, PVCs noted   Assessment & Plan:    Active Problems:   DKA (diabetic ketoacidosis) ([place])   Diabetic ketoacidosis-patient presented with diabetic ketoacidosis, anion gap 27, bicarb 8.  Will initiate DKA protocol, IV insulin, IV  LR at 125 mill per hour.  Check BMP every 4 hours per protocol.  Replace potassium.  Once CBG is less than 250, will switch IV fluids to D5 half-normal saline.  We will keep patient n.p.o. for now. Slurred speech-likely from extreme dehydration due to DKA, serum osmolality is 331.  CT head was negative for acute stroke however it showed chronic appearing right cerebellar infarct.  Also showed partially calcified meningioma.  Discussed with neurology, patient might need MRI brain with contrast, however due to acute kidney injury will not be advisable to get gadolinium contrast as GFR is less than 30.  We will follow neurology recommendations. Acute kidney injury-patient's BUNs/creatinine today is 38/2.38, her baseline creatinine was 1.17 as of 09/14/2019.  Likely from dehydration, poor p.o. intake and DKA.  Started on IV fluids as above.  We will follow BMP in am. Hypertension-blood pressure is soft, will hold Cozaar at this time.    DVT Prophylaxis-   Lovenox   AM Labs Ordered, also please review Full Orders  Family Communication: Admission, patients condition and plan of care including tests being ordered have been discussed with the patient and her daughter at bedside who indicate understanding and agree with the plan and Code Status.  Code Status: Full code  Admission status: Inpatient :The appropriate admission status for this patient is INPATIENT. Inpatient status is judged to be reasonable and necessary in order to provide the required intensity of service to ensure the patient's safety. The patient's presenting symptoms,  physical exam findings, and initial radiographic and laboratory data in the context of their chronic comorbidities is felt to place them at high risk for further clinical deterioration. Furthermore, it is not anticipated that the patient will be medically stable for discharge from the hospital within 2 midnights of admission. The following factors support the admission status of inpatient.     The patient's presenting symptoms include hyperglycemia The worrisome physical exam findings include slurred speech. The initial radiographic and laboratory data are worrisome because of diabetic ketoacidosis. The chronic co-morbidities include diabetes mellitus type 2.       * I certify that at the point of admission it is my clinical judgment that the patient will require inpatient hospital care spanning beyond 2 midnights from the point of admission due to high intensity of service, high risk for further deterioration and high frequency of surveillance required.*  Time spent in minutes : 60 minutes   Sierra Bissonette S Taylie Helder M.D

## 2021-08-27 NOTE — ED Triage Notes (Signed)
Pt here from home with slurred speech for 4 days , no insulin for approx 2 weeks , cbg read high

## 2021-08-27 NOTE — Consult Note (Addendum)
Neurology Consultation Reason for Consult: Slurred speech Requesting Physician: Eleonore Chiquito  CC: Slurred speech and generalized weakness  History is obtained from: Patient, daughter at bedside and chart review  HPI: Sonya Duffy is a 75 y.o. female with past medical history significant for insulin-dependent diabetes and hypertension, intellectual delay/illiteracy.  The patient and her daughter notes that the patient's cat has been staying outside of the house recently and refuses to come back inside the house which led the patient to be sad and stopped taking her insulin.  In that setting she began to have intermittent nausea and vomiting as well as diarrhea, generalized weakness, slurred speech that was progressive.  Daughter also notes a progressive head tremor over the past few years and some back pain secondary to falls.  At baseline, [place], her daughter has been managing the finances since she was aged 17 and the patient has never driven.  Despite that she manages her medications as well as her son's medications.  Daughter is concerned about some cognitive decline over the past 1 year that has been gradually progressive and subtle.  Denies any concern for seizure-like activity.  No bowel or bladder issues with incontinence or constipation  LKW: 2 to 3 days prior to admission tPA given?: No, out of the window IA performed?: No, or if yes, groin puncture time:  Premorbid modified rankin scale:      1 - No significant disability. Able to carry out all usual activities, despite some symptoms.   ROS: Unable to obtain reliably due to altered mental status.  Pertinent as above in HPI is obtained from family  Past Medical History:  Diagnosis Date   Diabetes mellitus    Hypertension    Past Surgical History:  Procedure Laterality Date   CHOLECYSTECTOMY     Current Outpatient Medications  Medication Instructions    cephALEXin (KEFLEX) 500 mg, Oral, 3 times daily   insulin glargine (LANTUS) 20 Units, Subcutaneous, Daily at bedtime   losartan (COZAAR) 50 mg, Oral, Daily   metFORMIN (GLUCOPHAGE) 1,000 mg, Oral, 2 times daily   omeprazole (PRILOSEC) 40 mg, Oral, Daily   No family history on file. There is some family history of intellectual delay  Social History:  reports that she has never smoked. She has never used smokeless tobacco. She reports that she does not drink alcohol and does not use drugs.  Exam: Current vital signs: BP 105/67   Pulse 93   Temp 98.5 F (36.9 C) (Oral)   Resp (!) 21   Ht '5\' 2"'$  (1.575 m)   Wt 69.9 kg   SpO2 100%   BMI 28.17 kg/m  Vital signs in last 24 hours: Temp:  [98.5 F (36.9 C)] 98.5 F (36.9 C) (09/19 1303) Pulse Rate:  [87-113] 93 (09/19 1745) Resp:  [19-22] 21 (09/19 1745) BP: (105-163)/(67-124) 105/67 (09/19 1745) SpO2:  [92 %-100 %] 100 % (09/19 1745) Weight:  [69.9 kg] 69.9 kg (09/19 1557)   Physical Exam  Constitutional: Appears frail and elderly Psych: Affect mildly inappropriate, often jokes making examination particularly challenging as she finds it funny to not follow instructions Eyes: No scleral injection HENT: No oropharyngeal obstruction.  MSK: no joint deformities.  Cardiovascular: Normal rate and regular rhythm on the monitor.  Respiratory: Effort normal, non-labored breathing GI: Soft.  No distension. There is no tenderness.  Skin: Warm dry and intact visible skin  Neuro: Mental Status: Patient is  awake, alert, oriented to person, age, but does not answer other orientation questions Patient is able to give a limited and tangential history which is baseline per daughter She has difficulty naming pinky and mild difficulty repeating "no ifs, ands or buts" which daughter notes is slightly worse than her baseline Cranial Nerves: II: Visual Fields are full to orienting to wiggling fingers. Pupils are equal, round, and reactive to  light.  III,IV, VI: EOMI without ptosis or diploplia.  V: Facial sensation is symmetric to eyelash brush VII: Facial movement is symmetric.  VIII: hearing is intact to voice X: Uvula elevates symmetrically XI: Head turning symmetric XII: tongue is midline without atrophy or fasciculations.  Motor: Tone is normal. Bulk is normal.  Confrontational testing is limited by mild confusion.  However she is at least 4/5 throughout, and symmetric Sensory: Equally reactive to touch in all 4 extremities Deep Tendon Reflexes: 3+ and symmetric in the brachioradialis and patellae Cerebellar: Finger-nose is intact bilaterally  NIHSS total 7 Score breakdown: One-point for not knowing the month, one-point for each extremity weakness (4 points total), one-point for mild aphasia with inability to name some objects and repeat, one-point for mild dysarthria  I have reviewed labs in epic and the results pertinent to this consultation are: Creatinine 2.38 from baseline of 1.17, glucose initially 600 with anion gap of 27 and acidemia now improving  I have reviewed the images obtained: Head CT significantly motion limited, cerebellar hypodensity in the right cerebellum possible remote stroke versus cyst, possible right parietal extra-axial area of increased attenuation concerning for potential partially calcified meningioma versus artifact  Impression: Slurred speech and altered mental status in the setting of significant metabolic derangements secondary to DKA due to medication nonadherence.  Appreciate primary team's management of DKA.  Daughter reports that the patient's mental status has been improving with control of her blood sugar.  Given her baseline concern for dementia there is some risk that she suffers a stepwise decline in her function secondary to this event, but she may also recover well.  Given daughter is quite concerned about the MRI findings, and slurred speech is a poorly localizing finding that  could be associated with acute stroke, MRI brain is reasonable to pursue as the patient's mental status clears and she is able to lay still.  Examination is still limited at this time given her mental status and baseline personality, but overall reassuring against acute neurological process given she is improving with treatment of her metabolic derangements.  Given her recent falls and back pain, should patient not be making the expected progress in her strength with physical therapy and Occupational Therapy, please do reach out to neurology for reevaluation and consideration of further imaging  Recommendations: -MRI brain with and without contrast now that GFR is greater than 30 to clarify prior CT findings -B12/MMA, TSH, ammonia, thiamine levels, RPR, HIV for reversible causes of cognitive decline work-up, this can be followed up by primary team/PCP with treatment as needed.  Recommend empiric supplementation of B12 is less than 500, which can be discontinued if MMA subsequently results normal -PT/OT eval -Management of DKA and AKI per primary team -Neurology will follow up MRI brain but otherwise be available on an as-needed basis, please do reach out if patient continues to have significant weakness as her mental status and metabolic state improves -Outpatient neurology follow-up for possible essential tremor, cognitive decline (referral placed to [facility])  Lesleigh Noe MD-PhD [facility] [phone] Available 7 PM  to 7 AM, outside of these hours please call Neurologist on call as listed on Amion.

## 2021-08-27 NOTE — ED Notes (Signed)
Hospitalist at bedside assessing pt at this time.  

## 2021-08-27 NOTE — ED Provider Notes (Signed)
Emergency Medicine Provider Triage Evaluation Note  Sonya Duffy , a 75 y.o. female  was evaluated in triage.  Pt complains of not having insulin for 4 days.  She states that she has had slurred speech for 2 days.  Denies any fevers.  States she has not eaten in 4 days either..  Review of Systems  Positive: Slurred speech, frequent urination, hyperglycemia Negative: Kabbe, fever  Physical Exam  BP (!) 163/124 (BP Location: Right Arm)   Pulse (!) 113   Temp 98.5 F (36.9 C) (Oral)   Resp (!) 22   SpO2 100%  Gen:   Awake, no distress   Resp:  Normal effort  MSK:   Moves extremities without difficulty  Other:  Speech is slurred however mucous membranes are very dry.  Patient is given a few sips of water after which her slurring improved significantly.  She moves all 4 extremities and swallows without difficulty.  No obvious facial droop.  Medical Decision Making  Medically screening exam initiated at 1:13 PM.  Appropriate orders placed.  Sonya Duffy was informed that the remainder of the evaluation will be completed by another provider, this initial triage assessment does not replace that evaluation, and the importance of remaining in the ED until their evaluation is complete.  Note: Portions of this report may have been transcribed using voice recognition software. Every effort was made to ensure accuracy; however, inadvertent computerized transcription errors may be present    Lorin Glass, PA-C 08/27/21 1317    Lucrezia Starch, MD 09/03/21 [phone]

## 2021-08-27 NOTE — ED Notes (Signed)
This RN attempted Iv access twice without success, IV team consulted

## 2021-08-28 ENCOUNTER — Encounter (HOSPITAL_COMMUNITY): Payer: Self-pay | Admitting: Family Medicine

## 2021-08-28 ENCOUNTER — Inpatient Hospital Stay (HOSPITAL_COMMUNITY): Payer: Medicare Other

## 2021-08-28 ENCOUNTER — Other Ambulatory Visit (HOSPITAL_COMMUNITY): Payer: Medicare Other

## 2021-08-28 ENCOUNTER — Encounter (HOSPITAL_COMMUNITY): Payer: Medicare Other

## 2021-08-28 DIAGNOSIS — R4181 Age-related cognitive decline: Secondary | ICD-10-CM

## 2021-08-28 DIAGNOSIS — E0810 Diabetes mellitus due to underlying condition with ketoacidosis without coma: Secondary | ICD-10-CM | POA: Diagnosis not present

## 2021-08-28 DIAGNOSIS — R778 Other specified abnormalities of plasma proteins: Secondary | ICD-10-CM | POA: Diagnosis not present

## 2021-08-28 DIAGNOSIS — E1110 Type 2 diabetes mellitus with ketoacidosis without coma: Secondary | ICD-10-CM | POA: Diagnosis not present

## 2021-08-28 DIAGNOSIS — I214 Non-ST elevation (NSTEMI) myocardial infarction: Secondary | ICD-10-CM

## 2021-08-28 DIAGNOSIS — I6330 Cerebral infarction due to thrombosis of unspecified cerebral artery: Secondary | ICD-10-CM | POA: Insufficient documentation

## 2021-08-28 DIAGNOSIS — N179 Acute kidney failure, unspecified: Secondary | ICD-10-CM | POA: Diagnosis not present

## 2021-08-28 LAB — CBG MONITORING, ED
Glucose-Capillary: 123 mg/dL — ABNORMAL HIGH (ref 70–99)
Glucose-Capillary: 134 mg/dL — ABNORMAL HIGH (ref 70–99)
Glucose-Capillary: 164 mg/dL — ABNORMAL HIGH (ref 70–99)
Glucose-Capillary: 168 mg/dL — ABNORMAL HIGH (ref 70–99)
Glucose-Capillary: 175 mg/dL — ABNORMAL HIGH (ref 70–99)
Glucose-Capillary: 192 mg/dL — ABNORMAL HIGH (ref 70–99)
Glucose-Capillary: 196 mg/dL — ABNORMAL HIGH (ref 70–99)
Glucose-Capillary: 197 mg/dL — ABNORMAL HIGH (ref 70–99)
Glucose-Capillary: 203 mg/dL — ABNORMAL HIGH (ref 70–99)
Glucose-Capillary: 214 mg/dL — ABNORMAL HIGH (ref 70–99)

## 2021-08-28 LAB — BASIC METABOLIC PANEL
Anion gap: 8 (ref 5–15)
Anion gap: 11 (ref 5–15)
Anion gap: 10 (ref 5–15)
Anion gap: 9 (ref 5–15)
BUN: 27 mg/dL — ABNORMAL HIGH (ref 8–23)
BUN: 23 mg/dL (ref 8–23)
BUN: 22 mg/dL (ref 8–23)
BUN: 18 mg/dL (ref 8–23)
CO2: 19 mmol/L — ABNORMAL LOW (ref 22–32)
CO2: 18 mmol/L — ABNORMAL LOW (ref 22–32)
CO2: 19 mmol/L — ABNORMAL LOW (ref 22–32)
CO2: 20 mmol/L — ABNORMAL LOW (ref 22–32)
Calcium: 8.8 mg/dL — ABNORMAL LOW (ref 8.9–10.3)
Calcium: 8.5 mg/dL — ABNORMAL LOW (ref 8.9–10.3)
Calcium: 9.1 mg/dL (ref 8.9–10.3)
Calcium: 9.6 mg/dL (ref 8.9–10.3)
Chloride: 107 mmol/L (ref 98–111)
Chloride: 105 mmol/L (ref 98–111)
Chloride: 108 mmol/L (ref 98–111)
Chloride: 112 mmol/L — ABNORMAL HIGH (ref 98–111)
Creatinine, Ser: 1.35 mg/dL — ABNORMAL HIGH (ref 0.44–1.00)
Creatinine, Ser: 1.22 mg/dL — ABNORMAL HIGH (ref 0.44–1.00)
Creatinine, Ser: 1.43 mg/dL — ABNORMAL HIGH (ref 0.44–1.00)
Creatinine, Ser: 1.44 mg/dL — ABNORMAL HIGH (ref 0.44–1.00)
GFR, Estimated: 40 mL/min — ABNORMAL LOW (ref >60)
GFR, Estimated: 46 mL/min — ABNORMAL LOW (ref >60)
GFR, Estimated: 38 mL/min — ABNORMAL LOW (ref >60)
GFR, Estimated: 37 mL/min — ABNORMAL LOW (ref >60)
Glucose, Bld: 285 mg/dL — ABNORMAL HIGH (ref 70–99)
Glucose, Bld: 652 mg/dL (ref 70–99)
Glucose, Bld: 133 mg/dL — ABNORMAL HIGH (ref 70–99)
Glucose, Bld: 158 mg/dL — ABNORMAL HIGH (ref 70–99)
Potassium: 4.8 mmol/L (ref 3.5–5.1)
Potassium: 3.7 mmol/L (ref 3.5–5.1)
Potassium: 3.4 mmol/L — ABNORMAL LOW (ref 3.5–5.1)
Potassium: 3.4 mmol/L — ABNORMAL LOW (ref 3.5–5.1)
Sodium: 134 mmol/L — ABNORMAL LOW (ref 135–145)
Sodium: 134 mmol/L — ABNORMAL LOW (ref 135–145)
Sodium: 137 mmol/L (ref 135–145)
Sodium: 141 mmol/L (ref 135–145)

## 2021-08-28 LAB — BETA-HYDROXYBUTYRIC ACID
Beta-Hydroxybutyric Acid: 1.12 mmol/L — ABNORMAL HIGH (ref 0.05–0.27)
Beta-Hydroxybutyric Acid: 0.09 mmol/L (ref 0.05–0.27)
Beta-Hydroxybutyric Acid: 0.31 mmol/L — ABNORMAL HIGH (ref 0.05–0.27)

## 2021-08-28 LAB — TROPONIN I (HIGH SENSITIVITY)
Troponin I (High Sensitivity): 653 ng/L (ref <18)
Troponin I (High Sensitivity): 657 ng/L (ref <18)
Troponin I (High Sensitivity): 967 ng/L (ref <18)
Troponin I (High Sensitivity): 1001 ng/L (ref <18)

## 2021-08-28 LAB — LIPID PANEL
Cholesterol: 134 mg/dL (ref 0–200)
HDL: 19 mg/dL — ABNORMAL LOW (ref >40)
LDL Cholesterol: 90 mg/dL (ref 0–99)
Total CHOL/HDL Ratio: 7.1 RATIO
Triglycerides: 125 mg/dL (ref <150)
VLDL: 25 mg/dL (ref 0–40)

## 2021-08-28 LAB — TSH: TSH: 1.372 u[IU]/mL (ref 0.350–4.500)

## 2021-08-28 LAB — VITAMIN B12: Vitamin B-12: 147 pg/mL — ABNORMAL LOW (ref 180–914)

## 2021-08-28 LAB — GLUCOSE, CAPILLARY
Glucose-Capillary: 147 mg/dL — ABNORMAL HIGH (ref 70–99)
Glucose-Capillary: 153 mg/dL — ABNORMAL HIGH (ref 70–99)
Glucose-Capillary: 170 mg/dL — ABNORMAL HIGH (ref 70–99)
Glucose-Capillary: 205 mg/dL — ABNORMAL HIGH (ref 70–99)
Glucose-Capillary: 230 mg/dL — ABNORMAL HIGH (ref 70–99)
Glucose-Capillary: 225 mg/dL — ABNORMAL HIGH (ref 70–99)
Glucose-Capillary: 182 mg/dL — ABNORMAL HIGH (ref 70–99)
Glucose-Capillary: 125 mg/dL — ABNORMAL HIGH (ref 70–99)
Glucose-Capillary: 176 mg/dL — ABNORMAL HIGH (ref 70–99)

## 2021-08-28 LAB — POTASSIUM: Potassium: 3.7 mmol/L (ref 3.5–5.1)

## 2021-08-28 LAB — RPR: RPR Ser Ql: NONREACTIVE

## 2021-08-28 LAB — HIV ANTIBODY (ROUTINE TESTING W REFLEX): HIV Screen 4th Generation wRfx: NONREACTIVE

## 2021-08-28 LAB — HEMOGLOBIN A1C
Hgb A1c MFr Bld: 13.3 % — ABNORMAL HIGH (ref 4.8–5.6)
Mean Plasma Glucose: 335.01 mg/dL

## 2021-08-28 LAB — AMMONIA: Ammonia: 20 umol/L (ref 9–35)

## 2021-08-28 IMAGING — MR MR HEAD WO/W CM
14 of 18 series · 36 of 48 positions shown · IV contrast (gadavist)
Comparison: Head CT [DATE].

CLINICAL DATA: 75-year-old female with delirium and hypertension.
Weakness. Recent depression, stopped taking medications including
insulin.

EXAM:
MRI HEAD WITHOUT AND WITH CONTRAST
TECHNIQUE: Multiplanar, multiecho pulse sequences of the brain and surrounding
structures were obtained without and with intravenous contrast.
CONTRAST:  7mL GADAVIST GADOBUTROL 1 MMOL/ML IV SOLN

[Series 9: T1 · sagittal · 5.0mm · 0.75mm/px · 1 of 26 slices shown (1 of 2)]
[im 1/26]
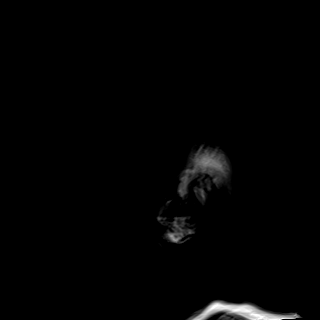

[Series 10: FLAIR · axial · 5.0mm · 0.47mm/px · z∈[-64,+90]mm · 2 of 29 slices shown (1 of 3)]
[im 1/29]
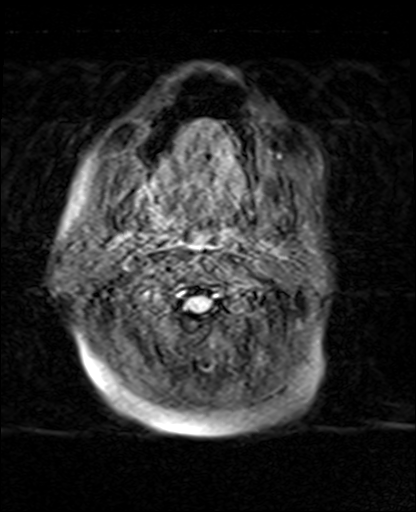
[im 29/29]
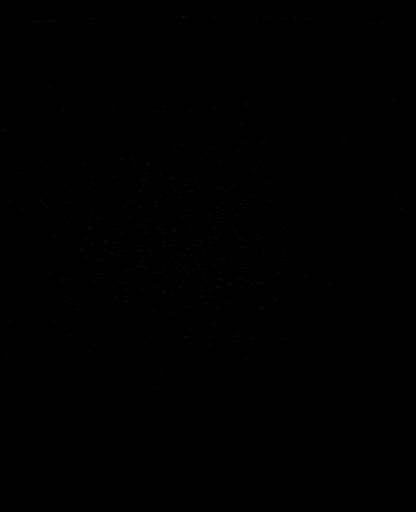

[Series 15: FLAIR · axial · 5.0mm · 0.47mm/px · z∈[-117,+47]mm · 2 of 29 slices shown (2 of 3)]
[im 1/29]
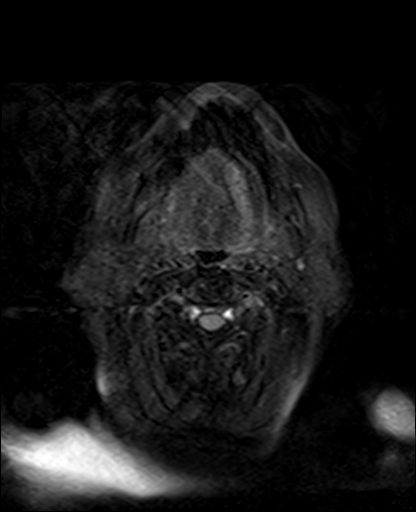
[im 29/29]
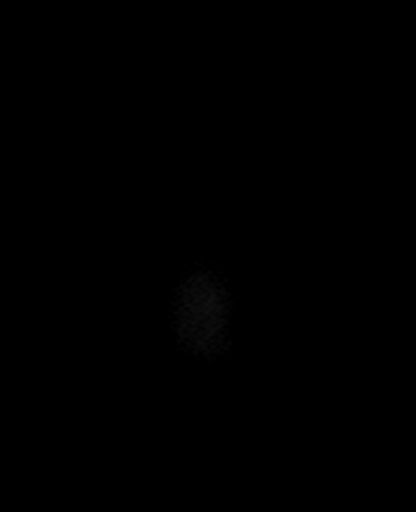

[Series 16: DWI · axial · 3.0mm · 0.92mm/px · z∈[-106,+52]mm · 6 of 109 slices shown (1 of 4)]
[im 1/109]
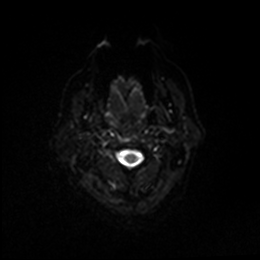
[im 22/109]
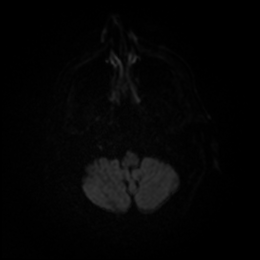
[im 44/109]
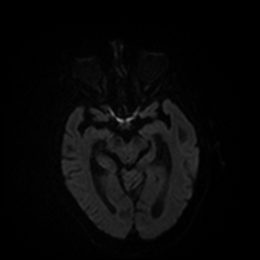
[im 65/109]
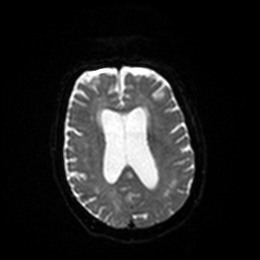
[im 87/109]
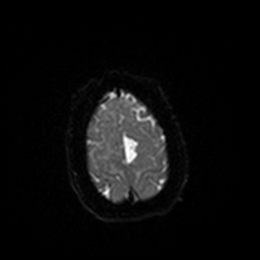
[im 109/109]
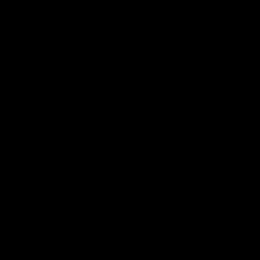

[Series 17: DWI · axial · 3.0mm · 0.92mm/px · z∈[-106,+46]mm · 3 of 53 slices shown (2 of 4)]
[im 1/53]
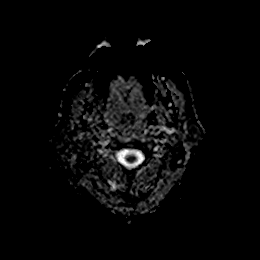
[im 27/53]
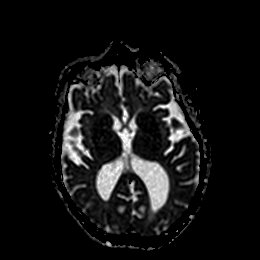
[im 53/53]
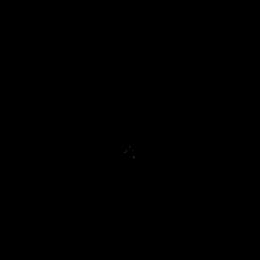

[Series 18: DWI · coronal · 4.0mm · 0.88mm/px · 4 of 76 slices shown (3 of 4)]
[im 1/76]
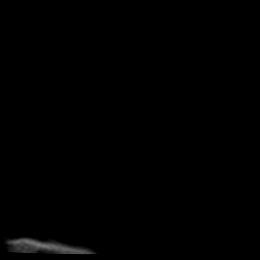
[im 26/76]
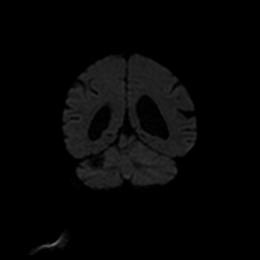
[im 51/76]
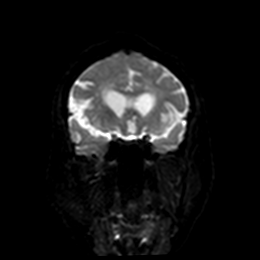
[im 76/76]
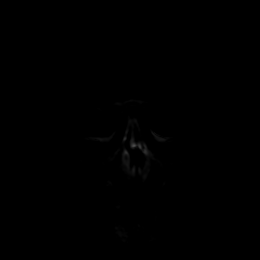

[Series 19: DWI · coronal · 4.0mm · 0.88mm/px · 2 of 38 slices shown (4 of 4)]
[im 1/38]
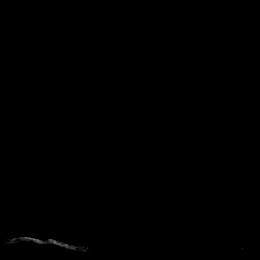
[im 38/38]
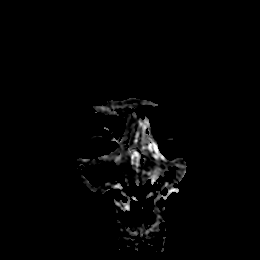

[Series 20: mag_images · axial · 3.0mm · 0.94mm/px · z∈[-108,+42]mm · 3 of 52 slices shown]
[im 1/52]
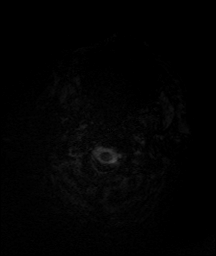
[im 26/52]
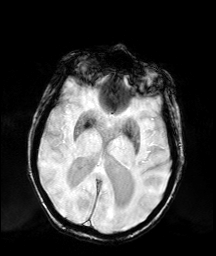
[im 52/52]
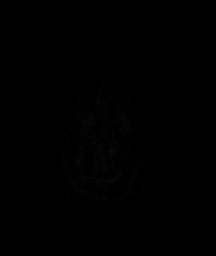

[Series 21: pha_images · axial · 3.0mm · 0.94mm/px · z∈[-108,+39]mm · 3 of 50 slices shown]
[im 1/50]
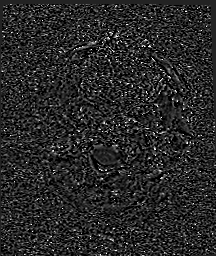
[im 25/50]
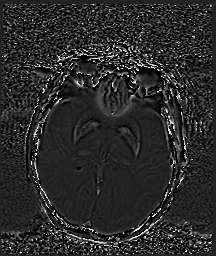
[im 50/50]
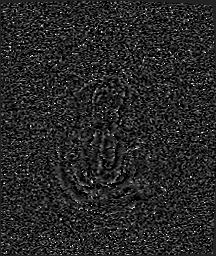

[Series 24: FLAIR · axial · 5.0mm · 0.47mm/px · z∈[-113,+47]mm · 2 of 28 slices shown (3 of 3)]
[im 1/28]
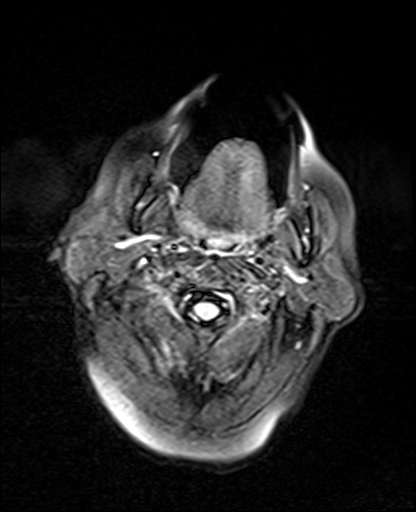
[im 28/28]
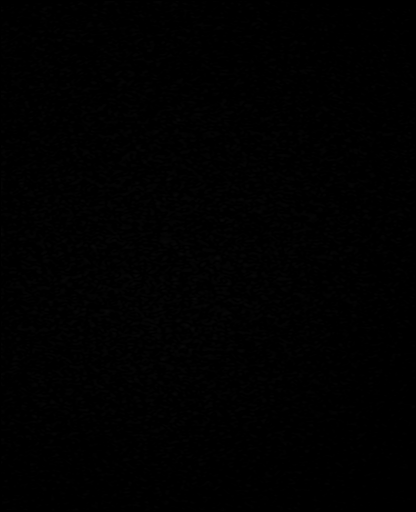

[Series 26: T2 · coronal · 5.0mm · 0.34mm/px · 2 of 29 slices shown]
[im 1/29]
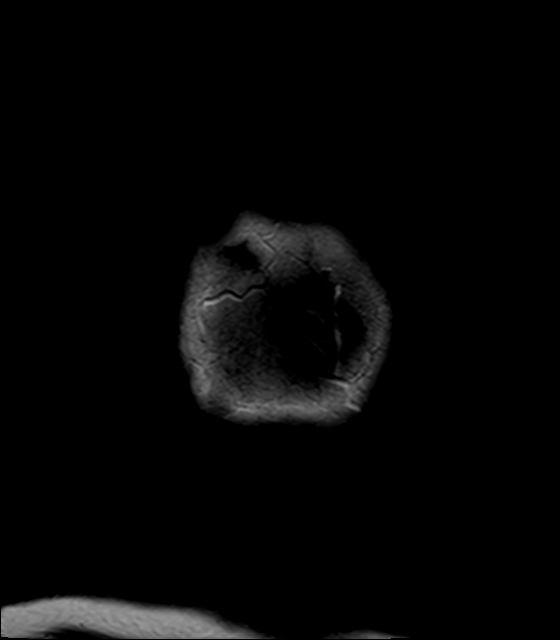
[im 29/29]
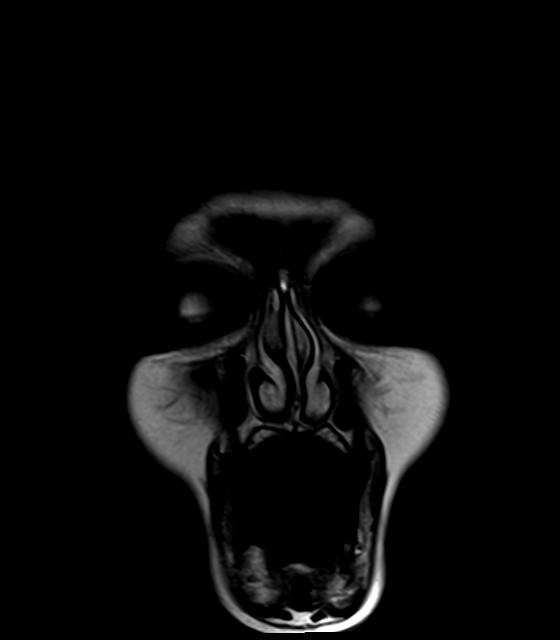

[Series 27: T1 · sagittal · 5.0mm · 0.75mm/px · 2 of 26 slices shown (2 of 2)]
[im 1/26]
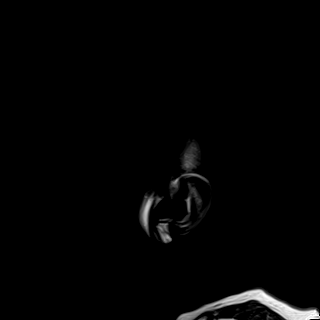
[im 26/26]
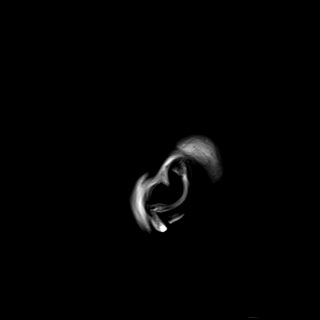

[Series 28: T2 post-contrast · coronal · 5.0mm · 0.72mm/px · 2 of 31 slices shown]
[im 1/31]
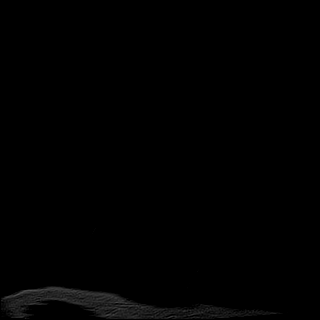
[im 31/31]
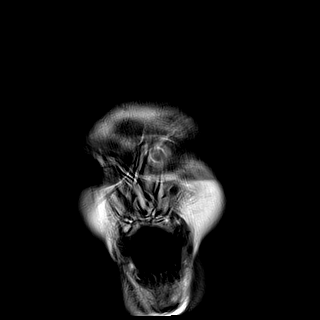

[Series 30: T1 post-contrast · coronal · 5.0mm · 0.34mm/px · 2 of 32 slices shown]
[im 1/32]
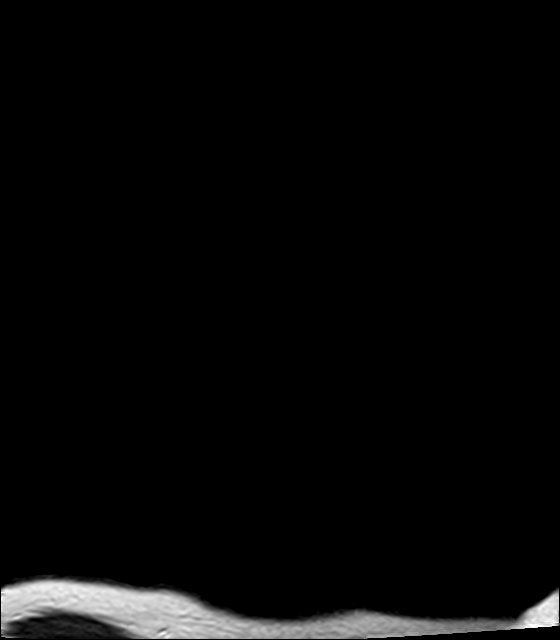
[im 32/32]
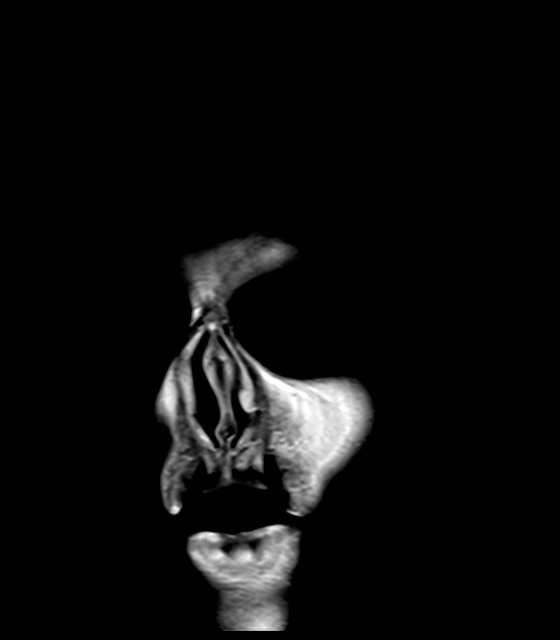

[36 of 48 positions shown; findings below may reference images not displayed]

FINDINGS: Study is intermittently degraded by motion artifact despite repeated
imaging attempts, and no axial T2 weighted imaging could be
obtained.

Brain: Linear and rounded roughly 14 mm area of abnormal diffusion
in the left paracentral pons (series 16, image 73 and series 18,
image 58) is mildly restricted on ADC and associated with FLAIR
hyperintensity. There is a microhemorrhage there in the left pons
(series 22, image 17) which could be acute or chronic. No
significant mass effect. There is patchy post ischemic enhancement
on series 29, image 22.

No other abnormal diffusion. Patchy chronic right superior
cerebellar infarct with encephalomalacia. Patchy bilateral cerebral
white matter FLAIR hyperintensity. Negative for age deep gray matter
nuclei.

Lateral and 3rd ventriculomegaly with diminutive cerebral aqueduct
and 4th ventricle. No transependymal edema suspected, in the
temporal horns remain fairly diminutive.

Smooth oval homogeneously enhancing extra-axial mass along the
posterior right temporal lobe measures 15 mm diameter, 7 mm in
thickness, and there is regional associated dural thickening. No
significant mass effect. No regional cerebral edema.

No superimposed midline shift. Cervicomedullary junction and
pituitary are within normal limits. No other abnormal intracranial
enhancement or dural thickening identified.

Vascular: Major intracranial vascular flow voids appear preserved on
the TIGER coronal T2 weighted imaging. The major dural venous sinuses
are enhancing and appear to be patent.

Skull and upper cervical spine: Negative visible cervical spine.
Visualized bone marrow signal is within normal limits.

Sinuses/Orbits: Negative orbits. Paranasal sinuses and mastoids are
stable and well aerated.

Other: Grossly normal visible internal auditory structures.
IMPRESSION: 1. Subacute infarct of the left paracentral pons. Associated
microhemorrhage but no malignant hemorrhagic transformation or mass
effect.

2. Underlying chronic right SCA territory infarct.

3. Small 15 mm meningioma along the right convexity with no mass
effect or associated edema.

4. Appearance suggestive of chronic or congenital cerebral aqueduct
stenosis. But no acute ventriculomegaly or transependymal edema
suspected.

## 2021-08-28 IMAGING — CT CT ANGIO HEAD-NECK (W OR W/O PERF)
2 of 11 series · 6 of 36 positions shown · IV contrast (omnipaque)
Comparison: Same day MRI.  CT head [DATE].

CLINICAL DATA: Stroke, follow up

EXAM:
CT ANGIOGRAPHY HEAD AND NECK
TECHNIQUE: Multidetector CT imaging of the head and neck was performed using
the standard protocol during bolus administration of intravenous
contrast. Multiplanar CT image reconstructions and MIPs were
obtained to evaluate the vascular anatomy. Carotid stenosis
measurements (when applicable) are obtained utilizing NASCET
criteria, using the distal internal carotid diameter as the
denominator.
CONTRAST:  75mL OMNIPAQUE IOHEXOL 350 MG/ML SOLN

[Series 10: cta neck axial · axial · 0.36mm/px · z∈[-240,-36]mm · 5 of 307 slices shown]
[im 52/307  soft-tissue]
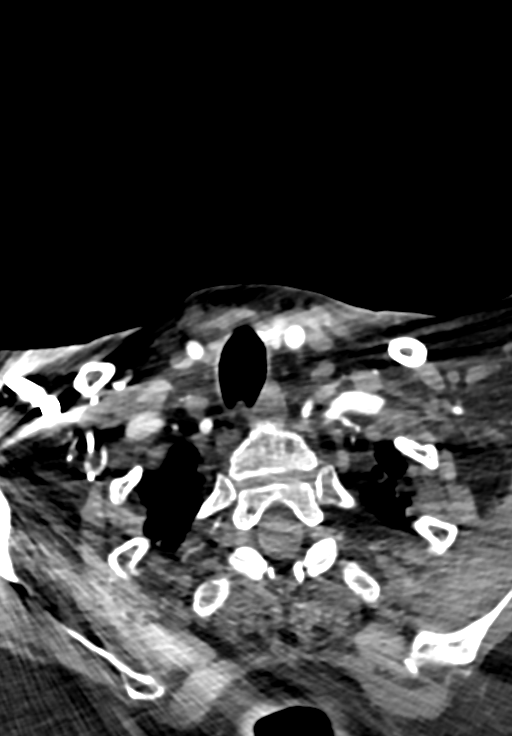
[im 103/307  bone]
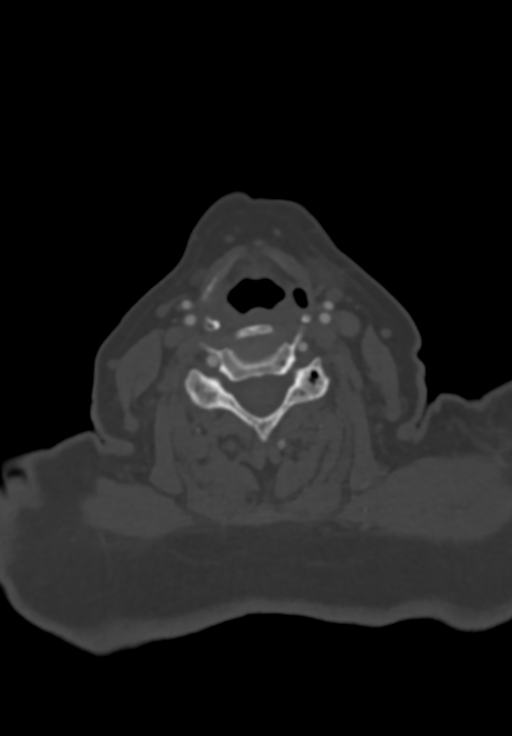
[im 154/307  soft-tissue]
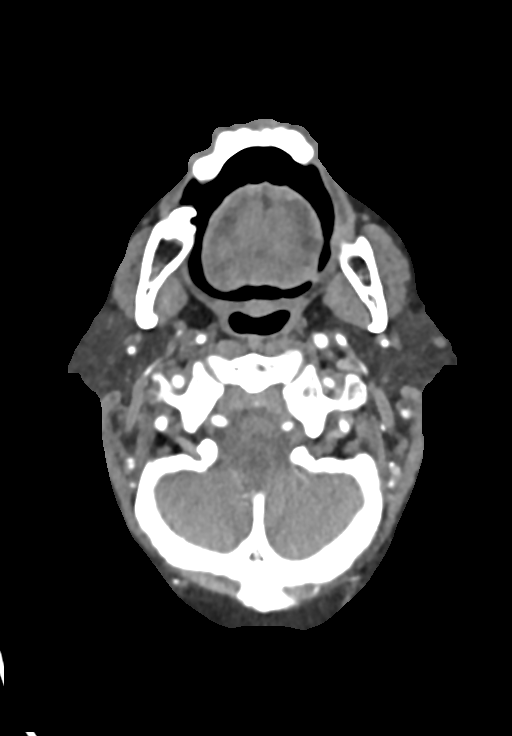
[im 205/307  bone]
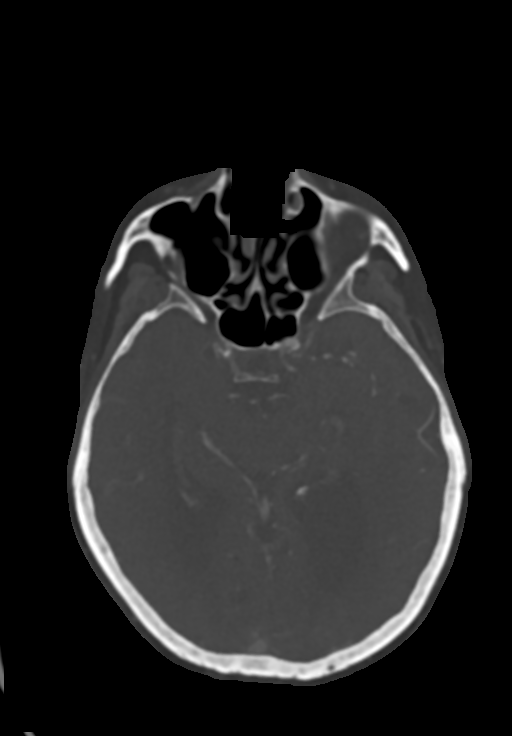
[im 256/307  soft-tissue]
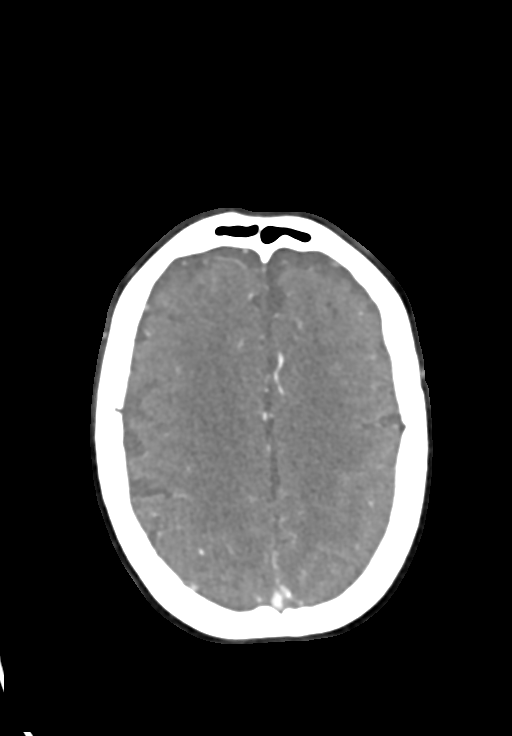

[Series 12: cta neck sagittal · sagittal · 0.50mm/px · 1 of 201 slices shown]
[im 42/201  soft-tissue]
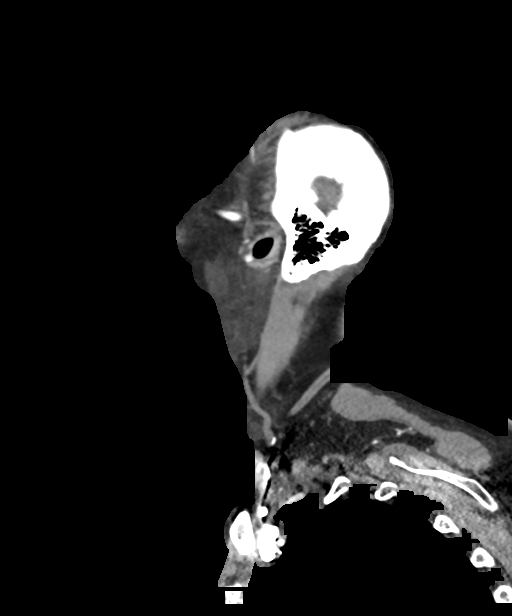

[6 of 36 positions shown; findings below may reference images not displayed]

FINDINGS: CT HEAD FINDINGS

Brain: Left paramidline pontine acute/subacute infarct, better
characterized on recent MRI. No evidence of acute hemorrhage. Mild
right cerebellar infarct. Approximately 15 mm extra-axial,
dural-based lesion along the right cerebral convexity, compatible
with meningioma that was better characterized on recent MRI. Similar
size of the ventricular system. No midline shift. No extra-axial
fluid collection.

Vascular: See below.

Skull: No acute fracture.

Sinuses: Clear sinuses. Chronic deformity of the nasal bones. No
acute orbital findings.

Orbits: No mastoid effusions.

Review of the MIP images confirms the above findings

CTA NECK FINDINGS

Aortic arch: Great vessel origins are patent.

Right carotid system: Atherosclerosis at the carotid bifurcation
without greater than 50% stenosis relative to the distal vessel. The
ICA is also mildly narrowed as it courses along the posterior aspect
of the hyoid bone in the upper neck with retropharyngeal course.

Left carotid system: Atherosclerosis of the carotid bifurcation
without greater than 50% stenosis relative to the distal vessel.

Vertebral arteries: Right dominant. Bilateral vertebral arteries are
patent without significant pancreatic 50%) stenosis.

Skeleton: Multilevel degenerative change.

Other neck: No acute findings.

Upper chest: Biapical pleural-parenchymal scarring. Otherwise,
visualized lung apices are clear.

Review of the MIP images confirms the above findings

CTA HEAD FINDINGS

Anterior circulation: Severe stenosis of the right paraclinoid ICA
and right ICA terminus. Mild narrowing of the left intracranial ICA.
Bilateral M1 and proximal M2 MCA branches are patent. Moderate
stenosis of the distal right M1 MCA with otherwise mild multifocal
stenosis of the MCAs bilaterally. Early left MCA bifurcation.
Bilateral ICAs are patent with small right A1 ACA, likely in part
congenital.

Posterior circulation: Small/non dominant intradural vertebral
artery with superimposed severe stenosis distally. Dominant right
vertebral artery is patent without significant stenosis. Moderate
(approximately 50%) stenosis of the mid to distal basilar artery,
which remains patent. PCAs are small bilaterally with multifocal
severe stenosis of the left P2 PCA. Mild multifocal stenosis of the
right PCA.

Venous sinuses: As permitted by contrast timing, patent.

Anatomic variants: See above.

Review of the MIP images confirms the above findings
IMPRESSION: CT head:

1. Left paramidline acute/subacute pontine infarct better
characterized on same-day MRI.
2. Remote right cerebellar infarct.
3. Redemonstrated 15 mm right cerebral convexity meningioma without
significant mass effect.

CTA head:

1. Severe stenosis of the right paraclinoid ICA and right ICA
terminus with moderate stenosis of the distal right M1 MCA.
2. Moderate (approximately 50%) stenosis of the mid to distal
basilar artery and severe stenosis of the non-dominant left
intradural vertebral artery distally.
3. Small bilateral PCAs with multifocal severe stenosis of the left
P2 PCA.

CTA neck:

Bilateral carotid bifurcation atherosclerosis without greater than
50% stenosis relative to the distal vessels.

## 2021-08-28 IMAGING — DX DG CHEST 1V PORT
1 series · 1 of 1 positions shown · non-contrast
Comparison: Chest x-ray [DATE].

CLINICAL DATA: Fever.

EXAM:
PORTABLE CHEST 1 VIEW

[chest ap]
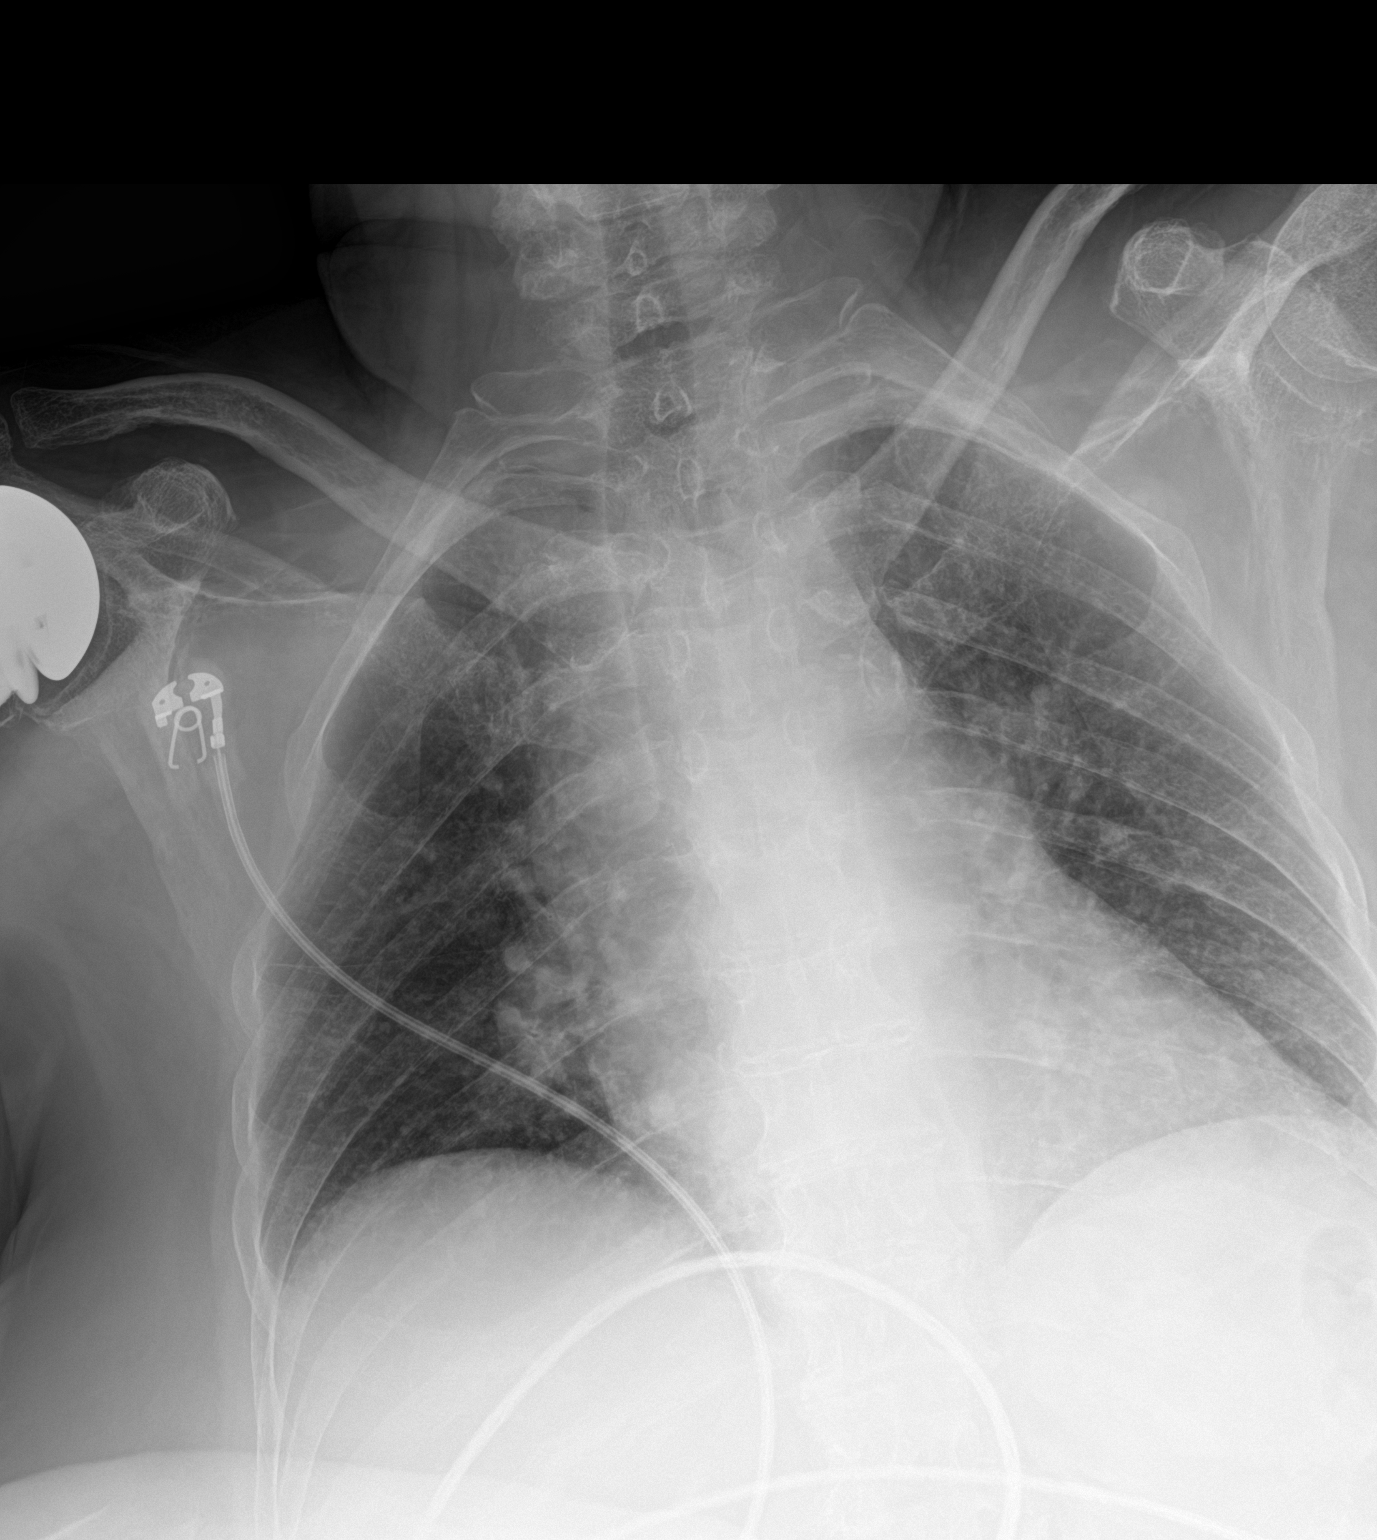

[1 of 1 positions shown; findings below may reference images not displayed]

FINDINGS: The left costophrenic angle has been excluded from the examination.
There is no focal lung consolidation, pleural effusion or
pneumothorax. The cardiomediastinal silhouette is within normal
limits. No acute fractures are seen. There is a right shoulder
arthroplasty.
IMPRESSION: No active disease.

## 2021-08-28 MED ORDER — ATORVASTATIN CALCIUM 40 MG PO TABS
40.0000 mg | ORAL_TABLET | Freq: Every day | ORAL | Status: DC
  Administered 2021-08-29 – 2021-09-05 (×8): 40 mg via ORAL
  Filled 2021-08-28 (×8): qty 1

## 2021-08-28 MED ORDER — IOHEXOL 350 MG/ML SOLN
75.0000 mL | Freq: Once | INTRAVENOUS | Status: AC | PRN
  Administered 2021-08-28: 75 mL via INTRAVENOUS

## 2021-08-28 MED ORDER — POTASSIUM CHLORIDE 10 MEQ/100ML IV SOLN
10.0000 meq | Freq: Once | INTRAVENOUS | Status: AC
  Administered 2021-08-28: 10 meq via INTRAVENOUS
  Filled 2021-08-28: qty 100

## 2021-08-28 MED ORDER — GADOBUTROL 1 MMOL/ML IV SOLN
7.0000 mL | Freq: Once | INTRAVENOUS | Status: AC | PRN
  Administered 2021-08-28: 7 mL via INTRAVENOUS

## 2021-08-28 MED ORDER — FAMOTIDINE 20 MG PO TABS
20.0000 mg | ORAL_TABLET | Freq: Once | ORAL | Status: AC
  Administered 2021-08-28: 20 mg via ORAL
  Filled 2021-08-28: qty 1

## 2021-08-28 MED ORDER — LACTATED RINGERS IV SOLN: INTRAVENOUS | Status: AC

## 2021-08-28 MED ORDER — ACETAMINOPHEN 650 MG RE SUPP
650.0000 mg | Freq: Once | RECTAL | Status: AC
  Administered 2021-08-28: 650 mg via RECTAL
  Filled 2021-08-28: qty 1

## 2021-08-28 MED ORDER — LORAZEPAM 2 MG/ML IJ SOLN
0.2500 mg | Freq: Once | INTRAMUSCULAR | Status: AC
  Administered 2021-08-28: 0.25 mg via INTRAVENOUS
  Filled 2021-08-28: qty 1

## 2021-08-28 MED ORDER — POTASSIUM CHLORIDE 10 MEQ/100ML IV SOLN
INTRAVENOUS | Status: AC
  Administered 2021-08-28: 10 meq via INTRAVENOUS
  Filled 2021-08-28: qty 100

## 2021-08-28 MED ORDER — ASPIRIN EC 81 MG PO TBEC: 81.0000 mg | DELAYED_RELEASE_TABLET | Freq: Every day | ORAL | Status: DC

## 2021-08-28 MED ORDER — INSULIN GLARGINE-YFGN 100 UNIT/ML ~~LOC~~ SOLN
20.0000 [IU] | Freq: Every day | SUBCUTANEOUS | Status: DC
  Administered 2021-08-28 – 2021-08-29 (×2): 20 [IU] via SUBCUTANEOUS
  Filled 2021-08-28 (×2): qty 0.2

## 2021-08-28 MED ORDER — LORAZEPAM 2 MG/ML IJ SOLN
0.2500 mg | Freq: Once | INTRAMUSCULAR | Status: AC
  Administered 2021-08-28: 0.25 mg via INTRAVENOUS

## 2021-08-28 MED ORDER — CYANOCOBALAMIN 1000 MCG/ML IJ SOLN
1000.0000 ug | Freq: Every day | INTRAMUSCULAR | Status: AC
  Administered 2021-08-28 – 2021-09-03 (×7): 1000 ug via INTRAMUSCULAR
  Filled 2021-08-28 (×7): qty 1

## 2021-08-28 MED ORDER — INSULIN ASPART 100 UNIT/ML IJ SOLN
0.0000 [IU] | Freq: Three times a day (TID) | INTRAMUSCULAR | Status: DC
  Administered 2021-08-29: 5 [IU] via SUBCUTANEOUS

## 2021-08-28 MED ORDER — VITAMIN B-12 1000 MCG PO TABS
1000.0000 ug | ORAL_TABLET | Freq: Every day | ORAL | Status: DC
  Administered 2021-09-04 – 2021-09-05 (×2): 1000 ug via ORAL
  Filled 2021-08-28 (×2): qty 1

## 2021-08-28 MED ORDER — ASPIRIN 81 MG PO CHEW
CHEWABLE_TABLET | ORAL | Status: AC
  Filled 2021-08-28: qty 4

## 2021-08-28 MED ORDER — METOPROLOL TARTRATE 25 MG PO TABS
25.0000 mg | ORAL_TABLET | Freq: Two times a day (BID) | ORAL | Status: DC
  Administered 2021-08-29 – 2021-09-05 (×15): 25 mg via ORAL
  Filled 2021-08-28 (×15): qty 1

## 2021-08-28 MED ORDER — CLOPIDOGREL BISULFATE 75 MG PO TABS
75.0000 mg | ORAL_TABLET | Freq: Every day | ORAL | Status: DC
  Administered 2021-08-29 – 2021-09-05 (×8): 75 mg via ORAL
  Filled 2021-08-28 (×8): qty 1

## 2021-08-28 MED ORDER — ASPIRIN EC 325 MG PO TBEC
325.0000 mg | DELAYED_RELEASE_TABLET | Freq: Every day | ORAL | Status: DC
  Administered 2021-08-29 – 2021-08-30 (×2): 325 mg via ORAL
  Filled 2021-08-28 (×2): qty 1

## 2021-08-28 MED ORDER — ASPIRIN 325 MG PO TABS
325.0000 mg | ORAL_TABLET | Freq: Every day | ORAL | Status: DC
  Administered 2021-08-28: 325 mg via ORAL

## 2021-08-28 NOTE — Progress Notes (Addendum)
STROKE TEAM PROGRESS NOTE   ATTENDING NOTE: I reviewed above note and agree with the assessment and plan. Pt was seen and examined.   75 year old female with history of diabetes, hypertension, cognitive impairment admitted for worsening confusion, cognitive decline, slurred speech for the last 1 to 2 weeks.  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  MRI showed subacute left pontine infarct, and remote right cerebellar SCA territory infarct.  CTA head neck severe stenosis of the right ICA siphon and distal right M1, moderate stenosis mid to distal basilar artery and severe stenosis of 9 dominant left VA, and multifocal severe stenosis of left P2.  2D echo pending, A1c 13.3, LDL 90, UDS negative.  Creatinine 1.52->1.35, WBC 15.5.  On exam, patient daughter at bedside, patient lying sideways in the bed, daughter has reported patient agitated today after receiving Ativan for the MRI and did not get good sleep last night.  Hard to arouse, barely open eyes with pain stimulation.  Able to tell me her first name after repetitive questioning, severe dysarthria, not answering further questions.  With forced eye opening, eyes midline, PERRL.  However, not blinking to visual threat bilaterally.  No significant facial droop, moving all extremities symmetrically with painful stimulation. Sensation, coordination and gait not tested.  Etiology for patient stroke likely due to small versus large vessel disease.  Given CTA head showed moderate stenosis of basilar artery, recommend aspirin 325 and Plavix 75 DAPT for 3 months and then aspirin alone.  Continue Lipitor 40.  Cardiology also consulted for elevated troponin.  Currently off insulin drip.  PT/OT pending.  For detailed assessment and plan, please refer to below as I have made changes wherever appropriate.   Neurology will sign off. Please call with questions. Pt will follow up with stroke clinic NP at [facility] in about 4 weeks. Thanks for the  consult.   Sonya Hawking, Sonya Duffy Stroke Neurology 08/28/2021 7:34 PM    INTERVAL HISTORY Her family is at the bedside.  Patient screaming with movement. Alert, NAD.  Vitals:   08/28/21 0930 08/28/21 1000 08/28/21 1032 08/28/21 1109  BP: 125/61 (!) 159/81  124/61  Pulse: 88 91  92  Resp: 18 (!) 21  18  Temp:   98.4 F (36.9 C)   TempSrc:   Oral   SpO2: 98% 99%  98%  Weight:      Height:       CBC:  Recent Labs  Lab 08/27/21 1313 08/27/21 1336  WBC 15.5*  --   NEUTROABS 11.9*  --   HGB 12.5 13.9  HCT 40.0 41.0  MCV 89.1  --   PLT 406*  --    Basic Metabolic Panel:  Recent Labs  Lab 08/28/21 0753 08/28/21 1231  NA 134* 137  K 3.7 3.4*  CL 105 108  CO2 18* 19*  GLUCOSE 652* 133*  BUN 23 22  CREATININE 1.22* 1.43*  CALCIUM 8.5* 9.1   Lipid Panel:  Recent Labs  Lab 08/28/21 0815  CHOL 134  TRIG 125  HDL 19*  CHOLHDL 7.1  VLDL 25  LDLCALC 90   HgbA1c:  Recent Labs  Lab 08/28/21 0010  HGBA1C 13.3*   Urine Drug Screen:  Recent Labs  Lab 08/27/21 1835  LABOPIA NONE DETECTED  COCAINSCRNUR NONE DETECTED  LABBENZ NONE DETECTED  AMPHETMU NONE DETECTED  THCU NONE DETECTED  LABBARB NONE DETECTED     IMAGING past 24 hours CT ANGIO HEAD NECK W WO CM  Result Date:  08/28/2021 CLINICAL DATA:  Stroke, follow up EXAM: CT ANGIOGRAPHY HEAD AND NECK TECHNIQUE: Multidetector CT imaging of the head and neck was performed using the standard protocol during bolus administration of intravenous contrast. Multiplanar CT image reconstructions and MIPs were obtained to evaluate the vascular anatomy. Carotid stenosis measurements (when applicable) are obtained utilizing NASCET criteria, using the distal internal carotid diameter as the denominator. CONTRAST:  12mL OMNIPAQUE IOHEXOL 350 MG/ML SOLN COMPARISON:  Same day MRI.  CT head 08/27/2021. FINDINGS: CT HEAD FINDINGS Brain: Left paramidline pontine acute/subacute infarct, better characterized on recent MRI. No evidence  of acute hemorrhage. Mild right cerebellar infarct. Approximately 15 mm extra-axial, dural-based lesion along the right cerebral convexity, compatible with meningioma that was better characterized on recent MRI. Similar size of the ventricular system. No midline shift. No extra-axial fluid collection. Vascular: See below. Skull: No acute fracture. Sinuses: Clear sinuses. Chronic deformity of the nasal bones. No acute orbital findings. Orbits: No mastoid effusions. Review of the MIP images confirms the above findings CTA NECK FINDINGS Aortic arch: Great vessel origins are patent. Right carotid system: Atherosclerosis at the carotid bifurcation without greater than 50% stenosis relative to the distal vessel. The ICA is also mildly narrowed as it courses along the posterior aspect of the hyoid bone in the upper neck with retropharyngeal course. Left carotid system: Atherosclerosis of the carotid bifurcation without greater than 50% stenosis relative to the distal vessel. Vertebral arteries: Right dominant. Bilateral vertebral arteries are patent without significant pancreatic 50%) stenosis. Skeleton: Multilevel degenerative change. Other neck: No acute findings. Upper chest: Biapical pleural-parenchymal scarring. Otherwise, visualized lung apices are clear. 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 Mild narrowing of the left intracranial ICA. Bilateral M1 and proximal M2 MCA branches are patent. Moderate stenosis of the distal right M1 MCA with otherwise mild multifocal stenosis of the MCAs bilaterally. Early left MCA bifurcation. Bilateral ICAs are patent with small right A1 ACA, likely in part congenital. Posterior circulation: Small/non dominant intradural vertebral artery with superimposed severe stenosis distally. Dominant right vertebral artery is patent without significant stenosis. Moderate  (approximately 50%) stenosis of the mid to distal basilar artery, which remains patent. PCAs are small bilaterally with multifocal severe stenosis of the left P2 PCA. Mild multifocal stenosis of the right PCA. Venous sinuses: As permitted by contrast timing, patent. Anatomic variants: See above. Review of the MIP images confirms the above findings IMPRESSION: CT head: 1. Left paramidline acute/subacute pontine infarct better characterized on same-day MRI. 2. Remote right cerebellar infarct. 3. Redemonstrated 15 mm right cerebral convexity meningioma without significant mass effect. CTA head: 1. 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 2. Moderate (approximately 50%) stenosis of the mid to distal basilar artery and severe stenosis of the non-dominant left intradural vertebral artery distally. 3. Small bilateral PCAs with multifocal severe stenosis of the left P2 PCA. CTA neck: Bilateral carotid bifurcation atherosclerosis without greater than 50% stenosis relative to the distal vessels. Electronically Signed   By: Margaretha Sheffield M.D.   On: 08/28/2021 09:59   MR BRAIN W WO CONTRAST  Result Date: 08/28/2021 CLINICAL DATA:  75 year old female with delirium and hypertension. Weakness. Recent depression, stopped taking medications including insulin. EXAM: MRI HEAD WITHOUT AND WITH CONTRAST TECHNIQUE: Multiplanar, multiecho pulse sequences of the brain and surrounding structures were obtained without  and with intravenous contrast. CONTRAST:  29mL GADAVIST GADOBUTROL 1 MMOL/ML IV SOLN COMPARISON:  Head CT 08/27/2021. FINDINGS: Study is intermittently degraded by motion artifact despite repeated imaging attempts, and no axial T2 weighted imaging could be obtained. Brain: Linear and rounded roughly 14 mm area of abnormal diffusion in the left paracentral pons (series 16, image 73 and series 18, image 58) is mildly restricted on ADC and  associated with FLAIR hyperintensity. There is a microhemorrhage there in the left pons (series 22, image 17) which could be acute or chronic. No significant mass effect. There is patchy post ischemic enhancement on series 29, image 22. No other abnormal diffusion. Patchy chronic right superior cerebellar infarct with encephalomalacia. Patchy bilateral cerebral white matter FLAIR hyperintensity. Negative for age deep gray matter nuclei. Lateral and 3rd ventriculomegaly with diminutive cerebral aqueduct and 4th ventricle. No transependymal edema suspected, in the temporal horns remain fairly diminutive. Smooth oval homogeneously enhancing extra-axial mass along the posterior right temporal lobe measures 15 mm diameter, 7 mm in thickness, and there is regional associated dural thickening. No significant mass effect. No regional cerebral edema. No superimposed midline shift. Cervicomedullary junction and pituitary are within normal limits. No other abnormal intracranial enhancement or dural thickening identified. Vascular: Major intracranial vascular flow voids appear preserved on the lone coronal T2 weighted imaging. The major dural venous sinuses are enhancing and appear to be patent. Skull and upper cervical spine: Negative visible cervical spine. Visualized bone marrow signal is within normal limits. Sinuses/Orbits: Negative orbits. Paranasal sinuses and mastoids are stable and well aerated. Other: Grossly normal visible internal auditory structures. IMPRESSION: 1. Subacute infarct of the left paracentral pons. Associated microhemorrhage but no malignant hemorrhagic transformation or mass effect. 2. Underlying chronic right SCA territory infarct. 3. Small 15 mm meningioma along the right convexity with no mass effect or associated edema. 4. 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 But no acute ventriculomegaly or transependymal edema suspected. Electronically Signed   By: Genevie Ann  M.D.   On: 08/28/2021 07:43    PHYSICAL EXAM Physical Exam  Constitutional: Appears well-developed and well-nourished.  Psych: Affect appropriate to situation Eyes: Normal external eye and conjunctiva. HENT: Normocephalic, no lesions, without obvious abnormality.   Musculoskeletal-no joint tenderness, deformity or swelling Cardiovascular: Normal rate and regular rhythm.  Respiratory: Effort normal, non-labored breathing saturations WNL GI: Soft.  No distension. There is no tenderness.  Skin: WDI   Neuro:  Mental Status: Alert, oriented to name and age.  Cranial Nerves: II:  Visual fields grossly normal,  III,IV, VI: ptosis not present, extra-ocular motions intact bilaterally pupils equal, round, reactive to light and accommodation V,VII: smile symmetric, facial light touch sensation normal bilaterally VIII: hearing normal bilaterally IX,X: uvula rises symmetrically XI: bilateral shoulder shrug XII: midline tongue extension Motor: Anti gravity in all 4 extremities Tone and bulk:normal tone throughout; no atrophy noted Cerebellar: normal finger-to-nose,  Gait: deferred     ASSESSMENT/PLAN Sonya Duffy is a 75 y.o. female female with past medical history significant for insulin-dependent diabetes and hypertension, intellectual delay/illiteracy.   The patient and her daughter notes that the patient's cat has been staying outside of the house recently and refuses to come back inside the house which led the patient to be sad and stopped taking her insulin.  In that setting she began to have intermittent nausea and vomiting as well as diarrhea, generalized weakness, slurred speech that was progressive.  Daughter also notes a progressive head tremor  over the past few years and some back pain secondary to falls.   At baseline, [place] and [place] finish school, her daughter has been managing the finances since she was aged 58 and the patient  has never driven.  Despite that she manages her medications as well as her son's medications.  Daughter is concerned about some cognitive decline over the past 1 year that has been gradually progressive and subtle.  Denies any concern for seizure-like activity.  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  CTA head 1. 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 2. Moderate (approximately 50%) stenosis of the mid to distal basilar artery and severe stenosis of the non-dominant left intradural vertebral artery distally. 3. Small bilateral PCAs with multifocal severe stenosis of the left P2 PCA. CTA  neck Bilateral carotid bifurcation atherosclerosis without greater than 50% stenosis relative to the distal vessels.  MRI  . Subacute infarct of the left paracentral pons. Associated microhemorrhage but no malignant hemorrhagic transformation or mass effect. 2D Echo pending LDL 90 HgbA1c 13.3 VTE prophylaxis - lovenox    Diet   Diet NPO time specified Except for: Other (See Comments), Ice Chips   No antithrombotic prior to admission, now on aspirin 81 mg daily and clopidogrel 75 mg daily. Contine ASA and plavix for 3 weeks and then ASA alone.  Therapy recommendations:  pending Disposition:  pending  Hypertension Home meds:  Losartan Stable Permissive hypertension (OK if < 220/120) but gradually normalize in 5-7 days Long-term BP goal normotensive  Hyperlipidemia Home meds:  none,  LDL 90, goal < 70 Add atorvastatin 20 mg daily   Continue statin at discharge  Diabetes type II UNControlled Home meds:  metformin 1000mg  HgbA1c 13.3, goal < 7.0 CBGs Recent Labs    08/28/21 1347 08/28/21 1445 08/28/21 1553  GLUCAP 153* 170* 205*    SSI  Other Stroke Risk Factors Advanced Age >/=  62   Other Active Problems Elevated troponin level > cardiology consulted  Hospital day # Coleman, MSN, NP-C [facility] [phone]   To contact Stroke Continuity provider, please refer to [email or web address] After hours, contact General Neurology

## 2021-08-28 NOTE — ED Notes (Signed)
Lama MD at bedside 

## 2021-08-28 NOTE — ED Notes (Signed)
Help get patient straighten up in the bed patient is resting with call bell in reach and family at bedside 

## 2021-08-28 NOTE — Progress Notes (Addendum)
Inpatient Diabetes Program Recommendations  AACE/ADA: New Consensus Statement on Inpatient Glycemic Control (2015)  Target Ranges:  Prepandial:   less than 140 mg/dL      Peak postprandial:   less than 180 mg/dL (1-2 hours)      Critically ill patients:  140 - 180 mg/dL   Lab Results  Component Value Date   GLUCAP 164 (H) 08/28/2021   HGBA1C 13.3 (H) 08/28/2021    Review of Glycemic Control Results for KAYELYN, LEMON (MRN [id number]) as of 08/28/2021 10:16  Ref. Range 08/28/2021 04:28 08/28/2021 07:01 08/28/2021 08:08 08/28/2021 09:25  Glucose-Capillary Latest Ref Range: 70 - 99 mg/dL 192 (H) 123 (H) 134 (H) 164 (H)   Diabetes history: DM 2 Outpatient Diabetes medications:  Lantus 20 units q HS, Metformin 1000 mg bid Current orders for Inpatient glycemic control:  IV insulin  Inpatient Diabetes Program Recommendations:    It appears patient may be ready for transition off insulin drip.  Consider Semglee 20 units daily 2 hours prior to d/c of insulin drip and Novolog sensitive correction q 4 hours. Will see patient regarding A1C and DM.   Thanks,  Adah Perl, RN, BC-ADM Inpatient Diabetes Coordinator Pager [phone]  (8a-5p)  Addendum 1430:  Spoke with RN who states that patient is still on insulin drip.  She is currently NPO due to difficulty swallowing.  Daughter is at bedside.  Will follow- up with patient/daughter on 9/21.  Consider transition off insulin drip.  See recommendations above.

## 2021-08-28 NOTE — Consult Note (Addendum)
Cardiology Consultation:   Patient ID: RANE DUMM MRN: [id number]; DOB: 1946-08-10  Admit date: 08/27/2021 Date of Consult: 08/28/2021  PCP:  Merryl Hacker, No   [facility] Providers Cardiologist:  New (Dr. Debara Pickett)  Patient Profile:   Sonya Duffy is a 75 y.o. female with a history of hypertension, hyperlipidemia, insulin-dependent diabetes mellitus who is being seen for the evaluation of elevated troponin at the request of Dr. Darrick Meigs.  History of Present Illness:   Sonya Duffy is a 75 year old female with the above history. No known cardiac history.   Patient presented to the ED on 08/28/2021 with slurred speech and weakness after being out of insulin for 2 weeks. Patient was found to be in DKA (CBG in the 580s, CO2 of 8, and anion gap of 27) and AKI with creatinine of 2.38. Patient was started on IV fluids with LR and IV insulin infusion. Head CT showed no acute stroke but did show chronic appearing right cerebellar infarct and area of extra-axial increased attenuation within the right parietal region. 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 Head/neck CTA confirmed left paramidline acute/subacute pontine infarct and remote cerebellar infarct and also showed severe stenosis of the right paraclinoid ICA and right ICA terminus with moderate stenosis of the distal right M1 MCA, moderate stenosis of the mid to distal basilar artery and severe stenosis of the non-dominant left intradural vertebral artery distally, small bilateral PCAs with multifocal severe stenosis of the left P2 PCA, and bilateral carotid bifurcation atherosclerosis without >50% stenosis relative to  the distal vessels. Neurology was consulted and felt left pontine subacute stroke likely due to small vessel disease in the setting of uncontrolled risk factors. Cardiology consulted for elevated troponin.    At the time of this evaluation, patient is agitated and uncooperative with nursing staff trying to check a CBG. She was able to tell me her first name but would not answer any other questions. Daughter is at bedside and states she was alert and oriented yesterday. Her altered mental status is new today.   History obtained by daughter: Daughter states patient lives with he brother who has paranoid schizophrenia. Patient herself has some cognitive delay. [place] school. Daughter has been managing patient's finances since she was 25 years old. Daughter  states patient called her on 08/27/2021 and stated that her brother's nurse aid told her she thought she may of had a stroke. Her speech was slurred at the time. Daughter went over to patient's house and said she was profoundly weak and her blood sugar was in the 580s. She immediately called 911. Daughter spoke with some of patient's friends who stated they talked on the phone with her on 08/26/2021 and her speech was normal at that time. Patient also told daughter that she had not eaten anything in 2-4 days and had not been taking her insulin. She states this is because she was depressed because her cat got outside and would not come back. Daughter estimates she actually has not been taking her insulin for 1-2 weeks. Patient has not been complaining of any chest pain or shortness of breath. No syncope. Daughter states patient  did report some diarrhea, nausea/vomiting, increased urination, and decreased energy level over the last couple of days. She has also had some bladder/vaginal pressure. She was seen by a Urologist who recommended possibly seeing OBGYN and prescribed an ointment because she  was "very red"  in that area.  Daughter states patient has no history of tobacco use. She did reportedly drink heavenly when she was younger but has not done this is several years. Daughter states she also had a "wild past" and thinks she likely did marijuana and possibly cocaine when she was younger. But no illicit drug use in the recent past. She does have a strong family history of heart disease on her mother's side of the family with "everyone on that side" having had a heart attack. Patient's mother reportedly had a heart attack in her 59s.  Past Medical History:  Diagnosis Date   Diabetes mellitus    Hypertension     Past Surgical History:  Procedure Laterality Date   CHOLECYSTECTOMY       Home Medications:  Prior to Admission medications   Medication Sig Start Date End Date Taking? Authorizing Provider  insulin glargine (LANTUS) 100 UNIT/ML injection Inject 20 Units into the skin at bedtime.   Yes [provider]  losartan (COZAAR) 50 MG tablet Take 50 mg by mouth daily. 08/17/19  Yes [provider]  metFORMIN (GLUCOPHAGE) 1000 MG tablet Take 1,000 mg by mouth 2 (two) times daily. 08/26/19  Yes [provider]  omeprazole (PRILOSEC) 40 MG capsule Take 40 mg by mouth daily. 07/15/19  Yes [provider]  cephALEXin (KEFLEX) 500 MG capsule Take 1 capsule (500 mg total) by mouth 3 (three) times daily. Patient not taking: No sig reported 09/14/19   Virgel Manifold, MD    Inpatient Medications: Scheduled Meds:  aspirin       [START ON 08/29/2021] aspirin EC  81 mg Oral Daily   atorvastatin  40 mg Oral Daily   clopidogrel  75 mg Oral Daily   cyanocobalamin  1,000 mcg Intramuscular Daily   Followed by   Derrill Memo ON 09/04/2021] vitamin B-12  1,000 mcg Oral Daily   enoxaparin (LOVENOX) injection  40 mg Subcutaneous QHS   Continuous Infusions:  dextrose 5% lactated ringers 125 mL/hr at 08/28/21 1023   insulin 3.8 Units/hr (08/28/21 1134)   lactated ringers Stopped  (08/27/21 2326)   PRN Meds: dextrose  Allergies:   No Known Allergies  Social History:   Social History   Socioeconomic History   Marital status: Divorced    Spouse name: Not on file   Number of children: Not on file   Years of education: Not on file   Highest education level: Not on file  Occupational History   Not on file  Tobacco Use   Smoking status: Never   Smokeless tobacco: Never  Substance and Sexual Activity   Alcohol use: No   Drug use: No   Sexual activity: Not on file  Other Topics Concern   Not on file  Social History Narrative   Not on file   Social Determinants of Health   Financial Resource Strain: Not on file  Food Insecurity: Not on file  Transportation Needs: Not on file  Physical Activity: Not on file  Stress: Not on file  Social Connections: Not on file  Intimate Partner Violence: Not on file    Family History:   Family History  Problem Relation Age of Onset   Heart attack Mother  ROS:  Please see the history of present illness.  Review of Systems  Unable to perform ROS: Mental status change   Physical Exam/Data:   Vitals:   08/28/21 0930 08/28/21 1000 08/28/21 1032 08/28/21 1109  BP: 125/61 (!) 159/81  124/61  Pulse: 88 91  92  Resp: 18 (!) 21  18  Temp:   98.4 F (36.9 C)   TempSrc:   Oral   SpO2: 98% 99%  98%  Weight:      Height:        Intake/Output Summary (Last 24 hours) at 08/28/2021 1606 Last data filed at 08/28/2021 1600 Gross per 24 hour  Intake 411.5 ml  Output --  Net 411.5 ml   Last 3 Weights 08/27/2021  Weight (lbs) 154 lb  Weight (kg) 69.854 kg     Body mass index is 28.17 kg/m.   **Physical exam limited by patient being agitated and uncooperative:**  General: 75 y.o. Caucasian female in no acute distress. Agitated.  HEENT: Normocephalic and atraumatic. Patient would not open eye. Neck: Supple. Unable to assess for bruits or JVD. Heart: RRR. Distinct S1 and S2. No murmurs, gallops, or rubs  appreciated. Lungs: No increased work of breathing. Clear to ausculation bilaterally. No wheezes, rhonchi, or rales appreciated. Abdomen: Soft, non-distended, and non-tender to palpation. Bowel sounds present. Extremities: No lower extremity edema.    Skin: Extremities slightly cool but dry. Neuro: Awake but disoriented. Only able to tell me first name. Very agitated and uncooperative. Psych: Agitated and uncooperative.  EKG:  The EKG was personally reviewed and demonstrates:   - EKG from 08/27/2021: Sinus tachycardia, rate 113 bpm, with PVC, incomplete RBBB, early repolarization changes, and a lot of baseline artifact.  - EKG from 08/28/2021: Normal sinus rhythm, rate 83 bpm, with RBBB, T wave inversions in leads V2-V3, and non-specific T wave flattening in remainder of leads  Telemetry:  Telemetry was personally reviewed and demonstrates: Normal sinus rhythm with rates in the 80s to 90s. Occasional PACs/PVCs.  Relevant CV Studies:  Echo pending.  Laboratory Data:  High Sensitivity Troponin:   Recent Labs  Lab 08/28/21 0247 08/28/21 0451 08/28/21 0753 08/28/21 1231  TROPONINIHS 653* 657* 967* 1,001*     Chemistry Recent Labs  Lab 08/28/21 0247 08/28/21 0753 08/28/21 1231  NA 134* 134* 137  K 4.8 3.7 3.4*  CL 107 105 108  CO2 19* 18* 19*  GLUCOSE 285* 652* 133*  BUN 27* 23 22  CREATININE 1.35* 1.22* 1.43*  CALCIUM 8.8* 8.5* 9.1  GFRNONAA 40* 46* 38*  ANIONGAP 8 11 10     Recent Labs  Lab 08/27/21 1313  PROT 8.0  ALBUMIN 3.0*  AST 14*  ALT 14  ALKPHOS 208*  BILITOT 1.8*   Lipids  Recent Labs  Lab 08/28/21 0815  CHOL 134  TRIG 125  HDL 19*  LDLCALC 90  CHOLHDL 7.1    Hematology Recent Labs  Lab 08/27/21 1313 08/27/21 1336  WBC 15.5*  --   RBC 4.49  --   HGB 12.5 13.9  HCT 40.0 41.0  MCV 89.1  --   MCH 27.8  --   MCHC 31.3  --   RDW 13.7  --   PLT 406*  --    Thyroid  Recent Labs  Lab 08/28/21 0451  TSH 1.372    BNPNo results for  input(s): BNP, PROBNP in the last 168 hours.  DDimer No results for input(s): DDIMER in the last 168 hours.  Radiology/Studies:  CT ANGIO HEAD NECK W WO CM  Result Date: 08/28/2021 CLINICAL DATA:  Stroke, follow up EXAM: CT ANGIOGRAPHY HEAD AND NECK TECHNIQUE: Multidetector CT imaging of the head and neck was performed using the standard protocol during bolus administration of intravenous contrast. Multiplanar CT image reconstructions and MIPs were obtained to evaluate the vascular anatomy. Carotid stenosis measurements (when applicable) are obtained utilizing NASCET criteria, using the distal internal carotid diameter as the denominator. CONTRAST:  35mL OMNIPAQUE IOHEXOL 350 MG/ML SOLN COMPARISON:  Same day MRI.  CT head 08/27/2021. FINDINGS: CT HEAD FINDINGS Brain: Left paramidline pontine acute/subacute infarct, better characterized on recent MRI. No evidence of acute hemorrhage. Mild right cerebellar infarct. Approximately 15 mm extra-axial, dural-based lesion along the right cerebral convexity, compatible with meningioma that was better characterized on recent MRI. Similar size of the ventricular system. No midline shift. No extra-axial fluid collection. Vascular: See below. Skull: No acute fracture. Sinuses: Clear sinuses. Chronic deformity of the nasal bones. No acute orbital findings. Orbits: No mastoid effusions. Review of the MIP images confirms the above findings CTA NECK FINDINGS Aortic arch: Great vessel origins are patent. Right carotid system: Atherosclerosis at the carotid bifurcation without greater than 50% stenosis relative to the distal vessel. The ICA is also mildly narrowed as it courses along the posterior aspect of the hyoid bone in the upper neck with retropharyngeal course. Left carotid system: Atherosclerosis of the carotid bifurcation without greater than 50% stenosis relative to the distal vessel. Vertebral arteries: Right dominant. Bilateral vertebral arteries are patent without  significant pancreatic 50%) stenosis. Skeleton: Multilevel degenerative change. Other neck: No acute findings. Upper chest: Biapical pleural-parenchymal scarring. Otherwise, visualized lung apices are clear. 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 Mild narrowing of the left intracranial ICA. Bilateral M1 and proximal M2 MCA branches are patent. Moderate stenosis of the distal right M1 MCA with otherwise mild multifocal stenosis of the MCAs bilaterally. Early left MCA bifurcation. Bilateral ICAs are patent with small right A1 ACA, likely in part congenital. Posterior circulation: Small/non dominant intradural vertebral artery with superimposed severe stenosis distally. Dominant right vertebral artery is patent without significant stenosis. Moderate (approximately 50%) stenosis of the mid to distal basilar artery, which remains patent. PCAs are small bilaterally with multifocal severe stenosis of the left P2 PCA. Mild multifocal stenosis of the right PCA. Venous sinuses: As permitted by contrast timing, patent. Anatomic variants: See above. Review of the MIP images confirms the above findings IMPRESSION: CT head: 1. Left paramidline acute/subacute pontine infarct better characterized on same-day MRI. 2. Remote right cerebellar infarct. 3. Redemonstrated 15 mm right cerebral convexity meningioma without significant mass effect. CTA head: 1. 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 2. Moderate (approximately 50%) stenosis of the mid to distal basilar artery and severe stenosis of the non-dominant left intradural vertebral artery distally. 3. Small bilateral PCAs with multifocal severe stenosis of the left P2 PCA. CTA neck: Bilateral carotid bifurcation atherosclerosis without greater than 50% stenosis relative to the distal  vessels. Electronically Signed   By: Margaretha Sheffield M.D.   On: 08/28/2021 09:59   CT Head Wo Contrast  Result Date: 08/27/2021 CLINICAL DATA:  Altered mental status. EXAM: CT HEAD WITHOUT CONTRAST TECHNIQUE: Contiguous axial images were obtained from the base of the skull through the vertex without  intravenous contrast. COMPARISON:  None. FINDINGS: Brain: There is mild cerebral atrophy with widening of the extra-axial spaces and ventricular dilatation. There are areas of decreased attenuation within the white matter tracts of the supratentorial brain, consistent with microvascular disease changes. A 2.2 cm x 1.6 cm well-defined, chronic appearing area of low attenuation is seen within the cerebellum on the right. There is no evidence of associated mass effect or midline shift. A 1.2 cm x 0.7 cm x 1.2 cm partially calcified area of extra-axial increased attenuation is seen within the right parietal region (axial CT image 17, CT series 2). Vascular: No hyperdense vessels are identified. Skull: Normal. Negative for fracture or focal lesion. Sinuses/Orbits: No acute finding. Other: The study is limited secondary to patient motion. IMPRESSION: 1. Area of extra-axial increased attenuation within the right parietal region which may represent a partially calcified meningioma. MRI correlation is recommended. 2. Chronic appearing right cerebellar infarct. 3. No acute intracranial abnormality. Electronically Signed   By: Virgina Norfolk M.D.   On: 08/27/2021 15:28   MR BRAIN W WO CONTRAST  Result Date: 08/28/2021 CLINICAL DATA:  75 year old female with delirium and hypertension. Weakness. Recent depression, stopped taking medications including insulin. EXAM: MRI HEAD WITHOUT AND WITH CONTRAST TECHNIQUE: Multiplanar, multiecho pulse sequences of the brain and surrounding structures were obtained without and with intravenous contrast. CONTRAST:  77mL GADAVIST GADOBUTROL 1 MMOL/ML IV SOLN COMPARISON:  Head CT  08/27/2021. FINDINGS: Study is intermittently degraded by motion artifact despite repeated imaging attempts, and no axial T2 weighted imaging could be obtained. Brain: Linear and rounded roughly 14 mm area of abnormal diffusion in the left paracentral pons (series 16, image 73 and series 18, image 58) is mildly restricted on ADC and associated with FLAIR hyperintensity. There is a microhemorrhage there in the left pons (series 22, image 17) which could be acute or chronic. No significant mass effect. There is patchy post ischemic enhancement on series 29, image 22. No other abnormal diffusion. Patchy chronic right superior cerebellar infarct with encephalomalacia. Patchy bilateral cerebral white matter FLAIR hyperintensity. Negative for age deep gray matter nuclei. Lateral and 3rd ventriculomegaly with diminutive cerebral aqueduct and 4th ventricle. No transependymal edema suspected, in the temporal horns remain fairly diminutive. Smooth oval homogeneously enhancing extra-axial mass along the posterior right temporal lobe measures 15 mm diameter, 7 mm in thickness, and there is regional associated dural thickening. No significant mass effect. No regional cerebral edema. No superimposed midline shift. Cervicomedullary junction and pituitary are within normal limits. No other abnormal intracranial enhancement or dural thickening identified. Vascular: Major intracranial vascular flow voids appear preserved on the lone coronal T2 weighted imaging. The major dural venous sinuses are enhancing and appear to be patent. Skull and upper cervical spine: Negative visible cervical spine. Visualized bone marrow signal is within normal limits. Sinuses/Orbits: Negative orbits. Paranasal sinuses and mastoids are stable and well aerated. Other: Grossly normal visible internal auditory structures. IMPRESSION: 1. Subacute infarct of the left paracentral pons. Associated microhemorrhage but no malignant hemorrhagic transformation or  mass effect. 2. Underlying chronic right SCA territory infarct. 3. Small 15 mm meningioma along the right convexity with no mass effect or associated edema. 4. 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 But no acute ventriculomegaly or transependymal edema suspected. Electronically Signed   By: Genevie Ann M.D.   On: 08/28/2021 07:43     Assessment and Plan:   Elevated Troponin  Demand Ischemia Vs NSTEMI - Patient presented with DKA, AKI,  and acute stroke. Found to have elevated troponin. - High-sensitivity troponin elevated at 653 >> 657 >> 967 >> 1,001. Will continue to trend. - EKG shows T wave inversions in V2-V3. New compared to prior tracing in 2020. - No complaints of chest pain but patient currently altered/disoriented. - Will check Echo. - Patient has already been started on Aspirin 81mg  daily. Will start Lipitor 40mg  daily. Will start Lopressor 25mg  twice daily.  - Brain MRI showed some microhemorrhages so would not start IV Heparin.  - Troponin elevation possibly secondary to DKA and stroke. However, can not rule out ischemia. Patient has multiple CV risk factors including HTN, HLD, DM, and family history. However, do not accept ischemic work-up this admission in setting of acute stroke and not chest pain. Can consider outpatient work-up. However, more recommendations pending Echo results.  Subacute/Acute Stroke - Imaging concerning for subacute/acute pontine stroke. Felt to be due to mall vessel disease in the setting of uncontrolled risk factors. Also has significant carotid and vertebral disease on CTA. - Continue aspirin and statin. - Management per Neurology.  Hypertension - BP somewhat labile. - Will state beta-blocker as above given elevated troponin.  Hyperlipidemia - Lipid panel this admission: Total Cholesterol 134, Triglycerides 125, HDL 19, LDL 90.  - LDL <70 given stroke and possible CAD. - Will start Lipitor 40mg   daily.  DKA Insulin-Dependent Diabetes - Patient had not been taking insulin for the past several days to weeks. CBG in the 580s on arrival. - Hemoglobin A1c 13.3. - Management per primary team.  Risk Assessment/Risk Scores:   TIMI Risk Score for Unstable Angina or Non-ST Elevation MI:   The patient's TIMI risk score is 4, which indicates a 20% risk of all cause mortality, new or recurrent myocardial infarction or need for urgent revascularization in the next 14 days.{  For questions or updates, please contact [place] Please consult [email or web address] for contact info under    Signed, Darreld Mclean, PA-C  08/28/2021 4:06 PM

## 2021-08-28 NOTE — Significant Event (Signed)
Patient was complaining of indigestion and heartburn-like sensation for which I ordered troponin came back as 653 with EKG showing nonspecific changes.  Discussed with on-call cardiologist Dr. Dayton Martes who requested trending cardiac markers.  Based on which further plans.  Aspirin has been ordered.  Gean Birchwood

## 2021-08-28 NOTE — Plan of Care (Signed)
MRI brain personally reviewed, agree with radiology: Left pontine subacute stroke. 1. Subacute infarct of the left paracentral pons. Associated microhemorrhage but no malignant hemorrhagic transformation or mass effect.   2. Underlying chronic right SCA territory infarct.   3. Small 15 mm meningioma along the right convexity with no mass effect or associated edema.   4. 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 But no acute ventriculomegaly or transependymal edema suspected.    Additionally notes that primary team has started aspirin secondary to rising troponin (653 with EKG showing nonspecific changes), with patient reporting indigestion/heartburn like sensation  Lab Results  Component Value Date   HGBA1C 13.3 (H) 08/28/2021   Lab Results  Component Value Date   CHOL (H) 03/05/2010    287        ATP III CLASSIFICATION:  <200     mg/dL   Desirable  200-239  mg/dL   Borderline High  >=240    mg/dL   High          HDL 32 (L) 03/05/2010   LDLCALC (H) 03/05/2010    198        Total Cholesterol/HDL:CHD Risk Coronary Heart Disease Risk Table                     Men   Women  1/2 Average Risk   3.4   3.3  Average Risk       5.0   4.4  2 X Average Risk   9.6   7.1  3 X Average Risk  23.4   11.0        Use the calculated Patient Ratio above and the CHD Risk Table to determine the patient's CHD Risk.        ATP III CLASSIFICATION (LDL):  <100     mg/dL   Optimal  100-129  mg/dL   Near or Above                    Optimal  130-159  mg/dL   Borderline  160-189  mg/dL   High  >190     mg/dL   Very High   TRIG 283 (H) 03/05/2010   CHOLHDL 9.0 03/05/2010   B12 low at 147  #Left pontine subacute stroke, likely etiology small vessel disease in the setting of uncontrolled risk factors - Stroke labs HgbA1c demonstrates very poorly controlled diabetes (13.3%), fasting lipid panel pending - Frequent neuro checks - Echocardiogram - Carotid  dopplers - Prophylactic therapy-Antiplatelet med: Aspirin - dose 325mg  PO or 300mg  PR, followed by 81 mg daily -Hold on Plavix for now pending cardiology involvement for possible additional interventions.  If no interventions are needed and anticoagulation is not indicated may look into Basis - Risk factor modification - Telemetry monitoring - Blood pressure goal   - Normotension as patient is more than 2 days out from symptom onset - PT consult, OT consult, Speech consult - Stroke team to follow   #B12 deficiency 1000 mcg IM daily x7 days, followed by 1000 mcg p.o. daily  Appreciate management of potential ACS per primary team/cardiology, as well as management of other comorbidities including resolving DKA  Lesleigh Noe MD-PhD [facility] [phone] Available 7 PM to 7 AM, outside of these hours please call Neurologist on call as listed on Amion.

## 2021-08-28 NOTE — ED Notes (Signed)
Checked patient cbg it was 26 notified RN of blood sugar patient is resting with call bell in reach and family at bedside

## 2021-08-28 NOTE — ED Notes (Signed)
Repositioned pt in bed

## 2021-08-28 NOTE — Evaluation (Signed)
Clinical/Bedside Swallow Evaluation Patient Details  Name: Sonya Duffy MRN: [id number] Date of Birth: Nov 06, 1946  Today's Date: 08/28/2021 Time: SLP Start Time (ACUTE ONLY): 1113 SLP Stop Time (ACUTE ONLY): 1136 SLP Time Calculation (min) (ACUTE ONLY): 23 min  Past Medical History:  Past Medical History:  Diagnosis Date   Diabetes mellitus    Hypertension    Past Surgical History:  Past Surgical History:  Procedure Laterality Date   CHOLECYSTECTOMY     HPI:  Sonya Duffy is a 75 y.o. 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 With past medical history significant for insulin-dependent diabetes and hypertension, intellectual delay/illiteracy. The patient and her daughter notes that the patient's cat has been staying outside of the house recently and refuses to come back inside the house which led the patient to be sad and stopped taking her insulin.  In that setting she began to have intermittent nausea and vomiting as well as diarrhea, generalized weakness, slurred speech that was progressive.    Assessment / Plan / Recommendation  Clinical Impression  Pt demonstrates signifiant AMS in setting of DKA in ED still. Pt will briefly respond but is not fully aware of POs and requires heavy tactile cueing to recognize ice and cup sips. She does not show any immediate signs of aspiration, but only small sips were given due to mental status. With pontine CVA there is potential for decreased sensation of aspiration. She was able to take spoonfuls of puree relatively easily and could likely take meds crushed in puree as needed. Recommend pt remain NPO except for meds in puree until SLP can reassess tomorrow when/if more alert. Daughter and RN aware of plan. SLP Visit Diagnosis: Dysphagia, unspecified (R13.10)    Aspiration Risk  Moderate aspiration risk    Diet Recommendation NPO except meds   Medication Administration: Crushed with puree    Other   Recommendations      Recommendations for follow up therapy are one component of a multi-disciplinary discharge planning process, led by the attending physician.  Recommendations may be updated based on patient status, additional functional criteria and insurance authorization.  Follow up Recommendations 24 hour supervision/assistance      Frequency and Duration min 2x/week  2 weeks       Prognosis Prognosis for Safe Diet Advancement: Good      Swallow Study   General HPI: Sonya Duffy is a 75 y.o. 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 With past medical history significant for insulin-dependent diabetes and hypertension, intellectual delay/illiteracy. The patient and her daughter notes that the patient's cat has been staying outside of the house recently and refuses to come back inside the house which led the patient to be sad and stopped taking her insulin.  In that setting she began to have intermittent nausea and vomiting as well as diarrhea, generalized weakness, slurred speech that was progressive. Type of Study: Bedside Swallow Evaluation Diet Prior to this Study: NPO Temperature Spikes Noted: No Respiratory Status: Room air History of Recent Intubation: No Behavior/Cognition: Lethargic/Drowsy;Doesn't follow directions;Confused Oral Cavity Assessment: Dry Oral Care Completed by SLP: No Oral Cavity - Dentition: Adequate natural dentition Vision: Impaired for self-feeding Self-Feeding Abilities: Total assist Patient Positioning: Upright in bed Baseline Vocal Quality: Not observed Volitional Cough: Cognitively unable to elicit Volitional Swallow: Unable to elicit    Oral/Motor/Sensory Function Overall Oral Motor/Sensory Function: Other (comment) (unabel to follow commands to complete)   [employer]  chips: Within functional limits   Thin Liquid Thin Liquid: Within functional limits Presentation: Cup    Nectar Thick Nectar Thick Liquid: Not  tested   Honey Thick Honey Thick Liquid: Not tested   Puree Puree: Within functional limits   Solid     Solid: Not tested      Sonya Duffy 08/28/2021,11:44 AM

## 2021-08-28 NOTE — ED Notes (Signed)
Called lab to have lipid panel added onto previous collection

## 2021-08-28 NOTE — Progress Notes (Signed)
[facility]  PROGRESS NOTE  Sonya Duffy IEP:[id number] DOB: 1946-01-01 DOA: 08/27/2021 PCP: Pcp, No   Brief HPI:   75 y.o. female, with medical history of diabetes mellitus type 2, hypertension who presented to the ED with complaints of weakness.  As per patient daughter patient's cat and UA a week ago and has been depressed since then.  She stopped taking her regular medication including insulin for cost few days.  Also has been having intermittent nausea and vomiting along with diarrhea.  Patient daughter says that patient became so weak that she was having difficulty getting out of bathtub.  Also developed slurred speech for the past 2 days.  She denies any focal neurological weakness of upper or lower extremities.  No numbness or tingling.   In the ED, patient was found to be in DKA.  CT head was done which showed chronic appearing right cerebellar infarct, area of extra axial increase attenuation within the right parietal region which may represent partially calcified meningioma.  Neurology was consulted.  MRI brain showed subacute infarct of left paracentral pons, associated microhemorrhage but no malignant hemorrhagic transformation or mass-effect.  Patient also developed heartburn last night, found to have elevated troponin.  EKG changes were observed.  Cardiology consulted for further recommendations.    Subjective   Patient is alert, but confused today   Assessment/Plan:     Diabetic ketoacidosis -Resolved, bicarb is up to 19 -CBG has been under 250 throughout the day -We will discontinue IV insulin, start Lantus 20 units subcu daily -Start sliding scale insulin NovoLog -Continue D5 LR at 75 mill per hour since patient is n.p.o.(as per recommendation by speech therapy)  Stroke -MRI brain showed left pontine infarct -Neurology consulted -2D echo is pending -Hemoglobin A1c 13.3 -Neurology recommends aspirin and Plavix for 3 weeks and then continue aspirin  alone  NSTEMI -Patient developed heartburn last night -Troponin elevated at 653; 657; 967; 1001 -Cardiology consulted; echocardiogram is pending -Not a candidate for left heart cath -We will follow cardiology recommendations  Metabolic encephalopathy -Multifactorial, likely from diabetic ketoacidosis, stroke, NSTEMI -We will continue to monitor  Partially calcified meningioma -Seen on CT head and MRI brain -No mass-effect seen, no intervention recommended   Acute kidney injury -Likely from poor p.o. intake -Creatinine was 2.38 on admission -Improved 1.43 with IV fluids -Follow BMP in am  B12 deficiency -B12 was found to be 147 -Started on 1000 mcg IM daily for 7 days followed by 1000 mcg p.o. daily       Data Reviewed:   CBG:  Recent Labs  Lab 08/28/21 1347 08/28/21 1445 08/28/21 1553 08/28/21 1654 08/28/21 1757  GLUCAP 153* 170* 205* 230* 225*    SpO2: 99 %    Vitals:   08/28/21 1032 08/28/21 1109 08/28/21 1659 08/28/21 1704  BP:  124/61  121/66  Pulse:  92 100 98  Resp:  18  (!) 22  Temp: 98.4 F (36.9 C)  99.9 F (37.7 C)   TempSrc: Oral  Axillary   SpO2:  98%  99%  Weight:      Height:         Intake/Output Summary (Last 24 hours) at 08/28/2021 1850 Last data filed at 08/28/2021 1705 Gross per 24 hour  Intake 2377.68 ml  Output --  Net 2377.68 ml    09/18 1901 - 09/20 0700 In: 1100  Out: -   Filed Weights   08/27/21 1557  Weight: 69.9 kg    Data Reviewed:  Basic Metabolic Panel: Recent Labs  Lab 08/27/21 1313 08/27/21 1336 08/27/21 2301 08/28/21 0010 08/28/21 0247 08/28/21 0753 08/28/21 1231  NA 129* 128* 133*  --  134* 134* 137  K 4.1 4.1 5.4* 3.7 4.8 3.7 3.4*  CL 94*  --  104  --  107 105 108  CO2 8*  --  18*  --  19* 18* 19*  GLUCOSE 599*  --  232*  --  285* 652* 133*  BUN 38*  --  30*  --  27* 23 22  CREATININE 2.38*  --  1.52*  --  1.35* 1.22* 1.43*  CALCIUM 9.5  --  8.8*  --  8.8* 8.5* 9.1   Liver Function  Tests: Recent Labs  Lab 08/27/21 1313  AST 14*  ALT 14  ALKPHOS 208*  BILITOT 1.8*  PROT 8.0  ALBUMIN 3.0*   No results for input(s): LIPASE, AMYLASE in the last 168 hours. Recent Labs  Lab 08/28/21 0451  AMMONIA 20   CBC: Recent Labs  Lab 08/27/21 1313 08/27/21 1336  WBC 15.5*  --   NEUTROABS 11.9*  --   HGB 12.5 13.9  HCT 40.0 41.0  MCV 89.1  --   PLT 406*  --    Cardiac Enzymes: No results for input(s): CKTOTAL, CKMB, CKMBINDEX, TROPONINI in the last 168 hours. BNP (last 3 results) No results for input(s): BNP in the last 8760 hours.  ProBNP (last 3 results) No results for input(s): PROBNP in the last 8760 hours.  CBG: Recent Labs  Lab 08/28/21 1347 08/28/21 1445 08/28/21 1553 08/28/21 1654 08/28/21 1757  GLUCAP 153* 170* 205* 230* 225*    Recent Results (from the past 240 hour(s))  Resp Panel by RT-PCR (Flu A&B, Covid) Nasopharyngeal Swab     Status: None   Collection Time: 08/27/21  3:43 PM   Specimen: Nasopharyngeal Swab; Nasopharyngeal(NP) swabs in vial transport medium  Result Value Ref Range Status   SARS Coronavirus 2 by RT PCR NEGATIVE NEGATIVE Final    Comment: (NOTE) SARS-CoV-2 target nucleic acids are NOT DETECTED.  The SARS-CoV-2 RNA is generally detectable in upper respiratory specimens during the acute phase of infection. The lowest concentration of SARS-CoV-2 viral copies this assay can detect is 138 copies/mL. A negative result does not preclude SARS-Cov-2 infection and should not be used as the sole basis for treatment or other patient management decisions. A negative result may occur with  improper specimen collection/handling, submission of specimen other than nasopharyngeal swab, presence of viral mutation(s) within the areas targeted by this assay, and inadequate number of viral copies(<138 copies/mL). A negative result must be combined with clinical observations, patient history, and epidemiological information. The expected  result is Negative.  Fact Sheet for Patients:  EntrepreneurPulse.com.au  Fact Sheet for Healthcare Providers:  IncredibleEmployment.be  This test is no t yet approved or cleared by the [place] FDA and  has been authorized for detection and/or diagnosis of SARS-CoV-2 by FDA under an Emergency Use Authorization (EUA). This EUA will remain  in effect (meaning this test can be used) for the duration of the COVID-19 declaration under Section 564(b)(1) of the Act, 21 U.S.C.section 360bbb-3(b)(1), unless the authorization is terminated  or revoked sooner.       Influenza A by PCR NEGATIVE NEGATIVE Final   Influenza B by PCR NEGATIVE NEGATIVE Final    Comment: (NOTE) The Xpert Xpress SARS-CoV-2/FLU/RSV plus assay is intended as an aid in the diagnosis of influenza from Nasopharyngeal swab  specimens and should not be used as a sole basis for treatment. Nasal washings and aspirates are unacceptable for Xpert Xpress SARS-CoV-2/FLU/RSV testing.  Fact Sheet for Patients: EntrepreneurPulse.com.au  Fact Sheet for Healthcare Providers: IncredibleEmployment.be  This test is not yet approved or cleared by the [place] FDA and has been authorized for detection and/or diagnosis of SARS-CoV-2 by FDA under an Emergency Use Authorization (EUA). This EUA will remain in effect (meaning this test can be used) for the duration of the COVID-19 declaration under Section 564(b)(1) of the Act, 21 U.S.C. section 360bbb-3(b)(1), unless the authorization is terminated or revoked.  Performed at [facility], [place] [address], [place], [place] [address]      Radiology Reports  CT ANGIO HEAD NECK W WO CM  Result Date: 08/28/2021 CLINICAL DATA:  Stroke, follow up EXAM: CT ANGIOGRAPHY HEAD AND NECK TECHNIQUE: Multidetector CT imaging of the head and neck was performed using the standard protocol during bolus  administration of intravenous contrast. Multiplanar CT image reconstructions and MIPs were obtained to evaluate the vascular anatomy. Carotid stenosis measurements (when applicable) are obtained utilizing NASCET criteria, using the distal internal carotid diameter as the denominator. CONTRAST:  41mL OMNIPAQUE IOHEXOL 350 MG/ML SOLN COMPARISON:  Same day MRI.  CT head 08/27/2021. FINDINGS: CT HEAD FINDINGS Brain: Left paramidline pontine acute/subacute infarct, better characterized on recent MRI. No evidence of acute hemorrhage. Mild right cerebellar infarct. Approximately 15 mm extra-axial, dural-based lesion along the right cerebral convexity, compatible with meningioma that was better characterized on recent MRI. Similar size of the ventricular system. No midline shift. No extra-axial fluid collection. Vascular: See below. Skull: No acute fracture. Sinuses: Clear sinuses. Chronic deformity of the nasal bones. No acute orbital findings. Orbits: No mastoid effusions. Review of the MIP images confirms the above findings CTA NECK FINDINGS Aortic arch: Great vessel origins are patent. Right carotid system: Atherosclerosis at the carotid bifurcation without greater than 50% stenosis relative to the distal vessel. The ICA is also mildly narrowed as it courses along the posterior aspect of the hyoid bone in the upper neck with retropharyngeal course. Left carotid system: Atherosclerosis of the carotid bifurcation without greater than 50% stenosis relative to the distal vessel. Vertebral arteries: Right dominant. Bilateral vertebral arteries are patent without significant pancreatic 50%) stenosis. Skeleton: Multilevel degenerative change. Other neck: No acute findings. Upper chest: Biapical pleural-parenchymal scarring. Otherwise, visualized lung apices are clear. 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  Mild narrowing of the left intracranial ICA. Bilateral M1 and proximal M2 MCA branches are patent. Moderate stenosis of the distal right M1 MCA with otherwise mild multifocal stenosis of the MCAs bilaterally. Early left MCA bifurcation. Bilateral ICAs are patent with small right A1 ACA, likely in part congenital. Posterior circulation: Small/non dominant intradural vertebral artery with superimposed severe stenosis distally. Dominant right vertebral artery is patent without significant stenosis. Moderate (approximately 50%) stenosis of the mid to distal basilar artery, which remains patent. PCAs are small bilaterally with multifocal severe stenosis of the left P2 PCA. Mild multifocal stenosis of the right PCA. Venous sinuses: As permitted by contrast timing, patent. Anatomic variants: See above. Review of the MIP images confirms the above findings IMPRESSION: CT head: 1. Left paramidline acute/subacute pontine infarct better characterized on same-day MRI. 2. Remote right cerebellar infarct. 3. Redemonstrated 15 mm right cerebral convexity meningioma without significant mass effect. CTA head:  1. 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 2. Moderate (approximately 50%) stenosis of the mid to distal basilar artery and severe stenosis of the non-dominant left intradural vertebral artery distally. 3. Small bilateral PCAs with multifocal severe stenosis of the left P2 PCA. CTA neck: Bilateral carotid bifurcation atherosclerosis without greater than 50% stenosis relative to the distal vessels. Electronically Signed   By: Margaretha Sheffield M.D.   On: 08/28/2021 09:59   CT Head Wo Contrast  Result Date: 08/27/2021 CLINICAL DATA:  Altered mental status. EXAM: CT HEAD WITHOUT CONTRAST TECHNIQUE: Contiguous axial images were obtained from the base of the skull through the vertex without intravenous contrast. COMPARISON:  None. FINDINGS: Brain:  There is mild cerebral atrophy with widening of the extra-axial spaces and ventricular dilatation. There are areas of decreased attenuation within the white matter tracts of the supratentorial brain, consistent with microvascular disease changes. A 2.2 cm x 1.6 cm well-defined, chronic appearing area of low attenuation is seen within the cerebellum on the right. There is no evidence of associated mass effect or midline shift. A 1.2 cm x 0.7 cm x 1.2 cm partially calcified area of extra-axial increased attenuation is seen within the right parietal region (axial CT image 17, CT series 2). Vascular: No hyperdense vessels are identified. Skull: Normal. Negative for fracture or focal lesion. Sinuses/Orbits: No acute finding. Other: The study is limited secondary to patient motion. IMPRESSION: 1. Area of extra-axial increased attenuation within the right parietal region which may represent a partially calcified meningioma. MRI correlation is recommended. 2. Chronic appearing right cerebellar infarct. 3. No acute intracranial abnormality. Electronically Signed   By: Virgina Norfolk M.D.   On: 08/27/2021 15:28   MR BRAIN W WO CONTRAST  Result Date: 08/28/2021 CLINICAL DATA:  75 year old female with delirium and hypertension. Weakness. Recent depression, stopped taking medications including insulin. EXAM: MRI HEAD WITHOUT AND WITH CONTRAST TECHNIQUE: Multiplanar, multiecho pulse sequences of the brain and surrounding structures were obtained without and with intravenous contrast. CONTRAST:  39mL GADAVIST GADOBUTROL 1 MMOL/ML IV SOLN COMPARISON:  Head CT 08/27/2021. FINDINGS: Study is intermittently degraded by motion artifact despite repeated imaging attempts, and no axial T2 weighted imaging could be obtained. Brain: Linear and rounded roughly 14 mm area of abnormal diffusion in the left paracentral pons (series 16, image 73 and series 18, image 58) is mildly restricted on ADC and associated with FLAIR hyperintensity.  There is a microhemorrhage there in the left pons (series 22, image 17) which could be acute or chronic. No significant mass effect. There is patchy post ischemic enhancement on series 29, image 22. No other abnormal diffusion. Patchy chronic right superior cerebellar infarct with encephalomalacia. Patchy bilateral cerebral white matter FLAIR hyperintensity. Negative for age deep gray matter nuclei. Lateral and 3rd ventriculomegaly with diminutive cerebral aqueduct and 4th ventricle. No transependymal edema suspected, in the temporal horns remain fairly diminutive. Smooth oval homogeneously enhancing extra-axial mass along the posterior right temporal lobe measures 15 mm diameter, 7 mm in thickness, and there is regional associated dural thickening. No significant mass effect. No regional cerebral edema. No superimposed midline shift. Cervicomedullary junction and pituitary are within normal limits. No other abnormal intracranial enhancement or dural thickening identified. Vascular: Major intracranial vascular flow voids appear preserved on the lone coronal T2 weighted imaging. The major dural venous sinuses are enhancing and appear to be patent. Skull and upper cervical spine: Negative visible cervical  spine. Visualized bone marrow signal is within normal limits. Sinuses/Orbits: Negative orbits. Paranasal sinuses and mastoids are stable and well aerated. Other: Grossly normal visible internal auditory structures. IMPRESSION: 1. Subacute infarct of the left paracentral pons. Associated microhemorrhage but no malignant hemorrhagic transformation or mass effect. 2. Underlying chronic right SCA territory infarct. 3. Small 15 mm meningioma along the right convexity with no mass effect or associated edema. 4. 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 But no acute ventriculomegaly or transependymal edema suspected. Electronically Signed   By: Genevie Ann M.D.   On: 08/28/2021 07:43      Scheduled medications:    [START ON 08/29/2021] aspirin EC  325 mg Oral Daily   atorvastatin  40 mg Oral Daily   clopidogrel  75 mg Oral Daily   cyanocobalamin  1,000 mcg Intramuscular Daily   Followed by   Derrill Memo ON 09/04/2021] vitamin B-12  1,000 mcg Oral Daily   enoxaparin (LOVENOX) injection  40 mg Subcutaneous QHS   [START ON 08/29/2021] insulin aspart  0-9 Units Subcutaneous TID WC   insulin glargine-yfgn  20 Units Subcutaneous Daily    Antibiotics: Anti-infectives (From admission, onward)    None         DVT prophylaxis: Lovenox  Code Status: Full code  Family Communication: Discussed with patient's daughter at bedside   Consultants:   Procedures:     Objective    Physical Examination:   General-appears in no acute distress Heart-S1-S2, regular, no murmur auscultated Lungs-clear to auscultation bilaterally, no wheezing or crackles auscultated Abdomen-soft, nontender, no organomegaly Extremities-no edema in the lower extremities Neuro-alert, oriented to self only  Status is: Inpatient  Dispo: The patient is from: Home              Anticipated d/c is to: To be decided              Anticipated d/c date is: 09/01/2021              Patient currently not stable for discharge  Barrier to discharge-ongoing management for stroke, DKA  COVID-19 Labs  No results for input(s): DDIMER, FERRITIN, LDH, CRP in the last 72 hours.  Lab Results  Component Value Date   [place] NEGATIVE 08/27/2021              Oswald Hillock   [facility] If 7PM-7AM, please contact night-coverage at [email or web address], Office  [phone]   08/28/2021, 6:50 PM  LOS: 1 day

## 2021-08-28 NOTE — Significant Event (Signed)
Patient was noted to have spiking fever 101 F.  Will get blood cultures and check chest x-ray.  Tylenol per rectum has been ordered.  Gean Birchwood

## 2021-08-29 ENCOUNTER — Inpatient Hospital Stay (HOSPITAL_COMMUNITY): Payer: Medicare Other

## 2021-08-29 DIAGNOSIS — I6330 Cerebral infarction due to thrombosis of unspecified cerebral artery: Secondary | ICD-10-CM | POA: Diagnosis not present

## 2021-08-29 DIAGNOSIS — E0810 Diabetes mellitus due to underlying condition with ketoacidosis without coma: Secondary | ICD-10-CM | POA: Diagnosis not present

## 2021-08-29 DIAGNOSIS — R509 Fever, unspecified: Secondary | ICD-10-CM | POA: Diagnosis not present

## 2021-08-29 DIAGNOSIS — N179 Acute kidney failure, unspecified: Secondary | ICD-10-CM | POA: Diagnosis not present

## 2021-08-29 DIAGNOSIS — I214 Non-ST elevation (NSTEMI) myocardial infarction: Secondary | ICD-10-CM

## 2021-08-29 DIAGNOSIS — I6389 Other cerebral infarction: Secondary | ICD-10-CM | POA: Diagnosis not present

## 2021-08-29 LAB — BASIC METABOLIC PANEL
Anion gap: 10 (ref 5–15)
Anion gap: 13 (ref 5–15)
BUN: 17 mg/dL (ref 8–23)
BUN: 19 mg/dL (ref 8–23)
CO2: 17 mmol/L — ABNORMAL LOW (ref 22–32)
CO2: 20 mmol/L — ABNORMAL LOW (ref 22–32)
Calcium: 9.5 mg/dL (ref 8.9–10.3)
Calcium: 9.6 mg/dL (ref 8.9–10.3)
Chloride: 108 mmol/L (ref 98–111)
Chloride: 110 mmol/L (ref 98–111)
Creatinine, Ser: 1.45 mg/dL — ABNORMAL HIGH (ref 0.44–1.00)
Creatinine, Ser: 1.48 mg/dL — ABNORMAL HIGH (ref 0.44–1.00)
GFR, Estimated: 36 mL/min — ABNORMAL LOW (ref >60)
GFR, Estimated: 37 mL/min — ABNORMAL LOW (ref >60)
Glucose, Bld: 217 mg/dL — ABNORMAL HIGH (ref 70–99)
Glucose, Bld: 219 mg/dL — ABNORMAL HIGH (ref 70–99)
Potassium: 3.7 mmol/L (ref 3.5–5.1)
Potassium: 4.2 mmol/L (ref 3.5–5.1)
Sodium: 138 mmol/L (ref 135–145)
Sodium: 140 mmol/L (ref 135–145)

## 2021-08-29 LAB — BLOOD CULTURE ID PANEL (REFLEXED) - BCID2

## 2021-08-29 LAB — TROPONIN I (HIGH SENSITIVITY)
Troponin I (High Sensitivity): 802 ng/L (ref <18)
Troponin I (High Sensitivity): 864 ng/L (ref <18)

## 2021-08-29 LAB — GLUCOSE, CAPILLARY
Glucose-Capillary: 267 mg/dL — ABNORMAL HIGH (ref 70–99)
Glucose-Capillary: 280 mg/dL — ABNORMAL HIGH (ref 70–99)
Glucose-Capillary: 252 mg/dL — ABNORMAL HIGH (ref 70–99)
Glucose-Capillary: 287 mg/dL — ABNORMAL HIGH (ref 70–99)
Glucose-Capillary: 255 mg/dL — ABNORMAL HIGH (ref 70–99)

## 2021-08-29 LAB — CK: Total CK: 51 U/L (ref 38–234)

## 2021-08-29 MED ORDER — ESTROGENS CONJUGATED 0.625 MG/GM VA CREA
1.0000 | TOPICAL_CREAM | Freq: Every day | VAGINAL | Status: DC
  Administered 2021-08-29 – 2021-09-04 (×7): 1 via VAGINAL
  Filled 2021-08-29: qty 30

## 2021-08-29 MED ORDER — INSULIN GLARGINE-YFGN 100 UNIT/ML ~~LOC~~ SOLN
25.0000 [IU] | Freq: Every day | SUBCUTANEOUS | Status: DC
  Administered 2021-08-30 – 2021-09-03 (×5): 25 [IU] via SUBCUTANEOUS
  Filled 2021-08-29 (×6): qty 0.25

## 2021-08-29 MED ORDER — ACETAMINOPHEN 325 MG PO TABS
650.0000 mg | ORAL_TABLET | ORAL | Status: DC | PRN
  Administered 2021-08-29 – 2021-09-03 (×9): 650 mg via ORAL
  Filled 2021-08-29 (×9): qty 2

## 2021-08-29 MED ORDER — CEFAZOLIN SODIUM-DEXTROSE 2-4 GM/100ML-% IV SOLN
2.0000 g | Freq: Three times a day (TID) | INTRAVENOUS | Status: DC
  Administered 2021-08-29 – 2021-09-05 (×21): 2 g via INTRAVENOUS
  Filled 2021-08-29 (×21): qty 100

## 2021-08-29 MED ORDER — LIDOCAINE 4 % EX CREA
TOPICAL_CREAM | Freq: Three times a day (TID) | CUTANEOUS | Status: DC | PRN
  Administered 2021-08-29: 1 via TOPICAL
  Filled 2021-08-29: qty 5

## 2021-08-29 MED ORDER — INSULIN ASPART 100 UNIT/ML IJ SOLN
0.0000 [IU] | INTRAMUSCULAR | Status: DC
  Administered 2021-08-29 – 2021-08-30 (×4): 8 [IU] via SUBCUTANEOUS
  Administered 2021-08-30: 3 [IU] via SUBCUTANEOUS
  Administered 2021-08-30: 5 [IU] via SUBCUTANEOUS
  Administered 2021-08-30: 2 [IU] via SUBCUTANEOUS
  Administered 2021-08-30: 5 [IU] via SUBCUTANEOUS

## 2021-08-29 NOTE — Progress Notes (Signed)
Progress Note  Patient Name: Sonya Duffy Date of Encounter: 08/29/2021  [facility] Cardiologist: Pixie Casino, MD (New)  Subjective   Patient developed a fever last night. TMAX 101.4. Temp 100.6 this morning. Chest x-ray showed no acute findings. Blood cultures pending.   Patient just fell asleep before I entered the room. Daughter at bedside and states she actually had moments overnight where she was much more lucid and oriented but this morning was agitated again. She wanted to go home and was complaining of "monsters." Given patient is calm and sleeping at this moment, I did not wake her up. Daughter states when she was lucid/oriented overnight she complained of back pain but no chest pain.   Inpatient Medications    Scheduled Meds:  aspirin EC  325 mg Oral Daily   atorvastatin  40 mg Oral Daily   clopidogrel  75 mg Oral Daily   cyanocobalamin  1,000 mcg Intramuscular Daily   Followed by   Derrill Memo ON 09/04/2021] vitamin B-12  1,000 mcg Oral Daily   enoxaparin (LOVENOX) injection  40 mg Subcutaneous QHS   insulin aspart  0-9 Units Subcutaneous TID WC   insulin glargine-yfgn  20 Units Subcutaneous Daily   metoprolol tartrate  25 mg Oral BID   Continuous Infusions:  lactated ringers 75 mL/hr at 08/29/21 0700   PRN Meds: dextrose   Vital Signs    Vitals:   08/28/21 2307 08/28/21 2314 08/29/21 0337 08/29/21 0347  BP: 128/62 137/68 (!) 143/68 137/74  Pulse:  95 (!) 101 94  Resp:  20 19 14   Temp:  (!) 101.4 F (38.6 C) (!) 100.6 F (38.1 C) (!) 100.6 F (38.1 C)  TempSrc:  Axillary Axillary Axillary  SpO2:  98% 96% 93%  Weight:      Height:        Intake/Output Summary (Last 24 hours) at 08/29/2021 0829 Last data filed at 08/29/2021 0700 Gross per 24 hour  Intake 3052.68 ml  Output --  Net 3052.68 ml   Last 3 Weights 08/27/2021  Weight (lbs) 154 lb  Weight (kg) 69.854 kg      Telemetry    Sinus rhythm with rates in the high 80s to 110s (very brief  spikes in the 120s to 130s). Multiple PACsPVCs noted. - Personally Reviewed  ECG    Sinus tachycardia, raet 105 bpm, with multiple PACs, incomplete RBBB, and T wave inversions in V1-V3. - Personally Reviewed  Physical Exam   GEN: No acute distress.   Neck: No JVD. Cardiac: Mild tachycardic with irregular rhythm due to ectopy. No murmurs, rubs, or gallops.  Respiratory: Clear to auscultation anteriorly. No wheezes, rhonchi, or rales. MS: No lower extremity edema. No deformity. Neuro:  No focal deficits. Psych: Sleeping.  Labs    High Sensitivity Troponin:   Recent Labs  Lab 08/28/21 0451 08/28/21 0753 08/28/21 1231 08/28/21 2341 08/29/21 0035  TROPONINIHS 657* 967* 1,001* 864* 802*     Chemistry Recent Labs  Lab 08/27/21 1313 08/27/21 1336 08/28/21 1919 08/28/21 2341 08/29/21 0035  NA 129*   < > 141 140 138  K 4.1   < > 3.4* 4.2 3.7  CL 94*   < > 112* 110 108  CO2 8*   < > 20* 20* 17*  GLUCOSE 599*   < > 158* 219* 217*  BUN 38*   < > 18 19 17   CREATININE 2.38*   < > 1.44* 1.45* 1.48*  CALCIUM 9.5   < >  9.6 9.6 9.5  PROT 8.0  --   --   --   --   ALBUMIN 3.0*  --   --   --   --   AST 14*  --   --   --   --   ALT 14  --   --   --   --   ALKPHOS 208*  --   --   --   --   BILITOT 1.8*  --   --   --   --   GFRNONAA 20*   < > 37* 37* 36*  ANIONGAP 27*   < > 9 10 13    < > = values in this interval not displayed.    Lipids  Recent Labs  Lab 08/28/21 0815  CHOL 134  TRIG 125  HDL 19*  LDLCALC 90  CHOLHDL 7.1    Hematology Recent Labs  Lab 08/27/21 1313 08/27/21 1336  WBC 15.5*  --   RBC 4.49  --   HGB 12.5 13.9  HCT 40.0 41.0  MCV 89.1  --   MCH 27.8  --   MCHC 31.3  --   RDW 13.7  --   PLT 406*  --    Thyroid  Recent Labs  Lab 08/28/21 0451  TSH 1.372    BNPNo results for input(s): BNP, PROBNP in the last 168 hours.  DDimer No results for input(s): DDIMER in the last 168 hours.   Radiology    CT ANGIO HEAD NECK W WO CM  Result Date:  08/28/2021 CLINICAL DATA:  Stroke, follow up EXAM: CT ANGIOGRAPHY HEAD AND NECK TECHNIQUE: Multidetector CT imaging of the head and neck was performed using the standard protocol during bolus administration of intravenous contrast. Multiplanar CT image reconstructions and MIPs were obtained to evaluate the vascular anatomy. Carotid stenosis measurements (when applicable) are obtained utilizing NASCET criteria, using the distal internal carotid diameter as the denominator. CONTRAST:  1mL OMNIPAQUE IOHEXOL 350 MG/ML SOLN COMPARISON:  Same day MRI.  CT head 08/27/2021. FINDINGS: CT HEAD FINDINGS Brain: Left paramidline pontine acute/subacute infarct, better characterized on recent MRI. No evidence of acute hemorrhage. Mild right cerebellar infarct. Approximately 15 mm extra-axial, dural-based lesion along the right cerebral convexity, compatible with meningioma that was better characterized on recent MRI. Similar size of the ventricular system. No midline shift. No extra-axial fluid collection. Vascular: See below. Skull: No acute fracture. Sinuses: Clear sinuses. Chronic deformity of the nasal bones. No acute orbital findings. Orbits: No mastoid effusions. Review of the MIP images confirms the above findings CTA NECK FINDINGS Aortic arch: Great vessel origins are patent. Right carotid system: Atherosclerosis at the carotid bifurcation without greater than 50% stenosis relative to the distal vessel. The ICA is also mildly narrowed as it courses along the posterior aspect of the hyoid bone in the upper neck with retropharyngeal course. Left carotid system: Atherosclerosis of the carotid bifurcation without greater than 50% stenosis relative to the distal vessel. Vertebral arteries: Right dominant. Bilateral vertebral arteries are patent without significant pancreatic 50%) stenosis. Skeleton: Multilevel degenerative change. Other neck: No acute findings. Upper chest: Biapical pleural-parenchymal scarring. Otherwise,  visualized lung apices are clear. 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 Mild narrowing of the left intracranial ICA. Bilateral M1 and proximal M2 MCA branches are patent. Moderate stenosis of the distal right M1 MCA with otherwise mild multifocal  stenosis of the MCAs bilaterally. Early left MCA bifurcation. Bilateral ICAs are patent with small right A1 ACA, likely in part congenital. Posterior circulation: Small/non dominant intradural vertebral artery with superimposed severe stenosis distally. Dominant right vertebral artery is patent without significant stenosis. Moderate (approximately 50%) stenosis of the mid to distal basilar artery, which remains patent. PCAs are small bilaterally with multifocal severe stenosis of the left P2 PCA. Mild multifocal stenosis of the right PCA. Venous sinuses: As permitted by contrast timing, patent. Anatomic variants: See above. Review of the MIP images confirms the above findings IMPRESSION: CT head: 1. Left paramidline acute/subacute pontine infarct better characterized on same-day MRI. 2. Remote right cerebellar infarct. 3. Redemonstrated 15 mm right cerebral convexity meningioma without significant mass effect. CTA head: 1. 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 2. Moderate (approximately 50%) stenosis of the mid to distal basilar artery and severe stenosis of the non-dominant left intradural vertebral artery distally. 3. Small bilateral PCAs with multifocal severe stenosis of the left P2 PCA. CTA neck: Bilateral carotid bifurcation atherosclerosis without greater than 50% stenosis relative to the distal vessels. Electronically Signed   By: Margaretha Sheffield M.D.   On: 08/28/2021 09:59   CT Head Wo Contrast  Result Date: 08/27/2021 CLINICAL DATA:  Altered mental status.  EXAM: CT HEAD WITHOUT CONTRAST TECHNIQUE: Contiguous axial images were obtained from the base of the skull through the vertex without intravenous contrast. COMPARISON:  None. FINDINGS: Brain: There is mild cerebral atrophy with widening of the extra-axial spaces and ventricular dilatation. There are areas of decreased attenuation within the white matter tracts of the supratentorial brain, consistent with microvascular disease changes. A 2.2 cm x 1.6 cm well-defined, chronic appearing area of low attenuation is seen within the cerebellum on the right. There is no evidence of associated mass effect or midline shift. A 1.2 cm x 0.7 cm x 1.2 cm partially calcified area of extra-axial increased attenuation is seen within the right parietal region (axial CT image 17, CT series 2). Vascular: No hyperdense vessels are identified. Skull: Normal. Negative for fracture or focal lesion. Sinuses/Orbits: No acute finding. Other: The study is limited secondary to patient motion. IMPRESSION: 1. Area of extra-axial increased attenuation within the right parietal region which may represent a partially calcified meningioma. MRI correlation is recommended. 2. Chronic appearing right cerebellar infarct. 3. No acute intracranial abnormality. Electronically Signed   By: Virgina Norfolk M.D.   On: 08/27/2021 15:28   MR BRAIN W WO CONTRAST  Result Date: 08/28/2021 CLINICAL DATA:  75 year old female with delirium and hypertension. Weakness. Recent depression, stopped taking medications including insulin. EXAM: MRI HEAD WITHOUT AND WITH CONTRAST TECHNIQUE: Multiplanar, multiecho pulse sequences of the brain and surrounding structures were obtained without and with intravenous contrast. CONTRAST:  48mL GADAVIST GADOBUTROL 1 MMOL/ML IV SOLN COMPARISON:  Head CT 08/27/2021. FINDINGS: Study is intermittently degraded by motion artifact despite repeated imaging attempts, and no axial T2 weighted imaging could be obtained. Brain: Linear and  rounded roughly 14 mm area of abnormal diffusion in the left paracentral pons (series 16, image 73 and series 18, image 58) is mildly restricted on ADC and associated with FLAIR hyperintensity. There is a microhemorrhage there in the left pons (series 22, image 17) which could be acute or chronic. No significant mass effect. There is patchy post ischemic enhancement on series 29, image 22. No other abnormal diffusion. Patchy chronic right superior  cerebellar infarct with encephalomalacia. Patchy bilateral cerebral white matter FLAIR hyperintensity. Negative for age deep gray matter nuclei. Lateral and 3rd ventriculomegaly with diminutive cerebral aqueduct and 4th ventricle. No transependymal edema suspected, in the temporal horns remain fairly diminutive. Smooth oval homogeneously enhancing extra-axial mass along the posterior right temporal lobe measures 15 mm diameter, 7 mm in thickness, and there is regional associated dural thickening. No significant mass effect. No regional cerebral edema. No superimposed midline shift. Cervicomedullary junction and pituitary are within normal limits. No other abnormal intracranial enhancement or dural thickening identified. Vascular: Major intracranial vascular flow voids appear preserved on the lone coronal T2 weighted imaging. The major dural venous sinuses are enhancing and appear to be patent. Skull and upper cervical spine: Negative visible cervical spine. Visualized bone marrow signal is within normal limits. Sinuses/Orbits: Negative orbits. Paranasal sinuses and mastoids are stable and well aerated. Other: Grossly normal visible internal auditory structures. IMPRESSION: 1. Subacute infarct of the left paracentral pons. Associated microhemorrhage but no malignant hemorrhagic transformation or mass effect. 2. Underlying chronic right SCA territory infarct. 3. Small 15 mm meningioma along the right convexity with no mass effect or associated edema. 4. Appearance suggestive  of chronic or congenital cerebral aqueduct stenosis. But no acute ventriculomegaly or transependymal edema suspected. Electronically Signed   By: Genevie Ann M.D.   On: 08/28/2021 07:43   DG CHEST PORT 1 VIEW  Result Date: 08/28/2021 CLINICAL DATA:  Fever. EXAM: PORTABLE CHEST 1 VIEW COMPARISON:  Chest x-ray 07/03/2012. FINDINGS: The left costophrenic angle has been excluded from the examination. There is no focal lung consolidation, pleural effusion or pneumothorax. The cardiomediastinal silhouette is within normal limits. No acute fractures are seen. There is a right shoulder arthroplasty. IMPRESSION: No active disease. Electronically Signed   By: Ronney Asters M.D.   On: 08/28/2021 20:26    Cardiac Studies   Echo pending.  Patient Profile     75 y.o. female history of hypertension, hyperlipidemia, insulin-dependent diabetes mellitus who was admitted on 08/27/2021 with DKA, subacute/acute stroke, and AKI. Cardiology consulted for evaluation of elevated troponin at the request of Dr. Darrick Meigs.  Assessment & Plan    Elevated Troponin  Demand Ischemia Vs NSTEMI - Patient presented with DKA, AKI, and acute stroke. Found to have elevated troponin. - High-sensitivity troponin peaked at 1,001 and now down-trending.  - EKG shows T wave inversions in V2-V3. New compared to prior tracing in 2020. - No complaints of chest pain. - Echo pending.  - Started on DAPT with Aspirin and Plavix per Neurology. Plan is for DAPT for 3 months and then Aspirin alone. Also started on Lopressor 25mg  twice daily and Lipitor 40mg  daily. Of note, meds were held last night due to altered mental status and unable to take oral medications. Give when able. - Brain MRI showed some microhemorrhages so would not start IV Heparin.  - Troponin elevation possibly secondary to DKA and stroke. However, can not rule out ischemia. Patient has multiple CV risk factors including HTN, HLD, DM, and family history. However, do not accept ischemic  work-up this admission in setting of acute stroke and not chest pain. Can consider outpatient work-up. However, more recommendations pending Echo results.   Subacute/Acute Stroke - Imaging concerning for subacute/acute pontine stroke. Felt to be due to mall vessel disease in the setting of uncontrolled risk factors. Also has significant carotid and vertebral disease on CTA. - Continue aspirin and statin. - Management per Neurology.   Hypertension - BP  somewhat labile. Well controlled this morning. - Continue low dose beta-blocker as above.   Hyperlipidemia - Lipid panel this admission: Total Cholesterol 134, Triglycerides 125, HDL 19, LDL 90.  - LDL <70 given stroke and possible CAD. - Will start Lipitor 40mg  daily.   DKA Insulin-Dependent Diabetes - Patient had not been taking insulin for the past several days to weeks. CBG in the 580s on arrival. - Hemoglobin A1c 13.3. - Management per primary team.  AKI - Creatinine 2.38 on admission. Baseline was 1.17 in 2020.  - Likely due to dehydration in setting of poor oral intake and DKA. - Improved with fluids. Creatinine 1.48 today. - Continue to monitor.  Fever - TMAX of 101.4 last night. - Chest x-ray showed no acute findings.  - Blood cultures pending.  - Management per primary team.  For questions or updates, please contact [place] Please consult [email or web address] for contact info under        Signed, Darreld Mclean, PA-C  08/29/2021, 8:29 AM

## 2021-08-29 NOTE — Progress Notes (Signed)
Inpatient Diabetes Program Recommendations  AACE/ADA: New Consensus Statement on Inpatient Glycemic Control (2015)  Target Ranges:  Prepandial:   less than 140 mg/dL      Peak postprandial:   less than 180 mg/dL (1-2 hours)      Critically ill patients:  140 - 180 mg/dL   Lab Results  Component Value Date   GLUCAP 252 (H) 08/29/2021   HGBA1C 13.3 (H) 08/28/2021    Review of Glycemic Control Results for DELTHA, BERNALES (MRN [id number]) as of 08/29/2021 16:19  Ref. Range 08/28/2021 20:38 08/28/2021 22:59 08/29/2021 03:19 08/29/2021 08:47 08/29/2021 12:17  Glucose-Capillary Latest Ref Range: 70 - 99 mg/dL 125 (H) 176 (H) 267 (H) 280 (H) 252 (H)   Diabetes history: DM 2 Outpatient Diabetes medications:  Metformin 1000 mg bid, Lantus 20 units daily Current orders for Inpatient glycemic control:  Novolog moderate q 4 hours, Lantus 20 units daily  Inpatient Diabetes Program Recommendations:    Spoke with patient and daughter.  Prior to admit patient was taking her own insulin however she stopped b/c her cat ran away and she was sad.   Discussed importance of taking insulin as prescribed.  Daughter does not live with patient but checks on her often.  Daughter wants to know if patient can take her basal insulin during the day so that she can make sure that she is taking it as prescribed.  I explained that this should be fine as long as doses are 24 hours apart.  She is also interested in her mother getting a CGM b/c she states that she does not check her blood sugars.  Gave daughter information on [place] and will continue to follow regarding appropriateness at d/c.  Also discussed diet and gave handouts on A1C and dietary modifications.  Daughter appreciative of information.  She states that her mother does well when she is at her house but when at home, she likes to eat lots of Fig Newtons.  Unsure of where patient will d/c at this point, but will follow.  Consider increasing Semglee to 25  units daily.    Thanks,  Adah Perl, RN, BC-ADM Inpatient Diabetes Coordinator Pager [phone]   (8a-5p)

## 2021-08-29 NOTE — Progress Notes (Addendum)
PHARMACY - PHYSICIAN COMMUNICATION CRITICAL VALUE ALERT - BLOOD CULTURE IDENTIFICATION (BCID)  Sonya Duffy is an 75 y.o. female who presented to [facility] with a chief complaint of DKA.  Assessment:  1/3 GPC in clusters identified as staph aureus, no resistance detected   Name of physician (or Provider) Contacted: Dr. Eulogio Bear   Current antibiotics: No current antibiotics.   Changes to prescribed antibiotics recommended:  Start cefazolin 2g q8h and monitor renal function for need to adjust to cefazolin 2g q12h  No results found for this or any previous visit.  Cristela Felt, PharmD, BCPS Clinical Pharmacist 08/29/2021 6:35 PM

## 2021-08-29 NOTE — Progress Notes (Signed)
Speech Language Pathology Treatment: Dysphagia  Patient Details Name: Sonya Duffy MRN: [id number] DOB: 10-Mar-1946 Today's Date: 08/29/2021 Time: 7096-2836 SLP Time Calculation (min) (ACUTE ONLY): 14 min  Assessment / Plan / Recommendation Clinical Impression  Pt was seen for dysphagia treatment with her daughter present. Pt was alert and cooperative during the session. Pt's daughter reported that confusion and imprecise speech persist, but that they are notably improved compared to yesterday. Pt demonstrated prolonged bolus manipulation and mastication of regular texture solids and mild to moderate oral residue was noted. Pt's oral phase was more functional with dysphagia 3 solids with adequate oral clearance and mastication. No s/sx of aspiration were noted with solids or with consecutive swallows of thin liquids via straw. A dysphagia 3 diet with thin liquids is recommended at this time. SLP will continue to follow pt.    HPI HPI: Sonya Duffy is a 75 y.o. 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 With past medical history significant for insulin-dependent diabetes and hypertension, intellectual delay/illiteracy. The patient and her daughter notes that the patient's cat has been staying outside of the house recently and refuses to come back inside the house which led the patient to be sad and stopped taking her insulin.  In that setting she began to have intermittent nausea and vomiting as well as diarrhea, generalized weakness, slurred speech that was progressive.      SLP Plan  Continue with current plan of care      Recommendations for follow up therapy are one component of a multi-disciplinary discharge planning process, led by the attending physician.  Recommendations may be updated based on patient status, additional functional criteria and insurance authorization.    Recommendations  Diet recommendations: Dysphagia 3 (mechanical soft);Thin  liquid Liquids provided via: Cup;Straw Medication Administration: Whole meds with puree (whole with puree or with thin liquids as tolerated) Supervision: Staff to assist with self feeding Compensations: Small sips/bites Postural Changes and/or Swallow Maneuvers: Seated upright 90 degrees                Follow up Recommendations: 24 hour supervision/assistance SLP Visit Diagnosis: Dysphagia, unspecified (R13.10) Plan: Continue with current plan of care       Milca Sytsma I. Hardin Negus, [place], [place] Office number [phone] Pager [place]  08/29/2021, 2:31 PM

## 2021-08-29 NOTE — Progress Notes (Signed)
Progress Note    Sonya Duffy  ZYY:[id number] DOB: 04/15/1946  DOA: 08/27/2021 PCP: Pcp, No    Brief Narrative:     Medical records reviewed and are as summarized below:  Sonya Duffy is an 75 y.o. female patient was found to be in DKA.  CT head was done which showed chronic appearing right cerebellar infarct, area of extra axial increase attenuation within the right parietal region which may represent partially calcified meningioma.  Neurology was consulted.  MRI brain showed subacute infarct of left paracentral pons, associated microhemorrhage but no malignant hemorrhagic transformation or mass-effect.   Assessment/Plan:   Principal Problem:   DKA (diabetic ketoacidosis) (HCC) Active Problems:   Cerebral thrombosis with cerebral infarction   Non-ST elevation (NSTEMI) myocardial infarction (HCC)   Fever -unclear source -U/A negative X ray negative Blood cultures pending -hold on abx  Diabetic ketoacidosis -Resolved -CBG has been under 250 throughout the day -Lantus 20 units subcu daily -Start sliding scale insulin NovoLog   Acute Stroke -MRI brain showed left pontine infarct -Neurology consulted -2D echo: pending -Hemoglobin A1c 13.3 -Neurology recommends aspirin and Plavix for 3 weeks and then continue aspirin alone  - SLP  pending  NSTEMI -Troponin elevated at 653; 657; 967; 1001 -Cardiology consulted -Not a candidate for left heart cath -We will follow cardiology recommendations   Metabolic encephalopathy -Multifactorial, likely from diabetic ketoacidosis, stroke, NSTEMI -We will continue to monitor   Partially calcified meningioma -Seen on CT head and MRI brain -No mass-effect seen, no intervention recommended   Acute kidney injury -Likely from poor p.o. intake -Creatinine was 2.38 on admission -Improved with IVF   B12 deficiency -B12 was found to be 147 -Started on 1000 mcg IM daily for 7 days followed by 1000 mcg p.o. daily        Family Communication/Anticipated D/C date and plan/Code Status   DVT prophylaxis: Lovenox ordered. Code Status: Full Code.  Family Communication: at bedside Disposition Plan: Status is: Inpatient  Remains inpatient appropriate because:Inpatient level of care appropriate due to severity of illness  Dispo: The patient is from: Home              Anticipated d/c is to:  tbd              Patient currently is not medically stable to d/c.   Difficult to place patient No         Medical Consultants:   Neuro cards  Subjective:   Daughter says she is improved but sleepy this AM  Objective:    Vitals:   08/29/21 0337 08/29/21 0347 08/29/21 0841 08/29/21 1100  BP: (!) 143/68 137/74 (!) 150/82 126/70  Pulse: (!) 101 94 97   Resp: 19 14 18    Temp: (!) 100.6 F (38.1 C) (!) 100.6 F (38.1 C) 100 F (37.8 C) 99.1 F (37.3 C)  TempSrc: Axillary Axillary Axillary Axillary  SpO2: 96% 93% 97% 98%  Weight:      Height:        Intake/Output Summary (Last 24 hours) at 08/29/2021 1250 Last data filed at 08/29/2021 0700 Gross per 24 hour  Intake 2949.14 ml  Output --  Net 2949.14 ml   Filed Weights   08/27/21 1557  Weight: 69.9 kg    Exam:  General: Appearance:     Overweight female in no acute distress     Lungs:     respirations unlabored  Heart:    Normal heart rate.  MS:   All extremities are intact.    Neurologic:   sleeping     Data Reviewed:   I have personally reviewed following labs and imaging studies:  Labs: Labs show the following:   Basic Metabolic Panel: Recent Labs  Lab 08/28/21 0753 08/28/21 1231 08/28/21 1919 08/28/21 2341 08/29/21 0035  NA 134* 137 141 140 138  K 3.7 3.4* 3.4* 4.2 3.7  CL 105 108 112* 110 108  CO2 18* 19* 20* 20* 17*  GLUCOSE 652* 133* 158* 219* 217*  BUN 23 22 18 19 17   CREATININE 1.22* 1.43* 1.44* 1.45* 1.48*  CALCIUM 8.5* 9.1 9.6 9.6 9.5   GFR Estimated Creatinine Clearance: 29.1 mL/min (A) (by C-G  formula based on SCr of 1.48 mg/dL (H)). Liver Function Tests: Recent Labs  Lab 08/27/21 1313  AST 14*  ALT 14  ALKPHOS 208*  BILITOT 1.8*  PROT 8.0  ALBUMIN 3.0*   No results for input(s): LIPASE, AMYLASE in the last 168 hours. Recent Labs  Lab 08/28/21 0451  AMMONIA 20   Coagulation profile Recent Labs  Lab 08/27/21 1313  INR 1.2    CBC: Recent Labs  Lab 08/27/21 1313 08/27/21 1336  WBC 15.5*  --   NEUTROABS 11.9*  --   HGB 12.5 13.9  HCT 40.0 41.0  MCV 89.1  --   PLT 406*  --    Cardiac Enzymes: No results for input(s): CKTOTAL, CKMB, CKMBINDEX, TROPONINI in the last 168 hours. BNP (last 3 results) No results for input(s): PROBNP in the last 8760 hours. CBG: Recent Labs  Lab 08/28/21 2038 08/28/21 2259 08/29/21 0319 08/29/21 0847 08/29/21 1217  GLUCAP 125* 176* 267* 280* 252*   D-Dimer: No results for input(s): DDIMER in the last 72 hours. Hgb A1c: Recent Labs    08/28/21 0010  HGBA1C 13.3*   Lipid Profile: Recent Labs    08/28/21 0815  CHOL 134  HDL 19*  LDLCALC 90  TRIG 125  CHOLHDL 7.1   Thyroid function studies: Recent Labs    08/28/21 0451  TSH 1.372   Anemia work up: Recent Labs    08/28/21 0451  VITAMINB12 147*   Sepsis Labs: Recent Labs  Lab 08/27/21 1313  WBC 15.5*    Microbiology Recent Results (from the past 240 hour(s))  Resp Panel by RT-PCR (Flu A&B, Covid) Nasopharyngeal Swab     Status: None   Collection Time: 08/27/21  3:43 PM   Specimen: Nasopharyngeal Swab; Nasopharyngeal(NP) swabs in vial transport medium  Result Value Ref Range Status   SARS Coronavirus 2 by RT PCR NEGATIVE NEGATIVE Final    Comment: (NOTE) SARS-CoV-2 target nucleic acids are NOT DETECTED.  The SARS-CoV-2 RNA is generally detectable in upper respiratory specimens during the acute phase of infection. The lowest concentration of SARS-CoV-2 viral copies this assay can detect is 138 copies/mL. A negative result does not preclude  SARS-Cov-2 infection and should not be used as the sole basis for treatment or other patient management decisions. A negative result may occur with  improper specimen collection/handling, submission of specimen other than nasopharyngeal swab, presence of viral mutation(s) within the areas targeted by this assay, and inadequate number of viral copies(<138 copies/mL). A negative result must be combined with clinical observations, patient history, and epidemiological information. The expected result is Negative.  Fact Sheet for Patients:  EntrepreneurPulse.com.au  Fact Sheet for Healthcare Providers:  IncredibleEmployment.be  This test is no t yet approved or cleared by the [place] FDA  and  has been authorized for detection and/or diagnosis of SARS-CoV-2 by FDA under an Emergency Use Authorization (EUA). This EUA will remain  in effect (meaning this test can be used) for the duration of the COVID-19 declaration under Section 564(b)(1) of the Act, 21 U.S.C.section 360bbb-3(b)(1), unless the authorization is terminated  or revoked sooner.       Influenza A by PCR NEGATIVE NEGATIVE Final   Influenza B by PCR NEGATIVE NEGATIVE Final    Comment: (NOTE) The Xpert Xpress SARS-CoV-2/FLU/RSV plus assay is intended as an aid in the diagnosis of influenza from Nasopharyngeal swab specimens and should not be used as a sole basis for treatment. Nasal washings and aspirates are unacceptable for Xpert Xpress SARS-CoV-2/FLU/RSV testing.  Fact Sheet for Patients: EntrepreneurPulse.com.au  Fact Sheet for Healthcare Providers: IncredibleEmployment.be  This test is not yet approved or cleared by the [place] FDA and has been authorized for detection and/or diagnosis of SARS-CoV-2 by FDA under an Emergency Use Authorization (EUA). This EUA will remain in effect (meaning this test can be used) for the duration of  the COVID-19 declaration under Section 564(b)(1) of the Act, 21 U.S.C. section 360bbb-3(b)(1), unless the authorization is terminated or revoked.  Performed at [facility], [place] [address] [place], [place] [address]   Culture, blood (Routine X 2) w Reflex to ID Panel     Status: None (Preliminary result)   Collection Time: 08/28/21 11:41 PM   Specimen: BLOOD  Result Value Ref Range Status   Specimen Description BLOOD LEFT ANTECUBITAL  Final   Special Requests AEROBIC BOTTLE ONLY Blood Culture adequate volume  Final   Culture   Final    NO GROWTH < 12 HOURS Performed at [facility], [place] [address] [place], [place] [address]    Report Status PENDING  Incomplete  Culture, blood (Routine X 2) w Reflex to ID Panel     Status: None (Preliminary result)   Collection Time: 08/28/21 11:42 PM   Specimen: BLOOD LEFT FOREARM  Result Value Ref Range Status   Specimen Description BLOOD LEFT FOREARM  Final   Special Requests   Final    BOTTLES DRAWN AEROBIC AND ANAEROBIC Blood Culture adequate volume   Culture   Final    NO GROWTH < 12 HOURS Performed at [facility], [place] [address], [place], [place] [address]    Report Status PENDING  Incomplete    Procedures and diagnostic studies:  CT ANGIO HEAD NECK W WO CM  Result Date: 08/28/2021 CLINICAL DATA:  Stroke, follow up EXAM: CT ANGIOGRAPHY HEAD AND NECK TECHNIQUE: Multidetector CT imaging of the head and neck was performed using the standard protocol during bolus administration of intravenous contrast. Multiplanar CT image reconstructions and MIPs were obtained to evaluate the vascular anatomy. Carotid stenosis measurements (when applicable) are obtained utilizing NASCET criteria, using the distal internal carotid diameter as the denominator. CONTRAST:  26mL OMNIPAQUE IOHEXOL 350 MG/ML SOLN COMPARISON:  Same day MRI.  CT head 08/27/2021. FINDINGS: CT HEAD FINDINGS Brain: Left paramidline pontine acute/subacute infarct,  better characterized on recent MRI. No evidence of acute hemorrhage. Mild right cerebellar infarct. Approximately 15 mm extra-axial, dural-based lesion along the right cerebral convexity, compatible with meningioma that was better characterized on recent MRI. Similar size of the ventricular system. No midline shift. No extra-axial fluid collection. Vascular: See below. Skull: No acute fracture. Sinuses: Clear sinuses. Chronic deformity of the nasal bones. No acute orbital findings. Orbits: No mastoid  effusions. Review of the MIP images confirms the above findings CTA NECK FINDINGS Aortic arch: Great vessel origins are patent. Right carotid system: Atherosclerosis at the carotid bifurcation without greater than 50% stenosis relative to the distal vessel. The ICA is also mildly narrowed as it courses along the posterior aspect of the hyoid bone in the upper neck with retropharyngeal course. Left carotid system: Atherosclerosis of the carotid bifurcation without greater than 50% stenosis relative to the distal vessel. Vertebral arteries: Right dominant. Bilateral vertebral arteries are patent without significant pancreatic 50%) stenosis. Skeleton: Multilevel degenerative change. Other neck: No acute findings. Upper chest: Biapical pleural-parenchymal scarring. Otherwise, visualized lung apices are clear. 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 Mild narrowing of the left intracranial ICA. Bilateral M1 and proximal M2 MCA branches are patent. Moderate stenosis of the distal right M1 MCA with otherwise mild multifocal stenosis of the MCAs bilaterally. Early left MCA bifurcation. Bilateral ICAs are patent with small right A1 ACA, likely in part congenital. Posterior circulation: Small/non dominant intradural vertebral artery with superimposed severe stenosis distally. Dominant right vertebral artery is  patent without significant stenosis. Moderate (approximately 50%) stenosis of the mid to distal basilar artery, which remains patent. PCAs are small bilaterally with multifocal severe stenosis of the left P2 PCA. Mild multifocal stenosis of the right PCA. Venous sinuses: As permitted by contrast timing, patent. Anatomic variants: See above. Review of the MIP images confirms the above findings IMPRESSION: CT head: 1. Left paramidline acute/subacute pontine infarct better characterized on same-day MRI. 2. Remote right cerebellar infarct. 3. Redemonstrated 15 mm right cerebral convexity meningioma without significant mass effect. CTA head: 1. 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 2. Moderate (approximately 50%) stenosis of the mid to distal basilar artery and severe stenosis of the non-dominant left intradural vertebral artery distally. 3. Small bilateral PCAs with multifocal severe stenosis of the left P2 PCA. CTA neck: Bilateral carotid bifurcation atherosclerosis without greater than 50% stenosis relative to the distal vessels. Electronically Signed   By: Margaretha Sheffield M.D.   On: 08/28/2021 09:59   CT Head Wo Contrast  Result Date: 08/27/2021 CLINICAL DATA:  Altered mental status. EXAM: CT HEAD WITHOUT CONTRAST TECHNIQUE: Contiguous axial images were obtained from the base of the skull through the vertex without intravenous contrast. COMPARISON:  None. FINDINGS: Brain: There is mild cerebral atrophy with widening of the extra-axial spaces and ventricular dilatation. There are areas of decreased attenuation within the white matter tracts of the supratentorial brain, consistent with microvascular disease changes. A 2.2 cm x 1.6 cm well-defined, chronic appearing area of low attenuation is seen within the cerebellum on the right. There is no evidence of associated mass effect or midline shift. A 1.2 cm x 0.7 cm x 1.2 cm partially  calcified area of extra-axial increased attenuation is seen within the right parietal region (axial CT image 17, CT series 2). Vascular: No hyperdense vessels are identified. Skull: Normal. Negative for fracture or focal lesion. Sinuses/Orbits: No acute finding. Other: The study is limited secondary to patient motion. IMPRESSION: 1. Area of extra-axial increased attenuation within the right parietal region which may represent a partially calcified meningioma. MRI correlation is recommended. 2. Chronic appearing right cerebellar infarct. 3. No acute intracranial abnormality. Electronically Signed   By: Virgina Norfolk M.D.   On:  08/27/2021 15:28   MR BRAIN W WO CONTRAST  Result Date: 08/28/2021 CLINICAL DATA:  75 year old female with delirium and hypertension. Weakness. Recent depression, stopped taking medications including insulin. EXAM: MRI HEAD WITHOUT AND WITH CONTRAST TECHNIQUE: Multiplanar, multiecho pulse sequences of the brain and surrounding structures were obtained without and with intravenous contrast. CONTRAST:  74mL GADAVIST GADOBUTROL 1 MMOL/ML IV SOLN COMPARISON:  Head CT 08/27/2021. FINDINGS: Study is intermittently degraded by motion artifact despite repeated imaging attempts, and no axial T2 weighted imaging could be obtained. Brain: Linear and rounded roughly 14 mm area of abnormal diffusion in the left paracentral pons (series 16, image 73 and series 18, image 58) is mildly restricted on ADC and associated with FLAIR hyperintensity. There is a microhemorrhage there in the left pons (series 22, image 17) which could be acute or chronic. No significant mass effect. There is patchy post ischemic enhancement on series 29, image 22. No other abnormal diffusion. Patchy chronic right superior cerebellar infarct with encephalomalacia. Patchy bilateral cerebral white matter FLAIR hyperintensity. Negative for age deep gray matter nuclei. Lateral and 3rd ventriculomegaly with diminutive cerebral  aqueduct and 4th ventricle. No transependymal edema suspected, in the temporal horns remain fairly diminutive. Smooth oval homogeneously enhancing extra-axial mass along the posterior right temporal lobe measures 15 mm diameter, 7 mm in thickness, and there is regional associated dural thickening. No significant mass effect. No regional cerebral edema. No superimposed midline shift. Cervicomedullary junction and pituitary are within normal limits. No other abnormal intracranial enhancement or dural thickening identified. Vascular: Major intracranial vascular flow voids appear preserved on the lone coronal T2 weighted imaging. The major dural venous sinuses are enhancing and appear to be patent. Skull and upper cervical spine: Negative visible cervical spine. Visualized bone marrow signal is within normal limits. Sinuses/Orbits: Negative orbits. Paranasal sinuses and mastoids are stable and well aerated. Other: Grossly normal visible internal auditory structures. IMPRESSION: 1. Subacute infarct of the left paracentral pons. Associated microhemorrhage but no malignant hemorrhagic transformation or mass effect. 2. Underlying chronic right SCA territory infarct. 3. Small 15 mm meningioma along the right convexity with no mass effect or associated edema. 4. 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 But no acute ventriculomegaly or transependymal edema suspected. Electronically Signed   By: Genevie Ann M.D.   On: 08/28/2021 07:43   DG CHEST PORT 1 VIEW  Result Date: 08/28/2021 CLINICAL DATA:  Fever. EXAM: PORTABLE CHEST 1 VIEW COMPARISON:  Chest x-ray 07/03/2012. FINDINGS: The left costophrenic angle has been excluded from the examination. There is no focal lung consolidation, pleural effusion or pneumothorax. The cardiomediastinal silhouette is within normal limits. No acute fractures are seen. There is a right shoulder arthroplasty. IMPRESSION: No active disease. Electronically Signed    By: Ronney Asters M.D.   On: 08/28/2021 20:26    Medications:    aspirin EC  325 mg Oral Daily   atorvastatin  40 mg Oral Daily   clopidogrel  75 mg Oral Daily   cyanocobalamin  1,000 mcg Intramuscular Daily   Followed by   Derrill Memo ON 09/04/2021] vitamin B-12  1,000 mcg Oral Daily   enoxaparin (LOVENOX) injection  40 mg Subcutaneous QHS   insulin aspart  0-15 Units Subcutaneous Q4H   insulin glargine-yfgn  20 Units Subcutaneous Daily   metoprolol tartrate  25 mg Oral BID   Continuous Infusions:  lactated ringers 75 mL/hr at 08/29/21 0700     LOS: 2 days   Geradine Girt  [facility]   How to contact the [facility] Attending or Consulting provider [place] or covering provider during after hours [place], for this patient?  Check the care team in [facility] and look for a) attending/consulting TR[facility] provider listed and b) the [facility] team listed Log into [email or web address] and use [place]'s universal password to access. If you do not have the password, please contact the hospital operator. Locate the [facility] provider you are looking for under [facility] and page to a number that you can be directly reached. If you still have difficulty reaching the provider, please page the [facility] (Director on Call) for the Hospitalists listed on amion for assistance.  08/29/2021, 12:50 PM

## 2021-08-29 NOTE — TOC Initial Note (Addendum)
Transition of Care [facility]) - Initial/Assessment Note    Patient Details  Name: Sonya Duffy MRN: [id number] Date of Birth: 07-08-1946  [facility] CM/SW Contact:    Carles Collet, RN Phone Number: 08/29/2021, 1:20 PM  Clinical Narrative:              Patient from home. Had stopped taking her home medications after her cat ran away. Admitted slurred speech and altered mental status in the setting of significant metabolic derangements secondary to DKA due to medication nonadherence.  Patient sleeping in room, did not wake up. Called daughter, no answer LVM.  16:00 Spoke w daughter. She explains that her mom lives her brother who has psychiatric limitations, she helps acre for him. Patient is independently ambulatory at home, walking through the neighborhood to visit with neighbors. Her daughter helps to support them by ordering her groceries and driving to appointments. She is interested in private duty care, resources emailed to her at [email or web address]. She is also interested in [facility] services, so PT OT evals were ordered. TOC will continue to follow.       Katy Fitch Daughter [phone]    Expected Discharge Plan: Home/Self Care Barriers to Discharge: Continued Medical Work up   Patient Goals and CMS Choice        Expected Discharge Plan and Services Expected Discharge Plan: Home/Self Care   Discharge Planning Services: CM Consult   Living arrangements for the past 2 months: Single Family Home                                      Prior Living Arrangements/Services Living arrangements for the past 2 months: Single Family Home Lives with:: Self                   Activities of Daily Living      Permission Sought/Granted                  Emotional Assessment              Admission diagnosis:  DKA (diabetic ketoacidosis) ([place]) [E11.10] Age-related cognitive decline [R41.81] AKI (acute kidney injury) ([place]) [N17.9] Diabetic  ketoacidosis without coma associated with type 2 diabetes mellitus ([place]) [E11.10] Patient Active Problem List   Diagnosis Date Noted   Non-ST elevation (NSTEMI) myocardial infarction ([place]) 08/29/2021   Cerebral thrombosis with cerebral infarction 08/28/2021   DKA (diabetic ketoacidosis) ([place]) 08/27/2021   PCP:  Pcp, No Pharmacy:   [facility] [place], [address] [place] [address] Phone: [phone] Fax: [phone]     Social Determinants of Health (SDOH) Interventions    Readmission Risk Interventions No flowsheet data found.

## 2021-08-29 NOTE — Progress Notes (Signed)
HOSPITAL MEDICINE OVERNIGHT EVENT NOTE    Notified by nursing that patient is complaining of low back pain as well as ongoing vaginal irritation.  Low back pain is seemingly chronic.  Vaginal irritation is also chronic according to daughter who is at the bedside.  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  We will go ahead and reorder a regimen of Premarin while patient is hospitalized  We will additionally order additional Tylenol and topical lidocaine cream for patient's low back pain.  Vernelle Emerald  MD [facility]

## 2021-08-30 ENCOUNTER — Inpatient Hospital Stay (HOSPITAL_COMMUNITY): Payer: Medicare Other

## 2021-08-30 DIAGNOSIS — E0810 Diabetes mellitus due to underlying condition with ketoacidosis without coma: Secondary | ICD-10-CM | POA: Diagnosis not present

## 2021-08-30 DIAGNOSIS — R7881 Bacteremia: Secondary | ICD-10-CM | POA: Diagnosis not present

## 2021-08-30 DIAGNOSIS — I214 Non-ST elevation (NSTEMI) myocardial infarction: Secondary | ICD-10-CM | POA: Diagnosis not present

## 2021-08-30 DIAGNOSIS — I6330 Cerebral infarction due to thrombosis of unspecified cerebral artery: Secondary | ICD-10-CM

## 2021-08-30 DIAGNOSIS — B9561 Methicillin susceptible Staphylococcus aureus infection as the cause of diseases classified elsewhere: Secondary | ICD-10-CM

## 2021-08-30 DIAGNOSIS — I513 Intracardiac thrombosis, not elsewhere classified: Secondary | ICD-10-CM | POA: Diagnosis not present

## 2021-08-30 LAB — ECHOCARDIOGRAM COMPLETE
AR max vel: 1.94 cm2
AV Area VTI: 2.06 cm2
AV Area mean vel: 1.86 cm2
AV Mean grad: 5 mmHg
AV Peak grad: 7.6 mmHg
Ao pk vel: 1.38 m/s
Area-P 1/2: 5.37 cm2
Height: 62 in
S' Lateral: 2.1 cm
Single Plane A4C EF: 63.4 %
Weight: 2464 oz

## 2021-08-30 LAB — BASIC METABOLIC PANEL
Anion gap: 11 (ref 5–15)
BUN: 14 mg/dL (ref 8–23)
CO2: 23 mmol/L (ref 22–32)
Calcium: 9.7 mg/dL (ref 8.9–10.3)
Chloride: 107 mmol/L (ref 98–111)
Creatinine, Ser: 1.39 mg/dL — ABNORMAL HIGH (ref 0.44–1.00)
GFR, Estimated: 39 mL/min — ABNORMAL LOW (ref >60)
Glucose, Bld: 141 mg/dL — ABNORMAL HIGH (ref 70–99)
Potassium: 3 mmol/L — ABNORMAL LOW (ref 3.5–5.1)
Sodium: 141 mmol/L (ref 135–145)

## 2021-08-30 LAB — MAGNESIUM: Magnesium: 1.1 mg/dL — ABNORMAL LOW (ref 1.7–2.4)

## 2021-08-30 LAB — GLUCOSE, CAPILLARY
Glucose-Capillary: 100 mg/dL — ABNORMAL HIGH (ref 70–99)
Glucose-Capillary: 142 mg/dL — ABNORMAL HIGH (ref 70–99)
Glucose-Capillary: 151 mg/dL — ABNORMAL HIGH (ref 70–99)
Glucose-Capillary: 221 mg/dL — ABNORMAL HIGH (ref 70–99)
Glucose-Capillary: 223 mg/dL — ABNORMAL HIGH (ref 70–99)
Glucose-Capillary: 258 mg/dL — ABNORMAL HIGH (ref 70–99)

## 2021-08-30 LAB — ECHOCARDIOGRAM LIMITED
Height: 62 in
Weight: 2464 oz

## 2021-08-30 LAB — METHYLMALONIC ACID, SERUM: Methylmalonic Acid, Quantitative: 135 nmol/L (ref 0–378)

## 2021-08-30 MED ORDER — MAGNESIUM SULFATE 2 GM/50ML IV SOLN
2.0000 g | Freq: Once | INTRAVENOUS | Status: AC
  Administered 2021-08-30: 2 g via INTRAVENOUS
  Filled 2021-08-30: qty 50

## 2021-08-30 MED ORDER — PERFLUTREN LIPID MICROSPHERE
1.0000 mL | INTRAVENOUS | Status: AC | PRN
  Administered 2021-08-30: 2 mL via INTRAVENOUS
  Filled 2021-08-30: qty 10

## 2021-08-30 MED ORDER — POTASSIUM CHLORIDE CRYS ER 20 MEQ PO TBCR
40.0000 meq | EXTENDED_RELEASE_TABLET | Freq: Once | ORAL | Status: AC
  Administered 2021-08-30: 40 meq via ORAL
  Filled 2021-08-30: qty 2

## 2021-08-30 MED ORDER — MELATONIN 5 MG PO TABS
5.0000 mg | ORAL_TABLET | Freq: Once | ORAL | Status: AC
  Administered 2021-08-31: 5 mg via ORAL
  Filled 2021-08-30: qty 1

## 2021-08-30 MED ORDER — ASPIRIN 325 MG PO TABS
325.0000 mg | ORAL_TABLET | Freq: Every day | ORAL | Status: DC
  Administered 2021-08-31 – 2021-09-05 (×6): 325 mg via ORAL
  Filled 2021-08-30 (×6): qty 1

## 2021-08-30 NOTE — Progress Notes (Signed)
Progress Note    Sonya Duffy  OVZ:[id number] DOB: 16-Sep-1946  DOA: 08/27/2021 PCP: Pcp, No    Brief Narrative:     Medical records reviewed and are as summarized below:  Sonya Duffy is an 75 y.o. female patient was found to be in DKA.  CT head was done which showed chronic appearing right cerebellar infarct, area of extra axial increase attenuation within the right parietal region which may represent partially calcified meningioma.  Neurology was consulted.  MRI brain showed subacute infarct of left paracentral pons, associated microhemorrhage but no malignant hemorrhagic transformation or mass-effect.  Blood cultures positive for MSSA.    Assessment/Plan:   Principal Problem:   DKA (diabetic ketoacidosis) ([place]) Active Problems:   Cerebral thrombosis with cerebral infarction   Non-ST elevation (NSTEMI) myocardial infarction (HCC)   Fever- blood culture + for MSSA -unclear source but she does complain of back pain so may need imaging-- defer to ID -U/A negative X ray negative -abx started  Diabetic ketoacidosis -Resolved -lantus/SSI   Acute Stroke -MRI brain showed left pontine infarct -Neurology consulted -2D echo: done- repeat with contrast -Hemoglobin A1c 13.3 -Neurology recommends aspirin and Plavix for 3 weeks and then continue aspirin alone  NSTEMI -Troponin elevated at 653; 657; 967; 1001 -Cardiology consulted -Not a candidate for left heart cath   Metabolic encephalopathy -Multifactorial, likely from diabetic ketoacidosis, stroke, NSTEMI -We will continue to monitor   Partially calcified meningioma -Seen on CT head and MRI brain -No mass-effect seen, no intervention recommended   Acute kidney injury -Likely from poor p.o. intake -Creatinine was 2.38 on admission -Improved with IVF   B12 deficiency -B12 was found to be 147 -Started on 1000 mcg IM daily for 7 days followed by 1000 mcg p.o. daily   Hypokalemia -replete     Family  Communication/Anticipated D/C date and plan/Code Status   DVT prophylaxis: Lovenox ordered. Code Status: Full Code.  Family Communication: at bedside Disposition Plan: Status is: Inpatient  Remains inpatient appropriate because:Inpatient level of care appropriate due to severity of illness  Dispo: The patient is from: Home              Anticipated d/c is to: home              Patient currently is not medically stable to d/c.   Difficult to place patient No         Medical Consultants:   Neuro Cards ID (automatic consult)  Subjective:   Does not want to work with OT this AM  Objective:    Vitals:   08/30/21 0733 08/30/21 0810 08/30/21 0814 08/30/21 1145  BP: (!) 147/69 (!) 147/60 (!) 147/60 (!) 111/55  Pulse: 98 96 90 79  Resp: 17 17 18 19   Temp: 98.6 F (37 C) 99.1 F (37.3 C) 99.1 F (37.3 C) 98.3 F (36.8 C)  TempSrc: Oral Axillary Oral Oral  SpO2: 98% 98% 100% 96%  Weight:      Height:        Intake/Output Summary (Last 24 hours) at 08/30/2021 1208 Last data filed at 08/30/2021 0300 Gross per 24 hour  Intake 115.19 ml  Output --  Net 115.19 ml   Filed Weights   08/27/21 1557  Weight: 69.9 kg    Exam:   General: Appearance:     Overweight female in no acute distress     Lungs:     respirations unlabored  Heart:    Normal heart  rate.    MS:   All extremities are intact.    Neurologic:   Awake, alert, pleasant       Data Reviewed:   I have personally reviewed following labs and imaging studies:  Labs: Labs show the following:   Basic Metabolic Panel: Recent Labs  Lab 08/28/21 1231 08/28/21 1919 08/28/21 2341 08/29/21 0035 08/30/21 0448  NA 137 141 140 138 141  K 3.4* 3.4* 4.2 3.7 3.0*  CL 108 112* 110 108 107  CO2 19* 20* 20* 17* 23  GLUCOSE 133* 158* 219* 217* 141*  BUN 22 18 19 17 14   CREATININE 1.43* 1.44* 1.45* 1.48* 1.39*  CALCIUM 9.1 9.6 9.6 9.5 9.7  MG  --   --   --   --  1.1*   GFR Estimated Creatinine  Clearance: 31 mL/min (A) (by C-G formula based on SCr of 1.39 mg/dL (H)). Liver Function Tests: Recent Labs  Lab 08/27/21 1313  AST 14*  ALT 14  ALKPHOS 208*  BILITOT 1.8*  PROT 8.0  ALBUMIN 3.0*   No results for input(s): LIPASE, AMYLASE in the last 168 hours. Recent Labs  Lab 08/28/21 0451  AMMONIA 20   Coagulation profile Recent Labs  Lab 08/27/21 1313  INR 1.2    CBC: Recent Labs  Lab 08/27/21 1313 08/27/21 1336  WBC 15.5*  --   NEUTROABS 11.9*  --   HGB 12.5 13.9  HCT 40.0 41.0  MCV 89.1  --   PLT 406*  --    Cardiac Enzymes: Recent Labs  Lab 08/29/21 0035  CKTOTAL 51   BNP (last 3 results) No results for input(s): PROBNP in the last 8760 hours. CBG: Recent Labs  Lab 08/29/21 1958 08/30/21 0037 08/30/21 0429 08/30/21 0736 08/30/21 1148  GLUCAP 255* 100* 142* 151* 223*   D-Dimer: No results for input(s): DDIMER in the last 72 hours. Hgb A1c: Recent Labs    08/28/21 0010  HGBA1C 13.3*   Lipid Profile: Recent Labs    08/28/21 0815  CHOL 134  HDL 19*  LDLCALC 90  TRIG 125  CHOLHDL 7.1   Thyroid function studies: Recent Labs    08/28/21 0451  TSH 1.372   Anemia work up: Recent Labs    08/28/21 0451  VITAMINB12 147*   Sepsis Labs: Recent Labs  Lab 08/27/21 1313  WBC 15.5*    Microbiology Recent Results (from the past 240 hour(s))  Resp Panel by RT-PCR (Flu A&B, Covid) Nasopharyngeal Swab     Status: None   Collection Time: 08/27/21  3:43 PM   Specimen: Nasopharyngeal Swab; Nasopharyngeal(NP) swabs in vial transport medium  Result Value Ref Range Status   SARS Coronavirus 2 by RT PCR NEGATIVE NEGATIVE Final    Comment: (NOTE) SARS-CoV-2 target nucleic acids are NOT DETECTED.  The SARS-CoV-2 RNA is generally detectable in upper respiratory specimens during the acute phase of infection. The lowest concentration of SARS-CoV-2 viral copies this assay can detect is 138 copies/mL. A negative result does not preclude  SARS-Cov-2 infection and should not be used as the sole basis for treatment or other patient management decisions. A negative result may occur with  improper specimen collection/handling, submission of specimen other than nasopharyngeal swab, presence of viral mutation(s) within the areas targeted by this assay, and inadequate number of viral copies(<138 copies/mL). A negative result must be combined with clinical observations, patient history, and epidemiological information. The expected result is Negative.  Fact Sheet for Patients:  EntrepreneurPulse.com.au  Fact  Sheet for Healthcare Providers:  IncredibleEmployment.be  This test is no t yet approved or cleared by the [place] FDA and  has been authorized for detection and/or diagnosis of SARS-CoV-2 by FDA under an Emergency Use Authorization (EUA). This EUA will remain  in effect (meaning this test can be used) for the duration of the COVID-19 declaration under Section 564(b)(1) of the Act, 21 U.S.C.section 360bbb-3(b)(1), unless the authorization is terminated  or revoked sooner.       Influenza A by PCR NEGATIVE NEGATIVE Final   Influenza B by PCR NEGATIVE NEGATIVE Final    Comment: (NOTE) The Xpert Xpress SARS-CoV-2/FLU/RSV plus assay is intended as an aid in the diagnosis of influenza from Nasopharyngeal swab specimens and should not be used as a sole basis for treatment. Nasal washings and aspirates are unacceptable for Xpert Xpress SARS-CoV-2/FLU/RSV testing.  Fact Sheet for Patients: EntrepreneurPulse.com.au  Fact Sheet for Healthcare Providers: IncredibleEmployment.be  This test is not yet approved or cleared by the [place] FDA and has been authorized for detection and/or diagnosis of SARS-CoV-2 by FDA under an Emergency Use Authorization (EUA). This EUA will remain in effect (meaning this test can be used) for the duration of  the COVID-19 declaration under Section 564(b)(1) of the Act, 21 U.S.C. section 360bbb-3(b)(1), unless the authorization is terminated or revoked.  Performed at [facility], [place] [address], [place], [place] [address]   Culture, blood (Routine X 2) w Reflex to ID Panel     Status: None (Preliminary result)   Collection Time: 08/28/21 11:41 PM   Specimen: BLOOD  Result Value Ref Range Status   Specimen Description BLOOD LEFT ANTECUBITAL  Final   Special Requests AEROBIC BOTTLE ONLY Blood Culture adequate volume  Final   Culture   Final    NO GROWTH 1 DAY Performed at [facility], [place] [address] [place], [place] [address]    Report Status PENDING  Incomplete  Culture, blood (Routine X 2) w Reflex to ID Panel     Status: Abnormal (Preliminary result)   Collection Time: 08/28/21 11:42 PM   Specimen: BLOOD LEFT FOREARM  Result Value Ref Range Status   Specimen Description BLOOD LEFT FOREARM  Final   Special Requests   Final    BOTTLES DRAWN AEROBIC AND ANAEROBIC Blood Culture adequate volume   Culture  Setup Time   Final    GRAM POSITIVE COCCI IN CLUSTERS IN BOTH AEROBIC AND ANAEROBIC BOTTLES CRITICAL RESULT CALLED TO, READ BACK BY AND VERIFIED WITH: PHARMD G BARR [id number] AT 1831 BY CM    Culture (A)  Final    STAPHYLOCOCCUS AUREUS SUSCEPTIBILITIES TO FOLLOW Performed at [facility], [place] [address] [place], [place] [address]    Report Status PENDING  Incomplete  Blood Culture ID Panel (Reflexed)     Status: Abnormal   Collection Time: 08/28/21 11:42 PM  Result Value Ref Range Status   Enterococcus faecalis NOT DETECTED NOT DETECTED Final   Enterococcus Faecium NOT DETECTED NOT DETECTED Final   Listeria monocytogenes NOT DETECTED NOT DETECTED Final   Staphylococcus species DETECTED (A) NOT DETECTED Final    Comment: CRITICAL RESULT CALLED TO, READ BACK BY AND VERIFIED WITH: PHARMD G BARR [id number] AT 1830 BY CM    Staphylococcus aureus (BCID) DETECTED (A) NOT  DETECTED Final    Comment: CRITICAL RESULT CALLED TO, READ BACK BY AND VERIFIED WITH: PHARMD Rockey Situ [id number] AT 1830 BY CM    Staphylococcus  epidermidis NOT DETECTED NOT DETECTED Final   Staphylococcus lugdunensis NOT DETECTED NOT DETECTED Final   Streptococcus species NOT DETECTED NOT DETECTED Final   Streptococcus agalactiae NOT DETECTED NOT DETECTED Final   Streptococcus pneumoniae NOT DETECTED NOT DETECTED Final   Streptococcus pyogenes NOT DETECTED NOT DETECTED Final   A.calcoaceticus-baumannii NOT DETECTED NOT DETECTED Final   Bacteroides fragilis NOT DETECTED NOT DETECTED Final   Enterobacterales NOT DETECTED NOT DETECTED Final   Enterobacter cloacae complex NOT DETECTED NOT DETECTED Final   Escherichia coli NOT DETECTED NOT DETECTED Final   Klebsiella aerogenes NOT DETECTED NOT DETECTED Final   Klebsiella oxytoca NOT DETECTED NOT DETECTED Final   Klebsiella pneumoniae NOT DETECTED NOT DETECTED Final   Proteus species NOT DETECTED NOT DETECTED Final   Salmonella species NOT DETECTED NOT DETECTED Final   Serratia marcescens NOT DETECTED NOT DETECTED Final   Haemophilus influenzae NOT DETECTED NOT DETECTED Final   Neisseria meningitidis NOT DETECTED NOT DETECTED Final   Pseudomonas aeruginosa NOT DETECTED NOT DETECTED Final   Stenotrophomonas maltophilia NOT DETECTED NOT DETECTED Final   Candida albicans NOT DETECTED NOT DETECTED Final   Candida auris NOT DETECTED NOT DETECTED Final   Candida glabrata NOT DETECTED NOT DETECTED Final   Candida krusei NOT DETECTED NOT DETECTED Final   Candida parapsilosis NOT DETECTED NOT DETECTED Final   Candida tropicalis NOT DETECTED NOT DETECTED Final   Cryptococcus neoformans/gattii NOT DETECTED NOT DETECTED Final   Meth resistant mecA/C and MREJ NOT DETECTED NOT DETECTED Final    Comment: Performed at [facility], [address] [place], [place] [address]    Procedures and diagnostic studies:  DG CHEST PORT 1 VIEW  Result Date:  08/28/2021 CLINICAL DATA:  Fever. EXAM: PORTABLE CHEST 1 VIEW COMPARISON:  Chest x-ray 07/03/2012. FINDINGS: The left costophrenic angle has been excluded from the examination. There is no focal lung consolidation, pleural effusion or pneumothorax. The cardiomediastinal silhouette is within normal limits. No acute fractures are seen. There is a right shoulder arthroplasty. IMPRESSION: No active disease. Electronically Signed   By: Ronney Asters M.D.   On: 08/28/2021 20:26   ECHOCARDIOGRAM COMPLETE  Result Date: 08/30/2021    ECHOCARDIOGRAM REPORT   Patient Name:   Sonya Duffy Date of Exam: 08/29/2021 Medical Rec #:  [id number]       Height:       62.0 in Accession #:    [id number]      Weight:       154.0 lb Date of Birth:  05/04/1946       BSA:          20.711 m Patient Age:    34 years        BP:           150/82 mmHg Patient Gender: F               HR:           89 bpm. Exam Location:  Inpatient Procedure: 2D Echo, Cardiac Doppler and Color Doppler Indications:    CVA  History:        Patient has no prior history of Echocardiogram examinations.  Sonographer:    Tawnya Crook Referring Phys: [id number] [place]  Sonographer Comments: Definity contrast not attempted. IMPRESSIONS  1. Left ventricular ejection fraction, by estimation, is 55 to 60%. The left ventricle has normal function. The left ventricle demonstrates regional wall motion abnormalities (see scoring diagram/findings for description). There is  moderate asymmetric left ventricular hypertrophy of the basal-septal segment. Indeterminate diastolic filling due to E-A fusion.  2. Right ventricular systolic function is normal. The right ventricular size is normal. Tricuspid regurgitation signal is inadequate for assessing PA pressure.  3. The mitral valve is normal in structure. Trivial mitral valve regurgitation. No evidence of mitral stenosis.  4. The aortic valve is grossly normal. Aortic valve regurgitation is not visualized. No aortic stenosis  is present.  5. The inferior vena cava is normal in size with greater than 50% respiratory variability, suggesting right atrial pressure of 3 mmHg. Conclusion(s)/Recommendation(s): No intracardiac source of embolism detected on this transthoracic study. A transesophageal echocardiogram is recommended to exclude cardiac source of embolism if clinically indicated. Recommend limited echo with Definity contrast since LV apex appears hypokinetic. Cannot evaluate for LV apical thrombus based on this exam. FINDINGS  Left Ventricle: Left ventricular ejection fraction, by estimation, is 55 to 60%. The left ventricle has normal function. The left ventricle demonstrates regional wall motion abnormalities. The left ventricular internal cavity size was normal in size. There is moderate asymmetric left ventricular hypertrophy of the basal-septal segment. Indeterminate diastolic filling due to E-A fusion.  LV Wall Scoring: The apical lateral segment, apical septal segment, apical anterior segment, and apical inferior segment are hypokinetic. Right Ventricle: The right ventricular size is normal. No increase in right ventricular wall thickness. Right ventricular systolic function is normal. Tricuspid regurgitation signal is inadequate for assessing PA pressure. The tricuspid regurgitant velocity is 1.75 m/s, and with an assumed right atrial pressure of 3 mmHg, the estimated right ventricular systolic pressure is 93.8 mmHg. Left Atrium: Left atrial size was normal in size. Right Atrium: Right atrial size was normal in size. Pericardium: There is no evidence of pericardial effusion. Mitral Valve: The mitral valve is normal in structure. Trivial mitral valve regurgitation. No evidence of mitral valve stenosis. Tricuspid Valve: The tricuspid valve is normal in structure. Tricuspid valve regurgitation is trivial. No evidence of tricuspid stenosis. Aortic Valve: The aortic valve is grossly normal. Aortic valve regurgitation is not  visualized. No aortic stenosis is present. Aortic valve mean gradient measures 5.0 mmHg. Aortic valve peak gradient measures 7.6 mmHg. Aortic valve area, by VTI measures 2.06 cm. Pulmonic Valve: The pulmonic valve was grossly normal. Pulmonic valve regurgitation is trivial. No evidence of pulmonic stenosis. Aorta: The aortic root is normal in size and structure and the ascending aorta was not well visualized. Venous: The inferior vena cava is normal in size with greater than 50% respiratory variability, suggesting right atrial pressure of 3 mmHg. IAS/Shunts: No atrial level shunt detected by color flow Doppler.  LEFT VENTRICLE PLAX 2D LVIDd:         3.20 cm  Diastology LVIDs:         2.10 cm  LV e' medial:    5.77 cm/s LV PW:         1.10 cm  LV E/e' medial:  8.2 LV IVS:        1.40 cm  LV e' lateral:   6.09 cm/s LVOT diam:     1.80 cm  LV E/e' lateral: 7.8 LV SV:         52 LV SV Index:   30 LVOT Area:     2.54 cm  RIGHT VENTRICLE             IVC RV S prime:     10.00 cm/s  IVC diam: 1.40 cm TAPSE (M-mode): 1.9 cm LEFT  ATRIUM           Index       RIGHT ATRIUM          Index LA diam:      2.60 cm 1.52 cm/m  RA Area:     8.61 cm LA Vol (A2C): 18.4 ml 10.76 ml/m RA Volume:   17.20 ml 10.05 ml/m LA Vol (A4C): 21.4 ml 12.51 ml/m  AORTIC VALVE                    PULMONIC VALVE AV Area (Vmax):    1.94 cm     PV Vmax:       1.29 m/s AV Area (Vmean):   1.86 cm     PV Peak grad:  6.7 mmHg AV Area (VTI):     2.06 cm AV Vmax:           138.00 cm/s AV Vmean:          107.000 cm/s AV VTI:            0.251 m AV Peak Grad:      7.6 mmHg AV Mean Grad:      5.0 mmHg LVOT Vmax:         105.00 cm/s LVOT Vmean:        78.100 cm/s LVOT VTI:          0.203 m LVOT/AV VTI ratio: 0.81  AORTA Ao Root diam: 3.30 cm MITRAL VALVE               TRICUSPID VALVE MV Area (PHT): 5.37 cm    TR Peak grad:   12.2 mmHg MV E velocity: 47.60 cm/s  TR Vmax:        175.00 cm/s MV A velocity: 95.50 cm/s MV E/A ratio:  0.50        SHUNTS                             Systemic VTI:  0.20 m                            Systemic Diam: 1.80 cm Cherlynn Kaiser MD Electronically signed by Cherlynn Kaiser MD Signature Date/Time: 08/30/2021/1:31:27 AM    Final     Medications:    aspirin  325 mg Oral Daily   atorvastatin  40 mg Oral Daily   clopidogrel  75 mg Oral Daily   conjugated estrogens  1 Applicatorful Vaginal QHS   cyanocobalamin  1,000 mcg Intramuscular Daily   Followed by   Derrill Memo ON 09/04/2021] vitamin B-12  1,000 mcg Oral Daily   enoxaparin (LOVENOX) injection  40 mg Subcutaneous QHS   insulin aspart  0-15 Units Subcutaneous Q4H   insulin glargine-yfgn  25 Units Subcutaneous Daily   metoprolol tartrate  25 mg Oral BID   Continuous Infusions:   ceFAZolin (ANCEF) IV 2 g (08/30/21 0502)     LOS: 3 days   Geradine Girt  [facility]   How to contact the [facility] Attending or Consulting provider [place] or covering provider during after hours [place], for this patient?  Check the care team in [facility] and look for a) attending/consulting [facility] provider listed and b) the [facility] team listed Log into [email or web address] and use [place]'s universal password to access. If you do not have the password, please contact the hospital operator. Locate  the [facility] provider you are looking for under [facility] and page to a number that you can be directly reached. If you still have difficulty reaching the provider, please page the [facility] (Director on Call) for the Hospitalists listed on amion for assistance.  08/30/2021, 12:08 PM

## 2021-08-30 NOTE — Progress Notes (Signed)
SLP Cancellation Note  Patient Details Name: Sonya Duffy MRN: [id number] DOB: [birth year]   Cancelled treatment:       Reason Eval/Treat Not Completed: Patient at procedure or test/unavailable (Pt having echo completed. SLP will follow up.)  Lafreda Casebeer I. Hardin Negus, [place], [place] Office number [phone] Pager [place] 08/30/2021, 5:28 PM

## 2021-08-30 NOTE — Progress Notes (Signed)
Progress Note  Patient Name: Sonya Duffy Date of Encounter: 08/30/2021  [facility] Cardiologist: Pixie Casino, MD (New)  Subjective   No further fever - BCx 1/2 bottles staph, ?contaminant. Creatinine continues to improve. Echo shows probable distal LAD/apical infarct - non-contrast echo, but apex is akinetic and there could have been apical thrombus.  Inpatient Medications    Scheduled Meds:  aspirin  325 mg Oral Daily   atorvastatin  40 mg Oral Daily   clopidogrel  75 mg Oral Daily   conjugated estrogens  1 Applicatorful Vaginal QHS   cyanocobalamin  1,000 mcg Intramuscular Daily   Followed by   Derrill Memo ON 09/04/2021] vitamin B-12  1,000 mcg Oral Daily   enoxaparin (LOVENOX) injection  40 mg Subcutaneous QHS   insulin aspart  0-15 Units Subcutaneous Q4H   insulin glargine-yfgn  25 Units Subcutaneous Daily   metoprolol tartrate  25 mg Oral BID   Continuous Infusions:   ceFAZolin (ANCEF) IV 2 g (08/30/21 0502)   PRN Meds: acetaminophen, dextrose, lidocaine   Vital Signs    Vitals:   08/30/21 0430 08/30/21 0733 08/30/21 0810 08/30/21 0814  BP: (!) 131/52 (!) 147/69 (!) 147/69 (!) 147/60  Pulse: 95 98 96 90  Resp: 20 17 17 18   Temp: 98.7 F (37.1 C) 98.6 F (37 C) 99.1 F (37.3 C) 99.1 F (37.3 C)  TempSrc: Axillary Oral Axillary Oral  SpO2: 94% 98% 98% 100%  Weight:      Height:        Intake/Output Summary (Last 24 hours) at 08/30/2021 1100 Last data filed at 08/30/2021 0300 Gross per 24 hour  Intake 115.19 ml  Output --  Net 115.19 ml   Last 3 Weights 08/27/2021  Weight (lbs) 154 lb  Weight (kg) 69.854 kg      Telemetry    Sinus rhythm with lead artifact - personally reviewed   ECG   N/A  Physical Exam   GEN: No acute distress. More alert today Neck: No JVD. Cardiac: RRR Respiratory: Clear to auscultation anteriorly. No wheezes, rhonchi, or rales. MS: No lower extremity edema. No deformity. Neuro:  No focal deficits. Psych:  Pleasant  Labs    High Sensitivity Troponin:   Recent Labs  Lab 08/28/21 0451 08/28/21 0753 08/28/21 1231 08/28/21 2341 08/29/21 0035  TROPONINIHS 657* 967* 1,001* 864* 802*     Chemistry Recent Labs  Lab 08/27/21 1313 08/27/21 1336 08/28/21 2341 08/29/21 0035 08/30/21 0448  NA 129*   < > 140 138 141  K 4.1   < > 4.2 3.7 3.0*  CL 94*   < > 110 108 107  CO2 8*   < > 20* 17* 23  GLUCOSE 599*   < > 219* 217* 141*  BUN 38*   < > 19 17 14   CREATININE 2.38*   < > 1.45* 1.48* 1.39*  CALCIUM 9.5   < > 9.6 9.5 9.7  MG  --   --   --   --  1.1*  PROT 8.0  --   --   --   --   ALBUMIN 3.0*  --   --   --   --   AST 14*  --   --   --   --   ALT 14  --   --   --   --   ALKPHOS 208*  --   --   --   --   BILITOT 1.8*  --   --   --   --  GFRNONAA 20*   < > 37* 36* 39*  ANIONGAP 27*   < > 10 13 11    < > = values in this interval not displayed.    Lipids  Recent Labs  Lab 08/28/21 0815  CHOL 134  TRIG 125  HDL 19*  LDLCALC 90  CHOLHDL 7.1    Hematology Recent Labs  Lab 08/27/21 1313 08/27/21 1336  WBC 15.5*  --   RBC 4.49  --   HGB 12.5 13.9  HCT 40.0 41.0  MCV 89.1  --   MCH 27.8  --   MCHC 31.3  --   RDW 13.7  --   PLT 406*  --    Thyroid  Recent Labs  Lab 08/28/21 0451  TSH 1.372    BNPNo results for input(s): BNP, PROBNP in the last 168 hours.  DDimer No results for input(s): DDIMER in the last 168 hours.   Radiology    DG CHEST PORT 1 VIEW  Result Date: 08/28/2021 CLINICAL DATA:  Fever. EXAM: PORTABLE CHEST 1 VIEW COMPARISON:  Chest x-ray 07/03/2012. FINDINGS: The left costophrenic angle has been excluded from the examination. There is no focal lung consolidation, pleural effusion or pneumothorax. The cardiomediastinal silhouette is within normal limits. No acute fractures are seen. There is a right shoulder arthroplasty. IMPRESSION: No active disease. Electronically Signed   By: Ronney Asters M.D.   On: 08/28/2021 20:26   ECHOCARDIOGRAM  COMPLETE  Result Date: 08/30/2021    ECHOCARDIOGRAM REPORT   Patient Name:   Sonya Duffy Date of Exam: 08/29/2021 Medical Rec #:  [id number]       Height:       62.0 in Accession #:    [id number]      Weight:       154.0 lb Date of Birth:  Jul 25, 1946       BSA:          49.711 m Patient Age:    75 years        BP:           150/82 mmHg Patient Gender: F               HR:           89 bpm. Exam Location:  Inpatient Procedure: 2D Echo, Cardiac Doppler and Color Doppler Indications:    CVA  History:        Patient has no prior history of Echocardiogram examinations.  Sonographer:    Tawnya Crook Referring Phys: [id number] [place]  Sonographer Comments: Definity contrast not attempted. IMPRESSIONS  1. Left ventricular ejection fraction, by estimation, is 55 to 60%. The left ventricle has normal function. The left ventricle demonstrates regional wall motion abnormalities (see scoring diagram/findings for description). There is moderate asymmetric left ventricular hypertrophy of the basal-septal segment. Indeterminate diastolic filling due to E-A fusion.  2. Right ventricular systolic function is normal. The right ventricular size is normal. Tricuspid regurgitation signal is inadequate for assessing PA pressure.  3. The mitral valve is normal in structure. Trivial mitral valve regurgitation. No evidence of mitral stenosis.  4. The aortic valve is grossly normal. Aortic valve regurgitation is not visualized. No aortic stenosis is present.  5. The inferior vena cava is normal in size with greater than 50% respiratory variability, suggesting right atrial pressure of 3 mmHg. Conclusion(s)/Recommendation(s): No intracardiac source of embolism detected on this transthoracic study. A transesophageal echocardiogram is recommended to exclude cardiac source of embolism if  clinically indicated. Recommend limited echo with Definity contrast since LV apex appears hypokinetic. Cannot evaluate for LV apical thrombus based on  this exam. FINDINGS  Left Ventricle: Left ventricular ejection fraction, by estimation, is 55 to 60%. The left ventricle has normal function. The left ventricle demonstrates regional wall motion abnormalities. The left ventricular internal cavity size was normal in size. There is moderate asymmetric left ventricular hypertrophy of the basal-septal segment. Indeterminate diastolic filling due to E-A fusion.  LV Wall Scoring: The apical lateral segment, apical septal segment, apical anterior segment, and apical inferior segment are hypokinetic. Right Ventricle: The right ventricular size is normal. No increase in right ventricular wall thickness. Right ventricular systolic function is normal. Tricuspid regurgitation signal is inadequate for assessing PA pressure. The tricuspid regurgitant velocity is 1.75 m/s, and with an assumed right atrial pressure of 3 mmHg, the estimated right ventricular systolic pressure is 91.6 mmHg. Left Atrium: Left atrial size was normal in size. Right Atrium: Right atrial size was normal in size. Pericardium: There is no evidence of pericardial effusion. Mitral Valve: The mitral valve is normal in structure. Trivial mitral valve regurgitation. No evidence of mitral valve stenosis. Tricuspid Valve: The tricuspid valve is normal in structure. Tricuspid valve regurgitation is trivial. No evidence of tricuspid stenosis. Aortic Valve: The aortic valve is grossly normal. Aortic valve regurgitation is not visualized. No aortic stenosis is present. Aortic valve mean gradient measures 5.0 mmHg. Aortic valve peak gradient measures 7.6 mmHg. Aortic valve area, by VTI measures 2.06 cm. Pulmonic Valve: The pulmonic valve was grossly normal. Pulmonic valve regurgitation is trivial. No evidence of pulmonic stenosis. Aorta: The aortic root is normal in size and structure and the ascending aorta was not well visualized. Venous: The inferior vena cava is normal in size with greater than 50% respiratory  variability, suggesting right atrial pressure of 3 mmHg. IAS/Shunts: No atrial level shunt detected by color flow Doppler.  LEFT VENTRICLE PLAX 2D LVIDd:         3.20 cm  Diastology LVIDs:         2.10 cm  LV e' medial:    5.77 cm/s LV PW:         1.10 cm  LV E/e' medial:  8.2 LV IVS:        1.40 cm  LV e' lateral:   6.09 cm/s LVOT diam:     1.80 cm  LV E/e' lateral: 7.8 LV SV:         52 LV SV Index:   30 LVOT Area:     2.54 cm  RIGHT VENTRICLE             IVC RV S prime:     10.00 cm/s  IVC diam: 1.40 cm TAPSE (M-mode): 1.9 cm LEFT ATRIUM           Index       RIGHT ATRIUM          Index LA diam:      2.60 cm 1.52 cm/m  RA Area:     8.61 cm LA Vol (A2C): 18.4 ml 10.76 ml/m RA Volume:   17.20 ml 10.05 ml/m LA Vol (A4C): 21.4 ml 12.51 ml/m  AORTIC VALVE                    PULMONIC VALVE AV Area (Vmax):    1.94 cm     PV Vmax:       1.29 m/s AV Area (Vmean):  1.86 cm     PV Peak grad:  6.7 mmHg AV Area (VTI):     2.06 cm AV Vmax:           138.00 cm/s AV Vmean:          107.000 cm/s AV VTI:            0.251 m AV Peak Grad:      7.6 mmHg AV Mean Grad:      5.0 mmHg LVOT Vmax:         105.00 cm/s LVOT Vmean:        78.100 cm/s LVOT VTI:          0.203 m LVOT/AV VTI ratio: 0.81  AORTA Ao Root diam: 3.30 cm MITRAL VALVE               TRICUSPID VALVE MV Area (PHT): 5.37 cm    TR Peak grad:   12.2 mmHg MV E velocity: 47.60 cm/s  TR Vmax:        175.00 cm/s MV A velocity: 95.50 cm/s MV E/A ratio:  0.50        SHUNTS                            Systemic VTI:  0.20 m                            Systemic Diam: 1.80 cm Cherlynn Kaiser MD Electronically signed by Cherlynn Kaiser MD Signature Date/Time: 08/30/2021/1:31:27 AM    Final     Cardiac Studies   Echo pending.  Patient Profile     75 y.o. female history of hypertension, hyperlipidemia, insulin-dependent diabetes mellitus who was admitted on 08/27/2021 with DKA, subacute/acute stroke, and AKI. Cardiology consulted for evaluation of elevated troponin at the  request of Dr. Darrick Meigs.  Assessment & Plan    Elevated Troponin  Demand Ischemia Vs NSTEMI - Patient presented with DKA, AKI, and acute stroke. Found to have elevated troponin. - High-sensitivity troponin peaked at 1,001 and now down-trending.  - EKG shows T wave inversions in V2-V3. New compared to prior tracing in 2020. - No complaints of chest pain. - Echo pending.  - Started on DAPT with Aspirin and Plavix per Neurology. Plan is for DAPT for 3 months and then Aspirin alone. Also started on Lopressor 25mg  twice daily and Lipitor 40mg  daily. Of note, meds were held last night due to altered mental status and unable to take oral medications. Give when able. - Brain MRI showed some microhemorrhages so would not start IV Heparin.  - Echo yesterday shows preserved LVEF with apical hypokinesis to akinesis - no obvious thrombus, but suspicion given stroke - limited echo with contrast recommended-  will order today.   Subacute/Acute Stroke - Imaging concerning for subacute/acute pontine stroke. Felt to be due to mall vessel disease in the setting of uncontrolled risk factors. Also has significant carotid and vertebral disease on CTA. - Continue aspirin and statin. - Management per Neurology.   Hypertension - BP somewhat labile.  - Continue low dose beta-blocker as above. - Suspect will recommend ARB if renal function returns to normal   Hyperlipidemia - Lipid panel this admission: Total Cholesterol 134, Triglycerides 125, HDL 19, LDL 90.  - LDL <70 given stroke and possible CAD. - Continue Lipitor 40mg  daily.   DKA Insulin-Dependent Diabetes - Patient had not been taking  insulin for the past several days to weeks. CBG in the 580s on arrival. - Hemoglobin A1c 13.3. - Management per primary team.  AKI - Creatinine 2.38 on admission. Baseline was 1.17 in 2020, continues to improve - Likely due to dehydration in setting of poor oral intake and DKA. - Improved with fluids. Creatinine 1.48  today. - Continue to monitor.  Fever - No further fever - 1/2 bottles BCx shows staph, further work-up pending  For questions or updates, please contact [place] Please consult [email or web address] for contact info under   Pixie Casino, MD, FACC, [place] Director of the [facility] Diplomate of the American Board of Clinical Lipidology Attending Cardiologist  Direct Dial: [phone]  Fax: [phone]  Website:  [email or web address]  Pixie Casino, MD  08/30/2021, 11:00 AM

## 2021-08-30 NOTE — Consult Note (Signed)
[facility] for Infectious Disease    Date of Admission:  08/27/2021     Total days of antibiotics 1               Reason for Consult: MSSA Bacteremia  Referring Provider: [employer] Primary Care Provider: Pcp, No   ASSESSMENT:  Sonya Duffy is a 75 y/o caucasian female admitted with DKA secondary to stopping insulin due to an exacerbation of depression and noted to have fevers with blood culture positive for MSSA. There is no clear source of infection at present and she complains of no pain. She is confused which nursing reports is baseline. Echocardiogram ordered to check for endocarditis. Repeat blood cultures in the morning. Suspect this is likely a simple bacteremia however if fevers continue or there is persistent bacteremia may need to consider additional imaging. Continue current dose of Cefazolin. Remaining care per primary team. ID will continue to follow.   PLAN:  Continue Cefazolin.  Echocardiogram to check for endocarditis.  Repeat blood cultures in the morning.  Diabetes and remaining care per primary team.  ID will follow.    Principal Problem:   DKA (diabetic ketoacidosis) ([place]) Active Problems:   Cerebral thrombosis with cerebral infarction   Non-ST elevation (NSTEMI) myocardial infarction ([facility])    aspirin  325 mg Oral Daily   atorvastatin  40 mg Oral Daily   clopidogrel  75 mg Oral Daily   conjugated estrogens  1 Applicatorful Vaginal QHS   cyanocobalamin  1,000 mcg Intramuscular Daily   Followed by   Derrill Memo ON 09/04/2021] vitamin B-12  1,000 mcg Oral Daily   enoxaparin (LOVENOX) injection  40 mg Subcutaneous QHS   insulin aspart  0-15 Units Subcutaneous Q4H   insulin glargine-yfgn  25 Units Subcutaneous Daily   metoprolol tartrate  25 mg Oral BID     HPI: Sonya Duffy is a 75 y.o. female with previous medical history of diabetes and hypertension admitted with complaints of weakness.   Per chart review, Sonya Duffy's cat ran away  about 1 week ago resulting in increased depression and her stopping her medication which has resulted in her feeling weak, with nausea, vomiting and diarrhea. Daughter also noted slurred speech for the previous 2 days prior to admission. CT head with area of extra-axial increased attenuation in the right parietal region possible representing meningioma and chronic appearing right cerebellar infarct. MRI brain with subacute infarct of the left paracentral pons; underlying chronic right SCA territory infarct, and small meningioma along the right convexity with no mass effect. Found to have DKA and was started on IV fluids and insulin. On the second day of admission noted to have fever. Blood cultures drawn positive for MSSA in 2/4 bottles.   Sonya Duffy is confused during exam and provides less than reliable history. She has no pain and is feeling good.   Review of Systems: Review of Systems  Constitutional:  Negative for chills, fever and weight loss.  Respiratory:  Negative for cough, shortness of breath and wheezing.   Cardiovascular:  Negative for chest pain and leg swelling.  Gastrointestinal:  Negative for abdominal pain, constipation, diarrhea, nausea and vomiting.  Skin:  Negative for rash.    Past Medical History:  Diagnosis Date   Diabetes mellitus    Hypertension     Social History   Tobacco Use   Smoking status: Never   Smokeless tobacco: Never  Substance Use Topics   Alcohol use: No   Drug use:  No    Family History  Problem Relation Age of Onset   Heart attack Mother     No Known Allergies  OBJECTIVE: Blood pressure (!) 111/55, pulse 79, temperature 98.3 F (36.8 C), temperature source Oral, resp. rate 19, height 5\' 2"  (1.575 m), weight 69.9 kg, SpO2 96 %.  Physical Exam Constitutional:      General: She is not in acute distress.    Appearance: She is well-developed.     Comments: Lying in bed sleeping on arrival. Arousable; pleasant.   Cardiovascular:      Rate and Rhythm: Normal rate and regular rhythm.     Heart sounds: Normal heart sounds.  Pulmonary:     Effort: Pulmonary effort is normal.     Breath sounds: Normal breath sounds.  Abdominal:     General: Bowel sounds are normal.  Skin:    General: Skin is warm and dry.     Findings: No rash.  Neurological:     Mental Status: She is alert.     Comments: Oriented to person only.     Lab Results Lab Results  Component Value Date   WBC 15.5 (H) 08/27/2021   HGB 13.9 08/27/2021   HCT 41.0 08/27/2021   MCV 89.1 08/27/2021   PLT 406 (H) 08/27/2021    Lab Results  Component Value Date   CREATININE 1.39 (H) 08/30/2021   BUN 14 08/30/2021   NA 141 08/30/2021   K 3.0 (L) 08/30/2021   CL 107 08/30/2021   CO2 23 08/30/2021    Lab Results  Component Value Date   ALT 14 08/27/2021   AST 14 (L) 08/27/2021   ALKPHOS 208 (H) 08/27/2021   BILITOT 1.8 (H) 08/27/2021     Microbiology: Recent Results (from the past 240 hour(s))  Resp Panel by RT-PCR (Flu A&B, Covid) Nasopharyngeal Swab     Status: None   Collection Time: 08/27/21  3:43 PM   Specimen: Nasopharyngeal Swab; Nasopharyngeal(NP) swabs in vial transport medium  Result Value Ref Range Status   SARS Coronavirus 2 by RT PCR NEGATIVE NEGATIVE Final    Comment: (NOTE) SARS-CoV-2 target nucleic acids are NOT DETECTED.  The SARS-CoV-2 RNA is generally detectable in upper respiratory specimens during the acute phase of infection. The lowest concentration of SARS-CoV-2 viral copies this assay can detect is 138 copies/mL. A negative result does not preclude SARS-Cov-2 infection and should not be used as the sole basis for treatment or other patient management decisions. A negative result may occur with  improper specimen collection/handling, submission of specimen other than nasopharyngeal swab, presence of viral mutation(s) within the areas targeted by this assay, and inadequate number of viral copies(<138 copies/mL). A  negative result must be combined with clinical observations, patient history, and epidemiological information. The expected result is Negative.  Fact Sheet for Patients:  EntrepreneurPulse.com.au  Fact Sheet for Healthcare Providers:  IncredibleEmployment.be  This test is no t yet approved or cleared by the [place] FDA and  has been authorized for detection and/or diagnosis of SARS-CoV-2 by FDA under an Emergency Use Authorization (EUA). This EUA will remain  in effect (meaning this test can be used) for the duration of the COVID-19 declaration under Section 564(b)(1) of the Act, 21 U.S.C.section 360bbb-3(b)(1), unless the authorization is terminated  or revoked sooner.       Influenza A by PCR NEGATIVE NEGATIVE Final   Influenza B by PCR NEGATIVE NEGATIVE Final    Comment: (NOTE) The Xpert Xpress SARS-CoV-2/FLU/RSV  plus assay is intended as an aid in the diagnosis of influenza from Nasopharyngeal swab specimens and should not be used as a sole basis for treatment. Nasal washings and aspirates are unacceptable for Xpert Xpress SARS-CoV-2/FLU/RSV testing.  Fact Sheet for Patients: EntrepreneurPulse.com.au  Fact Sheet for Healthcare Providers: IncredibleEmployment.be  This test is not yet approved or cleared by the [place] FDA and has been authorized for detection and/or diagnosis of SARS-CoV-2 by FDA under an Emergency Use Authorization (EUA). This EUA will remain in effect (meaning this test can be used) for the duration of the COVID-19 declaration under Section 564(b)(1) of the Act, 21 U.S.C. section 360bbb-3(b)(1), unless the authorization is terminated or revoked.  Performed at [facility], [place] [address], [place], [place] [address]   Culture, blood (Routine X 2) w Reflex to ID Panel     Status: None (Preliminary result)   Collection Time: 08/28/21 11:41 PM   Specimen: BLOOD   Result Value Ref Range Status   Specimen Description BLOOD LEFT ANTECUBITAL  Final   Special Requests AEROBIC BOTTLE ONLY Blood Culture adequate volume  Final   Culture   Final    NO GROWTH 1 DAY Performed at [facility], [place] [address], [place], [place] [address]    Report Status PENDING  Incomplete  Culture, blood (Routine X 2) w Reflex to ID Panel     Status: Abnormal (Preliminary result)   Collection Time: 08/28/21 11:42 PM   Specimen: BLOOD LEFT FOREARM  Result Value Ref Range Status   Specimen Description BLOOD LEFT FOREARM  Final   Special Requests   Final    BOTTLES DRAWN AEROBIC AND ANAEROBIC Blood Culture adequate volume   Culture  Setup Time   Final    GRAM POSITIVE COCCI IN CLUSTERS IN BOTH AEROBIC AND ANAEROBIC BOTTLES CRITICAL RESULT CALLED TO, READ BACK BY AND VERIFIED WITH: PHARMD G BARR [id number] AT 1831 BY CM    Culture (A)  Final    STAPHYLOCOCCUS AUREUS SUSCEPTIBILITIES TO FOLLOW Performed at [facility], [place] [address] [place], [place] [address]    Report Status PENDING  Incomplete  Blood Culture ID Panel (Reflexed)     Status: Abnormal   Collection Time: 08/28/21 11:42 PM  Result Value Ref Range Status   Enterococcus faecalis NOT DETECTED NOT DETECTED Final   Enterococcus Faecium NOT DETECTED NOT DETECTED Final   Listeria monocytogenes NOT DETECTED NOT DETECTED Final   Staphylococcus species DETECTED (A) NOT DETECTED Final    Comment: CRITICAL RESULT CALLED TO, READ BACK BY AND VERIFIED WITH: PHARMD G BARR [id number] AT 1830 BY CM    Staphylococcus aureus (BCID) DETECTED (A) NOT DETECTED Final    Comment: CRITICAL RESULT CALLED TO, READ BACK BY AND VERIFIED WITH: PHARMD G BARR [id number] AT 1830 BY CM    Staphylococcus epidermidis NOT DETECTED NOT DETECTED Final   Staphylococcus lugdunensis NOT DETECTED NOT DETECTED Final   Streptococcus species NOT DETECTED NOT DETECTED Final   Streptococcus agalactiae NOT DETECTED NOT DETECTED Final    Streptococcus pneumoniae NOT DETECTED NOT DETECTED Final   Streptococcus pyogenes NOT DETECTED NOT DETECTED Final   A.calcoaceticus-baumannii NOT DETECTED NOT DETECTED Final   Bacteroides fragilis NOT DETECTED NOT DETECTED Final   Enterobacterales NOT DETECTED NOT DETECTED Final   Enterobacter cloacae complex NOT DETECTED NOT DETECTED Final   Escherichia coli NOT DETECTED NOT DETECTED Final   Klebsiella aerogenes NOT DETECTED NOT DETECTED Final   Klebsiella oxytoca NOT  DETECTED NOT DETECTED Final   Klebsiella pneumoniae NOT DETECTED NOT DETECTED Final   Proteus species NOT DETECTED NOT DETECTED Final   Salmonella species NOT DETECTED NOT DETECTED Final   Serratia marcescens NOT DETECTED NOT DETECTED Final   Haemophilus influenzae NOT DETECTED NOT DETECTED Final   Neisseria meningitidis NOT DETECTED NOT DETECTED Final   Pseudomonas aeruginosa NOT DETECTED NOT DETECTED Final   Stenotrophomonas maltophilia NOT DETECTED NOT DETECTED Final   Candida albicans NOT DETECTED NOT DETECTED Final   Candida auris NOT DETECTED NOT DETECTED Final   Candida glabrata NOT DETECTED NOT DETECTED Final   Candida krusei NOT DETECTED NOT DETECTED Final   Candida parapsilosis NOT DETECTED NOT DETECTED Final   Candida tropicalis NOT DETECTED NOT DETECTED Final   Cryptococcus neoformans/gattii NOT DETECTED NOT DETECTED Final   Meth resistant mecA/C and MREJ NOT DETECTED NOT DETECTED Final    Comment: Performed at [facility], [place] [address], [place],  [address]     Terri Piedra, [facility]  08/30/2021  1:18 PM

## 2021-08-30 NOTE — Evaluation (Signed)
Occupational Therapy Evaluation Sonya Duffy Details Name: Sonya Duffy MRN: [id number] DOB: 15-Feb-1946 Today's Date: 08/30/2021   History of Present Illness Sonya Duffy is an 75 y.o. female Sonya Duffy was found to be in DKA. Also with metabolic encephalopathy, NSTEMI and AKI. 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 Evaluation prolonged and yet limited due to Sonya Duffy's difficulty with following directions and needing increased time, rewording or changing task to meet Sonya Duffy's needs. Sonya Duffy exhibited grossly functional ROM and strength of upper extremities but unable to follow commands for muscle testing. Dexterity seemed to be somewhat impaired but not necessarily neurologically. Sonya Duffy mod assist to transfer into sitting and reports generalized stomach pain and has a recent history of back pain from fall in tub. Sonya Duffy is min assist to stand and min assist with walker to take steps to recliner. Sonya Duffy unable to don sock but able to perform oral care with set up and verbal cues. Unsure if vision impaired - unable to count fingers in quadrants correctly or visually track - but also could just be limited by cognition. Functionally she had no overt signs of visual impairment today but will need to be further assessed. She has a tendency to always look to the left and away from therapist when unable to answer a question or follow a command. She did have some complaints of dizziness and "not feeling good" with activity. Ability to participate improved with time during evaluation and treatment. Sonya Duffy will benefit from skilled OT services while in hospital to improve deficits and learn compensatory strategies as needed in order to return to PLOF.  Sonya Duffy's Sonya Duffy wants to take  Sonya Duffy to her house at discharge and she will have 24/7 assistance as she works from home.       Recommendations for follow up therapy are one component of a multi-disciplinary discharge planning process, led by the attending physician.  Recommendations may be updated based on Sonya Duffy status, additional functional criteria and insurance authorization.   Follow Up Recommendations  Home health OT    Equipment Recommendations  None recommended by OT    Recommendations for Other Services       Precautions / Restrictions Precautions Precautions: Fall Restrictions Weight Bearing Restrictions: No      Mobility Bed Mobility Overal bed mobility: Needs Assistance Bed Mobility: Supine to Sit     Supine to sit: HOB elevated;Mod assist     General bed mobility comments: Mod assist to guide LEs and assist with trunk negotiation to transfer to side of bed. Patinet reports recent hx of falling in tub with back pain - today she is complaining of generalized stomach pain.    Transfers Overall transfer level: Needs assistance Equipment used: Rolling walker (2 wheeled) Transfers: Sit to/from [facility] Sit to Stand: Min assist Stand pivot transfers: Min assist       General transfer comment: Initially min assist to stand without device but unable to maintain standing due to reports of "not feeling good" and being dizzy and Sonya Duffy laid herself back down. After prolonged conversation with Sonya Duffy attempted standing again with walker to get Sonya Duffy up to the chair. Min assist to stand and min assist to take steps and back up to recliner. Assistance for walker management (difficulty with size of room and layout). Min assist for steadying  and mild posterior loss of balance when backing up to recliner.    Balance Overall balance assessment: Needs assistance Sitting-balance support: No upper extremity supported;Feet supported Sitting balance-Leahy Scale: Fair Sitting balance  - Comments: intermittent assistance to maintain sitting - Sonya Duffy wanting to prop - but mostly from fatigue and wanting to return to bed.   Standing balance support: During functional activity Standing balance-Leahy Scale: Poor Standing balance comment: reliant on external assistance.                           ADL either performed or assessed with clinical judgement   ADL Overall ADL's : Needs assistance/impaired Eating/Feeding: Set up;Cueing for sequencing;Sitting   Grooming: Set up;Oral care;Sitting;Cueing for sequencing Grooming Details (indicate cue type and reason): in recliner, increased time to perform with decreased dexterity of right hand. Upper Body Bathing: Set up;Sitting;Cueing for sequencing   Lower Body Bathing: Moderate assistance;Sit to/from stand;Cueing for sequencing   Upper Body Dressing : Moderate assistance;Sitting;Cueing for sequencing   Lower Body Dressing: Maximal assistance;Sit to/from stand;Cueing for sequencing   Toilet Transfer: Minimal assistance;Stand-pivot;BSC;RW   Toileting- Clothing Manipulation and Hygiene: Maximal assistance;Sit to/from stand               Vision Baseline Vision/History: 1 Wears glasses Vision Assessment?: Vision impaired- to be further tested in functional context Additional Comments: Unsure of visual deficits. was unable to follow finger for visual tracking, difficulty with counting fingers in quadrant - though limited by cognition. Will continue to assess functionally. Did okay with manuevering walker in itight space and performing oral care - no gross signs of peripheral loss. No complaints of double vision. Poor attention. Sonya Duffy is consistently looking a way to the left especially when asked questions the can't answer - not exactly neglect or inattention. Will continue to follow.     Perception     Praxis      Pertinent Vitals/Pain Pain Assessment: Faces Faces Pain Scale: Hurts little more Pain Location:  stomach Pain Descriptors / Indicators: Grimacing;Discomfort;Restless Pain Intervention(s): Limited activity within Sonya Duffy's tolerance;Monitored during session     Hand Dominance Right   Extremity/Trunk Assessment Upper Extremity Assessment Upper Extremity Assessment: RUE deficits/detail;LUE deficits/detail;Difficult to assess due to impaired cognition RUE Deficits / Details: Hx of shoulder injury, approx 80 degrees shoulder flexion otherwise ROM is grossly functional. At least 3+/5 strength throughout. Attempted MMT but Sonya Duffy unable to perform well. RUE Sensation:  (no apparent sensation deficits. Reports no when asked if any abnormal sensations.) RUE Coordination:  (grossly functional, unable to follow commands to perform finger to thumb.) LUE Deficits / Details: WFL ROM, at least 3+/5 strength, difficulty with MMT. LUE Sensation:  (no apparent sensation deficits. Reports no when asked if any abnormal sensations.) LUE Coordination:  (grossly functional, unable to follow commands to perform coordination testing.)   Lower Extremity Assessment Lower Extremity Assessment: Defer to PT evaluation   Cervical / Trunk Assessment Cervical / Trunk Assessment: Kyphotic   Communication Communication Communication: No difficulties   Cognition Arousal/Alertness: Awake/alert Behavior During Therapy: WFL for tasks assessed/performed Overall Cognitive Status: Impaired/Different from baseline Area of Impairment: Orientation;Attention;Memory;Following commands;Safety/judgement;Awareness;Problem solving                 Orientation Level: Disoriented to;Time Current Attention Level: Sustained Memory: Decreased short-term memory Following Commands: Follows one step commands inconsistently Safety/Judgement: Decreased awareness of deficits Awareness: Emergent Problem Solving: Slow processing;Decreased initiation;Difficulty sequencing;Requires verbal cues General Comments: Sonya Duffy reports at  baseline her mother would not know the month, year or President.   General Comments       Exercises     Shoulder Instructions      Home Living Family/Sonya Duffy expects to be discharged to:: Private residence Living Arrangements: Children Available Help at Discharge: Family;Available 24 hours/day Type of Home: House Home Access: Stairs to enter [employer] of Steps: 5   Home Layout: One level     Bathroom Shower/Tub: Teacher, early years/pre: Standard                Prior Functioning/Environment Level of Independence: Independent        Comments: Sonya Duffy lived at home with son with psychiatric issues. Sonya Duffy reports "they helped each other." Sonya Duffy was independent with ADLs and light house work. Did not use a device.        OT Problem List: Decreased strength;Decreased range of motion;Decreased activity tolerance;Impaired balance (sitting and/or standing);Impaired vision/perception;Decreased cognition;Decreased safety awareness;Decreased knowledge of use of DME or AE;Pain      OT Treatment/Interventions: Self-care/ADL training;Therapeutic exercise;Neuromuscular education;DME and/or AE instruction;Therapeutic activities;Balance training;Sonya Duffy/family education;Visual/perceptual remediation/compensation    OT Goals(Current goals can be found in the care plan section) Acute Rehab OT Goals Sonya Duffy Stated Goal: go home with Sonya Duffy OT Goal Formulation: With Sonya Duffy/family Time For Goal Achievement: 09/13/21 Potential to Achieve Goals: Good  OT Frequency: Min 2X/week   Barriers to D/C:            Co-evaluation              AM-PAC OT "6 Clicks" Daily Activity     Outcome Measure Help from another person eating meals?: A Little Help from another person taking care of personal grooming?: A Little Help from another person toileting, which includes using toliet, bedpan, or urinal?: A Lot Help from another person bathing (including  washing, rinsing, drying)?: A Lot Help from another person to put on and taking off regular upper body clothing?: A Lot Help from another person to put on and taking off regular lower body clothing?: A Lot 6 Click Score: 14   End of Session Equipment Utilized During Treatment: Surveyor, mining Communication: Mobility status  Activity Tolerance: Sonya Duffy tolerated treatment well Sonya Duffy left: in chair;with call bell/phone within reach;with family/visitor present  OT Visit Diagnosis: Unsteadiness on feet (R26.81);History of falling (Z91.81);Muscle weakness (generalized) (M62.81);Other symptoms and signs involving cognitive function                Time: 2549-8264 OT Time Calculation (min): 53 min Charges:  OT General Charges $OT Visit: 1 Visit OT Evaluation $OT Eval Moderate Complexity: 1 Mod OT Treatments $Therapeutic Activity: 8-22 mins  Paulita Licklider, OTR/L [place] [phone] Pager: [place] 08/30/2021, 9:47 AM

## 2021-08-30 NOTE — Evaluation (Signed)
Physical Therapy Evaluation Patient Details Name: Sonya Duffy MRN: [id number] DOB: [birth year] Today's Date: 08/30/2021  History of Present Illness  Sonya Duffy is an 75 y.o. female admitted on 08/27/21 and patient was found to be in DKA. Also with metabolic encephalopathy, NSTEMI and AKI. 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 Pt with hx of DM and HTN.  Clinical Impression  Pt admitted with above diagnosis. At baseline, pt is independent without AD and lived with her son.  Today, pt with decreased cognition requiring cues/assist for sequencing, initiation, and safety. She required min-mod A for transfers and ambulation.  Daughter present and educated on home needs and transfer techniques; she declines SNF and plans to take pt home at d/c.   Pt currently with functional limitations due to the deficits listed below (see PT Problem List). Pt will benefit from skilled PT to increase their independence and safety with mobility to allow discharge to the venue listed below.          Recommendations for follow up therapy are one component of a multi-disciplinary discharge planning process, led by the attending physician.  Recommendations may be updated based on patient status, additional functional criteria and insurance authorization.  Follow Up Recommendations Home health PT;Supervision/Assistance - 24 hour (daughter declines SNF)    Equipment Recommendations  Rolling walker with 5" wheels;3in1 (PT)    Recommendations for Other Services       Precautions / Restrictions Precautions Precautions: Fall Restrictions Weight Bearing Restrictions: No      Mobility  Bed Mobility Overal bed mobility: Needs Assistance Bed Mobility: Supine to Sit;Sit to Supine     Supine to sit: Mod assist Sit to supine: Min assist   General bed mobility comments: Increased cues for sequencing and increased time; requiring assist to lift trunk to sit and for legs back to bed; pt able  to reposition in bed with cues; educated daughter on transfer techniques    Transfers Overall transfer level: Needs assistance Equipment used: Rolling walker (2 wheeled) Transfers: Sit to/from Stand Sit to Stand: Min assist Stand pivot transfers: Min assist       General transfer comment: Sit to stand x 3 during session with increased time and assist to rise; Tried to have pt push up from bed but pt unable to follow commands and kept putting hands on RW  Ambulation/Gait Ambulation/Gait assistance: Min assist;Mod assist Gait Distance (Feet): 60 Feet Assistive device: Rolling walker (2 wheeled) Gait Pattern/deviations: Step-to pattern;Decreased stride length;Trunk flexed Gait velocity: decreased   General Gait Details: pt tending to keep walker too far forward and unable to correct, tending to drift L and run into objects requiring assist, mostly min A but mod A for LOB  Stairs            Wheelchair Mobility    Modified Rankin (Stroke Patients Only) Modified Rankin (Stroke Patients Only) Pre-Morbid Rankin Score: No significant disability Modified Rankin: Moderately severe disability     Balance Overall balance assessment: Needs assistance Sitting-balance support: No upper extremity supported;Feet supported Sitting balance-Leahy Scale: Fair Sitting balance - Comments: intermittent assistance to maintain sitting - patient wanting to prop - but mostly from fatigue and wanting to return to bed.   Standing balance support: Bilateral upper extremity supported Standing balance-Leahy Scale: Poor Standing balance comment: reliant on external assistance.  Pertinent Vitals/Pain Pain Assessment: No/denies pain     Home Living Family/patient expects to be discharged to:: Private residence Living Arrangements: Children Available Help at Discharge: Family;Available 24 hours/day Type of Home: House Home Access: Stairs to enter    [employer] of Steps: 5 Home Layout: One level Home Equipment: None      Prior Function Level of Independence: Independent         Comments: Patient lived at home with son with psychiatric issues. Daughter reports "they helped each other." Patient was independent with ADLs and light house work. Did not use a device.     Hand Dominance   Dominant Hand: Right    Extremity/Trunk Assessment   Upper Extremity Assessment Upper Extremity Assessment: Defer to OT evaluation Lower Extremity Assessment Lower Extremity Assessment: LLE deficits/detail;RLE deficits/detail RLE Deficits / Details: ROM : WFL; Not following MMT commands but demonstrates at least 3/5 LLE Deficits / Details: ROM : WFL; Not following MMT commands but demonstrates at least 3/5    Cervical / Trunk Assessment Cervical / Trunk Assessment: Kyphotic  Communication   Communication: No difficulties  Cognition Arousal/Alertness: Awake/alert Behavior During Therapy: WFL for tasks assessed/performed Overall Cognitive Status: Impaired/Different from baseline Area of Impairment: Orientation;Attention;Memory;Following commands;Safety/judgement;Awareness;Problem solving                 Orientation Level: Disoriented to;Time;Place;Situation Current Attention Level: Sustained Memory: Decreased short-term memory Following Commands: Follows one step commands inconsistently Safety/Judgement: Decreased awareness of deficits Awareness: Emergent Problem Solving: Slow processing;Decreased initiation;Difficulty sequencing;Requires verbal cues;Requires tactile cues General Comments: Daughter reports at baseline her mother would not know the month, year or President, but is functional (makes meals, etc)      General Comments General comments (skin integrity, edema, etc.): Daughter present and wants to take pt home at d/c - discussed home needs and transfer technique.  Daughter also asked if she could get pt to  [facility] - showed stand pivot technique and pt not hooked to IV at this time  OT tested vision earlier and pt with some difficulty following commands.  She tended to run into objects on L but was able to identify objects on R and L peripheral field. Questionable decreased vision vs cognition    Exercises     Assessment/Plan    PT Assessment Patient needs continued PT services  PT Problem List Decreased strength;Decreased mobility;Decreased safety awareness;Decreased coordination;Decreased activity tolerance;Decreased cognition;Decreased balance;Decreased knowledge of use of DME       PT Treatment Interventions DME instruction;Therapeutic activities;Gait training;Therapeutic exercise;Patient/family education;Stair training;Balance training;Functional mobility training;Neuromuscular re-education    PT Goals (Current goals can be found in the Care Plan section)  Acute Rehab PT Goals Patient Stated Goal: go home with daughter PT Goal Formulation: With patient Time For Goal Achievement: 09/13/21 Potential to Achieve Goals: Good    Frequency Min 4X/week   Barriers to discharge        Co-evaluation               AM-PAC PT "6 Clicks" Mobility  Outcome Measure Help needed turning from your back to your side while in a flat bed without using bedrails?: A Little Help needed moving from lying on your back to sitting on the side of a flat bed without using bedrails?: A Little Help needed moving to and from a bed to a chair (including a wheelchair)?: A Little Help needed standing up from a chair using your arms (e.g., wheelchair or bedside chair)?: A Little Help needed to  walk in hospital room?: A Lot Help needed climbing 3-5 steps with a railing? : A Lot 6 Click Score: 16    End of Session Equipment Utilized During Treatment: Gait belt Activity Tolerance: Patient tolerated treatment well Patient left: in bed;with call bell/phone within reach;with bed alarm set;with family/visitor  present Nurse Communication: Mobility status PT Visit Diagnosis: Unsteadiness on feet (R26.81);Muscle weakness (generalized) (M62.81)    Time: 2536-6440 PT Time Calculation (min) (ACUTE ONLY): 28 min   Charges:   PT Evaluation $PT Eval Moderate Complexity: 1 Mod PT Treatments $Gait Training: 8-22 mins        Abran Richard, PT Acute Rehab Services Pager [phone] [facility] [place] 08/30/2021, 1:17 PM

## 2021-08-30 NOTE — Care Management Important Message (Signed)
Important Message  Patient Details  Name: Sonya Duffy MRN: [id number] Date of Birth: October 20, 1946   Medicare Important Message Given:  Yes     Bonner Larue [address] 08/30/2021, 3:12 PM

## 2021-08-31 DIAGNOSIS — I214 Non-ST elevation (NSTEMI) myocardial infarction: Secondary | ICD-10-CM | POA: Diagnosis not present

## 2021-08-31 DIAGNOSIS — R7881 Bacteremia: Secondary | ICD-10-CM | POA: Diagnosis not present

## 2021-08-31 DIAGNOSIS — I6330 Cerebral infarction due to thrombosis of unspecified cerebral artery: Secondary | ICD-10-CM | POA: Diagnosis not present

## 2021-08-31 DIAGNOSIS — B9561 Methicillin susceptible Staphylococcus aureus infection as the cause of diseases classified elsewhere: Secondary | ICD-10-CM | POA: Diagnosis not present

## 2021-08-31 DIAGNOSIS — E0810 Diabetes mellitus due to underlying condition with ketoacidosis without coma: Secondary | ICD-10-CM | POA: Diagnosis not present

## 2021-08-31 LAB — CULTURE, BLOOD (ROUTINE X 2): Special Requests: ADEQUATE

## 2021-08-31 LAB — MAGNESIUM: Magnesium: 1.4 mg/dL — ABNORMAL LOW (ref 1.7–2.4)

## 2021-08-31 LAB — GLUCOSE, CAPILLARY
Glucose-Capillary: 96 mg/dL (ref 70–99)
Glucose-Capillary: 160 mg/dL — ABNORMAL HIGH (ref 70–99)
Glucose-Capillary: 150 mg/dL — ABNORMAL HIGH (ref 70–99)
Glucose-Capillary: 263 mg/dL — ABNORMAL HIGH (ref 70–99)
Glucose-Capillary: 302 mg/dL — ABNORMAL HIGH (ref 70–99)
Glucose-Capillary: 209 mg/dL — ABNORMAL HIGH (ref 70–99)

## 2021-08-31 MED ORDER — ALUM & MAG HYDROXIDE-SIMETH 200-200-20 MG/5ML PO SUSP
30.0000 mL | ORAL | Status: DC | PRN
  Administered 2021-08-31 – 2021-09-01 (×2): 30 mL via ORAL
  Filled 2021-08-31 (×2): qty 30

## 2021-08-31 MED ORDER — INSULIN ASPART 100 UNIT/ML IJ SOLN
0.0000 [IU] | Freq: Three times a day (TID) | INTRAMUSCULAR | Status: DC
  Administered 2021-08-31: 5 [IU] via SUBCUTANEOUS
  Administered 2021-08-31: 11 [IU] via SUBCUTANEOUS
  Administered 2021-08-31 – 2021-09-01 (×5): 2 [IU] via SUBCUTANEOUS
  Administered 2021-09-02: 3 [IU] via SUBCUTANEOUS
  Administered 2021-09-02: 5 [IU] via SUBCUTANEOUS
  Administered 2021-09-02: 3 [IU] via SUBCUTANEOUS
  Administered 2021-09-03: 5 [IU] via SUBCUTANEOUS
  Administered 2021-09-03: 3 [IU] via SUBCUTANEOUS
  Administered 2021-09-04 – 2021-09-05 (×3): 8 [IU] via SUBCUTANEOUS
  Administered 2021-09-05: 2 [IU] via SUBCUTANEOUS
  Administered 2021-09-05: 3 [IU] via SUBCUTANEOUS

## 2021-08-31 MED ORDER — MAGNESIUM SULFATE 2 GM/50ML IV SOLN
2.0000 g | INTRAVENOUS | Status: AC
  Administered 2021-08-31 (×2): 2 g via INTRAVENOUS
  Filled 2021-08-31 (×2): qty 50

## 2021-08-31 NOTE — Progress Notes (Signed)
Progress Note  Patient Name: Sonya Duffy Date of Encounter: 08/31/2021  [facility] Cardiologist: Pixie Casino, MD   Subjective   Continues to be up most of the night. Improved today - walking a few steps to [facility]. Daughter at bedside. No chest pain or SOB.  Inpatient Medications    Scheduled Meds:  aspirin  325 mg Oral Daily   atorvastatin  40 mg Oral Daily   clopidogrel  75 mg Oral Daily   conjugated estrogens  1 Applicatorful Vaginal QHS   cyanocobalamin  1,000 mcg Intramuscular Daily   Followed by   Derrill Memo ON 09/04/2021] vitamin B-12  1,000 mcg Oral Daily   enoxaparin (LOVENOX) injection  40 mg Subcutaneous QHS   insulin aspart  0-15 Units Subcutaneous TID AC & HS   insulin glargine-yfgn  25 Units Subcutaneous Daily   melatonin  5 mg Oral Once   metoprolol tartrate  25 mg Oral BID   Continuous Infusions:   ceFAZolin (ANCEF) IV 2 g (08/31/21 0603)   PRN Meds: acetaminophen, dextrose, lidocaine   Vital Signs    Vitals:   08/30/21 2124 08/30/21 2308 08/31/21 0400 08/31/21 0805  BP: (!) 142/78 (!) 136/111 (!) 125/52 (!) 150/73  Pulse: 87 71 80   Resp: 15 20 (!) 22   Temp: 97.6 F (36.4 C) 99.1 F (37.3 C) 98.6 F (37 C) 98.3 F (36.8 C)  TempSrc: Axillary Axillary Axillary Axillary  SpO2: 95% 95% 97%   Weight:      Height:        Intake/Output Summary (Last 24 hours) at 08/31/2021 0806 Last data filed at 08/30/2021 1646 Gross per 24 hour  Intake 120 ml  Output --  Net 120 ml   Last 3 Weights 08/27/2021  Weight (lbs) 154 lb  Weight (kg) 69.854 kg      Telemetry    Sinus rhythm HR 60-80s, no PVCs - Personally Reviewed  ECG    No new tracings - Personally Reviewed  Physical Exam   GEN: elderly female in no acute distress.   Neck: No JVD Cardiac: RRR, no murmurs, rubs, or gallops.  Respiratory: Clear to auscultation bilaterally. GI: Soft, nontender, non-distended  MS: No edema; No deformity. Neuro:  Nonfocal, moves all extremities, no  slurred speech  Psych: Normal affect, pleasant  Labs    High Sensitivity Troponin:   Recent Labs  Lab 08/28/21 0451 08/28/21 0753 08/28/21 1231 08/28/21 2341 08/29/21 0035  TROPONINIHS 657* 967* 1,001* 864* 802*     Chemistry Recent Labs  Lab 08/27/21 1313 08/27/21 1336 08/28/21 2341 08/29/21 0035 08/30/21 0448  NA 129*   < > 140 138 141  K 4.1   < > 4.2 3.7 3.0*  CL 94*   < > 110 108 107  CO2 8*   < > 20* 17* 23  GLUCOSE 599*   < > 219* 217* 141*  BUN 38*   < > 19 17 14   CREATININE 2.38*   < > 1.45* 1.48* 1.39*  CALCIUM 9.5   < > 9.6 9.5 9.7  MG  --   --   --   --  1.1*  PROT 8.0  --   --   --   --   ALBUMIN 3.0*  --   --   --   --   AST 14*  --   --   --   --   ALT 14  --   --   --   --  ALKPHOS 208*  --   --   --   --   BILITOT 1.8*  --   --   --   --   GFRNONAA 20*   < > 37* 36* 39*  ANIONGAP 27*   < > 10 13 11    < > = values in this interval not displayed.    Lipids  Recent Labs  Lab 08/28/21 0815  CHOL 134  TRIG 125  HDL 19*  LDLCALC 90  CHOLHDL 7.1    Hematology Recent Labs  Lab 08/27/21 1313 08/27/21 1336  WBC 15.5*  --   RBC 4.49  --   HGB 12.5 13.9  HCT 40.0 41.0  MCV 89.1  --   MCH 27.8  --   MCHC 31.3  --   RDW 13.7  --   PLT 406*  --    Thyroid  Recent Labs  Lab 08/28/21 0451  TSH 1.372    BNPNo results for input(s): BNP, PROBNP in the last 168 hours.  DDimer No results for input(s): DDIMER in the last 168 hours.   Radiology    ECHOCARDIOGRAM COMPLETE  Result Date: 08/30/2021    ECHOCARDIOGRAM REPORT   Patient Name:   Sonya Duffy Date of Exam: 08/29/2021 Medical Rec #:  [id number]       Height:       62.0 in Accession #:    [id number]      Weight:       154.0 lb Date of Birth:  1946-04-05       BSA:          27.711 m Patient Age:    75 years        BP:           150/82 mmHg Patient Gender: F               HR:           89 bpm. Exam Location:  Inpatient Procedure: 2D Echo, Cardiac Doppler and Color Doppler Indications:     CVA  History:        Patient has no prior history of Echocardiogram examinations.  Sonographer:    Tawnya Crook Referring Phys: [id number] [place]  Sonographer Comments: Definity contrast not attempted. IMPRESSIONS  1. Left ventricular ejection fraction, by estimation, is 55 to 60%. The left ventricle has normal function. The left ventricle demonstrates regional wall motion abnormalities (see scoring diagram/findings for description). There is moderate asymmetric left ventricular hypertrophy of the basal-septal segment. Indeterminate diastolic filling due to E-A fusion.  2. Right ventricular systolic function is normal. The right ventricular size is normal. Tricuspid regurgitation signal is inadequate for assessing PA pressure.  3. The mitral valve is normal in structure. Trivial mitral valve regurgitation. No evidence of mitral stenosis.  4. The aortic valve is grossly normal. Aortic valve regurgitation is not visualized. No aortic stenosis is present.  5. The inferior vena cava is normal in size with greater than 50% respiratory variability, suggesting right atrial pressure of 3 mmHg. Conclusion(s)/Recommendation(s): No intracardiac source of embolism detected on this transthoracic study. A transesophageal echocardiogram is recommended to exclude cardiac source of embolism if clinically indicated. Recommend limited echo with Definity contrast since LV apex appears hypokinetic. Cannot evaluate for LV apical thrombus based on this exam. FINDINGS  Left Ventricle: Left ventricular ejection fraction, by estimation, is 55 to 60%. The left ventricle has normal function. The left ventricle demonstrates regional wall motion abnormalities. The  left ventricular internal cavity size was normal in size. There is moderate asymmetric left ventricular hypertrophy of the basal-septal segment. Indeterminate diastolic filling due to E-A fusion.  LV Wall Scoring: The apical lateral segment, apical septal segment, apical  anterior segment, and apical inferior segment are hypokinetic. Right Ventricle: The right ventricular size is normal. No increase in right ventricular wall thickness. Right ventricular systolic function is normal. Tricuspid regurgitation signal is inadequate for assessing PA pressure. The tricuspid regurgitant velocity is 1.75 m/s, and with an assumed right atrial pressure of 3 mmHg, the estimated right ventricular systolic pressure is 10.2 mmHg. Left Atrium: Left atrial size was normal in size. Right Atrium: Right atrial size was normal in size. Pericardium: There is no evidence of pericardial effusion. Mitral Valve: The mitral valve is normal in structure. Trivial mitral valve regurgitation. No evidence of mitral valve stenosis. Tricuspid Valve: The tricuspid valve is normal in structure. Tricuspid valve regurgitation is trivial. No evidence of tricuspid stenosis. Aortic Valve: The aortic valve is grossly normal. Aortic valve regurgitation is not visualized. No aortic stenosis is present. Aortic valve mean gradient measures 5.0 mmHg. Aortic valve peak gradient measures 7.6 mmHg. Aortic valve area, by VTI measures 2.06 cm. Pulmonic Valve: The pulmonic valve was grossly normal. Pulmonic valve regurgitation is trivial. No evidence of pulmonic stenosis. Aorta: The aortic root is normal in size and structure and the ascending aorta was not well visualized. Venous: The inferior vena cava is normal in size with greater than 50% respiratory variability, suggesting right atrial pressure of 3 mmHg. IAS/Shunts: No atrial level shunt detected by color flow Doppler.  LEFT VENTRICLE PLAX 2D LVIDd:         3.20 cm  Diastology LVIDs:         2.10 cm  LV e' medial:    5.77 cm/s LV PW:         1.10 cm  LV E/e' medial:  8.2 LV IVS:        1.40 cm  LV e' lateral:   6.09 cm/s LVOT diam:     1.80 cm  LV E/e' lateral: 7.8 LV SV:         52 LV SV Index:   30 LVOT Area:     2.54 cm  RIGHT VENTRICLE             IVC RV S prime:     10.00  cm/s  IVC diam: 1.40 cm TAPSE (M-mode): 1.9 cm LEFT ATRIUM           Index       RIGHT ATRIUM          Index LA diam:      2.60 cm 1.52 cm/m  RA Area:     8.61 cm LA Vol (A2C): 18.4 ml 10.76 ml/m RA Volume:   17.20 ml 10.05 ml/m LA Vol (A4C): 21.4 ml 12.51 ml/m  AORTIC VALVE                    PULMONIC VALVE AV Area (Vmax):    1.94 cm     PV Vmax:       1.29 m/s AV Area (Vmean):   1.86 cm     PV Peak grad:  6.7 mmHg AV Area (VTI):     2.06 cm AV Vmax:           138.00 cm/s AV Vmean:          107.000 cm/s AV VTI:  0.251 m AV Peak Grad:      7.6 mmHg AV Mean Grad:      5.0 mmHg LVOT Vmax:         105.00 cm/s LVOT Vmean:        78.100 cm/s LVOT VTI:          0.203 m LVOT/AV VTI ratio: 0.81  AORTA Ao Root diam: 3.30 cm MITRAL VALVE               TRICUSPID VALVE MV Area (PHT): 5.37 cm    TR Peak grad:   12.2 mmHg MV E velocity: 47.60 cm/s  TR Vmax:        175.00 cm/s MV A velocity: 95.50 cm/s MV E/A ratio:  0.50        SHUNTS                            Systemic VTI:  0.20 m                            Systemic Diam: 1.80 cm Cherlynn Kaiser MD Electronically signed by Cherlynn Kaiser MD Signature Date/Time: 08/30/2021/1:31:27 AM    Final    ECHOCARDIOGRAM LIMITED  Result Date: 08/30/2021    ECHOCARDIOGRAM LIMITED REPORT   Patient Name:   [name] Date of Exam: 08/30/2021 Medical Rec #:  [id number]       Height:       62.0 in Accession #:    [id number]      Weight:       154.0 lb Date of Birth:  [birth year]       BSA:          1.711 m Patient Age:    8 years        BP:           122/48 mmHg Patient Gender: F               HR:           88 bpm. Exam Location:  Inpatient Procedure: Limited Echo and Intracardiac Opacification Agent Indications:    Rule out thrombus  History:        Patient has prior history of Echocardiogram examinations, most                 recent 08/29/2021. Stroke.  Sonographer:    Merrie Roof RDCS Referring Phys: [place]  1. Limited study with definity to  R/O apical thrombus; apical akinesis with overall low normal LV function; no thrombus noted.  2. Left ventricular ejection fraction, by estimation, is 50 to 55%. The left ventricle has low normal function. The left ventricle demonstrates regional wall motion abnormalities (see scoring diagram/findings for description).  3. Right ventricular systolic function is normal. The right ventricular size is normal. FINDINGS  Left Ventricle: Left ventricular ejection fraction, by estimation, is 50 to 55%. The left ventricle has low normal function. The left ventricle demonstrates regional wall motion abnormalities. Definity contrast agent was given IV to delineate the left ventricular endocardial borders. Right Ventricle: The right ventricular size is normal. Right ventricular systolic function is normal. Left Atrium: Left atrial size was normal in size. Right Atrium: Right atrial size was normal in size. Pericardium: There is no evidence of pericardial effusion. Additional Comments: Limited study with definity to R/O apical thrombus; apical akinesis with overall low normal  LV function; no thrombus noted. Kirk Ruths MD Electronically signed by Kirk Ruths MD Signature Date/Time: 08/30/2021/6:12:08 PM    Final     Cardiac Studies   Limited echo 08/30/21: 1. Limited study with definity to R/O apical thrombus; apical akinesis  with overall low normal LV function; no thrombus noted.   2. Left ventricular ejection fraction, by estimation, is 50 to 55%. The  left ventricle has low normal function. The left ventricle demonstrates  regional wall motion abnormalities (see scoring diagram/findings for  description).   3. Right ventricular systolic function is normal. The right ventricular  size is normal.    Echo 08/29/21:  1. Left ventricular ejection fraction, by estimation, is 55 to 60%. The  left ventricle has normal function. The left ventricle demonstrates  regional wall motion abnormalities (see scoring  diagram/findings for  description). There is moderate asymmetric  left ventricular hypertrophy of the basal-septal segment. Indeterminate  diastolic filling due to E-A fusion.   2. Right ventricular systolic function is normal. The right ventricular  size is normal. Tricuspid regurgitation signal is inadequate for assessing  PA pressure.   3. The mitral valve is normal in structure. Trivial mitral valve  regurgitation. No evidence of mitral stenosis.   4. The aortic valve is grossly normal. Aortic valve regurgitation is not  visualized. No aortic stenosis is present.   5. The inferior vena cava is normal in size with greater than 50%  respiratory variability, suggesting right atrial pressure of 3 mmHg.   Patient Profile     75 y.o. female with a history of hypertension, hyperlipidemia, insulin-dependent diabetes mellitus who was admitted on 08/27/2021 with DKA, subacute/acute stroke, and AKI. Cardiology consulted for evaluation of elevated troponin.  Assessment & Plan    Elevated troponin Demand ischemia vs NSTEMI - elevated troponin in the setting of DKA, AKI, and acute CVA - hs troponin peaked at 1001, now down-trending - EKG with TWI in V2-3 - echo showed LVEF 55-60%, moderate LVH, and RWMA with apical hypokinesis to akinesis - limited echo with definity did not show thrombus - Mg 1.4 (1.1) after 2 g IV - will order another 4 g - K 3.0 with sCr 1.39 - no chest pain   CVA - subacute/acute pontine stroke felt du to small vessel disease in the setting of uncontrolled risk factors and significant carotid and vertebral disease on CTA - started on ASA and plavix per neuro x 3 months then ASA monotherapy - statin started   Hypertension - BP continues to be labile - elevated prior to AM metoprolol - will not add any therapy for now - given mobility, would not be overly aggressive with her BP   Hyperlipidemia with LDL goal < 70 08/28/2021: Cholesterol 134; HDL 19; LDL Cholesterol  90; Triglycerides 125; VLDL 25 - lipitor 40 mg   DKA IDDM with hyperglycemia - A1c 13.3% - presented after noncompliance with insulin for weeks-months - CBG was > 500 - she is agreeable to compliance   AKI - sCr 2.38 --> improved to 1.39 - baseline 1.10   Fever - 1/2 blood cx positive for MSSA, ?contaminant - no further fever - per ID, repeat blood cultures pending - on cefazolin      For questions or updates, please contact [place] Please consult [email or web address] for contact info under        Signed, Ledora Bottcher, PA  08/31/2021, 8:06 AM

## 2021-08-31 NOTE — Progress Notes (Signed)
Physical Therapy Treatment Patient Details Name: Sonya Duffy MRN: [id number] DOB: 1946/09/16 Today's Date: 08/31/2021   History of Present Illness Sonya Duffy is an 75 y.o. female admitted on 08/27/21 and patient was found to be in DKA. Also with metabolic encephalopathy, NSTEMI and AKI. 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 Pt with hx of DM and HTN.    PT Comments    Pt received in bed, daughter present in room. Pt required min assist bed mobility, min assist transfers, and min to mod assist ambulation 50' with RW. Pt assisted to/from bathroom prior to ambulation in hallway. Pt supine in bed at end of session.    Recommendations for follow up therapy are one component of a multi-disciplinary discharge planning process, led by the attending physician.  Recommendations may be updated based on patient status, additional functional criteria and insurance authorization.  Follow Up Recommendations  Home health PT;Supervision/Assistance - 24 hour     Equipment Recommendations  Rolling walker with 5" wheels;3in1 (PT)    Recommendations for Other Services       Precautions / Restrictions Precautions Precautions: Fall     Mobility  Bed Mobility Overal bed mobility: Needs Assistance Bed Mobility: Supine to Sit;Sit to Supine     Supine to sit: Min assist;HOB elevated Sit to supine: Min assist;HOB elevated   General bed mobility comments: +rail, increased time, cues for sequencing    Transfers Overall transfer level: Needs assistance Equipment used: Rolling walker (2 wheeled) Transfers: Sit to/from Stand   Stand pivot transfers: Min assist       General transfer comment: cues for hand placement and sequencing, assist to power up  Ambulation/Gait Ambulation/Gait assistance: Min assist;Mod assist Gait Distance (Feet): 50 Feet Assistive device: Rolling walker (2 wheeled) Gait Pattern/deviations: Step-through pattern;Decreased stride length;Trunk  flexed Gait velocity: decreased Gait velocity interpretation: <1.8 ft/sec, indicate of risk for recurrent falls General Gait Details: assist with RW management due to pt pushing it too far in front and running into obstacles. Cues to stay in frame of RW.   Stairs             Wheelchair Mobility    Modified Rankin (Stroke Patients Only) Modified Rankin (Stroke Patients Only) Pre-Morbid Rankin Score: No significant disability Modified Rankin: Moderately severe disability     Balance Overall balance assessment: Needs assistance Sitting-balance support: No upper extremity supported;Feet supported Sitting balance-Leahy Scale: Fair     Standing balance support: Bilateral upper extremity supported;During functional activity Standing balance-Leahy Scale: Poor Standing balance comment: reliant on external assistance.                            Cognition Arousal/Alertness: Awake/alert Behavior During Therapy: WFL for tasks assessed/performed Overall Cognitive Status: Impaired/Different from baseline Area of Impairment: Orientation;Attention;Memory;Following commands;Safety/judgement;Awareness;Problem solving                 Orientation Level: Disoriented to;Time;Place;Situation Current Attention Level: Sustained Memory: Decreased short-term memory Following Commands: Follows one step commands inconsistently Safety/Judgement: Decreased awareness of deficits Awareness: Emergent Problem Solving: Slow processing;Decreased initiation;Difficulty sequencing;Requires verbal cues;Requires tactile cues General Comments: Daughter present to confirm cognition.      Exercises      General Comments        Pertinent Vitals/Pain Pain Assessment: Faces Faces Pain Scale: Hurts little more Pain Location: buttocks (redness) Pain Descriptors / Indicators: Tender;Discomfort Pain Intervention(s): Monitored during session;Repositioned    Home Living  Prior Function            PT Goals (current goals can now be found in the care plan section) Acute Rehab PT Goals Patient Stated Goal: go home with daughter Progress towards PT goals: Progressing toward goals    Frequency    Min 4X/week      PT Plan Current plan remains appropriate    Co-evaluation              AM-PAC PT "6 Clicks" Mobility   Outcome Measure  Help needed turning from your back to your side while in a flat bed without using bedrails?: A Little Help needed moving from lying on your back to sitting on the side of a flat bed without using bedrails?: A Little Help needed moving to and from a bed to a chair (including a wheelchair)?: A Little Help needed standing up from a chair using your arms (e.g., wheelchair or bedside chair)?: A Little Help needed to walk in hospital room?: A Lot Help needed climbing 3-5 steps with a railing? : A Lot 6 Click Score: 16    End of Session Equipment Utilized During Treatment: Gait belt Activity Tolerance: Patient tolerated treatment well Patient left: in bed;with call bell/phone within reach;with family/visitor present Nurse Communication: Mobility status PT Visit Diagnosis: Unsteadiness on feet (R26.81);Muscle weakness (generalized) (M62.81)     Time: 3557-3220 PT Time Calculation (min) (ACUTE ONLY): 27 min  Charges:  $Gait Training: 23-37 mins                     [name], [name]  Office # [phone] Pager [phone]    [name] 08/31/2021, 11:04 AM

## 2021-08-31 NOTE — TOC Progression Note (Addendum)
Transition of Care [facility]) - Progression Note    Patient Details  Name: Sonya Duffy MRN: [id number] Date of Birth: 1946-05-10  Transition of Care [facility]) CM/SW [place], RN Phone Number: 08/31/2021, 4:41 PM  Clinical Narrative:     Patient in with CVA, Hyperglycemia  2/4 bottles  positive blood cultures.  Patient will be going home with daughter to [address], [place], [place] [address] She does not have any DME, and would like full home health to help her mother. She is also taking care of her special needs brother ( who lives with her mother currently) She is very addiment that her mother will not go to SNF , PT assessment reveals HH recommendation. . DME ordered, Messaged MD about Home Health.  Called for DME to be drop shipped to daughters house.  She states she does have a PCP, Dr Dory Horn [phone]  Expected Discharge Plan: [place] Barriers to Discharge: Continued Medical Work up  Expected Discharge Plan and Services Expected Discharge Plan: [place]   Discharge Planning Services: CM Consult   Living arrangements for the past 2 months: Single Family Home                                       Social Determinants of Health (SDOH) Interventions    Readmission Risk Interventions No flowsheet data found.

## 2021-08-31 NOTE — Progress Notes (Signed)
Speech Language Pathology Treatment:    Patient Details Name: Sonya Duffy MRN: [id number] DOB: 1946/04/25 Today's Date: 08/31/2021 Time: 1011-1026 SLP Time Calculation (min) (ACUTE ONLY): 15 min  Assessment / Plan / Recommendation Clinical Impression  Pt was seen for dysphagia treatment with her daughter present. Pt's daughter reported that the pt has been tolerating the current diet without difficulty, but that she has been hesitant to give her hard candies since she sometimes has difficulty with those. Pt tolerated thin liquids and regular texture solids without overt s/sx of aspiration. Mastication was prolonged with regular texture solids and pt exhibited difficulty with bolus formation. Pt demonstrated anterior loss of regular texture boluses during mastication, and she reported oral soreness during mastication. Mild to moderate oral residue was noted and cleared with a liquid wash. Pt's current diet of dysphagia 3 solids and thin liquids Duffy be continued. SLP Duffy continue to follow pt.    HPI HPI: Sonya Duffy is a 75 y.o. 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 With past medical history significant for insulin-dependent diabetes and hypertension, intellectual delay/illiteracy. The patient and her daughter notes that the patient's cat has been staying outside of the house recently and refuses to come back inside the house which led the patient to be sad and stopped taking her insulin.  In that setting she began to have intermittent nausea and vomiting as well as diarrhea, generalized weakness, slurred speech that was progressive.      SLP Plan  Continue with current plan of care      Recommendations for follow up therapy are one component of a multi-disciplinary discharge planning process, led by the attending physician.  Recommendations may be updated based on patient status, additional functional criteria and insurance authorization.     Recommendations  Diet recommendations: Dysphagia 3 (mechanical soft);Thin liquid Liquids provided via: Cup;Straw Medication Administration: Whole meds with puree (whole with puree or with thin liquids as tolerated) Supervision: Staff to assist with self feeding Compensations: Small sips/bites Postural Changes and/or Swallow Maneuvers: Seated upright 90 degrees                Follow up Recommendations: 24 hour supervision/assistance SLP Visit Diagnosis: Dysphagia, unspecified (R13.10) Plan: Continue with current plan of care       [name] I. Hardin Negus, [place], [place] Office number [phone] Pager [place]  08/31/2021, 12:44 PM

## 2021-08-31 NOTE — Progress Notes (Signed)
Progress Note    Sonya Duffy  FBP:[id number] DOB: 1946-10-06  DOA: 08/27/2021 PCP: Pcp, No    Brief Narrative:     Medical records reviewed and are as summarized below:  Sonya Duffy is an 75 y.o. female patient was found to be in DKA.  CT head was done which showed chronic appearing right cerebellar infarct, area of extra axial increase attenuation within the right parietal region which may represent partially calcified meningioma.  Neurology was consulted.  MRI brain showed subacute infarct of left paracentral pons, associated microhemorrhage but no malignant hemorrhagic transformation or mass-effect.  Blood cultures positive for MSSA.    Assessment/Plan:   Principal Problem:   DKA (diabetic ketoacidosis) ([place]) Active Problems:   Cerebral thrombosis with cerebral infarction   Non-ST elevation (NSTEMI) myocardial infarction (HCC)   MSSA bacteremia   Fever- blood culture + for MSSA -unclear source but she does complain of back pain so may need imaging-- defer to ID -U/A negative X ray negative -abx started -repeat BC  Diabetic ketoacidosis -Resolved -lantus/SSI   Acute Stroke -MRI brain showed left pontine infarct -Neurology consulted -2D echo: Left ventricular ejection fraction, by estimation, is 50 to 55%. The  left ventricle has low normal function. The left ventricle demonstrates  regional wall motion abnormalities  -Hemoglobin A1c 13.3 -Neurology recommends aspirin and Plavix for 3 weeks and then continue aspirin alone  NSTEMI -Troponin elevated at 653; 657; 967; 1001 -Cardiology consulted:  Repeat limited echo shows apical akinesis without thrombus (Contrast used). Suspect she had a distal LAD infarct. Probably no need to consider cath in the future - could consider myoview to look for any reversible ischemia as an outpatient. Agree with DAPT per neuro - technically, since this was NSTEMI - there is evidence for mortality benefit with 1 year of DAPT  with Plavix for medical therapy of NSTEMI. Would consider longer course.     Metabolic encephalopathy -Multifactorial, likely from diabetic ketoacidosis, stroke, NSTEMI -We will continue to monitor   Partially calcified meningioma -Seen on CT head and MRI brain -No mass-effect seen, no intervention recommended   Acute kidney injury -Likely from poor p.o. intake -Creatinine was 2.38 on admission -Improved with IVF   B12 deficiency -B12 was found to be 147 -Started on 1000 mcg IM daily for 7 days followed by 1000 mcg p.o. daily   Hypokalemia -replete     Family Communication/Anticipated D/C date and plan/Code Status   DVT prophylaxis: Lovenox ordered. Code Status: Full Code.  Family Communication: at bedside Disposition Plan: Status is: Inpatient  Remains inpatient appropriate because:Inpatient level of care appropriate due to severity of illness  Dispo: The patient is from: Home              Anticipated d/c is to: home              Patient currently is not medically stable to d/c.   Difficult to place patient No         Medical Consultants:   Neuro Cards ID (automatic consult)  Subjective:   No overnight events  Objective:    Vitals:   08/30/21 2308 08/31/21 0400 08/31/21 0805 08/31/21 1159  BP: (!) 136/111 (!) 125/52 (!) 150/73   Pulse: 71 80    Resp: 20 (!) 22    Temp: 99.1 F (37.3 C) 98.6 F (37 C) 98.3 F (36.8 C) 98.4 F (36.9 C)  TempSrc: Axillary Axillary Axillary Axillary  SpO2: 95% 97%  Weight:      Height:        Intake/Output Summary (Last 24 hours) at 08/31/2021 1319 Last data filed at 08/30/2021 1646 Gross per 24 hour  Intake 120 ml  Output --  Net 120 ml   Filed Weights   08/27/21 1557  Weight: 69.9 kg    Exam:  General: Appearance:     Overweight female in no acute distress     Lungs:      respirations unlabored  Heart:    Normal heart rate.    MS:   All extremities are intact.    Neurologic:   Awake, alert          Data Reviewed:   I have personally reviewed following labs and imaging studies:  Labs: Labs show the following:   Basic Metabolic Panel: Recent Labs  Lab 08/28/21 1231 08/28/21 1919 08/28/21 2341 08/29/21 0035 08/30/21 0448 08/31/21 0721  NA 137 141 140 138 141  --   K 3.4* 3.4* 4.2 3.7 3.0*  --   CL 108 112* 110 108 107  --   CO2 19* 20* 20* 17* 23  --   GLUCOSE 133* 158* 219* 217* 141*  --   BUN 22 18 19 17 14   --   CREATININE 1.43* 1.44* 1.45* 1.48* 1.39*  --   CALCIUM 9.1 9.6 9.6 9.5 9.7  --   MG  --   --   --   --  1.1* 1.4*   GFR Estimated Creatinine Clearance: 31 mL/min (A) (by C-G formula based on SCr of 1.39 mg/dL (H)). Liver Function Tests: Recent Labs  Lab 08/27/21 1313  AST 14*  ALT 14  ALKPHOS 208*  BILITOT 1.8*  PROT 8.0  ALBUMIN 3.0*   No results for input(s): LIPASE, AMYLASE in the last 168 hours. Recent Labs  Lab 08/28/21 0451  AMMONIA 20   Coagulation profile Recent Labs  Lab 08/27/21 1313  INR 1.2    CBC: Recent Labs  Lab 08/27/21 1313 08/27/21 1336  WBC 15.5*  --   NEUTROABS 11.9*  --   HGB 12.5 13.9  HCT 40.0 41.0  MCV 89.1  --   PLT 406*  --    Cardiac Enzymes: Recent Labs  Lab 08/29/21 0035  CKTOTAL 51   BNP (last 3 results) No results for input(s): PROBNP in the last 8760 hours. CBG: Recent Labs  Lab 08/30/21 2123 08/31/21 0220 08/31/21 0452 08/31/21 0807 08/31/21 1201  GLUCAP 221* 96 160* 150* 263*   D-Dimer: No results for input(s): DDIMER in the last 72 hours. Hgb A1c: No results for input(s): HGBA1C in the last 72 hours.  Lipid Profile: No results for input(s): CHOL, HDL, LDLCALC, TRIG, CHOLHDL, LDLDIRECT in the last 72 hours.  Thyroid function studies: No results for input(s): TSH, T4TOTAL, T3FREE, THYROIDAB in the last 72 hours.  Invalid input(s): FREET3  Anemia work up: No results for input(s): VITAMINB12, FOLATE, FERRITIN, TIBC, IRON, RETICCTPCT in the last 72 hours.  Sepsis  Labs: Recent Labs  Lab 08/27/21 1313  WBC 15.5*    Microbiology Recent Results (from the past 240 hour(s))  Resp Panel by RT-PCR (Flu A&B, Covid) Nasopharyngeal Swab     Status: None   Collection Time: 08/27/21  3:43 PM   Specimen: Nasopharyngeal Swab; Nasopharyngeal(NP) swabs in vial transport medium  Result Value Ref Range Status   SARS Coronavirus 2 by RT PCR NEGATIVE NEGATIVE Final    Comment: (NOTE) SARS-CoV-2 target nucleic acids are  NOT DETECTED.  The SARS-CoV-2 RNA is generally detectable in upper respiratory specimens during the acute phase of infection. The lowest concentration of SARS-CoV-2 viral copies this assay can detect is 138 copies/mL. A negative result does not preclude SARS-Cov-2 infection and should not be used as the sole basis for treatment or other patient management decisions. A negative result may occur with  improper specimen collection/handling, submission of specimen other than nasopharyngeal swab, presence of viral mutation(s) within the areas targeted by this assay, and inadequate number of viral copies(<138 copies/mL). A negative result must be combined with clinical observations, patient history, and epidemiological information. The expected result is Negative.  Fact Sheet for Patients:  EntrepreneurPulse.com.au  Fact Sheet for Healthcare Providers:  IncredibleEmployment.be  This test is no t yet approved or cleared by the [place] FDA and  has been authorized for detection and/or diagnosis of SARS-CoV-2 by FDA under an Emergency Use Authorization (EUA). This EUA will remain  in effect (meaning this test can be used) for the duration of the COVID-19 declaration under Section 564(b)(1) of the Act, 21 U.S.C.section 360bbb-3(b)(1), unless the authorization is terminated  or revoked sooner.       Influenza A by PCR NEGATIVE NEGATIVE Final   Influenza B by PCR NEGATIVE NEGATIVE Final    Comment:  (NOTE) The Xpert Xpress SARS-CoV-2/FLU/RSV plus assay is intended as an aid in the diagnosis of influenza from Nasopharyngeal swab specimens and should not be used as a sole basis for treatment. Nasal washings and aspirates are unacceptable for Xpert Xpress SARS-CoV-2/FLU/RSV testing.  Fact Sheet for Patients: EntrepreneurPulse.com.au  Fact Sheet for Healthcare Providers: IncredibleEmployment.be  This test is not yet approved or cleared by the [place] FDA and has been authorized for detection and/or diagnosis of SARS-CoV-2 by FDA under an Emergency Use Authorization (EUA). This EUA will remain in effect (meaning this test can be used) for the duration of the COVID-19 declaration under Section 564(b)(1) of the Act, 21 U.S.C. section 360bbb-3(b)(1), unless the authorization is terminated or revoked.  Performed at [facility], [place] [address], [place], [place] [address]   Culture, blood (Routine X 2) w Reflex to ID Panel     Status: None (Preliminary result)   Collection Time: 08/28/21 11:41 PM   Specimen: BLOOD  Result Value Ref Range Status   Specimen Description BLOOD LEFT ANTECUBITAL  Final   Special Requests AEROBIC BOTTLE ONLY Blood Culture adequate volume  Final   Culture   Final    NO GROWTH 1 DAY Performed at [facility], [place] [address], [place], [place] [address]    Report Status PENDING  Incomplete  Culture, blood (Routine X 2) w Reflex to ID Panel     Status: Abnormal   Collection Time: 08/28/21 11:42 PM   Specimen: BLOOD LEFT FOREARM  Result Value Ref Range Status   Specimen Description BLOOD LEFT FOREARM  Final   Special Requests   Final    BOTTLES DRAWN AEROBIC AND ANAEROBIC Blood Culture adequate volume   Culture  Setup Time   Final    GRAM POSITIVE COCCI IN CLUSTERS IN BOTH AEROBIC AND ANAEROBIC BOTTLES CRITICAL RESULT CALLED TO, READ BACK BY AND VERIFIED WITH: Burnett Kanaris [id number] AT 1831 BY  CM Performed at [facility], [place] [address] [place], [place] [address]    Culture STAPHYLOCOCCUS AUREUS (A)  Final   Report Status 08/31/2021 FINAL  Final   Organism ID, Bacteria STAPHYLOCOCCUS AUREUS  Final      Susceptibility   Staphylococcus aureus - MIC*    CIPROFLOXACIN <=0.5 SENSITIVE Sensitive     ERYTHROMYCIN >=8 RESISTANT Resistant     GENTAMICIN <=0.5 SENSITIVE Sensitive     OXACILLIN <=0.25 SENSITIVE Sensitive     TETRACYCLINE <=1 SENSITIVE Sensitive     VANCOMYCIN 1 SENSITIVE Sensitive     TRIMETH/SULFA <=10 SENSITIVE Sensitive     CLINDAMYCIN <=0.25 SENSITIVE Sensitive     RIFAMPIN <=0.5 SENSITIVE Sensitive     Inducible Clindamycin NEGATIVE Sensitive     * STAPHYLOCOCCUS AUREUS  Blood Culture ID Panel (Reflexed)     Status: Abnormal   Collection Time: 08/28/21 11:42 PM  Result Value Ref Range Status   Enterococcus faecalis NOT DETECTED NOT DETECTED Final   Enterococcus Faecium NOT DETECTED NOT DETECTED Final   Listeria monocytogenes NOT DETECTED NOT DETECTED Final   Staphylococcus species DETECTED (A) NOT DETECTED Final    Comment: CRITICAL RESULT CALLED TO, READ BACK BY AND VERIFIED WITH: PHARMD G BARR [id number] AT 1830 BY CM    Staphylococcus aureus (BCID) DETECTED (A) NOT DETECTED Final    Comment: CRITICAL RESULT CALLED TO, READ BACK BY AND VERIFIED WITH: PHARMD G BARR [id number] AT 1830 BY CM    Staphylococcus epidermidis NOT DETECTED NOT DETECTED Final   Staphylococcus lugdunensis NOT DETECTED NOT DETECTED Final   Streptococcus species NOT DETECTED NOT DETECTED Final   Streptococcus agalactiae NOT DETECTED NOT DETECTED Final   Streptococcus pneumoniae NOT DETECTED NOT DETECTED Final   Streptococcus pyogenes NOT DETECTED NOT DETECTED Final   A.calcoaceticus-baumannii NOT DETECTED NOT DETECTED Final   Bacteroides fragilis NOT DETECTED NOT DETECTED Final   Enterobacterales NOT DETECTED NOT DETECTED Final   Enterobacter cloacae complex NOT DETECTED NOT  DETECTED Final   Escherichia coli NOT DETECTED NOT DETECTED Final   Klebsiella aerogenes NOT DETECTED NOT DETECTED Final   Klebsiella oxytoca NOT DETECTED NOT DETECTED Final   Klebsiella pneumoniae NOT DETECTED NOT DETECTED Final   Proteus species NOT DETECTED NOT DETECTED Final   Salmonella species NOT DETECTED NOT DETECTED Final   Serratia marcescens NOT DETECTED NOT DETECTED Final   Haemophilus influenzae NOT DETECTED NOT DETECTED Final   Neisseria meningitidis NOT DETECTED NOT DETECTED Final   Pseudomonas aeruginosa NOT DETECTED NOT DETECTED Final   Stenotrophomonas maltophilia NOT DETECTED NOT DETECTED Final   Candida albicans NOT DETECTED NOT DETECTED Final   Candida auris NOT DETECTED NOT DETECTED Final   Candida glabrata NOT DETECTED NOT DETECTED Final   Candida krusei NOT DETECTED NOT DETECTED Final   Candida parapsilosis NOT DETECTED NOT DETECTED Final   Candida tropicalis NOT DETECTED NOT DETECTED Final   Cryptococcus neoformans/gattii NOT DETECTED NOT DETECTED Final   Meth resistant mecA/C and MREJ NOT DETECTED NOT DETECTED Final    Comment: Performed at [facility], [address] [place], [place] [address]    Procedures and diagnostic studies:  ECHOCARDIOGRAM COMPLETE  Result Date: 08/30/2021    ECHOCARDIOGRAM REPORT   Patient Name:   SAHIRA CATALDI Date of Exam: 08/29/2021 Medical Rec #:  [id number]       Height:       62.0 in Accession #:    [id number]      Weight:       154.0 lb Date of Birth:  17-Aug-1946       BSA:          80.711 m Patient Age:    3 years  BP:           150/82 mmHg Patient Gender: F               HR:           89 bpm. Exam Location:  Inpatient Procedure: 2D Echo, Cardiac Doppler and Color Doppler Indications:    CVA  History:        Patient has no prior history of Echocardiogram examinations.  Sonographer:    Tawnya Crook Referring Phys: [id number]  Sonographer Comments: Definity contrast not attempted. IMPRESSIONS  1. Left  ventricular ejection fraction, by estimation, is 55 to 60%. The left ventricle has normal function. The left ventricle demonstrates regional wall motion abnormalities (see scoring diagram/findings for description). There is moderate asymmetric left ventricular hypertrophy of the basal-septal segment. Indeterminate diastolic filling due to E-A fusion.  2. Right ventricular systolic function is normal. The right ventricular size is normal. Tricuspid regurgitation signal is inadequate for assessing PA pressure.  3. The mitral valve is normal in structure. Trivial mitral valve regurgitation. No evidence of mitral stenosis.  4. The aortic valve is grossly normal. Aortic valve regurgitation is not visualized. No aortic stenosis is present.  5. The inferior vena cava is normal in size with greater than 50% respiratory variability, suggesting right atrial pressure of 3 mmHg. Conclusion(s)/Recommendation(s): No intracardiac source of embolism detected on this transthoracic study. A transesophageal echocardiogram is recommended to exclude cardiac source of embolism if clinically indicated. Recommend limited echo with Definity contrast since LV apex appears hypokinetic. Cannot evaluate for LV apical thrombus based on this exam. FINDINGS  Left Ventricle: Left ventricular ejection fraction, by estimation, is 55 to 60%. The left ventricle has normal function. The left ventricle demonstrates regional wall motion abnormalities. The left ventricular internal cavity size was normal in size. There is moderate asymmetric left ventricular hypertrophy of the basal-septal segment. Indeterminate diastolic filling due to E-A fusion.  LV Wall Scoring: The apical lateral segment, apical septal segment, apical anterior segment, and apical inferior segment are hypokinetic. Right Ventricle: The right ventricular size is normal. No increase in right ventricular wall thickness. Right ventricular systolic function is normal. Tricuspid regurgitation  signal is inadequate for assessing PA pressure. The tricuspid regurgitant velocity is 1.75 m/s, and with an assumed right atrial pressure of 3 mmHg, the estimated right ventricular systolic pressure is 45.4 mmHg. Left Atrium: Left atrial size was normal in size. Right Atrium: Right atrial size was normal in size. Pericardium: There is no evidence of pericardial effusion. Mitral Valve: The mitral valve is normal in structure. Trivial mitral valve regurgitation. No evidence of mitral valve stenosis. Tricuspid Valve: The tricuspid valve is normal in structure. Tricuspid valve regurgitation is trivial. No evidence of tricuspid stenosis. Aortic Valve: The aortic valve is grossly normal. Aortic valve regurgitation is not visualized. No aortic stenosis is present. Aortic valve mean gradient measures 5.0 mmHg. Aortic valve peak gradient measures 7.6 mmHg. Aortic valve area, by VTI measures 2.06 cm. Pulmonic Valve: The pulmonic valve was grossly normal. Pulmonic valve regurgitation is trivial. No evidence of pulmonic stenosis. Aorta: The aortic root is normal in size and structure and the ascending aorta was not well visualized. Venous: The inferior vena cava is normal in size with greater than 50% respiratory variability, suggesting right atrial pressure of 3 mmHg. IAS/Shunts: No atrial level shunt detected by color flow Doppler.  LEFT VENTRICLE PLAX 2D LVIDd:         3.20 cm  Diastology LVIDs:         2.10 cm  LV e' medial:    5.77 cm/s LV PW:         1.10 cm  LV E/e' medial:  8.2 LV IVS:        1.40 cm  LV e' lateral:   6.09 cm/s LVOT diam:     1.80 cm  LV E/e' lateral: 7.8 LV SV:         52 LV SV Index:   30 LVOT Area:     2.54 cm  RIGHT VENTRICLE             IVC RV S prime:     10.00 cm/s  IVC diam: 1.40 cm TAPSE (M-mode): 1.9 cm LEFT ATRIUM           Index       RIGHT ATRIUM          Index LA diam:      2.60 cm 1.52 cm/m  RA Area:     8.61 cm LA Vol (A2C): 18.4 ml 10.76 ml/m RA Volume:   17.20 ml 10.05 ml/m LA  Vol (A4C): 21.4 ml 12.51 ml/m  AORTIC VALVE                    PULMONIC VALVE AV Area (Vmax):    1.94 cm     PV Vmax:       1.29 m/s AV Area (Vmean):   1.86 cm     PV Peak grad:  6.7 mmHg AV Area (VTI):     2.06 cm AV Vmax:           138.00 cm/s AV Vmean:          107.000 cm/s AV VTI:            0.251 m AV Peak Grad:      7.6 mmHg AV Mean Grad:      5.0 mmHg LVOT Vmax:         105.00 cm/s LVOT Vmean:        78.100 cm/s LVOT VTI:          0.203 m LVOT/AV VTI ratio: 0.81  AORTA Ao Root diam: 3.30 cm MITRAL VALVE               TRICUSPID VALVE MV Area (PHT): 5.37 cm    TR Peak grad:   12.2 mmHg MV E velocity: 47.60 cm/s  TR Vmax:        175.00 cm/s MV A velocity: 95.50 cm/s MV E/A ratio:  0.50        SHUNTS                            Systemic VTI:  0.20 m                            Systemic Diam: 1.80 cm Cherlynn Kaiser MD Electronically signed by Cherlynn Kaiser MD Signature Date/Time: 08/30/2021/1:31:27 AM    Final    ECHOCARDIOGRAM LIMITED  Result Date: 08/30/2021    ECHOCARDIOGRAM LIMITED REPORT   Patient Name:   Sonya Duffy Date of Exam: 08/30/2021 Medical Rec #:  [id number]       Height:       62.0 in Accession #:    [id number]      Weight:       154.0  lb Date of Birth:  June 14, 1946       BSA:          74.711 m Patient Age:    70 years        BP:           122/48 mmHg Patient Gender: F               HR:           88 bpm. Exam Location:  Inpatient Procedure: Limited Echo and Intracardiac Opacification Agent Indications:    Rule out thrombus  History:        Patient has prior history of Echocardiogram examinations, most                 recent 08/29/2021. Stroke.  Sonographer:    Merrie Roof RDCS Referring Phys: [place]  1. Limited study with definity to R/O apical thrombus; apical akinesis with overall low normal LV function; no thrombus noted.  2. Left ventricular ejection fraction, by estimation, is 50 to 55%. The left ventricle has low normal function. The left ventricle  demonstrates regional wall motion abnormalities (see scoring diagram/findings for description).  3. Right ventricular systolic function is normal. The right ventricular size is normal. FINDINGS  Left Ventricle: Left ventricular ejection fraction, by estimation, is 50 to 55%. The left ventricle has low normal function. The left ventricle demonstrates regional wall motion abnormalities. Definity contrast agent was given IV to delineate the left ventricular endocardial borders. Right Ventricle: The right ventricular size is normal. Right ventricular systolic function is normal. Left Atrium: Left atrial size was normal in size. Right Atrium: Right atrial size was normal in size. Pericardium: There is no evidence of pericardial effusion. Additional Comments: Limited study with definity to R/O apical thrombus; apical akinesis with overall low normal LV function; no thrombus noted. Kirk Ruths MD Electronically signed by Kirk Ruths MD Signature Date/Time: 08/30/2021/6:12:08 PM    Final     Medications:    aspirin  325 mg Oral Daily   atorvastatin  40 mg Oral Daily   clopidogrel  75 mg Oral Daily   conjugated estrogens  1 Applicatorful Vaginal QHS   cyanocobalamin  1,000 mcg Intramuscular Daily   Followed by   Derrill Memo ON 09/04/2021] vitamin B-12  1,000 mcg Oral Daily   enoxaparin (LOVENOX) injection  40 mg Subcutaneous QHS   insulin aspart  0-15 Units Subcutaneous TID AC & HS   insulin glargine-yfgn  25 Units Subcutaneous Daily   melatonin  5 mg Oral Once   metoprolol tartrate  25 mg Oral BID   Continuous Infusions:   ceFAZolin (ANCEF) IV 2 g (08/31/21 1314)   magnesium sulfate bolus IVPB 2 g (08/31/21 1312)     LOS: 4 days   Geradine Girt  [facility]   How to contact the [facility] Attending or Consulting provider [place] or covering provider during after hours [place], for this patient?  Check the care team in [facility] and look for a) attending/consulting [facility] provider listed and b) the [facility]  team listed Log into [email or web address] and use [facility]'s universal password to access. If you do not have the password, please contact the hospital operator. Locate the [facility] provider you are looking for under [facility] and page to a number that you can be directly reached. If you still have difficulty reaching the provider, please page the [facility] (Director on Call) for the Hospitalists listed on amion  for assistance.  08/31/2021, 1:19 PM

## 2021-08-31 NOTE — Evaluation (Signed)
Speech Language Pathology Evaluation Patient Details Name: Sonya Duffy MRN: [id number] DOB: 1946/05/11 Today's Date: 08/31/2021 Time: 7902-4097 SLP Time Calculation (min) (ACUTE ONLY): 19 min  Problem List:  Patient Active Problem List   Diagnosis Date Noted   MSSA bacteremia    Non-ST elevation (NSTEMI) myocardial infarction ([place]) 08/29/2021   Cerebral thrombosis with cerebral infarction 08/28/2021   DKA (diabetic ketoacidosis) ([place]) 08/27/2021   Past Medical History:  Past Medical History:  Diagnosis Date   Diabetes mellitus    Hypertension    Past Surgical History:  Past Surgical History:  Procedure Laterality Date   CHOLECYSTECTOMY     HPI:  Pt is a 75 y.o. female admitted on 08/27/21 and patient was found to be in DKA. Also with metabolic encephalopathy, NSTEMI and AKI. 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 PMH: insulin-dependent diabetes and hypertension, intellectual delay/illiteracy   Assessment / Plan / Recommendation Clinical Impression  Pt participated in speech/language/cognition evaluation with her granddaughter present. Pt's family has reported baseline deficits in memory and her daughter previously reported that she questioned early onset dementia. Per pt and her granddaughter, pt is "not educated" and has never been good with numbers or temporal orientation. Pt reported that her speech is slightly less clear, but she denied any other changes in speech, language or cognition. Pt's family reported acute changes in processing speed, speech, and cognition. She presented with mild dysarthria characterized by reduced articulatory precision and reduced vocal intensity which reduced speech intelligibility during conversation. Pt demonstrated cognitive-linguistic difficulty in the areas of awareness, attention, memory, problem solving, and executive function which family reported are worse than baseline. Impairments in working memory and attention  negatively impacted her ability to accurately follow three-step commands and respond to complex yes/no questions. She completed structured naming tasks without difficulty and no overt word retrieval difficulty was noted during limited conversation with the SLP. Skilled SLP services are clinically indicated at this time to improve motor speech and cognitive-linguistic function.    SLP Assessment  SLP Recommendation/Assessment: Patient needs continued Speech Lanaguage Pathology Services SLP Visit Diagnosis: Cognitive communication deficit (R41.841);Dysarthria and anarthria (R47.1)    Recommendations for follow up therapy are one component of a multi-disciplinary discharge planning process, led by the attending physician.  Recommendations may be updated based on patient status, additional functional criteria and insurance authorization.    Follow Up Recommendations  24 hour supervision/assistance;Home health SLP    Frequency and Duration min 2x/week  2 weeks      SLP Evaluation Cognition  Overall Cognitive Status: Impaired/Different from baseline Arousal/Alertness: Awake/alert Orientation Level: Oriented to person;Disoriented to time;Oriented to place Year:  (denied) Attention: Focused;Sustained Focused Attention: Impaired Focused Attention Impairment: Verbal complex Sustained Attention: Impaired Sustained Attention Impairment: Verbal complex Memory: Impaired Memory Impairment: Retrieval deficit;Decreased recall of new information (Immediate: 4/5; delayed: 0/5; with cues: 2/5) Awareness: Impaired Awareness Impairment: Emergent impairment Problem Solving: Impaired Problem Solving Impairment: Verbal complex Executive Function: Writer: Impaired Organizing Impairment: Verbal complex       Comprehension  Auditory Comprehension Overall Auditory Comprehension: Impaired Yes/No Questions: Impaired Basic Biographical Questions:  (5/5) Complex Questions:  (2/5) Commands:  Impaired One Step Basic Commands:  (4/4) Two Step Basic Commands:  (4/4) Multistep Basic Commands:  (0/3) Interfering Components: Hearing;Processing speed;Working Marine scientist;Attention    Expression Verbal Expression Overall Verbal Expression: Impaired Initiation: No impairment Automatic Speech: Day of week (WNL) Level of Generative/Spontaneous Verbalization: Conversation Repetition: Impaired Level of Impairment: Sentence level (3/5) Interfering Components: Attention;Speech  intelligibility   Oral / Motor  Motor Speech Overall Motor Speech: Impaired Respiration: Within functional limits Phonation: Low vocal intensity Resonance: Within functional limits Articulation: Impaired Level of Impairment: Sentence Intelligibility: Intelligibility reduced Word: 75-100% accurate Phrase: 75-100% accurate Sentence: 75-100% accurate Conversation: 50-74% accurate Motor Planning: Witnin functional limits Motor Speech Errors: Aware   Dent Plantz I. Hardin Negus, [place], [place] Office number [phone] Pager [place] 08/31/2021, 6:14 PM

## 2021-09-01 DIAGNOSIS — I214 Non-ST elevation (NSTEMI) myocardial infarction: Secondary | ICD-10-CM | POA: Diagnosis not present

## 2021-09-01 DIAGNOSIS — E0810 Diabetes mellitus due to underlying condition with ketoacidosis without coma: Secondary | ICD-10-CM | POA: Diagnosis not present

## 2021-09-01 LAB — CBC
HCT: 33.3 % — ABNORMAL LOW (ref 36.0–46.0)
Hemoglobin: 11.1 g/dL — ABNORMAL LOW (ref 12.0–15.0)
MCH: 28.2 pg (ref 26.0–34.0)
MCHC: 33.3 g/dL (ref 30.0–36.0)
MCV: 84.5 fL (ref 80.0–100.0)
Platelets: 338 10*3/uL (ref 150–400)
RBC: 3.94 MIL/uL (ref 3.87–5.11)
RDW: 14.6 % (ref 11.5–15.5)
WBC: 15.9 10*3/uL — ABNORMAL HIGH (ref 4.0–10.5)
nRBC: 0.1 % (ref 0.0–0.2)

## 2021-09-01 LAB — GLUCOSE, CAPILLARY
Glucose-Capillary: 121 mg/dL — ABNORMAL HIGH (ref 70–99)
Glucose-Capillary: 123 mg/dL — ABNORMAL HIGH (ref 70–99)
Glucose-Capillary: 150 mg/dL — ABNORMAL HIGH (ref 70–99)
Glucose-Capillary: 127 mg/dL — ABNORMAL HIGH (ref 70–99)

## 2021-09-01 LAB — BASIC METABOLIC PANEL
Anion gap: 10 (ref 5–15)
Anion gap: 13 (ref 5–15)
BUN: 14 mg/dL (ref 8–23)
BUN: 18 mg/dL (ref 8–23)
CO2: 25 mmol/L (ref 22–32)
CO2: 19 mmol/L — ABNORMAL LOW (ref 22–32)
Calcium: 9 mg/dL (ref 8.9–10.3)
Calcium: 8.9 mg/dL (ref 8.9–10.3)
Chloride: 103 mmol/L (ref 98–111)
Chloride: 106 mmol/L (ref 98–111)
Creatinine, Ser: 1.25 mg/dL — ABNORMAL HIGH (ref 0.44–1.00)
Creatinine, Ser: 1.14 mg/dL — ABNORMAL HIGH (ref 0.44–1.00)
GFR, Estimated: 44 mL/min — ABNORMAL LOW (ref >60)
GFR, Estimated: 50 mL/min — ABNORMAL LOW (ref >60)
Glucose, Bld: 89 mg/dL (ref 70–99)
Glucose, Bld: 133 mg/dL — ABNORMAL HIGH (ref 70–99)
Potassium: 2.7 mmol/L — CL (ref 3.5–5.1)
Potassium: 4.9 mmol/L (ref 3.5–5.1)
Sodium: 138 mmol/L (ref 135–145)
Sodium: 138 mmol/L (ref 135–145)

## 2021-09-01 LAB — URINALYSIS, ROUTINE W REFLEX MICROSCOPIC
Bacteria, UA: NONE SEEN
Bilirubin Urine: NEGATIVE
Glucose, UA: 50 mg/dL — AB
Ketones, ur: NEGATIVE mg/dL
Nitrite: NEGATIVE
Protein, ur: 30 mg/dL — AB
Specific Gravity, Urine: 1.012 (ref 1.005–1.030)
WBC, UA: >50 WBC/hpf — ABNORMAL HIGH (ref 0–5)
pH: 5 (ref 5.0–8.0)

## 2021-09-01 LAB — TROPONIN I (HIGH SENSITIVITY): Troponin I (High Sensitivity): 53 ng/L — ABNORMAL HIGH (ref <18)

## 2021-09-01 LAB — MAGNESIUM: Magnesium: 2.1 mg/dL (ref 1.7–2.4)

## 2021-09-01 MED ORDER — PANTOPRAZOLE SODIUM 40 MG PO TBEC
40.0000 mg | DELAYED_RELEASE_TABLET | Freq: Every day | ORAL | Status: DC
  Administered 2021-09-01 – 2021-09-05 (×5): 40 mg via ORAL
  Filled 2021-09-01 (×5): qty 1

## 2021-09-01 MED ORDER — POTASSIUM CHLORIDE CRYS ER 20 MEQ PO TBCR
40.0000 meq | EXTENDED_RELEASE_TABLET | ORAL | Status: AC
  Administered 2021-09-01 (×2): 40 meq via ORAL
  Filled 2021-09-01 (×2): qty 2

## 2021-09-01 MED ORDER — ONDANSETRON HCL 4 MG/2ML IJ SOLN
4.0000 mg | Freq: Four times a day (QID) | INTRAMUSCULAR | Status: DC | PRN
  Administered 2021-09-01 (×2): 4 mg via INTRAVENOUS
  Filled 2021-09-01 (×2): qty 2

## 2021-09-01 MED ORDER — NITROGLYCERIN 0.4 MG SL SUBL: 0.4000 mg | SUBLINGUAL_TABLET | SUBLINGUAL | Status: DC | PRN

## 2021-09-01 MED ORDER — POTASSIUM CHLORIDE CRYS ER 20 MEQ PO TBCR
40.0000 meq | EXTENDED_RELEASE_TABLET | ORAL | Status: AC
Start: 2021-09-01 — End: 2021-09-01
  Administered 2021-09-01 (×2): 40 meq via ORAL
  Filled 2021-09-01 (×2): qty 2

## 2021-09-01 MED ORDER — NYSTATIN 100000 UNIT/ML MT SUSP
5.0000 mL | Freq: Four times a day (QID) | OROMUCOSAL | Status: DC
  Administered 2021-09-01 – 2021-09-05 (×15): 500000 [IU] via ORAL
  Filled 2021-09-01 (×16): qty 5

## 2021-09-01 MED ORDER — FLUCONAZOLE 100MG IVPB
100.0000 mg | INTRAVENOUS | Status: DC
  Administered 2021-09-01: 100 mg via INTRAVENOUS
  Filled 2021-09-01 (×2): qty 50

## 2021-09-01 NOTE — Progress Notes (Addendum)
Progress Note    Sonya Duffy  IDP:[id number] DOB: August 18, 1946  DOA: 08/27/2021 PCP: Pcp, No    Brief Narrative:     Medical records reviewed and are as summarized below:  Sonya Duffy is an 75 y.o. female patient was found to be in DKA.  CT head was done which showed chronic appearing right cerebellar infarct, area of extra axial increase attenuation within the right parietal region which may represent partially calcified meningioma.  Neurology was consulted.  MRI brain showed subacute infarct of left paracentral pons, associated microhemorrhage but no malignant hemorrhagic transformation or mass-effect.  Blood cultures positive for MSSA.    Assessment/Plan:   Principal Problem:   DKA (diabetic ketoacidosis) ([place]) Active Problems:   Cerebral thrombosis with cerebral infarction   Non-ST elevation (NSTEMI) myocardial infarction ([facility])   MSSA bacteremia   Fever- blood culture + for MSSA -back pain resolved -U/A negative X ray negative -abx started -repeat BC  Hypokalemia/hypomagnesemia -replete  Diabetic ketoacidosis -Resolved -lantus/SSI   Acute Stroke -MRI brain showed left pontine infarct -Neurology consulted -2D echo: Left ventricular ejection fraction, by estimation, is 50 to 55%. The  left ventricle has low normal function. The left ventricle demonstrates  regional wall motion abnormalities  -Hemoglobin A1c 13.3 -Neurology recommends aspirin and Plavix for 3 weeks and then continue aspirin alone  NSTEMI -Troponin elevated at 653; 657; 967; 1001 -Cardiology consulted:  Repeat limited echo shows apical akinesis without thrombus (Contrast used). Suspect she had a distal LAD infarct. Probably no need to consider cath in the future - could consider myoview to look for any reversible ischemia as an outpatient. Agree with DAPT per neuro - technically, since this was NSTEMI - there is evidence for mortality benefit with 1 year of DAPT with Plavix for medical  therapy of NSTEMI. Would consider longer course.     Metabolic encephalopathy -Multifactorial, likely from diabetic ketoacidosis, stroke, NSTEMI -seems to be at baseline   Partially calcified meningioma -Seen on CT head and MRI brain -No mass-effect seen, no intervention recommended   Acute kidney injury -Likely from poor p.o. intake -Creatinine was 2.38 on admission -Improved   B12 deficiency -B12 was found to be 147 -Started on 1000 mcg IM daily for 7 days followed by 1000 mcg p.o. daily        Family Communication/Anticipated D/C date and plan/Code Status   DVT prophylaxis: Lovenox ordered. Code Status: Full Code.  Family Communication: at bedside Disposition Plan: Status is: Inpatient  Remains inpatient appropriate because:Inpatient level of care appropriate due to severity of illness  Dispo: The patient is from: Home              Anticipated d/c is to: home with daughter              Patient currently is not medically stable to d/c.   Difficult to place patient No         Medical Consultants:   Neuro Cards ID (automatic consult)  Subjective:  Needs to pee  Objective:    Vitals:   08/31/21 2359 09/01/21 0400 09/01/21 0743 09/01/21 1142  BP: 133/77 (!) 94/50 128/77 (!) 153/68  Pulse: 68 63 82 74  Resp: 16 15 16 15   Temp: 98.6 F (37 C) 98.6 F (37 C) 97.6 F (36.4 C) 98.2 F (36.8 C)  TempSrc: Axillary Axillary Oral Oral  SpO2: 98% 97% 95% 96%  Weight:      Height:  Intake/Output Summary (Last 24 hours) at 09/01/2021 1153 Last data filed at 09/01/2021 0957 Gross per 24 hour  Intake 360 ml  Output --  Net 360 ml   Filed Weights   08/27/21 1557  Weight: 69.9 kg    Exam:   General: Appearance:     Overweight female in no acute distress     Lungs:     respirations unlabored  Heart:    Normal heart rate.    MS:   All extremities are intact.    Neurologic:   Awake, alert           Data Reviewed:   I have  personally reviewed following labs and imaging studies:  Labs: Labs show the following:   Basic Metabolic Panel: Recent Labs  Lab 08/28/21 1919 08/28/21 2341 08/29/21 0035 08/30/21 0448 08/31/21 0721 09/01/21 0127  NA 141 140 138 141  --  138  K 3.4* 4.2 3.7 3.0*  --  2.7*  CL 112* 110 108 107  --  103  CO2 20* 20* 17* 23  --  25  GLUCOSE 158* 219* 217* 141*  --  89  BUN 18 19 17 14   --  14  CREATININE 1.44* 1.45* 1.48* 1.39*  --  1.25*  CALCIUM 9.6 9.6 9.5 9.7  --  9.0  MG  --   --   --  1.1* 1.4* 2.1   GFR Estimated Creatinine Clearance: 34.5 mL/min (A) (by C-G formula based on SCr of 1.25 mg/dL (H)). Liver Function Tests: Recent Labs  Lab 08/27/21 1313  AST 14*  ALT 14  ALKPHOS 208*  BILITOT 1.8*  PROT 8.0  ALBUMIN 3.0*   No results for input(s): LIPASE, AMYLASE in the last 168 hours. Recent Labs  Lab 08/28/21 0451  AMMONIA 20   Coagulation profile Recent Labs  Lab 08/27/21 1313  INR 1.2    CBC: Recent Labs  Lab 08/27/21 1313 08/27/21 1336 09/01/21 0127  WBC 15.5*  --  15.9*  NEUTROABS 11.9*  --   --   HGB 12.5 13.9 11.1*  HCT 40.0 41.0 33.3*  MCV 89.1  --  84.5  PLT 406*  --  338   Cardiac Enzymes: Recent Labs  Lab 08/29/21 0035  CKTOTAL 51   BNP (last 3 results) No results for input(s): PROBNP in the last 8760 hours. CBG: Recent Labs  Lab 08/31/21 1201 08/31/21 1654 08/31/21 1955 09/01/21 0741 09/01/21 1139  GLUCAP 263* 302* 209* 121* 123*   D-Dimer: No results for input(s): DDIMER in the last 72 hours. Hgb A1c: No results for input(s): HGBA1C in the last 72 hours.  Lipid Profile: No results for input(s): CHOL, HDL, LDLCALC, TRIG, CHOLHDL, LDLDIRECT in the last 72 hours.  Thyroid function studies: No results for input(s): TSH, T4TOTAL, T3FREE, THYROIDAB in the last 72 hours.  Invalid input(s): FREET3  Anemia work up: No results for input(s): VITAMINB12, FOLATE, FERRITIN, TIBC, IRON, RETICCTPCT in the last 72  hours.  Sepsis Labs: Recent Labs  Lab 08/27/21 1313 09/01/21 0127  WBC 15.5* 15.9*    Microbiology Recent Results (from the past 240 hour(s))  Resp Panel by RT-PCR (Flu A&B, Covid) Nasopharyngeal Swab     Status: None   Collection Time: 08/27/21  3:43 PM   Specimen: Nasopharyngeal Swab; Nasopharyngeal(NP) swabs in vial transport medium  Result Value Ref Range Status   SARS Coronavirus 2 by RT PCR NEGATIVE NEGATIVE Final    Comment: (NOTE) SARS-CoV-2 target nucleic acids are NOT  DETECTED.  The SARS-CoV-2 RNA is generally detectable in upper respiratory specimens during the acute phase of infection. The lowest concentration of SARS-CoV-2 viral copies this assay can detect is 138 copies/mL. A negative result does not preclude SARS-Cov-2 infection and should not be used as the sole basis for treatment or other patient management decisions. A negative result may occur with  improper specimen collection/handling, submission of specimen other than nasopharyngeal swab, presence of viral mutation(s) within the areas targeted by this assay, and inadequate number of viral copies(<138 copies/mL). A negative result must be combined with clinical observations, patient history, and epidemiological information. The expected result is Negative.  Fact Sheet for Patients:  EntrepreneurPulse.com.au  Fact Sheet for Healthcare Providers:  IncredibleEmployment.be  This test is no t yet approved or cleared by the [place] FDA and  has been authorized for detection and/or diagnosis of SARS-CoV-2 by FDA under an Emergency Use Authorization (EUA). This EUA will remain  in effect (meaning this test can be used) for the duration of the COVID-19 declaration under Section 564(b)(1) of the Act, 21 U.S.C.section 360bbb-3(b)(1), unless the authorization is terminated  or revoked sooner.       Influenza A by PCR NEGATIVE NEGATIVE Final   Influenza B by PCR  NEGATIVE NEGATIVE Final    Comment: (NOTE) The Xpert Xpress SARS-CoV-2/FLU/RSV plus assay is intended as an aid in the diagnosis of influenza from Nasopharyngeal swab specimens and should not be used as a sole basis for treatment. Nasal washings and aspirates are unacceptable for Xpert Xpress SARS-CoV-2/FLU/RSV testing.  Fact Sheet for Patients: EntrepreneurPulse.com.au  Fact Sheet for Healthcare Providers: IncredibleEmployment.be  This test is not yet approved or cleared by the [place] FDA and has been authorized for detection and/or diagnosis of SARS-CoV-2 by FDA under an Emergency Use Authorization (EUA). This EUA will remain in effect (meaning this test can be used) for the duration of the COVID-19 declaration under Section 564(b)(1) of the Act, 21 U.S.C. section 360bbb-3(b)(1), unless the authorization is terminated or revoked.  Performed at [facility], [place] [address], [place], [place] [address]   Culture, blood (Routine X 2) w Reflex to ID Panel     Status: None (Preliminary result)   Collection Time: 08/28/21 11:41 PM   Specimen: BLOOD  Result Value Ref Range Status   Specimen Description BLOOD LEFT ANTECUBITAL  Final   Special Requests AEROBIC BOTTLE ONLY Blood Culture adequate volume  Final   Culture   Final    NO GROWTH 3 DAYS Performed at [facility], [place] [address], [place], [place] [address]    Report Status PENDING  Incomplete  Culture, blood (Routine X 2) w Reflex to ID Panel     Status: Abnormal   Collection Time: 08/28/21 11:42 PM   Specimen: BLOOD LEFT FOREARM  Result Value Ref Range Status   Specimen Description BLOOD LEFT FOREARM  Final   Special Requests   Final    BOTTLES DRAWN AEROBIC AND ANAEROBIC Blood Culture adequate volume   Culture  Setup Time   Final    GRAM POSITIVE COCCI IN CLUSTERS IN BOTH AEROBIC AND ANAEROBIC BOTTLES CRITICAL RESULT CALLED TO, READ BACK BY AND VERIFIED WITH: Burnett Kanaris [id number] AT 1831 BY CM Performed at [facility], [place] [address], [place], [place] [address]    Culture STAPHYLOCOCCUS AUREUS (A)  Final   Report Status 08/31/2021 FINAL  Final   Organism ID, Bacteria STAPHYLOCOCCUS AUREUS  Final  Susceptibility   Staphylococcus aureus - MIC*    CIPROFLOXACIN <=0.5 SENSITIVE Sensitive     ERYTHROMYCIN >=8 RESISTANT Resistant     GENTAMICIN <=0.5 SENSITIVE Sensitive     OXACILLIN <=0.25 SENSITIVE Sensitive     TETRACYCLINE <=1 SENSITIVE Sensitive     VANCOMYCIN 1 SENSITIVE Sensitive     TRIMETH/SULFA <=10 SENSITIVE Sensitive     CLINDAMYCIN <=0.25 SENSITIVE Sensitive     RIFAMPIN <=0.5 SENSITIVE Sensitive     Inducible Clindamycin NEGATIVE Sensitive     * STAPHYLOCOCCUS AUREUS  Blood Culture ID Panel (Reflexed)     Status: Abnormal   Collection Time: 08/28/21 11:42 PM  Result Value Ref Range Status   Enterococcus faecalis NOT DETECTED NOT DETECTED Final   Enterococcus Faecium NOT DETECTED NOT DETECTED Final   Listeria monocytogenes NOT DETECTED NOT DETECTED Final   Staphylococcus species DETECTED (A) NOT DETECTED Final    Comment: CRITICAL RESULT CALLED TO, READ BACK BY AND VERIFIED WITH: PHARMD G BARR [id number] AT 1830 BY CM    Staphylococcus aureus (BCID) DETECTED (A) NOT DETECTED Final    Comment: CRITICAL RESULT CALLED TO, READ BACK BY AND VERIFIED WITH: PHARMD G BARR [id number] AT 1830 BY CM    Staphylococcus epidermidis NOT DETECTED NOT DETECTED Final   Staphylococcus lugdunensis NOT DETECTED NOT DETECTED Final   Streptococcus species NOT DETECTED NOT DETECTED Final   Streptococcus agalactiae NOT DETECTED NOT DETECTED Final   Streptococcus pneumoniae NOT DETECTED NOT DETECTED Final   Streptococcus pyogenes NOT DETECTED NOT DETECTED Final   A.calcoaceticus-baumannii NOT DETECTED NOT DETECTED Final   Bacteroides fragilis NOT DETECTED NOT DETECTED Final   Enterobacterales NOT DETECTED NOT DETECTED Final   Enterobacter cloacae  complex NOT DETECTED NOT DETECTED Final   Escherichia coli NOT DETECTED NOT DETECTED Final   Klebsiella aerogenes NOT DETECTED NOT DETECTED Final   Klebsiella oxytoca NOT DETECTED NOT DETECTED Final   Klebsiella pneumoniae NOT DETECTED NOT DETECTED Final   Proteus species NOT DETECTED NOT DETECTED Final   Salmonella species NOT DETECTED NOT DETECTED Final   Serratia marcescens NOT DETECTED NOT DETECTED Final   Haemophilus influenzae NOT DETECTED NOT DETECTED Final   Neisseria meningitidis NOT DETECTED NOT DETECTED Final   Pseudomonas aeruginosa NOT DETECTED NOT DETECTED Final   Stenotrophomonas maltophilia NOT DETECTED NOT DETECTED Final   Candida albicans NOT DETECTED NOT DETECTED Final   Candida auris NOT DETECTED NOT DETECTED Final   Candida glabrata NOT DETECTED NOT DETECTED Final   Candida krusei NOT DETECTED NOT DETECTED Final   Candida parapsilosis NOT DETECTED NOT DETECTED Final   Candida tropicalis NOT DETECTED NOT DETECTED Final   Cryptococcus neoformans/gattii NOT DETECTED NOT DETECTED Final   Meth resistant mecA/C and MREJ NOT DETECTED NOT DETECTED Final    Comment: Performed at [facility], [address], [place], [place] [address]  Culture, blood (routine x 2)     Status: None (Preliminary result)   Collection Time: 08/31/21  7:21 AM   Specimen: BLOOD  Result Value Ref Range Status   Specimen Description BLOOD LEFT ANTECUBITAL  Final   Special Requests   Final    BOTTLES DRAWN AEROBIC ONLY Blood Culture results may not be optimal due to an inadequate volume of blood received in culture bottles   Culture   Final    NO GROWTH < 24 HOURS Performed at [facility], [place] [address], [place], [place] [address]    Report Status PENDING  Incomplete  Culture, blood (routine x 2)     Status: None (Preliminary result)   Collection Time: 08/31/21  7:25 AM   Specimen: BLOOD LEFT HAND  Result Value Ref Range Status   Specimen Description BLOOD LEFT HAND  Final    Special Requests   Final    BOTTLES DRAWN AEROBIC ONLY Blood Culture results may not be optimal due to an inadequate volume of blood received in culture bottles   Culture   Final    NO GROWTH < 24 HOURS Performed at [facility], [place] [address] [place], [place] [address]    Report Status PENDING  Incomplete    Procedures and diagnostic studies:  ECHOCARDIOGRAM LIMITED  Result Date: 08/30/2021    ECHOCARDIOGRAM LIMITED REPORT   Patient Name:   HADLEIGH FELBER Date of Exam: 08/30/2021 Medical Rec #:  [id number]       Height:       62.0 in Accession #:    [id number]      Weight:       154.0 lb Date of Birth:  1946/08/21       BSA:          77.711 m Patient Age:    34 years        BP:           122/48 mmHg Patient Gender: F               HR:           88 bpm. Exam Location:  Inpatient Procedure: Limited Echo and Intracardiac Opacification Agent Indications:    Rule out thrombus  History:        Patient has prior history of Echocardiogram examinations, most                 recent 08/29/2021. Stroke.  Sonographer:    Merrie Roof RDCS Referring Phys: [place]  1. Limited study with definity to R/O apical thrombus; apical akinesis with overall low normal LV function; no thrombus noted.  2. Left ventricular ejection fraction, by estimation, is 50 to 55%. The left ventricle has low normal function. The left ventricle demonstrates regional wall motion abnormalities (see scoring diagram/findings for description).  3. Right ventricular systolic function is normal. The right ventricular size is normal. FINDINGS  Left Ventricle: Left ventricular ejection fraction, by estimation, is 50 to 55%. The left ventricle has low normal function. The left ventricle demonstrates regional wall motion abnormalities. Definity contrast agent was given IV to delineate the left ventricular endocardial borders. Right Ventricle: The right ventricular size is normal. Right ventricular systolic function is  normal. Left Atrium: Left atrial size was normal in size. Right Atrium: Right atrial size was normal in size. Pericardium: There is no evidence of pericardial effusion. Additional Comments: Limited study with definity to R/O apical thrombus; apical akinesis with overall low normal LV function; no thrombus noted. Kirk Ruths MD Electronically signed by Kirk Ruths MD Signature Date/Time: 08/30/2021/6:12:08 PM    Final     Medications:    aspirin  325 mg Oral Daily   atorvastatin  40 mg Oral Daily   clopidogrel  75 mg Oral Daily   conjugated estrogens  1 Applicatorful Vaginal QHS   cyanocobalamin  1,000 mcg Intramuscular Daily   Followed by   Derrill Memo ON 09/04/2021] vitamin B-12  1,000 mcg Oral Daily   enoxaparin (LOVENOX) injection  40 mg Subcutaneous QHS   insulin aspart  0-15 Units  Subcutaneous TID AC & HS   insulin glargine-yfgn  25 Units Subcutaneous Daily   metoprolol tartrate  25 mg Oral BID   potassium chloride  40 mEq Oral Q4H   Continuous Infusions:   ceFAZolin (ANCEF) IV 2 g (09/01/21 0528)     LOS: 5 days   Geradine Girt  [facility]   How to contact the [facility] Attending or Consulting provider [place] or covering provider during after hours [place], for this patient?  Check the care team in [facility] and look for a) attending/consulting [facility] provider listed and b) the [facility] team listed Log into [email or web address] and use [place]'s universal password to access. If you do not have the password, please contact the hospital operator. Locate the [facility] provider you are looking for under [facility] and page to a number that you can be directly reached. If you still have difficulty reaching the provider, please page the [facility] (Director on Call) for the Hospitalists listed on amion for assistance.  09/01/2021, 11:53 AM

## 2021-09-01 NOTE — Care Management (Cosign Needed)
The patient r, who is admitted with CVA, sepsis, requires a hospital bed and hoyer lift due to the need for frequent turns, reapportioning, and elevation of the head of the bed to prevent aspiration risk.

## 2021-09-01 NOTE — Care Management (Cosign Needed)
    Durable Medical Equipment  (From admission, onward)           Start     Ordered   09/01/21 1651  For home use only DME Hospital bed  Once       Question Answer Comment  Length of Need Lifetime   The above medical condition requires: Patient requires the ability to reposition frequently   Head must be elevated greater than: 30 degrees   Bed type Semi-electric   Hoyer Lift Yes   Support Surface: Gel Overlay      09/01/21 1651   08/31/21 1650  For home use only DME 3 n 1  Once        08/31/21 1650   08/31/21 1649  For home use only DME Walker rolling  Once       Question Answer Comment  Walker: With [place]   Patient needs a walker to treat with the following condition Weakness      08/31/21 1650

## 2021-09-01 NOTE — Progress Notes (Signed)
Patient c/o indigestion among other complaints (bladder pressure, N/V) -will check U/A -EKG/troponin -nitro PRN  She appears to have thrush so will also treat with diflucan. Eulogio Bear DO

## 2021-09-01 NOTE — Progress Notes (Signed)
Progress Note  Patient Name: Sonya Duffy Date of Encounter: 09/01/2021  [facility] Cardiologist: Pixie Casino, MD   Subjective   No chest pain or dyspnea some aphasia  Inpatient Medications    Scheduled Meds:  aspirin  325 mg Oral Daily   atorvastatin  40 mg Oral Daily   clopidogrel  75 mg Oral Daily   conjugated estrogens  1 Applicatorful Vaginal QHS   cyanocobalamin  1,000 mcg Intramuscular Daily   Followed by   Derrill Memo ON 09/04/2021] vitamin B-12  1,000 mcg Oral Daily   enoxaparin (LOVENOX) injection  40 mg Subcutaneous QHS   insulin aspart  0-15 Units Subcutaneous TID AC & HS   insulin glargine-yfgn  25 Units Subcutaneous Daily   metoprolol tartrate  25 mg Oral BID   potassium chloride  40 mEq Oral Q4H   Continuous Infusions:   ceFAZolin (ANCEF) IV 2 g (09/01/21 0528)   PRN Meds: acetaminophen, alum & mag hydroxide-simeth, dextrose, lidocaine   Vital Signs    Vitals:   08/31/21 1957 08/31/21 2359 09/01/21 0400 09/01/21 0743  BP: (!) 128/59 133/77 (!) 94/50 128/77  Pulse: 75 68 63 82  Resp: 18 16 15 16   Temp: 98.6 F (37 C) 98.6 F (37 C) 98.6 F (37 C) 97.6 F (36.4 C)  TempSrc: Axillary Axillary Axillary Oral  SpO2: 98% 98% 97% 95%  Weight:      Height:       No intake or output data in the 24 hours ending 09/01/21 0956  Last 3 Weights 08/27/2021  Weight (lbs) 154 lb  Weight (kg) 69.854 kg      Telemetry    Sinus rhythm HR 60-80s, no PVCs - Personally Reviewed  ECG    No new tracings - Personally Reviewed  Physical Exam   Affect appropriate Elderly white female  HEENT: normal Neck supple with no adenopathy JVP normal no bruits no thyromegaly Lungs clear with no wheezing and good diaphragmatic motion Heart:  S1/S2 no murmur, no rub, gallop or click PMI normal Abdomen: benighn, BS positve, no tenderness, no AAA no bruit.  No HSM or HJR Distal pulses intact with no bruits No edema Some aphasia  Skin warm and dry No muscular  weakness   Labs    High Sensitivity Troponin:   Recent Labs  Lab 08/28/21 0451 08/28/21 0753 08/28/21 1231 08/28/21 2341 08/29/21 0035  TROPONINIHS 657* 967* 1,001* 864* 802*     Chemistry Recent Labs  Lab 08/27/21 1313 08/27/21 1336 08/29/21 0035 08/30/21 0448 08/31/21 0721 09/01/21 0127  NA 129*   < > 138 141  --  138  K 4.1   < > 3.7 3.0*  --  2.7*  CL 94*   < > 108 107  --  103  CO2 8*   < > 17* 23  --  25  GLUCOSE 599*   < > 217* 141*  --  89  BUN 38*   < > 17 14  --  14  CREATININE 2.38*   < > 1.48* 1.39*  --  1.25*  CALCIUM 9.5   < > 9.5 9.7  --  9.0  MG  --   --   --  1.1* 1.4* 2.1  PROT 8.0  --   --   --   --   --   ALBUMIN 3.0*  --   --   --   --   --   AST 14*  --   --   --   --   --  ALT 14  --   --   --   --   --   ALKPHOS 208*  --   --   --   --   --   BILITOT 1.8*  --   --   --   --   --   GFRNONAA 20*   < > 36* 39*  --  44*  ANIONGAP 27*   < > 13 11  --  10   < > = values in this interval not displayed.    Lipids  Recent Labs  Lab 08/28/21 0815  CHOL 134  TRIG 125  HDL 19*  LDLCALC 90  CHOLHDL 7.1    Hematology Recent Labs  Lab 08/27/21 1313 08/27/21 1336 09/01/21 0127  WBC 15.5*  --  15.9*  RBC 4.49  --  3.94  HGB 12.5 13.9 11.1*  HCT 40.0 41.0 33.3*  MCV 89.1  --  84.5  MCH 27.8  --  28.2  MCHC 31.3  --  33.3  RDW 13.7  --  14.6  PLT 406*  --  338   Thyroid  Recent Labs  Lab 08/28/21 0451  TSH 1.372    BNPNo results for input(s): BNP, PROBNP in the last 168 hours.  DDimer No results for input(s): DDIMER in the last 168 hours.   Radiology    ECHOCARDIOGRAM LIMITED  Result Date: 08/30/2021    ECHOCARDIOGRAM LIMITED REPORT   Patient Name:   Sonya Duffy Date of Exam: 08/30/2021 Medical Rec #:  [id number]       Height:       62.0 in Accession #:    [id number]      Weight:       154.0 lb Date of Birth:  [birth year]       BSA:          60.711 m Patient Age:    75 years        BP:           122/48 mmHg Patient Gender: F                HR:           88 bpm. Exam Location:  Inpatient Procedure: Limited Echo and Intracardiac Opacification Agent Indications:    Rule out thrombus  History:        Patient has prior history of Echocardiogram examinations, most                 recent 08/29/2021. Stroke.  Sonographer:    Merrie Roof RDCS Referring Phys: [place]  1. Limited study with definity to R/O apical thrombus; apical akinesis with overall low normal LV function; no thrombus noted.  2. Left ventricular ejection fraction, by estimation, is 50 to 55%. The left ventricle has low normal function. The left ventricle demonstrates regional wall motion abnormalities (see scoring diagram/findings for description).  3. Right ventricular systolic function is normal. The right ventricular size is normal. FINDINGS  Left Ventricle: Left ventricular ejection fraction, by estimation, is 50 to 55%. The left ventricle has low normal function. The left ventricle demonstrates regional wall motion abnormalities. Definity contrast agent was given IV to delineate the left ventricular endocardial borders. Right Ventricle: The right ventricular size is normal. Right ventricular systolic function is normal. Left Atrium: Left atrial size was normal in size. Right Atrium: Right atrial size was normal in size. Pericardium: There is no evidence of pericardial effusion. Additional Comments:  Limited study with definity to R/O apical thrombus; apical akinesis with overall low normal LV function; no thrombus noted. Kirk Ruths MD Electronically signed by Kirk Ruths MD Signature Date/Time: 08/30/2021/6:12:08 PM    Final     Cardiac Studies   Limited echo 08/30/21: 1. Limited study with definity to R/O apical thrombus; apical akinesis  with overall low normal LV function; no thrombus noted.   2. Left ventricular ejection fraction, by estimation, is 50 to 55%. The  left ventricle has low normal function. The left ventricle demonstrates   regional wall motion abnormalities (see scoring diagram/findings for  description).   3. Right ventricular systolic function is normal. The right ventricular  size is normal.    Echo 08/29/21:  1. Left ventricular ejection fraction, by estimation, is 55 to 60%. The  left ventricle has normal function. The left ventricle demonstrates  regional wall motion abnormalities (see scoring diagram/findings for  description). There is moderate asymmetric  left ventricular hypertrophy of the basal-septal segment. Indeterminate  diastolic filling due to E-A fusion.   2. Right ventricular systolic function is normal. The right ventricular  size is normal. Tricuspid regurgitation signal is inadequate for assessing  PA pressure.   3. The mitral valve is normal in structure. Trivial mitral valve  regurgitation. No evidence of mitral stenosis.   4. The aortic valve is grossly normal. Aortic valve regurgitation is not  visualized. No aortic stenosis is present.   5. The inferior vena cava is normal in size with greater than 50%  respiratory variability, suggesting right atrial pressure of 3 mmHg.   Patient Profile     75 y.o. female with a history of hypertension, hyperlipidemia, insulin-dependent diabetes mellitus who was admitted on 08/27/2021 with DKA, subacute/acute stroke, and AKI. Cardiology consulted for evaluation of elevated troponin.  Assessment & Plan    NSTEMI:  echo with preserved EF apical akinesis no thrombus on contrast study. Troponin peak 08/28/21 at 1001. ECG with precordial T inversions No chest pain continue DAT statin and beta blocker. Per Dr Debara Pickett consider outpatient myovue after stroke recovery    CVA [place] small vessel not thought to be embolic in setting of DKA and poorly controlled risk factors. ASA/Plavix statin Contrast TTE no apical thrombus   Hypertension - BP continues to be labile - elevated prior to AM metoprolol  Hyperlipidemia with LDL goal < 70  LDL 134 on  Lipitor 40 mg    DKA-  A1c 13.3 noncompliant with insulin per primary service   A/CRF:   dehydration due to DKA improved 2.4-> 1.39   Will arrange outpatient f/u with Dr Debara Pickett       For questions or updates, please contact [place] Please consult [email or web address] for contact info under        Signed, Jenkins Rouge, MD  09/01/2021, 9:56 AM   Patient ID: Sonya Duffy, female   DOB: 1946-03-01, 75 y.o.   MRN: [id number]

## 2021-09-01 NOTE — Progress Notes (Deleted)
     Dr Hilty's rounding note reviewed. No additional recs at this time. Continue medical therapy for possible NSTEMI with ASA, plavix (also on for CVA), atorva 40, lopressor 25mg  bid. With renal function no ACE/ARB for now. I agree with Dr Debara Pickett no plans for inpatient ischemic testing, could consider noninsvive testing as outpatient depending on clinical course related to any recurrent cardiac symptosm, and her recovery from CVA and current bacteremia.   We will sign off inpatient care  Carlyle Dolly MD      Signed, Carlyle Dolly, MD  09/01/2021, 9:54 AM

## 2021-09-01 NOTE — Progress Notes (Signed)
Inpatient Diabetes Program Recommendations  AACE/ADA: New Consensus Statement on Inpatient Glycemic Control   Target Ranges:  Prepandial:   less than 140 mg/dL      Peak postprandial:   less than 180 mg/dL (1-2 hours)      Critically ill patients:  140 - 180 mg/dL   Results for Sonya Duffy, Sonya Duffy (MRN [id number]) as of 09/01/2021 07:05  Ref. Range 09/01/2021 01:27  Glucose Latest Ref Range: 70 - 99 mg/dL 89   Results for Sonya Duffy, Sonya Duffy (MRN [id number]) as of 09/01/2021 07:05  Ref. Range 08/31/2021 08:07 08/31/2021 12:01 08/31/2021 16:54 08/31/2021 19:55  Glucose-Capillary Latest Ref Range: 70 - 99 mg/dL 150 (H) 263 (H) 302 (H) 209 (H)   Review of Glycemic Control  Current orders for Inpatient glycemic control: Semglee 25 units daily, Novolog 0-15 units AC&HS  Inpatient Diabetes Program Recommendations:    Insulin: Please consider ordering Novolog 3 units TID with meals for meal coverage if patient eats at least 50% of meals and changing correction to Novolog 0-15 units TID with meals and Novolog 0-5 units QHS (would decrease bedtime correction scale).  Thanks, Barnie Alderman, RN, MSN, CDE Diabetes Coordinator Inpatient Diabetes Program [phone] (Team Pager from 8am to 5pm)

## 2021-09-01 NOTE — TOC Progression Note (Signed)
Transition of Care [facility]) - Progression Note    Patient Details  Name: Sonya Duffy MRN: [id number] Date of Birth: 08-23-1946  Transition of Care [facility]) CM/SW [place], RN Phone Number: 09/01/2021, 5:03 PM  Clinical Narrative:    [place] set up with [facility] PT OT aide  Expected Discharge Plan: [place] Barriers to Discharge: Continued Medical Work up  Expected Discharge Plan and Services Expected Discharge Plan: [place]   Discharge Planning Services: CM Consult   Living arrangements for the past 2 months: Single Family Home                 DME Arranged: 3-N-1, Hospital bed, Walker rolling   Date DME Agency Contacted: 08/31/21 Time DME Agency Contacted: [phone] Representative spoke with at DME Agency: Freda Munro HH Arranged: OT, PT, Nurse's Aide [place] Agency: [place] Date [place]: 09/01/21 Time [place]: 1703 Representative spoke with at [place]: Amy   Social Determinants of Health ([place]) Interventions    Readmission Risk Interventions No flowsheet data found.

## 2021-09-02 DIAGNOSIS — B370 Candidal stomatitis: Secondary | ICD-10-CM | POA: Diagnosis not present

## 2021-09-02 DIAGNOSIS — R7881 Bacteremia: Secondary | ICD-10-CM | POA: Diagnosis not present

## 2021-09-02 DIAGNOSIS — N760 Acute vaginitis: Secondary | ICD-10-CM | POA: Diagnosis not present

## 2021-09-02 DIAGNOSIS — E0810 Diabetes mellitus due to underlying condition with ketoacidosis without coma: Secondary | ICD-10-CM | POA: Diagnosis not present

## 2021-09-02 DIAGNOSIS — I214 Non-ST elevation (NSTEMI) myocardial infarction: Secondary | ICD-10-CM | POA: Diagnosis not present

## 2021-09-02 LAB — CBC
HCT: 33.1 % — ABNORMAL LOW (ref 36.0–46.0)
Hemoglobin: 10.8 g/dL — ABNORMAL LOW (ref 12.0–15.0)
MCH: 28 pg (ref 26.0–34.0)
MCHC: 32.6 g/dL (ref 30.0–36.0)
MCV: 85.8 fL (ref 80.0–100.0)
Platelets: 339 10*3/uL (ref 150–400)
RBC: 3.86 MIL/uL — ABNORMAL LOW (ref 3.87–5.11)
RDW: 14.9 % (ref 11.5–15.5)
WBC: 18.1 10*3/uL — ABNORMAL HIGH (ref 4.0–10.5)
nRBC: 0 % (ref 0.0–0.2)

## 2021-09-02 LAB — GLUCOSE, CAPILLARY
Glucose-Capillary: 106 mg/dL — ABNORMAL HIGH (ref 70–99)
Glucose-Capillary: 186 mg/dL — ABNORMAL HIGH (ref 70–99)
Glucose-Capillary: 184 mg/dL — ABNORMAL HIGH (ref 70–99)
Glucose-Capillary: 239 mg/dL — ABNORMAL HIGH (ref 70–99)

## 2021-09-02 LAB — BASIC METABOLIC PANEL
Anion gap: 10 (ref 5–15)
BUN: 15 mg/dL (ref 8–23)
CO2: 23 mmol/L (ref 22–32)
Calcium: 8.9 mg/dL (ref 8.9–10.3)
Chloride: 107 mmol/L (ref 98–111)
Creatinine, Ser: 1.14 mg/dL — ABNORMAL HIGH (ref 0.44–1.00)
GFR, Estimated: 50 mL/min — ABNORMAL LOW (ref >60)
Glucose, Bld: 118 mg/dL — ABNORMAL HIGH (ref 70–99)
Potassium: 4.8 mmol/L (ref 3.5–5.1)
Sodium: 140 mmol/L (ref 135–145)

## 2021-09-02 LAB — TROPONIN I (HIGH SENSITIVITY): Troponin I (High Sensitivity): 54 ng/L — ABNORMAL HIGH (ref <18)

## 2021-09-02 LAB — MAGNESIUM: Magnesium: 1.8 mg/dL (ref 1.7–2.4)

## 2021-09-02 MED ORDER — CLOTRIMAZOLE 1 % EX CREA
TOPICAL_CREAM | Freq: Two times a day (BID) | CUTANEOUS | Status: DC
  Filled 2021-09-02: qty 15

## 2021-09-02 MED ORDER — FLUCONAZOLE 100 MG PO TABS
100.0000 mg | ORAL_TABLET | Freq: Every day | ORAL | Status: DC
  Administered 2021-09-02 – 2021-09-05 (×4): 100 mg via ORAL
  Filled 2021-09-02 (×4): qty 1

## 2021-09-02 NOTE — Progress Notes (Signed)
[facility]  Date of Admission:  08/27/2021      Lines: Peripheral iv's  Abx: 9/21-c cefazolin 9/24-c fluconazole  ASSESSMENT: 75 yo female dm2 but recent self dc insulin, 2 days sepsis sx prior to admission with so far low burden mssa bacteremia and also mri evidence subacute stroke   9/20 admission bcx 1 of 2 set mssa 9/21 & 22 tte no obvious vegetation/thrombus   9/20 mri brain suggest subacute infarct left pons   Sepsis had quickly defervesced within 24 hours. She does have elevated troponin likely demand ischemia   No prosthesis/device   I do not suspect the stroke is infectious in nature as had no issue with being on cefazolin and no current ams/headache otherwise  There is question of need to do tee. In this lady, it would be a reasonable alternative if repeat bcx negative to treat with 2 weeks abx and close monitoring off abx rather than subjecting her to tee  ----- 9/25 assessment Clinically well No sepsis One day wbc elevation after down trend, unclear signficance Primary team had started tx for vaginal discharge presumed yeast and also oral thrush 9/23 repeat bcx ngtd      -f/u repeat bcx; tomorrow if negative can place picc -plan 2 weeks of iv abx from 9/23 if bcx remains negative -continue cefazolin -fluconazole deferred to primary team; reasonable to do 10 day in setting thrush/vaginitis -new id team to see tomorrow   I spent more than 35 minute reviewing data/chart, and coordinating care and >50% direct face to face time providing counseling/discussing diagnostics/treatment plan with patient  Principal Problem:   DKA (diabetic ketoacidosis) ([place]) Active Problems:   Cerebral thrombosis with cerebral infarction   Non-ST elevation (NSTEMI) myocardial infarction ([place])   MSSA bacteremia   No Known Allergies  Scheduled Meds:  aspirin  325 mg Oral Daily   atorvastatin  40 mg Oral Daily   clopidogrel  75 mg  Oral Daily   clotrimazole   Topical BID   conjugated estrogens  1 Applicatorful Vaginal QHS   cyanocobalamin  1,000 mcg Intramuscular Daily   Followed by   Derrill Memo ON 09/04/2021] vitamin B-12  1,000 mcg Oral Daily   enoxaparin (LOVENOX) injection  40 mg Subcutaneous QHS   fluconazole  100 mg Oral Daily   insulin aspart  0-15 Units Subcutaneous TID AC & HS   insulin glargine-yfgn  25 Units Subcutaneous Daily   metoprolol tartrate  25 mg Oral BID   nystatin  5 mL Oral QID   pantoprazole  40 mg Oral Daily   Continuous Infusions:   ceFAZolin (ANCEF) IV 2 g (09/02/21 0634)   PRN Meds:.acetaminophen, alum & mag hydroxide-simeth, dextrose, lidocaine, nitroGLYCERIN, ondansetron (ZOFRAN) IV   SUBJECTIVE: Felt well Hungry, askign for lunch Started on fluconazole by primary team for vaginal discharge/oral thrush No fever Wbc down but up once No n/v/diarrhea, rash No oral pain with swallow No headache Oob to chair  Review of Systems: ROS All other ROS was negative, except mentioned above     OBJECTIVE: Vitals:   09/02/21 0010 09/02/21 0400 09/02/21 0748 09/02/21 1124  BP: (!) 113/51 (!) 147/61 (!) 167/76 123/61  Pulse: 73 76 88 68  Resp: 15 14 17 16   Temp: 97.7 F (36.5 C) 97.6 F (36.4 C) 98.2 F (36.8 C) 97.8 F (36.6 C)  TempSrc: Oral Oral Oral Oral  SpO2: 98% 96% 100% 99%  Weight:  71 kg  Height:       Body mass index is 28.63 kg/m.  Physical Exam General/constitutional: no distress, pleasant HEENT: Normocephalic, PER, Conj Clear, EOMI, tongue with white coating that is tender when I attempted to scrape at it Neck supple CV: rrr no mrg Lungs: clear to auscultation, normal respiratory effort Abd: Soft, Nontender Ext: no edema Skin: No Rash Neuro: nonfocal MSK: no peripheral joint swelling/tenderness/warmth; back spines nontender    Lab Results Lab Results  Component Value Date   WBC 18.1 (H) 09/02/2021   HGB 10.8 (L) 09/02/2021   HCT 33.1 (L)  09/02/2021   MCV 85.8 09/02/2021   PLT 339 09/02/2021    Lab Results  Component Value Date   CREATININE 1.14 (H) 09/02/2021   BUN 15 09/02/2021   NA 140 09/02/2021   K 4.8 09/02/2021   CL 107 09/02/2021   CO2 23 09/02/2021    Lab Results  Component Value Date   ALT 14 08/27/2021   AST 14 (L) 08/27/2021   ALKPHOS 208 (H) 08/27/2021   BILITOT 1.8 (H) 08/27/2021      Microbiology: Recent Results (from the past 240 hour(s))  Resp Panel by RT-PCR (Flu A&B, Covid) Nasopharyngeal Swab     Status: None   Collection Time: 08/27/21  3:43 PM   Specimen: Nasopharyngeal Swab; Nasopharyngeal(NP) swabs in vial transport medium  Result Value Ref Range Status   SARS Coronavirus 2 by RT PCR NEGATIVE NEGATIVE Final    Comment: (NOTE) SARS-CoV-2 target nucleic acids are NOT DETECTED.  The SARS-CoV-2 RNA is generally detectable in upper respiratory specimens during the acute phase of infection. The lowest concentration of SARS-CoV-2 viral copies this assay can detect is 138 copies/mL. A negative result does not preclude SARS-Cov-2 infection and should not be used as the sole basis for treatment or other patient management decisions. A negative result may occur with  improper specimen collection/handling, submission of specimen other than nasopharyngeal swab, presence of viral mutation(s) within the areas targeted by this assay, and inadequate number of viral copies(<138 copies/mL). A negative result must be combined with clinical observations, patient history, and epidemiological information. The expected result is Negative.  Fact Sheet for Patients:  EntrepreneurPulse.com.au  Fact Sheet for Healthcare Providers:  IncredibleEmployment.be  This test is no t yet approved or cleared by the [place] FDA and  has been authorized for detection and/or diagnosis of SARS-CoV-2 by FDA under an Emergency Use Authorization (EUA). This EUA will remain  in  effect (meaning this test can be used) for the duration of the COVID-19 declaration under Section 564(b)(1) of the Act, 21 U.S.C.section 360bbb-3(b)(1), unless the authorization is terminated  or revoked sooner.       Influenza A by PCR NEGATIVE NEGATIVE Final   Influenza B by PCR NEGATIVE NEGATIVE Final    Comment: (NOTE) The Xpert Xpress SARS-CoV-2/FLU/RSV plus assay is intended as an aid in the diagnosis of influenza from Nasopharyngeal swab specimens and should not be used as a sole basis for treatment. Nasal washings and aspirates are unacceptable for Xpert Xpress SARS-CoV-2/FLU/RSV testing.  Fact Sheet for Patients: EntrepreneurPulse.com.au  Fact Sheet for Healthcare Providers: IncredibleEmployment.be  This test is not yet approved or cleared by the [place] FDA and has been authorized for detection and/or diagnosis of SARS-CoV-2 by FDA under an Emergency Use Authorization (EUA). This EUA will remain in effect (meaning this test can be used) for the duration of the COVID-19 declaration under Section 564(b)(1) of the Act, 21 U.S.C. section  360bbb-3(b)(1), unless the authorization is terminated or revoked.  Performed at [facility], [place] [address] [place], [place] [address]   Culture, blood (Routine X 2) w Reflex to ID Panel     Status: None (Preliminary result)   Collection Time: 08/28/21 11:41 PM   Specimen: BLOOD  Result Value Ref Range Status   Specimen Description BLOOD LEFT ANTECUBITAL  Final   Special Requests AEROBIC BOTTLE ONLY Blood Culture adequate volume  Final   Culture   Final    NO GROWTH 3 DAYS Performed at [facility], [place] [address], [place], [place] [address]    Report Status PENDING  Incomplete  Culture, blood (Routine X 2) w Reflex to ID Panel     Status: Abnormal   Collection Time: 08/28/21 11:42 PM   Specimen: BLOOD LEFT FOREARM  Result Value Ref Range Status   Specimen Description BLOOD  LEFT FOREARM  Final   Special Requests   Final    BOTTLES DRAWN AEROBIC AND ANAEROBIC Blood Culture adequate volume   Culture  Setup Time   Final    GRAM POSITIVE COCCI IN CLUSTERS IN BOTH AEROBIC AND ANAEROBIC BOTTLES CRITICAL RESULT CALLED TO, READ BACK BY AND VERIFIED WITH: Burnett Kanaris [id number] AT 1831 BY CM Performed at [facility], [place] [address], [place], [place] [address]    Culture STAPHYLOCOCCUS AUREUS (A)  Final   Report Status 08/31/2021 FINAL  Final   Organism ID, Bacteria STAPHYLOCOCCUS AUREUS  Final      Susceptibility   Staphylococcus aureus - MIC*    CIPROFLOXACIN <=0.5 SENSITIVE Sensitive     ERYTHROMYCIN >=8 RESISTANT Resistant     GENTAMICIN <=0.5 SENSITIVE Sensitive     OXACILLIN <=0.25 SENSITIVE Sensitive     TETRACYCLINE <=1 SENSITIVE Sensitive     VANCOMYCIN 1 SENSITIVE Sensitive     TRIMETH/SULFA <=10 SENSITIVE Sensitive     CLINDAMYCIN <=0.25 SENSITIVE Sensitive     RIFAMPIN <=0.5 SENSITIVE Sensitive     Inducible Clindamycin NEGATIVE Sensitive     * STAPHYLOCOCCUS AUREUS  Blood Culture ID Panel (Reflexed)     Status: Abnormal   Collection Time: 08/28/21 11:42 PM  Result Value Ref Range Status   Enterococcus faecalis NOT DETECTED NOT DETECTED Final   Enterococcus Faecium NOT DETECTED NOT DETECTED Final   Listeria monocytogenes NOT DETECTED NOT DETECTED Final   Staphylococcus species DETECTED (A) NOT DETECTED Final    Comment: CRITICAL RESULT CALLED TO, READ BACK BY AND VERIFIED WITH: PHARMD G BARR [id number] AT 1830 BY CM    Staphylococcus aureus (BCID) DETECTED (A) NOT DETECTED Final    Comment: CRITICAL RESULT CALLED TO, READ BACK BY AND VERIFIED WITH: PHARMD G BARR [id number] AT 1830 BY CM    Staphylococcus epidermidis NOT DETECTED NOT DETECTED Final   Staphylococcus lugdunensis NOT DETECTED NOT DETECTED Final   Streptococcus species NOT DETECTED NOT DETECTED Final   Streptococcus agalactiae NOT DETECTED NOT DETECTED Final   Streptococcus  pneumoniae NOT DETECTED NOT DETECTED Final   Streptococcus pyogenes NOT DETECTED NOT DETECTED Final   A.calcoaceticus-baumannii NOT DETECTED NOT DETECTED Final   Bacteroides fragilis NOT DETECTED NOT DETECTED Final   Enterobacterales NOT DETECTED NOT DETECTED Final   Enterobacter cloacae complex NOT DETECTED NOT DETECTED Final   Escherichia coli NOT DETECTED NOT DETECTED Final   Klebsiella aerogenes NOT DETECTED NOT DETECTED Final   Klebsiella oxytoca NOT DETECTED NOT DETECTED Final   Klebsiella pneumoniae NOT DETECTED NOT DETECTED  Final   Proteus species NOT DETECTED NOT DETECTED Final   Salmonella species NOT DETECTED NOT DETECTED Final   Serratia marcescens NOT DETECTED NOT DETECTED Final   Haemophilus influenzae NOT DETECTED NOT DETECTED Final   Neisseria meningitidis NOT DETECTED NOT DETECTED Final   Pseudomonas aeruginosa NOT DETECTED NOT DETECTED Final   Stenotrophomonas maltophilia NOT DETECTED NOT DETECTED Final   Candida albicans NOT DETECTED NOT DETECTED Final   Candida auris NOT DETECTED NOT DETECTED Final   Candida glabrata NOT DETECTED NOT DETECTED Final   Candida krusei NOT DETECTED NOT DETECTED Final   Candida parapsilosis NOT DETECTED NOT DETECTED Final   Candida tropicalis NOT DETECTED NOT DETECTED Final   Cryptococcus neoformans/gattii NOT DETECTED NOT DETECTED Final   Meth resistant mecA/C and MREJ NOT DETECTED NOT DETECTED Final    Comment: Performed at [facility], [address] [place], [place] [address]  Culture, blood (routine x 2)     Status: None (Preliminary result)   Collection Time: 08/31/21  7:21 AM   Specimen: BLOOD  Result Value Ref Range Status   Specimen Description BLOOD LEFT ANTECUBITAL  Final   Special Requests   Final    BOTTLES DRAWN AEROBIC ONLY Blood Culture results may not be optimal due to an inadequate volume of blood received in culture bottles   Culture   Final    NO GROWTH < 24 HOURS Performed at [facility], [place] [address], [place], [place] [address]    Report Status PENDING  Incomplete  Culture, blood (routine x 2)     Status: None (Preliminary result)   Collection Time: 08/31/21  7:25 AM   Specimen: BLOOD LEFT HAND  Result Value Ref Range Status   Specimen Description BLOOD LEFT HAND  Final   Special Requests   Final    BOTTLES DRAWN AEROBIC ONLY Blood Culture results may not be optimal due to an inadequate volume of blood received in culture bottles   Culture   Final    NO GROWTH < 24 HOURS Performed at [facility], [place] [address] [place], [place] [address]    Report Status PENDING  Incomplete     Serology:   Imaging: If present, new imagings (plain films, ct scans, and mri) have been personally visualized and interpreted; radiology reports have been reviewed. Decision making incorporated into the Impression / Recommendations.  9/20 mri brain 1. Subacute infarct of the left paracentral pons. Associated microhemorrhage but no malignant hemorrhagic transformation or mass effect.   2. Underlying chronic right SCA territory infarct.   3. Small 15 mm meningioma along the right convexity with no mass effect or associated edema.   4. 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 But no acute ventriculomegaly or transependymal edema suspected.  9/22 tte  1. Limited study with definity to R/O apical thrombus; apical akinesis  with overall low normal LV function; no thrombus noted.   2. Left ventricular ejection fraction, by estimation, is 50 to 55%. The  left ventricle has low normal function. The left ventricle demonstrates  regional wall motion abnormalities (see scoring diagram/findings for  description).   3. Right ventricular systolic function is normal. The right ventricular  size is normal.   Jabier Mutton, [facility] [place] [phone] pager    09/02/2021, 12:06 PM

## 2021-09-02 NOTE — Progress Notes (Addendum)
Progress Note    Sonya Duffy  JOA:[id number] DOB: [birth year]  DOA: 08/27/2021 PCP: Pcp, No    Brief Narrative:     Medical records reviewed and are as summarized below:  Sonya Duffy is an 75 y.o. female patient was found to be in DKA.  CT head was done which showed chronic appearing right cerebellar infarct, area of extra axial increase attenuation within the right parietal region which may represent partially calcified meningioma.  Neurology was consulted.  MRI brain showed subacute infarct of left paracentral pons, associated microhemorrhage but no malignant hemorrhagic transformation or mass-effect.  Blood cultures positive for MSSA.  Stay complicated with thrush  Assessment/Plan:   Principal Problem:   DKA (diabetic ketoacidosis) ([place]) Active Problems:   Cerebral thrombosis with cerebral infarction   Non-ST elevation (NSTEMI) myocardial infarction ([place])   MSSA bacteremia   Acute vaginitis   Oral thrush   MSSA bacteremia blood culture + for MSSA -back pain resolved -U/A negative X ray negative -abx started -repeated BC NGTD  Thrush/vaginitis -diflucan for 10 days  Hypokalemia/hypomagnesemia -replete  Diabetic ketoacidosis -Resolved -lantus/SSI   Acute Stroke -MRI brain showed left pontine infarct -Neurology consulted -2D echo: Left ventricular ejection fraction, by estimation, is 50 to 55%. The  left ventricle has low normal function. The left ventricle demonstrates  regional wall motion abnormalities  -Hemoglobin A1c 13.3 -Neurology recommends aspirin and Plavix for 3 weeks and then continue aspirin alone  NSTEMI -Troponin elevated at 653; 657; 967; 1001 -Cardiology consulted:  Repeat limited echo shows apical akinesis without thrombus (Contrast used). Suspect she had a distal LAD infarct. Probably no need to consider cath in the future - could consider myoview to look for any reversible ischemia as an outpatient. Agree with DAPT per neuro -  technically, since this was NSTEMI - there is evidence for mortality benefit with 1 year of DAPT with Plavix for medical therapy of NSTEMI. Would consider longer course.     Metabolic encephalopathy -Multifactorial, likely from diabetic ketoacidosis, stroke, NSTEMI -seems to be at baseline   Partially calcified meningioma -Seen on CT head and MRI brain -No mass-effect seen, no intervention recommended   Acute kidney injury -Likely from poor p.o. intake -Creatinine was 2.38 on admission -Improved   B12 deficiency -B12 was found to be 147 -Started on 1000 mcg IM daily for 7 days followed by 1000 mcg p.o. daily        Family Communication/Anticipated D/C date and plan/Code Status   DVT prophylaxis: Lovenox ordered. Code Status: Full Code.  Family Communication: spoke with daughter 9/24, called 9/25 with no answer Disposition Plan: Status is: Inpatient  Remains inpatient appropriate because:Inpatient level of care appropriate due to severity of illness  Dispo: The patient is from: Home              Anticipated d/c is to: home with daughter              Patient currently is not medically stable to d/c.   Difficult to place patient No         Medical Consultants:   Neuro Cards ID (automatic consult)  Subjective:  C/o vaginal itching but her chest feels better  Objective:    Vitals:   09/02/21 0010 09/02/21 0400 09/02/21 0748 09/02/21 1124  BP: (!) 113/51 (!) 147/61 (!) 167/76 123/61  Pulse: 73 76 88 68  Resp: 15 14 17 16   Temp: 97.7 F (36.5 C) 97.6 F (36.4 C) 98.2  F (36.8 C) 97.8 F (36.6 C)  TempSrc: Oral Oral Oral Oral  SpO2: 98% 96% 100% 99%  Weight:  71 kg    Height:        Intake/Output Summary (Last 24 hours) at 09/02/2021 1321 Last data filed at 09/02/2021 0442 Gross per 24 hour  Intake 100 ml  Output 127 ml  Net -27 ml   Filed Weights   08/27/21 1557 09/02/21 0400  Weight: 69.9 kg 71 kg    Exam:    General: Appearance:      Overweight female in no acute distress     Lungs:     respirations unlabored  Heart:    Normal heart rate.    MS:   All extremities are intact.    Neurologic:   Awake, alert, able to make needs known    Data Reviewed:   I have personally reviewed following labs and imaging studies:  Labs: Labs show the following:   Basic Metabolic Panel: Recent Labs  Lab 08/29/21 0035 08/30/21 0448 08/31/21 0721 09/01/21 0127 09/01/21 1721 09/02/21 0030  NA 138 141  --  138 138 140  K 3.7 3.0*  --  2.7* 4.9 4.8  CL 108 107  --  103 106 107  CO2 17* 23  --  25 19* 23  GLUCOSE 217* 141*  --  89 133* 118*  BUN 17 14  --  14 18 15   CREATININE 1.48* 1.39*  --  1.25* 1.14* 1.14*  CALCIUM 9.5 9.7  --  9.0 8.9 8.9  MG  --  1.1* 1.4* 2.1  --  1.8   GFR Estimated Creatinine Clearance: 38.2 mL/min (A) (by C-G formula based on SCr of 1.14 mg/dL (H)). Liver Function Tests: Recent Labs  Lab 08/27/21 1313  AST 14*  ALT 14  ALKPHOS 208*  BILITOT 1.8*  PROT 8.0  ALBUMIN 3.0*   No results for input(s): LIPASE, AMYLASE in the last 168 hours. Recent Labs  Lab 08/28/21 0451  AMMONIA 20   Coagulation profile Recent Labs  Lab 08/27/21 1313  INR 1.2    CBC: Recent Labs  Lab 08/27/21 1313 08/27/21 1336 09/01/21 0127 09/02/21 0030  WBC 15.5*  --  15.9* 18.1*  NEUTROABS 11.9*  --   --   --   HGB 12.5 13.9 11.1* 10.8*  HCT 40.0 41.0 33.3* 33.1*  MCV 89.1  --  84.5 85.8  PLT 406*  --  338 339   Cardiac Enzymes: Recent Labs  Lab 08/29/21 0035  CKTOTAL 51   BNP (last 3 results) No results for input(s): PROBNP in the last 8760 hours. CBG: Recent Labs  Lab 09/01/21 1139 09/01/21 1612 09/01/21 2054 09/02/21 0751 09/02/21 1121  GLUCAP 123* 150* 127* 106* 186*   D-Dimer: No results for input(s): DDIMER in the last 72 hours. Hgb A1c: No results for input(s): HGBA1C in the last 72 hours.  Lipid Profile: No results for input(s): CHOL, HDL, LDLCALC, TRIG, CHOLHDL,  LDLDIRECT in the last 72 hours.  Thyroid function studies: No results for input(s): TSH, T4TOTAL, T3FREE, THYROIDAB in the last 72 hours.  Invalid input(s): FREET3  Anemia work up: No results for input(s): VITAMINB12, FOLATE, FERRITIN, TIBC, IRON, RETICCTPCT in the last 72 hours.  Sepsis Labs: Recent Labs  Lab 08/27/21 1313 09/01/21 0127 09/02/21 0030  WBC 15.5* 15.9* 18.1*    Microbiology Recent Results (from the past 240 hour(s))  Resp Panel by RT-PCR (Flu A&B, Covid) Nasopharyngeal Swab  Status: None   Collection Time: 08/27/21  3:43 PM   Specimen: Nasopharyngeal Swab; Nasopharyngeal(NP) swabs in vial transport medium  Result Value Ref Range Status   SARS Coronavirus 2 by RT PCR NEGATIVE NEGATIVE Final    Comment: (NOTE) SARS-CoV-2 target nucleic acids are NOT DETECTED.  The SARS-CoV-2 RNA is generally detectable in upper respiratory specimens during the acute phase of infection. The lowest concentration of SARS-CoV-2 viral copies this assay can detect is 138 copies/mL. A negative result does not preclude SARS-Cov-2 infection and should not be used as the sole basis for treatment or other patient management decisions. A negative result may occur with  improper specimen collection/handling, submission of specimen other than nasopharyngeal swab, presence of viral mutation(s) within the areas targeted by this assay, and inadequate number of viral copies(<138 copies/mL). A negative result must be combined with clinical observations, patient history, and epidemiological information. The expected result is Negative.  Fact Sheet for Patients:  EntrepreneurPulse.com.au  Fact Sheet for Healthcare Providers:  IncredibleEmployment.be  This test is no t yet approved or cleared by the [place] FDA and  has been authorized for detection and/or diagnosis of SARS-CoV-2 by FDA under an Emergency Use Authorization (EUA). This EUA will  remain  in effect (meaning this test can be used) for the duration of the COVID-19 declaration under Section 564(b)(1) of the Act, 21 U.S.C.section 360bbb-3(b)(1), unless the authorization is terminated  or revoked sooner.       Influenza A by PCR NEGATIVE NEGATIVE Final   Influenza B by PCR NEGATIVE NEGATIVE Final    Comment: (NOTE) The Xpert Xpress SARS-CoV-2/FLU/RSV plus assay is intended as an aid in the diagnosis of influenza from Nasopharyngeal swab specimens and should not be used as a sole basis for treatment. Nasal washings and aspirates are unacceptable for Xpert Xpress SARS-CoV-2/FLU/RSV testing.  Fact Sheet for Patients: EntrepreneurPulse.com.au  Fact Sheet for Healthcare Providers: IncredibleEmployment.be  This test is not yet approved or cleared by the [place] FDA and has been authorized for detection and/or diagnosis of SARS-CoV-2 by FDA under an Emergency Use Authorization (EUA). This EUA will remain in effect (meaning this test can be used) for the duration of the COVID-19 declaration under Section 564(b)(1) of the Act, 21 U.S.C. section 360bbb-3(b)(1), unless the authorization is terminated or revoked.  Performed at [facility], [place] [address], [place], [place] [address]   Culture, blood (Routine X 2) w Reflex to ID Panel     Status: None (Preliminary result)   Collection Time: 08/28/21 11:41 PM   Specimen: BLOOD  Result Value Ref Range Status   Specimen Description BLOOD LEFT ANTECUBITAL  Final   Special Requests AEROBIC BOTTLE ONLY Blood Culture adequate volume  Final   Culture   Final    NO GROWTH 3 DAYS Performed at [facility], [place] [address], [place],  [address]    Report Status PENDING  Incomplete  Culture, blood (Routine X 2) w Reflex to ID Panel     Status: Abnormal   Collection Time: 08/28/21 11:42 PM   Specimen: BLOOD LEFT FOREARM  Result Value Ref Range Status   Specimen  Description BLOOD LEFT FOREARM  Final   Special Requests   Final    BOTTLES DRAWN AEROBIC AND ANAEROBIC Blood Culture adequate volume   Culture  Setup Time   Final    GRAM POSITIVE COCCI IN CLUSTERS IN BOTH AEROBIC AND ANAEROBIC BOTTLES CRITICAL RESULT CALLED TO, READ BACK BY AND  VERIFIED WITH: Burnett Kanaris [id number] AT 1831 BY CM Performed at [facility], [place] [address], [place], [place] [address]    Culture STAPHYLOCOCCUS AUREUS (A)  Final   Report Status 08/31/2021 FINAL  Final   Organism ID, Bacteria STAPHYLOCOCCUS AUREUS  Final      Susceptibility   Staphylococcus aureus - MIC*    CIPROFLOXACIN <=0.5 SENSITIVE Sensitive     ERYTHROMYCIN >=8 RESISTANT Resistant     GENTAMICIN <=0.5 SENSITIVE Sensitive     OXACILLIN <=0.25 SENSITIVE Sensitive     TETRACYCLINE <=1 SENSITIVE Sensitive     VANCOMYCIN 1 SENSITIVE Sensitive     TRIMETH/SULFA <=10 SENSITIVE Sensitive     CLINDAMYCIN <=0.25 SENSITIVE Sensitive     RIFAMPIN <=0.5 SENSITIVE Sensitive     Inducible Clindamycin NEGATIVE Sensitive     * STAPHYLOCOCCUS AUREUS  Blood Culture ID Panel (Reflexed)     Status: Abnormal   Collection Time: 08/28/21 11:42 PM  Result Value Ref Range Status   Enterococcus faecalis NOT DETECTED NOT DETECTED Final   Enterococcus Faecium NOT DETECTED NOT DETECTED Final   Listeria monocytogenes NOT DETECTED NOT DETECTED Final   Staphylococcus species DETECTED (A) NOT DETECTED Final    Comment: CRITICAL RESULT CALLED TO, READ BACK BY AND VERIFIED WITH: PHARMD G BARR [id number] AT 1830 BY CM    Staphylococcus aureus (BCID) DETECTED (A) NOT DETECTED Final    Comment: CRITICAL RESULT CALLED TO, READ BACK BY AND VERIFIED WITH: PHARMD G BARR [id number] AT 1830 BY CM    Staphylococcus epidermidis NOT DETECTED NOT DETECTED Final   Staphylococcus lugdunensis NOT DETECTED NOT DETECTED Final   Streptococcus species NOT DETECTED NOT DETECTED Final   Streptococcus agalactiae NOT DETECTED NOT DETECTED Final    Streptococcus pneumoniae NOT DETECTED NOT DETECTED Final   Streptococcus pyogenes NOT DETECTED NOT DETECTED Final   A.calcoaceticus-baumannii NOT DETECTED NOT DETECTED Final   Bacteroides fragilis NOT DETECTED NOT DETECTED Final   Enterobacterales NOT DETECTED NOT DETECTED Final   Enterobacter cloacae complex NOT DETECTED NOT DETECTED Final   Escherichia coli NOT DETECTED NOT DETECTED Final   Klebsiella aerogenes NOT DETECTED NOT DETECTED Final   Klebsiella oxytoca NOT DETECTED NOT DETECTED Final   Klebsiella pneumoniae NOT DETECTED NOT DETECTED Final   Proteus species NOT DETECTED NOT DETECTED Final   Salmonella species NOT DETECTED NOT DETECTED Final   Serratia marcescens NOT DETECTED NOT DETECTED Final   Haemophilus influenzae NOT DETECTED NOT DETECTED Final   Neisseria meningitidis NOT DETECTED NOT DETECTED Final   Pseudomonas aeruginosa NOT DETECTED NOT DETECTED Final   Stenotrophomonas maltophilia NOT DETECTED NOT DETECTED Final   Candida albicans NOT DETECTED NOT DETECTED Final   Candida auris NOT DETECTED NOT DETECTED Final   Candida glabrata NOT DETECTED NOT DETECTED Final   Candida krusei NOT DETECTED NOT DETECTED Final   Candida parapsilosis NOT DETECTED NOT DETECTED Final   Candida tropicalis NOT DETECTED NOT DETECTED Final   Cryptococcus neoformans/gattii NOT DETECTED NOT DETECTED Final   Meth resistant mecA/C and MREJ NOT DETECTED NOT DETECTED Final    Comment: Performed at [facility], [address], [place], [place] [address]  Culture, blood (routine x 2)     Status: None (Preliminary result)   Collection Time: 08/31/21  7:21 AM   Specimen: BLOOD  Result Value Ref Range Status   Specimen Description BLOOD LEFT ANTECUBITAL  Final   Special Requests   Final    BOTTLES DRAWN AEROBIC ONLY  Blood Culture results may not be optimal due to an inadequate volume of blood received in culture bottles   Culture   Final    NO GROWTH < 24 HOURS Performed at [facility] [address], [place], [place] [address]    Report Status PENDING  Incomplete  Culture, blood (routine x 2)     Status: None (Preliminary result)   Collection Time: 08/31/21  7:25 AM   Specimen: BLOOD LEFT HAND  Result Value Ref Range Status   Specimen Description BLOOD LEFT HAND  Final   Special Requests   Final    BOTTLES DRAWN AEROBIC ONLY Blood Culture results may not be optimal due to an inadequate volume of blood received in culture bottles   Culture   Final    NO GROWTH < 24 HOURS Performed at [facility], [place] [address] [place], [place] [address]    Report Status PENDING  Incomplete    Procedures and diagnostic studies:  No results found.  Medications:    aspirin  325 mg Oral Daily   atorvastatin  40 mg Oral Daily   clopidogrel  75 mg Oral Daily   clotrimazole   Topical BID   conjugated estrogens  1 Applicatorful Vaginal QHS   cyanocobalamin  1,000 mcg Intramuscular Daily   Followed by   Derrill Memo ON 09/04/2021] vitamin B-12  1,000 mcg Oral Daily   enoxaparin (LOVENOX) injection  40 mg Subcutaneous QHS   fluconazole  100 mg Oral Daily   insulin aspart  0-15 Units Subcutaneous TID AC & HS   insulin glargine-yfgn  25 Units Subcutaneous Daily   metoprolol tartrate  25 mg Oral BID   nystatin  5 mL Oral QID   pantoprazole  40 mg Oral Daily   Continuous Infusions:   ceFAZolin (ANCEF) IV 2 g (09/02/21 0634)     LOS: 6 days   Geradine Girt  [facility]   How to contact the [facility] Attending or Consulting provider [place] or covering provider during after hours [place], for this patient?  Check the care team in [facility] and look for a) attending/consulting TR[facility] provider listed and b) the [facility] team listed Log into [email or web address] and use [place]'s universal password to access. If you do not have the password, please contact the hospital operator. Locate the [facility] provider you are looking for under [facility] and page to a number that you can be  directly reached. If you still have difficulty reaching the provider, please page the [facility] (Director on Call) for the Hospitalists listed on amion for assistance.  09/02/2021, 1:21 PM

## 2021-09-02 NOTE — Progress Notes (Signed)
PHARMACIST - PHYSICIAN COMMUNICATION DR:   Eliseo Squires CONCERNING: Antibiotic IV to Oral Route Change Policy  RECOMMENDATION: This patient is receiving fluconazole by the intravenous route.  Based on criteria approved by the Pharmacy and Therapeutics Committee, the antibiotic(s) is/are being converted to the equivalent oral dose form(s).   DESCRIPTION: These criteria include: Patient being treated for a respiratory tract infection, urinary tract infection, cellulitis or clostridium difficile associated diarrhea if on metronidazole The patient is not neutropenic and does not exhibit a GI malabsorption state The patient is eating (either orally or via tube) and/or has been taking other orally administered medications for a least 24 hours The patient is improving clinically and has a Tmax < 100.5  If you have questions about this conversion, please contact the Pharmacy Department  [x]   [phone] )  Zacarias Pontes  Donald Pore, PharmD Pharmacy Resident 09/02/2021, 10:50 AM

## 2021-09-02 NOTE — TOC Progression Note (Signed)
Transition of Care [facility]) - Progression Note    Patient Details  Name: Sonya Duffy MRN: [id number] Date of Birth: Apr 10, 1946  [facility] of [facility]) CM/SW Contact  Carles Collet, RN Phone Number: 09/02/2021, 3:10 PM  Clinical Narrative:       Budd Palmer of [facility] to follow for home infusion needs

## 2021-09-02 NOTE — Progress Notes (Signed)
Physical Therapy Treatment Patient Details Name: Sonya Duffy MRN: [id number] DOB: 06/03/1946 Today's Date: 09/02/2021   History of Present Illness Sonya Duffy is an 75 y.o. female admitted on 08/27/21 and patient was found to be in DKA. Also with metabolic encephalopathy, NSTEMI and AKI. 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 Pt with hx of DM and HTN.    PT Comments    Patient asleep upon arrival and reports feeling tired that she did not sleep much last night. Requires min A for bed mobility, transfers and short distance ambulation with use of RW for support. Balance seems slightly improved from prior session with less assist needed for ambulation. Requires encouragement to participate in mobility. Continues to have cognitive deficits relating to orientation, awareness, problem solving and safety.  Will continue to follow and progress as pt willing/able.    Recommendations for follow up therapy are one component of a multi-disciplinary discharge planning process, led by the attending physician.  Recommendations may be updated based on patient status, additional functional criteria and insurance authorization.  Follow Up Recommendations  Home health PT;Supervision/Assistance - 24 hour     Equipment Recommendations  Rolling walker with 5" wheels;3in1 (PT)    Recommendations for Other Services       Precautions / Restrictions Precautions Precautions: Fall Restrictions Weight Bearing Restrictions: No     Mobility  Bed Mobility Overal bed mobility: Needs Assistance Bed Mobility: Supine to Sit     Supine to sit: Min assist;HOB elevated Sit to supine: Min assist;HOB elevated   General bed mobility comments: +rail, increased time, cues for sequencing and assist with trunk with pt reaching out for therapist despite cues.    Transfers Overall transfer level: Needs assistance Equipment used: Rolling walker (2 wheeled) Transfers: Sit to/from Stand Sit  to Stand: Min assist Stand pivot transfers: Min assist       General transfer comment: cues for hand placement and sequencing, assist to power up x2 from EOB, SPT bed to/from bSC with RW.  Ambulation/Gait Ambulation/Gait assistance: Min guard Gait Distance (Feet): 20 Feet Assistive device: Rolling walker (2 wheeled) Gait Pattern/deviations: Step-through pattern;Decreased stride length;Trunk flexed Gait velocity: decreased   General Gait Details: Slow, mildly unsteady gait with cues for RW management. Pt self limiting distance due to fatigue, and wanting to rest today.   Stairs             Wheelchair Mobility    Modified Rankin (Stroke Patients Only) Modified Rankin (Stroke Patients Only) Pre-Morbid Rankin Score: No significant disability Modified Rankin: Moderately severe disability     Balance Overall balance assessment: Needs assistance Sitting-balance support: No upper extremity supported;Feet supported   Sitting balance - Comments: intermittent assistance to maintain sitting - left lean. Postural control: Left lateral lean Standing balance support: Bilateral upper extremity supported;During functional activity Standing balance-Leahy Scale: Poor Standing balance comment: reliant on external assistance or UE support, Able to perform pericare in standing with Min A for balance.                            Cognition Arousal/Alertness: Awake/alert Behavior During Therapy: WFL for tasks assessed/performed Overall Cognitive Status: Impaired/Different from baseline Area of Impairment: Orientation;Attention;Memory;Following commands;Safety/judgement;Awareness;Problem solving                 Orientation Level: Disoriented to;Time;Place;Situation Current Attention Level: Sustained Memory: Decreased short-term memory Following Commands: Follows one step commands with increased time;Follows multi-step commands  inconsistently Safety/Judgement: Decreased  awareness of deficits Awareness: Emergent Problem Solving: Slow processing;Decreased initiation;Difficulty sequencing;Requires verbal cues;Requires tactile cues General Comments: "Oh, I don't know!"      Exercises      General Comments        Pertinent Vitals/Pain Pain Assessment: No/denies pain    Home Living                      Prior Function            PT Goals (current goals can now be found in the care plan section) Progress towards PT goals: Progressing toward goals (slowly)    Frequency    Min 4X/week      PT Plan Current plan remains appropriate    Co-evaluation              AM-PAC PT "6 Clicks" Mobility   Outcome Measure  Help needed turning from your back to your side while in a flat bed without using bedrails?: A Little Help needed moving from lying on your back to sitting on the side of a flat bed without using bedrails?: A Little Help needed moving to and from a bed to a chair (including a wheelchair)?: A Little Help needed standing up from a chair using your arms (e.g., wheelchair or bedside chair)?: A Little Help needed to walk in hospital room?: A Little Help needed climbing 3-5 steps with a railing? : A Lot 6 Click Score: 17    End of Session Equipment Utilized During Treatment: Gait belt Activity Tolerance: Patient limited by fatigue Patient left: in bed;with call bell/phone within reach;with bed alarm set Nurse Communication: Mobility status PT Visit Diagnosis: Unsteadiness on feet (R26.81);Muscle weakness (generalized) (M62.81)     Time: 8938-1017 PT Time Calculation (min) (ACUTE ONLY): 17 min  Charges:  $Therapeutic Activity: 8-22 mins                     Marisa Severin, PT, DPT Acute Rehabilitation Services Pager [phone] Office [place] 09/02/2021, 1:57 PM

## 2021-09-02 NOTE — Progress Notes (Signed)
Pt reports intermittent bladder pain/ pressure resulting in increased urinary frequency/ urge without urinary output. Vaginal cream insert given, however, pt reports no relief. Daughter (primary caretaker) is requesting gynecology be consulted for further investigation.

## 2021-09-02 NOTE — TOC Progression Note (Addendum)
Transition of Care [facility]) - Progression Note    Patient Details  Name: GLORIMAR STROOPE MRN: [id number] Date of Birth: 12-20-1945  Transition of Care [facility]) CM/SW [place], RN Phone Number: 09/02/2021, 2:13 PM  Clinical Narrative:    Patient will need home IV antibiotics, briefly had discussed this potential with daughter on Friday. Daughter states she would be fine doing this after education.  Messaged provider about adding RN to home health face to face orders, in addition to recommended PT/OT and aide.  Spoke to Amy at [facility] who accepted lfor PT OT and aide verbally on 9/25. She will look at information and reply back about adding RN  1425 tentative acceptance to add RN from [facility], they are meeting for staffing first thing in the morning.   Expected Discharge Plan: [place] Barriers to Discharge: Continued Medical Work up  Expected Discharge Plan and Services Expected Discharge Plan: [place]   Discharge Planning Services: CM Consult   Living arrangements for the past 2 months: Single Family Home                 DME Arranged: 3-N-1, Hospital bed, Walker rolling   Date DME Agency Contacted: 08/31/21 Time DME Agency Contacted: [phone] Representative spoke with at DME Agency: Freda Munro HH Arranged: OT, PT, Nurse's Aide [place] Agency: [place] Date [place]: 09/01/21 Time [place]: 1703 Representative spoke with at [place]: Amy   Social Determinants of Health ([place]) Interventions    Readmission Risk Interventions No flowsheet data found.

## 2021-09-02 NOTE — Progress Notes (Signed)
HOSPITAL MEDICINE OVERNIGHT EVENT NOTE    Yesterday evening patient experienced vague intermittent indigestion.  Due to patient's recent history of an NSTEMI, serial troponins were obtained and were found to be 53 and 54 respectively.  The flat trajectory of these serial troponins makes recurrent ACS/plaque rupture extremely unlikely.  EKG was also obtained yesterday evening which revealed no evidence of dynamic ST segment change.  Follow-up discussions with nursing in the late evening and early morning reveal patient is resting comfortably, continuing to monitor patient closely on telemetry.  Sonya Emerald  MD [facility]

## 2021-09-03 ENCOUNTER — Other Ambulatory Visit (HOSPITAL_COMMUNITY): Payer: Self-pay

## 2021-09-03 DIAGNOSIS — E0810 Diabetes mellitus due to underlying condition with ketoacidosis without coma: Secondary | ICD-10-CM | POA: Diagnosis not present

## 2021-09-03 DIAGNOSIS — R7881 Bacteremia: Secondary | ICD-10-CM | POA: Diagnosis not present

## 2021-09-03 DIAGNOSIS — B9561 Methicillin susceptible Staphylococcus aureus infection as the cause of diseases classified elsewhere: Secondary | ICD-10-CM | POA: Diagnosis not present

## 2021-09-03 DIAGNOSIS — N760 Acute vaginitis: Secondary | ICD-10-CM | POA: Diagnosis not present

## 2021-09-03 LAB — COMPREHENSIVE METABOLIC PANEL
ALT: <5 U/L (ref 0–44)
AST: 20 U/L (ref 15–41)
Albumin: 1.7 g/dL — ABNORMAL LOW (ref 3.5–5.0)
Alkaline Phosphatase: 476 U/L — ABNORMAL HIGH (ref 38–126)
Anion gap: 10 (ref 5–15)
BUN: 13 mg/dL (ref 8–23)
CO2: 24 mmol/L (ref 22–32)
Calcium: 8.4 mg/dL — ABNORMAL LOW (ref 8.9–10.3)
Chloride: 101 mmol/L (ref 98–111)
Creatinine, Ser: 1.26 mg/dL — ABNORMAL HIGH (ref 0.44–1.00)
GFR, Estimated: 44 mL/min — ABNORMAL LOW (ref >60)
Glucose, Bld: 232 mg/dL — ABNORMAL HIGH (ref 70–99)
Potassium: 4.2 mmol/L (ref 3.5–5.1)
Sodium: 135 mmol/L (ref 135–145)
Total Bilirubin: 0.4 mg/dL (ref 0.3–1.2)
Total Protein: 5.9 g/dL — ABNORMAL LOW (ref 6.5–8.1)

## 2021-09-03 LAB — GLUCOSE, CAPILLARY
Glucose-Capillary: 89 mg/dL (ref 70–99)
Glucose-Capillary: 209 mg/dL — ABNORMAL HIGH (ref 70–99)
Glucose-Capillary: 118 mg/dL — ABNORMAL HIGH (ref 70–99)
Glucose-Capillary: 177 mg/dL — ABNORMAL HIGH (ref 70–99)
Glucose-Capillary: 181 mg/dL — ABNORMAL HIGH (ref 70–99)

## 2021-09-03 LAB — CBC
HCT: 29.1 % — ABNORMAL LOW (ref 36.0–46.0)
Hemoglobin: 9.3 g/dL — ABNORMAL LOW (ref 12.0–15.0)
MCH: 27.8 pg (ref 26.0–34.0)
MCHC: 32 g/dL (ref 30.0–36.0)
MCV: 86.9 fL (ref 80.0–100.0)
Platelets: 292 10*3/uL (ref 150–400)
RBC: 3.35 MIL/uL — ABNORMAL LOW (ref 3.87–5.11)
RDW: 14.8 % (ref 11.5–15.5)
WBC: 15.9 10*3/uL — ABNORMAL HIGH (ref 4.0–10.5)
nRBC: 0.1 % (ref 0.0–0.2)

## 2021-09-03 LAB — CULTURE, BLOOD (ROUTINE X 2)
Culture: NO GROWTH
Special Requests: ADEQUATE

## 2021-09-03 MED ORDER — INSULIN GLARGINE-YFGN 100 UNIT/ML ~~LOC~~ SOLN
28.0000 [IU] | Freq: Every day | SUBCUTANEOUS | Status: DC
  Administered 2021-09-04: 28 [IU] via SUBCUTANEOUS
  Filled 2021-09-03: qty 0.28

## 2021-09-03 NOTE — Progress Notes (Signed)
Occupational Therapy Treatment Patient Details Name: Sonya Duffy MRN: [id number] DOB: 08-05-1946 Today's Date: 09/03/2021   History of present illness Sonya Duffy is an 75 y.o. female admitted on 08/27/21 and patient was found to be in DKA. Also with metabolic encephalopathy, NSTEMI and AKI. 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 Pt with hx of DM and HTN.   OT comments  [name] is progressing, however session limited by pt perseverating on finding her daughters number and calling her. Pt required mod-max re-direction to task throughout the whole session despite offering to write daughter's number down and call her at the end of the session. Pt required incased verbal cues for sequencing all tasks including bed mobility, mobility, and grooming at the sink. Pt has poor safety awareness and management of the RW, she is a high fall risk. Pt was given a 3 step trail making task, and only able to initiate the 1st step, and then a 2 step task and not able to initiate any of the task. Pt continues to benefit from continued OT acutely. D/c plan remains appropriate with 24/7 supervision.    Recommendations for follow up therapy are one component of a multi-disciplinary discharge planning process, led by the attending physician.  Recommendations may be updated based on patient status, additional functional criteria and insurance authorization.    Follow Up Recommendations  Home health OT    Equipment Recommendations  None recommended by OT       Precautions / Restrictions Precautions Precautions: Fall Restrictions Weight Bearing Restrictions: No       Mobility Bed Mobility Overal bed mobility: Needs Assistance Bed Mobility: Rolling;Sidelying to Sit;Sit to Supine Rolling: Min guard Sidelying to sit: Min assist   Sit to supine: Min assist   General bed mobility comments: min A for verbal cues to sequence the task, and for trunk elevation    Transfers Overall  transfer level: Needs assistance Equipment used: Rolling walker (2 wheeled) Transfers: Sit to/from Stand Sit to Stand: Min assist         General transfer comment: verbal cues for hand position and placement of RW    Balance Overall balance assessment: Needs assistance Sitting-balance support: No upper extremity supported;Feet supported Sitting balance-Leahy Scale: Fair     Standing balance support: Single extremity supported;During functional activity Standing balance-Leahy Scale: Fair Standing balance comment: one mild LOB while standing to groom at the sink; pt was able to self correct                           ADL either performed or assessed with clinical judgement   ADL Overall ADL's : Needs assistance/impaired     Grooming: Wash/dry hands;Brushing hair;Standing;Minimal assistance Grooming Details (indicate cue type and reason): min A for grooming at the sink for physical assist, verbal cues for safety and sequencing task                             Functional mobility during ADLs: Minimal assistance;Rolling walker;Cueing for safety;Cueing for sequencing General ADL Comments: increased verbal cues for RW management and positioning with limited carryover throughout entrie session. Pt attempting to step outside of the walker and pushing it away during tasks. Pt was perseverating on calling her daughter and difficult to re-direct and pt needed incrased cueing throguhout to sequencing grooing tasks     Vision   Vision Assessment?: Vision impaired- to  be further tested in functional context   Perception     Praxis      Cognition Arousal/Alertness: Awake/alert Behavior During Therapy: Impulsive Overall Cognitive Status: Impaired/Different from baseline Area of Impairment: Attention;Following commands;Safety/judgement;Awareness;Problem solving                   Current Attention Level: Sustained   Following Commands: Follows one step  commands inconsistently Safety/Judgement: Decreased awareness of safety;Decreased awareness of deficits Awareness: Emergent Problem Solving: Slow processing;Decreased initiation;Difficulty sequencing;Requires verbal cues General Comments: Pt hyperfocused on calling her daugher, very difficult to re-direct to task. impulsive with mobility throughout. Poor management of RW required multimodial cueing. Limited problem solving and insight into deficits. Pt asked to wash hand and then apply loition, pt used loition to wash hands, when corrected she said "oh, it works too" and did not correct task.        Exercises     Shoulder Instructions       General Comments VSS on RA; dialed pt's daughter for her at the end of the session, also wrote her number down to keep with pt    Pertinent Vitals/ Pain       Pain Assessment: Faces Faces Pain Scale: Hurts a little bit Pain Intervention(s): Limited activity within patient's tolerance;Monitored during session;Repositioned   Frequency  Min 2X/week        Progress Toward Goals  OT Goals(current goals can now be found in the care plan section)  Progress towards OT goals: Progressing toward goals  Acute Rehab OT Goals Patient Stated Goal: go home with daughter OT Goal Formulation: With patient/family Time For Goal Achievement: 09/13/21 Potential to Achieve Goals: Good ADL Goals Pt Will Perform Lower Body Dressing: with min guard assist;sit to/from stand Pt Will Transfer to Toilet: with min guard assist;regular height toilet;grab bars;ambulating Pt Will Perform Toileting - Clothing Manipulation and hygiene: with min guard assist;sit to/from stand Additional ADL Goal #1: Patient will stand at sink to perform grooming task as evidence of improving activity tolerance and balance Additional ADL Goal #2: Patient will perform 10 min functional activity or exercise activity as evidence of improving activity tolerance  Plan Discharge plan remains  appropriate       AM-PAC OT "6 Clicks" Daily Activity     Outcome Measure   Help from another person eating meals?: A Little Help from another person taking care of personal grooming?: A Little Help from another person toileting, which includes using toliet, bedpan, or urinal?: A Lot Help from another person bathing (including washing, rinsing, drying)?: A Lot Help from another person to put on and taking off regular upper body clothing?: A Lot Help from another person to put on and taking off regular lower body clothing?: A Lot 6 Click Score: 14    End of Session Equipment Utilized During Treatment: Rolling walker  OT Visit Diagnosis: Unsteadiness on feet (R26.81);History of falling (Z91.81);Muscle weakness (generalized) (M62.81);Other symptoms and signs involving cognitive function   Activity Tolerance Patient tolerated treatment well   Patient Left in bed;with bed alarm set;with call bell/phone within reach   Nurse Communication Mobility status        Time: 1115 (1115)-1135 OT Time Calculation (min): 20 min  Charges: OT General Charges $OT Visit: 1 Visit OT Treatments $Self Care/Home Management : 8-22 mins   Lyann Hagstrom A Glorie Dowlen 09/03/2021, 12:26 PM

## 2021-09-03 NOTE — Progress Notes (Signed)
Progress Note    Sonya Duffy  RCV:[id number] DOB: 03-01-1946  DOA: 08/27/2021 PCP: Pcp, No    Brief Narrative:     Medical records reviewed and are as summarized below:  Sonya Duffy is an 75 y.o. female patient was found to be in DKA.  CT head was done which showed chronic appearing right cerebellar infarct, area of extra axial increase attenuation within the right parietal region which may represent partially calcified meningioma.  Neurology was consulted.  MRI brain showed subacute infarct of left paracentral pons, associated microhemorrhage but no malignant hemorrhagic transformation or mass-effect.  Blood cultures positive for MSSA.  Stay complicated with thrush  Assessment/Plan:   Principal Problem:   DKA (diabetic ketoacidosis) ([place]) Active Problems:   Cerebral thrombosis with cerebral infarction   Non-ST elevation (NSTEMI) myocardial infarction ([place])   MSSA bacteremia   Acute vaginitis   Oral thrush   MSSA bacteremia blood culture + for MSSA -back pain resolved -U/A negative X ray negative -abx started -repeated BC NGTD  Thrush/vaginitis -diflucan for 10 days -outpatient GYN follow up  Hypokalemia/hypomagnesemia -replete  Diabetic ketoacidosis -Resolved -lantus/SSI   Acute Stroke -MRI brain showed left pontine infarct -Neurology consulted -2D echo: Left ventricular ejection fraction, by estimation, is 50 to 55%. The  left ventricle has low normal function. The left ventricle demonstrates  regional wall motion abnormalities  -Hemoglobin A1c 13.3 -Neurology recommends aspirin and Plavix for 3 weeks and then continue aspirin alone  NSTEMI -Troponin elevated at 653; 657; 967; 1001 -Cardiology consulted:  Repeat limited echo shows apical akinesis without thrombus (Contrast used). Suspect she had a distal LAD infarct. Probably no need to consider cath in the future -  myoview to look for any reversible ischemia as an outpatient. Agree with DAPT  per neuro - technically, since this was NSTEMI - there is evidence for mortality benefit with 1 year of DAPT with Plavix for medical therapy of NSTEMI. Would consider longer course.     Metabolic encephalopathy -Multifactorial, likely from diabetic ketoacidosis, stroke, NSTEMI -seems to be at baseline   Partially calcified meningioma -Seen on CT head and MRI brain -No mass-effect seen, no intervention recommended   Acute kidney injury -Likely from poor p.o. intake -Creatinine was 2.38 on admission -Improved   B12 deficiency -B12 was found to be 147 -Started on 1000 mcg IM daily for 7 days followed by 1000 mcg p.o. daily        Family Communication/Anticipated D/C date and plan/Code Status   DVT prophylaxis: Lovenox ordered. Code Status: Full Code.  Family Communication: spoke with daughter 9/26 Disposition Plan: Status is: Inpatient  Remains inpatient appropriate because:Inpatient level of care appropriate due to severity of illness  Dispo: The patient is from: Home              Anticipated d/c is to: home with daughter              Patient currently is not medically stable to d/c. D/c Wednesday   Difficult to place patient No         Medical Consultants:   Neuro Cards ID (automatic consult)  Subjective:   No SOB, pressure there but itching better  Objective:    Vitals:   09/02/21 2314 09/03/21 0336 09/03/21 0726 09/03/21 1153  BP: 128/80 (!) 148/71 (!) 160/81 96/65  Pulse: 68 86 84 68  Resp: 14 17 16 15   Temp: 98.1 F (36.7 C) 97.7 F (36.5 C) 99 F (  37.2 C) (!) 97.1 F (36.2 C)  TempSrc: Oral Oral Axillary Oral  SpO2: 96% 95% 96% 97%  Weight:  71.4 kg    Height:        Intake/Output Summary (Last 24 hours) at 09/03/2021 1543 Last data filed at 09/02/2021 1841 Gross per 24 hour  Intake 240 ml  Output --  Net 240 ml   Filed Weights   08/27/21 1557 09/02/21 0400 09/03/21 0336  Weight: 69.9 kg 71 kg 71.4 kg    Exam:    General:  Appearance:     Overweight female in no acute distress     Lungs:     respirations unlabored  Heart:    Normal heart rate.    MS:   All extremities are intact.    Neurologic:   Awake, alert, able to make needs known    Data Reviewed:   I have personally reviewed following labs and imaging studies:  Labs: Labs show the following:   Basic Metabolic Panel: Recent Labs  Lab 08/30/21 0448 08/31/21 0721 09/01/21 0127 09/01/21 1721 09/02/21 0030 09/03/21 1030  NA 141  --  138 138 140 135  K 3.0*  --  2.7* 4.9 4.8 4.2  CL 107  --  103 106 107 101  CO2 23  --  25 19* 23 24  GLUCOSE 141*  --  89 133* 118* 232*  BUN 14  --  14 18 15 13   CREATININE 1.39*  --  1.25* 1.14* 1.14* 1.26*  CALCIUM 9.7  --  9.0 8.9 8.9 8.4*  MG 1.1* 1.4* 2.1  --  1.8  --    GFR Estimated Creatinine Clearance: 34.6 mL/min (A) (by C-G formula based on SCr of 1.26 mg/dL (H)). Liver Function Tests: Recent Labs  Lab 09/03/21 1030  AST 20  ALT <5  ALKPHOS 476*  BILITOT 0.4  PROT 5.9*  ALBUMIN 1.7*   No results for input(s): LIPASE, AMYLASE in the last 168 hours. Recent Labs  Lab 08/28/21 0451  AMMONIA 20   Coagulation profile No results for input(s): INR, PROTIME in the last 168 hours.   CBC: Recent Labs  Lab 09/01/21 0127 09/02/21 0030 09/03/21 1030  WBC 15.9* 18.1* 15.9*  HGB 11.1* 10.8* 9.3*  HCT 33.3* 33.1* 29.1*  MCV 84.5 85.8 86.9  PLT 338 339 292   Cardiac Enzymes: Recent Labs  Lab 08/29/21 0035  CKTOTAL 51   BNP (last 3 results) No results for input(s): PROBNP in the last 8760 hours. CBG: Recent Labs  Lab 09/02/21 1121 09/02/21 1615 09/02/21 2030 09/03/21 0729 09/03/21 1152  GLUCAP 186* 184* 239* 89 209*   D-Dimer: No results for input(s): DDIMER in the last 72 hours. Hgb A1c: No results for input(s): HGBA1C in the last 72 hours.  Lipid Profile: No results for input(s): CHOL, HDL, LDLCALC, TRIG, CHOLHDL, LDLDIRECT in the last 72 hours.  Thyroid function  studies: No results for input(s): TSH, T4TOTAL, T3FREE, THYROIDAB in the last 72 hours.  Invalid input(s): FREET3  Anemia work up: No results for input(s): VITAMINB12, FOLATE, FERRITIN, TIBC, IRON, RETICCTPCT in the last 72 hours.  Sepsis Labs: Recent Labs  Lab 09/01/21 0127 09/02/21 0030 09/03/21 1030  WBC 15.9* 18.1* 15.9*    Microbiology Recent Results (from the past 240 hour(s))  Resp Panel by RT-PCR (Flu A&B, Covid) Nasopharyngeal Swab     Status: None   Collection Time: 08/27/21  3:43 PM   Specimen: Nasopharyngeal Swab; Nasopharyngeal(NP) swabs in vial transport  medium  Result Value Ref Range Status   SARS Coronavirus 2 by RT PCR NEGATIVE NEGATIVE Final    Comment: (NOTE) SARS-CoV-2 target nucleic acids are NOT DETECTED.  The SARS-CoV-2 RNA is generally detectable in upper respiratory specimens during the acute phase of infection. The lowest concentration of SARS-CoV-2 viral copies this assay can detect is 138 copies/mL. A negative result does not preclude SARS-Cov-2 infection and should not be used as the sole basis for treatment or other patient management decisions. A negative result may occur with  improper specimen collection/handling, submission of specimen other than nasopharyngeal swab, presence of viral mutation(s) within the areas targeted by this assay, and inadequate number of viral copies(<138 copies/mL). A negative result must be combined with clinical observations, patient history, and epidemiological information. The expected result is Negative.  Fact Sheet for Patients:  EntrepreneurPulse.com.au  Fact Sheet for Healthcare Providers:  IncredibleEmployment.be  This test is no t yet approved or cleared by the [place] FDA and  has been authorized for detection and/or diagnosis of SARS-CoV-2 by FDA under an Emergency Use Authorization (EUA). This EUA will remain  in effect (meaning this test can be used) for  the duration of the COVID-19 declaration under Section 564(b)(1) of the Act, 21 U.S.C.section 360bbb-3(b)(1), unless the authorization is terminated  or revoked sooner.       Influenza A by PCR NEGATIVE NEGATIVE Final   Influenza B by PCR NEGATIVE NEGATIVE Final    Comment: (NOTE) The Xpert Xpress SARS-CoV-2/FLU/RSV plus assay is intended as an aid in the diagnosis of influenza from Nasopharyngeal swab specimens and should not be used as a sole basis for treatment. Nasal washings and aspirates are unacceptable for Xpert Xpress SARS-CoV-2/FLU/RSV testing.  Fact Sheet for Patients: EntrepreneurPulse.com.au  Fact Sheet for Healthcare Providers: IncredibleEmployment.be  This test is not yet approved or cleared by the [place] FDA and has been authorized for detection and/or diagnosis of SARS-CoV-2 by FDA under an Emergency Use Authorization (EUA). This EUA will remain in effect (meaning this test can be used) for the duration of the COVID-19 declaration under Section 564(b)(1) of the Act, 21 U.S.C. section 360bbb-3(b)(1), unless the authorization is terminated or revoked.  Performed at [facility], [place] [address], [place], [place] [address]   Culture, blood (Routine X 2) w Reflex to ID Panel     Status: None   Collection Time: 08/28/21 11:41 PM   Specimen: BLOOD  Result Value Ref Range Status   Specimen Description BLOOD LEFT ANTECUBITAL  Final   Special Requests AEROBIC BOTTLE ONLY Blood Culture adequate volume  Final   Culture   Final    NO GROWTH 5 DAYS Performed at [facility] [address], [place], [place] [address]    Report Status 09/03/2021 FINAL  Final  Culture, blood (Routine X 2) w Reflex to ID Panel     Status: Abnormal   Collection Time: 08/28/21 11:42 PM   Specimen: BLOOD LEFT FOREARM  Result Value Ref Range Status   Specimen Description BLOOD LEFT FOREARM  Final   Special Requests   Final    BOTTLES  DRAWN AEROBIC AND ANAEROBIC Blood Culture adequate volume   Culture  Setup Time   Final    GRAM POSITIVE COCCI IN CLUSTERS IN BOTH AEROBIC AND ANAEROBIC BOTTLES CRITICAL RESULT CALLED TO, READ BACK BY AND VERIFIED WITH: Burnett Kanaris [id number] AT 1831 BY CM Performed at [facility], [place],  [place] [address]    Culture STAPHYLOCOCCUS AUREUS (A)  Final   Report Status 08/31/2021 FINAL  Final   Organism ID, Bacteria STAPHYLOCOCCUS AUREUS  Final      Susceptibility   Staphylococcus aureus - MIC*    CIPROFLOXACIN <=0.5 SENSITIVE Sensitive     ERYTHROMYCIN >=8 RESISTANT Resistant     GENTAMICIN <=0.5 SENSITIVE Sensitive     OXACILLIN <=0.25 SENSITIVE Sensitive     TETRACYCLINE <=1 SENSITIVE Sensitive     VANCOMYCIN 1 SENSITIVE Sensitive     TRIMETH/SULFA <=10 SENSITIVE Sensitive     CLINDAMYCIN <=0.25 SENSITIVE Sensitive     RIFAMPIN <=0.5 SENSITIVE Sensitive     Inducible Clindamycin NEGATIVE Sensitive     * STAPHYLOCOCCUS AUREUS  Blood Culture ID Panel (Reflexed)     Status: Abnormal   Collection Time: 08/28/21 11:42 PM  Result Value Ref Range Status   Enterococcus faecalis NOT DETECTED NOT DETECTED Final   Enterococcus Faecium NOT DETECTED NOT DETECTED Final   Listeria monocytogenes NOT DETECTED NOT DETECTED Final   Staphylococcus species DETECTED (A) NOT DETECTED Final    Comment: CRITICAL RESULT CALLED TO, READ BACK BY AND VERIFIED WITH: PHARMD G BARR [id number] AT 1830 BY CM    Staphylococcus aureus (BCID) DETECTED (A) NOT DETECTED Final    Comment: CRITICAL RESULT CALLED TO, READ BACK BY AND VERIFIED WITH: PHARMD G BARR [id number] AT 1830 BY CM    Staphylococcus epidermidis NOT DETECTED NOT DETECTED Final   Staphylococcus lugdunensis NOT DETECTED NOT DETECTED Final   Streptococcus species NOT DETECTED NOT DETECTED Final   Streptococcus agalactiae NOT DETECTED NOT DETECTED Final   Streptococcus pneumoniae NOT DETECTED NOT DETECTED Final   Streptococcus pyogenes  NOT DETECTED NOT DETECTED Final   A.calcoaceticus-baumannii NOT DETECTED NOT DETECTED Final   Bacteroides fragilis NOT DETECTED NOT DETECTED Final   Enterobacterales NOT DETECTED NOT DETECTED Final   Enterobacter cloacae complex NOT DETECTED NOT DETECTED Final   Escherichia coli NOT DETECTED NOT DETECTED Final   Klebsiella aerogenes NOT DETECTED NOT DETECTED Final   Klebsiella oxytoca NOT DETECTED NOT DETECTED Final   Klebsiella pneumoniae NOT DETECTED NOT DETECTED Final   Proteus species NOT DETECTED NOT DETECTED Final   Salmonella species NOT DETECTED NOT DETECTED Final   Serratia marcescens NOT DETECTED NOT DETECTED Final   Haemophilus influenzae NOT DETECTED NOT DETECTED Final   Neisseria meningitidis NOT DETECTED NOT DETECTED Final   Pseudomonas aeruginosa NOT DETECTED NOT DETECTED Final   Stenotrophomonas maltophilia NOT DETECTED NOT DETECTED Final   Candida albicans NOT DETECTED NOT DETECTED Final   Candida auris NOT DETECTED NOT DETECTED Final   Candida glabrata NOT DETECTED NOT DETECTED Final   Candida krusei NOT DETECTED NOT DETECTED Final   Candida parapsilosis NOT DETECTED NOT DETECTED Final   Candida tropicalis NOT DETECTED NOT DETECTED Final   Cryptococcus neoformans/gattii NOT DETECTED NOT DETECTED Final   Meth resistant mecA/C and MREJ NOT DETECTED NOT DETECTED Final    Comment: Performed at [facility], [address], [place], [place] [address]  Culture, blood (routine x 2)     Status: None (Preliminary result)   Collection Time: 08/31/21  7:21 AM   Specimen: BLOOD  Result Value Ref Range Status   Specimen Description BLOOD LEFT ANTECUBITAL  Final   Special Requests   Final    BOTTLES DRAWN AEROBIC ONLY Blood Culture results may not be optimal due to an inadequate volume of blood received in culture bottles   Culture  Final    NO GROWTH 3 DAYS Performed at [facility], [place] [address], [place], [place] [address]    Report Status PENDING  Incomplete   Culture, blood (routine x 2)     Status: None (Preliminary result)   Collection Time: 08/31/21  7:25 AM   Specimen: BLOOD LEFT HAND  Result Value Ref Range Status   Specimen Description BLOOD LEFT HAND  Final   Special Requests   Final    BOTTLES DRAWN AEROBIC ONLY Blood Culture results may not be optimal due to an inadequate volume of blood received in culture bottles   Culture   Final    NO GROWTH 3 DAYS Performed at [facility], [place] [address] [place], [place] [address]    Report Status PENDING  Incomplete    Procedures and diagnostic studies:  No results found.  Medications:    aspirin  325 mg Oral Daily   atorvastatin  40 mg Oral Daily   clopidogrel  75 mg Oral Daily   clotrimazole   Topical BID   conjugated estrogens  1 Applicatorful Vaginal QHS   enoxaparin (LOVENOX) injection  40 mg Subcutaneous QHS   fluconazole  100 mg Oral Daily   insulin aspart  0-15 Units Subcutaneous TID AC & HS   [START ON 09/04/2021] insulin glargine-yfgn  28 Units Subcutaneous Daily   metoprolol tartrate  25 mg Oral BID   nystatin  5 mL Oral QID   pantoprazole  40 mg Oral Daily   [START ON 09/04/2021] vitamin B-12  1,000 mcg Oral Daily   Continuous Infusions:   ceFAZolin (ANCEF) IV 2 g (09/03/21 1520)     LOS: 7 days   Geradine Girt  [facility]   How to contact the [facility] Attending or Consulting provider [place] or covering provider during after hours [place], for this patient?  Check the care team in [facility] and look for a) attending/consulting [facility] provider listed and b) the [facility] team listed Log into [email or web address] and use [place]'s universal password to access. If you do not have the password, please contact the hospital operator. Locate the [facility] provider you are looking for under [facility] and page to a number that you can be directly reached. If you still have difficulty reaching the provider, please page the [facility] (Director on Call) for the Hospitalists listed on  amion for assistance.  09/03/2021, 3:43 PM

## 2021-09-03 NOTE — Progress Notes (Signed)
Brief cardiology note  Chart reviewed, last seen by Dr. Johnsie Cancel 9/24 with plans for outpatient follow up.  Active issues:  MSSA bacteremia: -per ID note 9/25 by Dr. Gale Journey, "There is question of need to do tee. In this lady, it would be a reasonable alternative if repeat bcx negative to treat with 2 weeks abx and close monitoring off abx rather than subjecting her to tee"  CVA: -no apical thrombus with echo contrast -not thought to be embolic based on notes -multiple risk factors, including diabetes with DKA, hypertension, hyperlipidemia -would hold on addition of SGLT2i given current fungal infection treatment. GLP1RA with more data for benefit in stroke. Reassess as outpatient as she is on insulin  NSTEMI: -recommended for outpatient nuclear stress test -continue aspirin, clopidogrel, statin -started on beta blocker this admission  Hypertension: -ARB held due to AKI.  -labs pending today, but if renal function stable, would restart losartan  [facility] will sign off.   Medication Recommendations:  medications as above Other recommendations (labs, testing, etc):  BMET as outpatient, stress test as outpatient Follow up as an outpatient:  Has appt with Laurann Montana, NP on 10/03/21   Buford Dresser, MD, PhD, [facility] at [facility] at [facility] [address], [place], [place] [address] [phone]

## 2021-09-03 NOTE — Progress Notes (Signed)
Physical Therapy Treatment Patient Details Name: Sonya Duffy MRN: [id number] DOB: [birth year] Today's Date: 09/03/2021   History of Present Illness Sonya Duffy is an 75 y.o. female admitted on 08/27/21 and patient was found to be in DKA. Also with metabolic encephalopathy, NSTEMI and AKI. 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 Pt with hx of DM and HTN.    PT Comments    Pt progressing incrementally towards her physical therapy goals. Ambulating room distances with a walker at a min assist level. Performed serial sit to stands for functional strengthening. Continues with decreased cognition including problem solving abilities, orientation, awareness, and attention. D/c plan remains appropriate.     Recommendations for follow up therapy are one component of a multi-disciplinary discharge planning process, led by the attending physician.  Recommendations may be updated based on patient status, additional functional criteria and insurance authorization.  Follow Up Recommendations  Home health PT;Supervision/Assistance - 24 hour     Equipment Recommendations  Rolling walker with 5" wheels;3in1 (PT)    Recommendations for Other Services       Precautions / Restrictions Precautions Precautions: Fall Restrictions Weight Bearing Restrictions: No     Mobility  Bed Mobility Overal bed mobility: Needs Assistance Bed Mobility: Supine to Sit Rolling: Min guard Sidelying to sit: Min assist Supine to sit: Min assist Sit to supine: Min assist   General bed mobility comments: Assist for trunk elevation    Transfers Overall transfer level: Needs assistance Equipment used: Rolling walker (2 wheeled) Transfers: Sit to/from Stand Sit to Stand: Min assist         General transfer comment: verbal cues for hand position and placement of RW  Ambulation/Gait Ambulation/Gait assistance: Min assist Gait Distance (Feet): 30 Feet Assistive device: Rolling walker  (2 wheeled) Gait Pattern/deviations: Step-through pattern;Decreased stride length;Trunk flexed Gait velocity: decreased Gait velocity interpretation: <1.8 ft/sec, indicate of risk for recurrent falls General Gait Details: Poor management of RW, tendency to keep walker too far forward and not keeping feet on inside of walker with turns. Requiring minA for balance   Stairs             Wheelchair Mobility    Modified Rankin (Stroke Patients Only) Modified Rankin (Stroke Patients Only) Pre-Morbid Rankin Score: No significant disability Modified Rankin: Moderately severe disability     Balance Overall balance assessment: Needs assistance Sitting-balance support: No upper extremity supported;Feet supported Sitting balance-Leahy Scale: Fair     Standing balance support: Single extremity supported;During functional activity Standing balance-Leahy Scale: Poor Standing balance comment: one mild LOB while standing to groom at the sink; pt was able to self correct                            Cognition Arousal/Alertness: Awake/alert Behavior During Therapy: Impulsive Overall Cognitive Status: Impaired/Different from baseline Area of Impairment: Attention;Following commands;Safety/judgement;Awareness;Problem solving                   Current Attention Level: Sustained   Following Commands: Follows one step commands inconsistently Safety/Judgement: Decreased awareness of safety;Decreased awareness of deficits Awareness: Emergent Problem Solving: Slow processing;Decreased initiation;Difficulty sequencing;Requires verbal cues General Comments: Poor management of RW required multimodial cueing. Limited problem solving and insight into deficits. Difficulty locating soap at sink. A&Ox2 (self, place)      Exercises Other Exercises Other Exercises: x5 Sit to stands    General Comments General comments (skin integrity, edema, etc.): VSS  on RA; dialed pt's daughter  for her at the end of the session, also wrote her number down to keep with pt      Pertinent Vitals/Pain Pain Assessment: Faces Faces Pain Scale: No hurt Pain Intervention(s): Limited activity within patient's tolerance;Monitored during session;Repositioned    Home Living                      Prior Function            PT Goals (current goals can now be found in the care plan section) Acute Rehab PT Goals Patient Stated Goal: go home with daughter Potential to Achieve Goals: Good Progress towards PT goals: Progressing toward goals    Frequency    Min 3X/week      PT Plan Frequency needs to be updated    Co-evaluation              AM-PAC PT "6 Clicks" Mobility   Outcome Measure  Help needed turning from your back to your side while in a flat bed without using bedrails?: A Little Help needed moving from lying on your back to sitting on the side of a flat bed without using bedrails?: A Little Help needed moving to and from a bed to a chair (including a wheelchair)?: A Little Help needed standing up from a chair using your arms (e.g., wheelchair or bedside chair)?: A Little Help needed to walk in hospital room?: A Little Help needed climbing 3-5 steps with a railing? : A Lot 6 Click Score: 17    End of Session Equipment Utilized During Treatment: Gait belt Activity Tolerance: Patient tolerated treatment well Patient left: in chair;with call bell/phone within reach;with chair alarm set Nurse Communication: Mobility status PT Visit Diagnosis: Unsteadiness on feet (R26.81);Muscle weakness (generalized) (M62.81)     Time: 6195-0932 PT Time Calculation (min) (ACUTE ONLY): 20 min  Charges:  $Therapeutic Activity: 8-22 mins                     Wyona Almas, PT, DPT Acute Rehabilitation Services Pager [phone] Office [phone]    Deno Etienne 09/03/2021, 2:51 PM

## 2021-09-03 NOTE — Progress Notes (Signed)
[facility]   Reason for visit: Follow up on bacteremia  Interval History: no complaints voiced; afebrile, WBC 15.9;  Repeat blood cultures ngtd, three days Previous blood cultures 2/3 with MSSA   Physical Exam: Constitutional:  Vitals:   09/03/21 0726 09/03/21 1153  BP: (!) 160/81 96/65  Pulse: 84 68  Resp: 16 15  Temp: 99 F (37.2 C) (!) 97.1 F (36.2 C)  SpO2: 96% 97%   patient appears in NAD Respiratory: Normal respiratory effort; CTA B Cardiovascular: RRR MS: no edema Neuro: alert, confused  Review of Systems: Constitutional: negative for fevers and chills Gastrointestinal: negative for nausea and diarrhea  Lab Results  Component Value Date   WBC 15.9 (H) 09/03/2021   HGB 9.3 (L) 09/03/2021   HCT 29.1 (L) 09/03/2021   MCV 86.9 09/03/2021   PLT 292 09/03/2021    Lab Results  Component Value Date   CREATININE 1.26 (H) 09/03/2021   BUN 13 09/03/2021   NA 135 09/03/2021   K 4.2 09/03/2021   CL 101 09/03/2021   CO2 24 09/03/2021    Lab Results  Component Value Date   ALT <5 09/03/2021   AST 20 09/03/2021   ALKPHOS 476 (H) 09/03/2021     Microbiology: Recent Results (from the past 240 hour(s))  Resp Panel by RT-PCR (Flu A&B, Covid) Nasopharyngeal Swab     Status: None   Collection Time: 08/27/21  3:43 PM   Specimen: Nasopharyngeal Swab; Nasopharyngeal(NP) swabs in vial transport medium  Result Value Ref Range Status   SARS Coronavirus 2 by RT PCR NEGATIVE NEGATIVE Final    Comment: (NOTE) SARS-CoV-2 target nucleic acids are NOT DETECTED.  The SARS-CoV-2 RNA is generally detectable in upper respiratory specimens during the acute phase of infection. The lowest concentration of SARS-CoV-2 viral copies this assay can detect is 138 copies/mL. A negative result does not preclude SARS-Cov-2 infection and should not be used as the sole basis for treatment or other patient management decisions. A negative result may occur with   improper specimen collection/handling, submission of specimen other than nasopharyngeal swab, presence of viral mutation(s) within the areas targeted by this assay, and inadequate number of viral copies(<138 copies/mL). A negative result must be combined with clinical observations, patient history, and epidemiological information. The expected result is Negative.  Fact Sheet for Patients:  EntrepreneurPulse.com.au  Fact Sheet for Healthcare Providers:  IncredibleEmployment.be  This test is no t yet approved or cleared by the [place] FDA and  has been authorized for detection and/or diagnosis of SARS-CoV-2 by FDA under an Emergency Use Authorization (EUA). This EUA will remain  in effect (meaning this test can be used) for the duration of the COVID-19 declaration under Section 564(b)(1) of the Act, 21 U.S.C.section 360bbb-3(b)(1), unless the authorization is terminated  or revoked sooner.       Influenza A by PCR NEGATIVE NEGATIVE Final   Influenza B by PCR NEGATIVE NEGATIVE Final    Comment: (NOTE) The Xpert Xpress SARS-CoV-2/FLU/RSV plus assay is intended as an aid in the diagnosis of influenza from Nasopharyngeal swab specimens and should not be used as a sole basis for treatment. Nasal washings and aspirates are unacceptable for Xpert Xpress SARS-CoV-2/FLU/RSV testing.  Fact Sheet for Patients: EntrepreneurPulse.com.au  Fact Sheet for Healthcare Providers: IncredibleEmployment.be  This test is not yet approved or cleared by the [place] FDA and has been authorized for detection and/or diagnosis of SARS-CoV-2 by FDA under an Emergency Use  Authorization (EUA). This EUA will remain in effect (meaning this test can be used) for the duration of the COVID-19 declaration under Section 564(b)(1) of the Act, 21 U.S.C. section 360bbb-3(b)(1), unless the authorization is terminated  or revoked.  Performed at [facility], [place] [address], [place], [place] [address]   Culture, blood (Routine X 2) w Reflex to ID Panel     Status: None   Collection Time: 08/28/21 11:41 PM   Specimen: BLOOD  Result Value Ref Range Status   Specimen Description BLOOD LEFT ANTECUBITAL  Final   Special Requests AEROBIC BOTTLE ONLY Blood Culture adequate volume  Final   Culture   Final    NO GROWTH 5 DAYS Performed at [facility] [address], [place], [place] [address]    Report Status 09/03/2021 FINAL  Final  Culture, blood (Routine X 2) w Reflex to ID Panel     Status: Abnormal   Collection Time: 08/28/21 11:42 PM   Specimen: BLOOD LEFT FOREARM  Result Value Ref Range Status   Specimen Description BLOOD LEFT FOREARM  Final   Special Requests   Final    BOTTLES DRAWN AEROBIC AND ANAEROBIC Blood Culture adequate volume   Culture  Setup Time   Final    GRAM POSITIVE COCCI IN CLUSTERS IN BOTH AEROBIC AND ANAEROBIC BOTTLES CRITICAL RESULT CALLED TO, READ BACK BY AND VERIFIED WITH: Burnett Kanaris [id number] AT 1831 BY CM Performed at [facility], [place] [address] [place], [place] [address]    Culture STAPHYLOCOCCUS AUREUS (A)  Final   Report Status 08/31/2021 FINAL  Final   Organism ID, Bacteria STAPHYLOCOCCUS AUREUS  Final      Susceptibility   Staphylococcus aureus - MIC*    CIPROFLOXACIN <=0.5 SENSITIVE Sensitive     ERYTHROMYCIN >=8 RESISTANT Resistant     GENTAMICIN <=0.5 SENSITIVE Sensitive     OXACILLIN <=0.25 SENSITIVE Sensitive     TETRACYCLINE <=1 SENSITIVE Sensitive     VANCOMYCIN 1 SENSITIVE Sensitive     TRIMETH/SULFA <=10 SENSITIVE Sensitive     CLINDAMYCIN <=0.25 SENSITIVE Sensitive     RIFAMPIN <=0.5 SENSITIVE Sensitive     Inducible Clindamycin NEGATIVE Sensitive     * STAPHYLOCOCCUS AUREUS  Blood Culture ID Panel (Reflexed)     Status: Abnormal   Collection Time: 08/28/21 11:42 PM  Result Value Ref Range Status   Enterococcus faecalis NOT  DETECTED NOT DETECTED Final   Enterococcus Faecium NOT DETECTED NOT DETECTED Final   Listeria monocytogenes NOT DETECTED NOT DETECTED Final   Staphylococcus species DETECTED (A) NOT DETECTED Final    Comment: CRITICAL RESULT CALLED TO, READ BACK BY AND VERIFIED WITH: PHARMD G BARR [id number] AT 1830 BY CM    Staphylococcus aureus (BCID) DETECTED (A) NOT DETECTED Final    Comment: CRITICAL RESULT CALLED TO, READ BACK BY AND VERIFIED WITH: PHARMD G BARR [id number] AT 1830 BY CM    Staphylococcus epidermidis NOT DETECTED NOT DETECTED Final   Staphylococcus lugdunensis NOT DETECTED NOT DETECTED Final   Streptococcus species NOT DETECTED NOT DETECTED Final   Streptococcus agalactiae NOT DETECTED NOT DETECTED Final   Streptococcus pneumoniae NOT DETECTED NOT DETECTED Final   Streptococcus pyogenes NOT DETECTED NOT DETECTED Final   A.calcoaceticus-baumannii NOT DETECTED NOT DETECTED Final   Bacteroides fragilis NOT DETECTED NOT DETECTED Final   Enterobacterales NOT DETECTED NOT DETECTED Final   Enterobacter cloacae complex NOT DETECTED NOT DETECTED Final   Escherichia coli NOT DETECTED  NOT DETECTED Final   Klebsiella aerogenes NOT DETECTED NOT DETECTED Final   Klebsiella oxytoca NOT DETECTED NOT DETECTED Final   Klebsiella pneumoniae NOT DETECTED NOT DETECTED Final   Proteus species NOT DETECTED NOT DETECTED Final   Salmonella species NOT DETECTED NOT DETECTED Final   Serratia marcescens NOT DETECTED NOT DETECTED Final   Haemophilus influenzae NOT DETECTED NOT DETECTED Final   Neisseria meningitidis NOT DETECTED NOT DETECTED Final   Pseudomonas aeruginosa NOT DETECTED NOT DETECTED Final   Stenotrophomonas maltophilia NOT DETECTED NOT DETECTED Final   Candida albicans NOT DETECTED NOT DETECTED Final   Candida auris NOT DETECTED NOT DETECTED Final   Candida glabrata NOT DETECTED NOT DETECTED Final   Candida krusei NOT DETECTED NOT DETECTED Final   Candida parapsilosis NOT DETECTED NOT DETECTED  Final   Candida tropicalis NOT DETECTED NOT DETECTED Final   Cryptococcus neoformans/gattii NOT DETECTED NOT DETECTED Final   Meth resistant mecA/C and MREJ NOT DETECTED NOT DETECTED Final    Comment: Performed at [facility], [place] [address], [place], [place] [address]  Culture, blood (routine x 2)     Status: None (Preliminary result)   Collection Time: 08/31/21  7:21 AM   Specimen: BLOOD  Result Value Ref Range Status   Specimen Description BLOOD LEFT ANTECUBITAL  Final   Special Requests   Final    BOTTLES DRAWN AEROBIC ONLY Blood Culture results may not be optimal due to an inadequate volume of blood received in culture bottles   Culture   Final    NO GROWTH 3 DAYS Performed at [facility], [place] [address], [place], [place] [address]    Report Status PENDING  Incomplete  Culture, blood (routine x 2)     Status: None (Preliminary result)   Collection Time: 08/31/21  7:25 AM   Specimen: BLOOD LEFT HAND  Result Value Ref Range Status   Specimen Description BLOOD LEFT HAND  Final   Special Requests   Final    BOTTLES DRAWN AEROBIC ONLY Blood Culture results may not be optimal due to an inadequate volume of blood received in culture bottles   Culture   Final    NO GROWTH 3 DAYS Performed at [facility], [place] [address], [place], [place] [address]    Report Status PENDING  Incomplete    Impression/Plan:  1. Bacteremia - MSSA in blood cultures and repeated blood cultures ngtd.  TTe without concerns for vegetation.  Plan to continue with IV antibiotics for 2 weeks total from negative blood culture 08/31/21.  On cefazolin and will continue.   2.  Confusion - on exam she is pleasant but difficult for me to interpret if she will be capable of home IV antibiotics.  I will defer picc line for now pending further discussion though she would benefit from treatment of above with IV cefazolin.    3.  Renal insufficiency - mild and creat 1.26.  stable from her baseline.

## 2021-09-04 ENCOUNTER — Inpatient Hospital Stay: Payer: Self-pay

## 2021-09-04 DIAGNOSIS — N179 Acute kidney failure, unspecified: Secondary | ICD-10-CM | POA: Diagnosis not present

## 2021-09-04 DIAGNOSIS — B9561 Methicillin susceptible Staphylococcus aureus infection as the cause of diseases classified elsewhere: Secondary | ICD-10-CM | POA: Diagnosis not present

## 2021-09-04 DIAGNOSIS — N760 Acute vaginitis: Secondary | ICD-10-CM | POA: Diagnosis not present

## 2021-09-04 DIAGNOSIS — E0810 Diabetes mellitus due to underlying condition with ketoacidosis without coma: Secondary | ICD-10-CM | POA: Diagnosis not present

## 2021-09-04 DIAGNOSIS — E1110 Type 2 diabetes mellitus with ketoacidosis without coma: Secondary | ICD-10-CM | POA: Diagnosis not present

## 2021-09-04 DIAGNOSIS — R7881 Bacteremia: Secondary | ICD-10-CM | POA: Diagnosis not present

## 2021-09-04 LAB — CBC
HCT: 38.4 % (ref 36.0–46.0)
Hemoglobin: 12.2 g/dL (ref 12.0–15.0)
MCH: 27.7 pg (ref 26.0–34.0)
MCHC: 31.8 g/dL (ref 30.0–36.0)
MCV: 87.1 fL (ref 80.0–100.0)
Platelets: 380 10*3/uL (ref 150–400)
RBC: 4.41 MIL/uL (ref 3.87–5.11)
RDW: 14.5 % (ref 11.5–15.5)
WBC: 16.4 10*3/uL — ABNORMAL HIGH (ref 4.0–10.5)
nRBC: 0 % (ref 0.0–0.2)

## 2021-09-04 LAB — GLUCOSE, CAPILLARY
Glucose-Capillary: 74 mg/dL (ref 70–99)
Glucose-Capillary: 239 mg/dL — ABNORMAL HIGH (ref 70–99)
Glucose-Capillary: 262 mg/dL — ABNORMAL HIGH (ref 70–99)
Glucose-Capillary: 111 mg/dL — ABNORMAL HIGH (ref 70–99)
Glucose-Capillary: 265 mg/dL — ABNORMAL HIGH (ref 70–99)

## 2021-09-04 LAB — BASIC METABOLIC PANEL
Anion gap: 11 (ref 5–15)
BUN: 15 mg/dL (ref 8–23)
CO2: 24 mmol/L (ref 22–32)
Calcium: 9.3 mg/dL (ref 8.9–10.3)
Chloride: 100 mmol/L (ref 98–111)
Creatinine, Ser: 1.15 mg/dL — ABNORMAL HIGH (ref 0.44–1.00)
GFR, Estimated: 49 mL/min — ABNORMAL LOW (ref >60)
Glucose, Bld: 94 mg/dL (ref 70–99)
Potassium: 4.2 mmol/L (ref 3.5–5.1)
Sodium: 135 mmol/L (ref 135–145)

## 2021-09-04 MED ORDER — SODIUM CHLORIDE 0.9% FLUSH: 10.0000 mL | INTRAVENOUS | Status: DC | PRN

## 2021-09-04 MED ORDER — TRAZODONE HCL 50 MG PO TABS
25.0000 mg | ORAL_TABLET | Freq: Every evening | ORAL | Status: DC | PRN
  Administered 2021-09-04: 25 mg via ORAL
  Filled 2021-09-04: qty 1

## 2021-09-04 MED ORDER — INSULIN ASPART 100 UNIT/ML IJ SOLN
5.0000 [IU] | Freq: Three times a day (TID) | INTRAMUSCULAR | Status: DC
  Administered 2021-09-04 – 2021-09-05 (×3): 5 [IU] via SUBCUTANEOUS

## 2021-09-04 MED ORDER — INSULIN GLARGINE-YFGN 100 UNIT/ML ~~LOC~~ SOLN
25.0000 [IU] | Freq: Every day | SUBCUTANEOUS | Status: DC
  Administered 2021-09-05: 25 [IU] via SUBCUTANEOUS
  Filled 2021-09-04 (×2): qty 0.25

## 2021-09-04 MED ORDER — CHLORHEXIDINE GLUCONATE CLOTH 2 % EX PADS
6.0000 | MEDICATED_PAD | Freq: Every day | CUTANEOUS | Status: DC
  Administered 2021-09-04 – 2021-09-05 (×2): 6 via TOPICAL

## 2021-09-04 MED ORDER — MAGNESIUM SULFATE 2 GM/50ML IV SOLN
2.0000 g | Freq: Once | INTRAVENOUS | Status: AC
  Administered 2021-09-04: 2 g via INTRAVENOUS
  Filled 2021-09-04: qty 50

## 2021-09-04 NOTE — Progress Notes (Signed)
Progress Note    Sonya Duffy  XBM:[id number] DOB: 1946/01/07  DOA: 08/27/2021 PCP: Pcp, No    Brief Narrative:     Medical records reviewed and are as summarized below:  Sonya Duffy is an 75 y.o. female patient was found to be in DKA.  CT head was done which showed chronic appearing right cerebellar infarct, area of extra axial increase attenuation within the right parietal region which may represent partially calcified meningioma.  Neurology was consulted.  MRI brain showed subacute infarct of left paracentral pons, associated microhemorrhage but no malignant hemorrhagic transformation or mass-effect.  Blood cultures positive for MSSA.  Stay complicated with thrush/vaginitis.   Assessment/Plan:   Principal Problem:   DKA (diabetic ketoacidosis) ([place]) Active Problems:   Cerebral thrombosis with cerebral infarction   Non-ST elevation (NSTEMI) myocardial infarction (HCC)   MSSA bacteremia   Acute vaginitis   Oral thrush   MSSA bacteremia blood culture + for MSSA -back pain resolved so doubt discitis -repeated BC NGTD -PICC Line ordered -ID consult: no clear source, Continue Cefazolin through 09/14/21  Thrush/vaginitis -diflucan for 10 days -outpatient GYN follow up  Hypokalemia/hypomagnesemia -replete  Diabetic ketoacidosis -Resolved -but HgbA1c shows blood sugars were not well controlled at home: 13.3 -lantus/SSI- daughter can give shots   Acute Stroke -MRI brain showed left pontine infarct -Neurology consulted -2D echo: Left ventricular ejection fraction, by estimation, is 50 to 55%. The  left ventricle has low normal function. The left ventricle demonstrates  regional wall motion abnormalities  -Hemoglobin A1c 13.3 -Neurology recommends aspirin and Plavix for 3 weeks and then continue aspirin alone  NSTEMI -Troponin elevated at 653; 657; 967; 1001 -Cardiology consulted:  Repeat limited echo shows apical akinesis without thrombus (Contrast used).  Suspect she had a distal LAD infarct. Probably no need to consider cath in the future -  myoview as an outpatient. Agree with DAPT per neuro - technically, since this was NSTEMI - there is evidence for mortality benefit with 1 year of DAPT with Plavix for medical therapy of NSTEMI. Would consider longer course.   Metabolic encephalopathy -Multifactorial, likely from diabetic ketoacidosis, stroke, NSTEMI -seems to be at baseline   Partially calcified meningioma -Seen on CT head and MRI brain -No mass-effect seen, no intervention recommended   Acute kidney injury -Likely from poor p.o. intake -Creatinine was 2.38 on admission -Improved   B12 deficiency -B12 was found to be 147 -Started on 1000 mcg IM daily for 7 days followed by 1000 mcg p.o. daily        Family Communication/Anticipated D/C date and plan/Code Status   DVT prophylaxis: Lovenox ordered. Code Status: Full Code.  Family Communication: spoke with daughter 9/26- plan for d/c on 9/28 after DME equipment delivered and PICC Line placed Disposition Plan: Status is: Inpatient  Remains inpatient appropriate because:Inpatient level of care appropriate due to severity of illness  Dispo: The patient is from: Home              Anticipated d/c is to: home with daughter              Patient currently is not medically stable to d/c. D/c Wednesday   Difficult to place patient No         Medical Consultants:   Neuro Cards ID (automatic consult)  Subjective:   Itching better but not sleeping at night  Objective:    Vitals:   09/03/21 2334 09/04/21 0400 09/04/21 0746 09/04/21 1201  BP: Marland Kitchen)  142/59 (!) 160/61 (!) 160/61 (!) 130/57  Pulse: 68 67 86 62  Resp: 16 15 18 15   Temp: 98.1 F (36.7 C) 98 F (36.7 C) 98.6 F (37 C) (!) 97.4 F (36.3 C)  TempSrc: Oral Oral Oral Oral  SpO2: 100% 95% 97% 98%  Weight:      Height:        Intake/Output Summary (Last 24 hours) at 09/04/2021 1611 Last data filed at  09/04/2021 0536 Gross per 24 hour  Intake 600 ml  Output --  Net 600 ml   Filed Weights   08/27/21 1557 09/02/21 0400 09/03/21 0336  Weight: 69.9 kg 71 kg 71.4 kg    Exam:   General: Appearance:     Overweight female in no acute distress     Lungs:      respirations unlabored  Heart:    Normal heart rate.    MS:   All extremities are intact.    Neurologic:   Awake, alert, poor insight into disease processes      Data Reviewed:   I have personally reviewed following labs and imaging studies:  Labs: Labs show the following:   Basic Metabolic Panel: Recent Labs  Lab 08/30/21 0448 08/31/21 0721 09/01/21 0127 09/01/21 1721 09/02/21 0030 09/03/21 1030 09/04/21 0348  NA 141  --  138 138 140 135 135  K 3.0*  --  2.7* 4.9 4.8 4.2 4.2  CL 107  --  103 106 107 101 100  CO2 23  --  25 19* 23 24 24   GLUCOSE 141*  --  89 133* 118* 232* 94  BUN 14  --  14 18 15 13 15   CREATININE 1.39*  --  1.25* 1.14* 1.14* 1.26* 1.15*  CALCIUM 9.7  --  9.0 8.9 8.9 8.4* 9.3  MG 1.1* 1.4* 2.1  --  1.8  --   --    GFR Estimated Creatinine Clearance: 37.9 mL/min (A) (by C-G formula based on SCr of 1.15 mg/dL (H)). Liver Function Tests: Recent Labs  Lab 09/03/21 1030  AST 20  ALT <5  ALKPHOS 476*  BILITOT 0.4  PROT 5.9*  ALBUMIN 1.7*   No results for input(s): LIPASE, AMYLASE in the last 168 hours. No results for input(s): AMMONIA in the last 168 hours.  Coagulation profile No results for input(s): INR, PROTIME in the last 168 hours.   CBC: Recent Labs  Lab 09/01/21 0127 09/02/21 0030 09/03/21 1030 09/04/21 0348  WBC 15.9* 18.1* 15.9* 16.4*  HGB 11.1* 10.8* 9.3* 12.2  HCT 33.3* 33.1* 29.1* 38.4  MCV 84.5 85.8 86.9 87.1  PLT 338 339 292 380   Cardiac Enzymes: Recent Labs  Lab 08/29/21 0035  CKTOTAL 51   BNP (last 3 results) No results for input(s): PROBNP in the last 8760 hours. CBG: Recent Labs  Lab 09/03/21 2046 09/04/21 0529 09/04/21 0751 09/04/21 1128  09/04/21 1606  GLUCAP 177* 74 239* 262* 111*   D-Dimer: No results for input(s): DDIMER in the last 72 hours. Hgb A1c: No results for input(s): HGBA1C in the last 72 hours.  Lipid Profile: No results for input(s): CHOL, HDL, LDLCALC, TRIG, CHOLHDL, LDLDIRECT in the last 72 hours.  Thyroid function studies: No results for input(s): TSH, T4TOTAL, T3FREE, THYROIDAB in the last 72 hours.  Invalid input(s): FREET3  Anemia work up: No results for input(s): VITAMINB12, FOLATE, FERRITIN, TIBC, IRON, RETICCTPCT in the last 72 hours.  Sepsis Labs: Recent Labs  Lab 09/01/21 0127 09/02/21 0030  09/03/21 1030 09/04/21 0348  WBC 15.9* 18.1* 15.9* 16.4*    Microbiology Recent Results (from the past 240 hour(s))  Resp Panel by RT-PCR (Flu A&B, Covid) Nasopharyngeal Swab     Status: None   Collection Time: 08/27/21  3:43 PM   Specimen: Nasopharyngeal Swab; Nasopharyngeal(NP) swabs in vial transport medium  Result Value Ref Range Status   SARS Coronavirus 2 by RT PCR NEGATIVE NEGATIVE Final    Comment: (NOTE) SARS-CoV-2 target nucleic acids are NOT DETECTED.  The SARS-CoV-2 RNA is generally detectable in upper respiratory specimens during the acute phase of infection. The lowest concentration of SARS-CoV-2 viral copies this assay can detect is 138 copies/mL. A negative result does not preclude SARS-Cov-2 infection and should not be used as the sole basis for treatment or other patient management decisions. A negative result may occur with  improper specimen collection/handling, submission of specimen other than nasopharyngeal swab, presence of viral mutation(s) within the areas targeted by this assay, and inadequate number of viral copies(<138 copies/mL). A negative result must be combined with clinical observations, patient history, and epidemiological information. The expected result is Negative.  Fact Sheet for Patients:  EntrepreneurPulse.com.au  Fact Sheet  for Healthcare Providers:  IncredibleEmployment.be  This test is no t yet approved or cleared by the [place] FDA and  has been authorized for detection and/or diagnosis of SARS-CoV-2 by FDA under an Emergency Use Authorization (EUA). This EUA will remain  in effect (meaning this test can be used) for the duration of the COVID-19 declaration under Section 564(b)(1) of the Act, 21 U.S.C.section 360bbb-3(b)(1), unless the authorization is terminated  or revoked sooner.       Influenza A by PCR NEGATIVE NEGATIVE Final   Influenza B by PCR NEGATIVE NEGATIVE Final    Comment: (NOTE) The Xpert Xpress SARS-CoV-2/FLU/RSV plus assay is intended as an aid in the diagnosis of influenza from Nasopharyngeal swab specimens and should not be used as a sole basis for treatment. Nasal washings and aspirates are unacceptable for Xpert Xpress SARS-CoV-2/FLU/RSV testing.  Fact Sheet for Patients: EntrepreneurPulse.com.au  Fact Sheet for Healthcare Providers: IncredibleEmployment.be  This test is not yet approved or cleared by the [place] FDA and has been authorized for detection and/or diagnosis of SARS-CoV-2 by FDA under an Emergency Use Authorization (EUA). This EUA will remain in effect (meaning this test can be used) for the duration of the COVID-19 declaration under Section 564(b)(1) of the Act, 21 U.S.C. section 360bbb-3(b)(1), unless the authorization is terminated or revoked.  Performed at [facility], [place] [address] [place], [place] [address]   Culture, blood (Routine X 2) w Reflex to ID Panel     Status: None   Collection Time: 08/28/21 11:41 PM   Specimen: BLOOD  Result Value Ref Range Status   Specimen Description BLOOD LEFT ANTECUBITAL  Final   Special Requests AEROBIC BOTTLE ONLY Blood Culture adequate volume  Final   Culture   Final    NO GROWTH 5 DAYS Performed at [place] [address], [place], [place] [address]    Report Status 09/03/2021 FINAL  Final  Culture, blood (Routine X 2) w Reflex to ID Panel     Status: Abnormal   Collection Time: 08/28/21 11:42 PM   Specimen: BLOOD LEFT FOREARM  Result Value Ref Range Status   Specimen Description BLOOD LEFT FOREARM  Final   Special Requests   Final    BOTTLES DRAWN AEROBIC AND ANAEROBIC Blood  Culture adequate volume   Culture  Setup Time   Final    GRAM POSITIVE COCCI IN CLUSTERS IN BOTH AEROBIC AND ANAEROBIC BOTTLES CRITICAL RESULT CALLED TO, READ BACK BY AND VERIFIED WITH: Burnett Kanaris [id number] AT 1831 BY CM Performed at [facility], [place] [address], [place], [place] [address]    Culture STAPHYLOCOCCUS AUREUS (A)  Final   Report Status 08/31/2021 FINAL  Final   Organism ID, Bacteria STAPHYLOCOCCUS AUREUS  Final      Susceptibility   Staphylococcus aureus - MIC*    CIPROFLOXACIN <=0.5 SENSITIVE Sensitive     ERYTHROMYCIN >=8 RESISTANT Resistant     GENTAMICIN <=0.5 SENSITIVE Sensitive     OXACILLIN <=0.25 SENSITIVE Sensitive     TETRACYCLINE <=1 SENSITIVE Sensitive     VANCOMYCIN 1 SENSITIVE Sensitive     TRIMETH/SULFA <=10 SENSITIVE Sensitive     CLINDAMYCIN <=0.25 SENSITIVE Sensitive     RIFAMPIN <=0.5 SENSITIVE Sensitive     Inducible Clindamycin NEGATIVE Sensitive     * STAPHYLOCOCCUS AUREUS  Blood Culture ID Panel (Reflexed)     Status: Abnormal   Collection Time: 08/28/21 11:42 PM  Result Value Ref Range Status   Enterococcus faecalis NOT DETECTED NOT DETECTED Final   Enterococcus Faecium NOT DETECTED NOT DETECTED Final   Listeria monocytogenes NOT DETECTED NOT DETECTED Final   Staphylococcus species DETECTED (A) NOT DETECTED Final    Comment: CRITICAL RESULT CALLED TO, READ BACK BY AND VERIFIED WITH: PHARMD G BARR [id number] AT 1830 BY CM    Staphylococcus aureus (BCID) DETECTED (A) NOT DETECTED Final    Comment: CRITICAL RESULT CALLED TO, READ BACK BY AND VERIFIED WITH: PHARMD G BARR [id number] AT 1830  BY CM    Staphylococcus epidermidis NOT DETECTED NOT DETECTED Final   Staphylococcus lugdunensis NOT DETECTED NOT DETECTED Final   Streptococcus species NOT DETECTED NOT DETECTED Final   Streptococcus agalactiae NOT DETECTED NOT DETECTED Final   Streptococcus pneumoniae NOT DETECTED NOT DETECTED Final   Streptococcus pyogenes NOT DETECTED NOT DETECTED Final   A.calcoaceticus-baumannii NOT DETECTED NOT DETECTED Final   Bacteroides fragilis NOT DETECTED NOT DETECTED Final   Enterobacterales NOT DETECTED NOT DETECTED Final   Enterobacter cloacae complex NOT DETECTED NOT DETECTED Final   Escherichia coli NOT DETECTED NOT DETECTED Final   Klebsiella aerogenes NOT DETECTED NOT DETECTED Final   Klebsiella oxytoca NOT DETECTED NOT DETECTED Final   Klebsiella pneumoniae NOT DETECTED NOT DETECTED Final   Proteus species NOT DETECTED NOT DETECTED Final   Salmonella species NOT DETECTED NOT DETECTED Final   Serratia marcescens NOT DETECTED NOT DETECTED Final   Haemophilus influenzae NOT DETECTED NOT DETECTED Final   Neisseria meningitidis NOT DETECTED NOT DETECTED Final   Pseudomonas aeruginosa NOT DETECTED NOT DETECTED Final   Stenotrophomonas maltophilia NOT DETECTED NOT DETECTED Final   Candida albicans NOT DETECTED NOT DETECTED Final   Candida auris NOT DETECTED NOT DETECTED Final   Candida glabrata NOT DETECTED NOT DETECTED Final   Candida krusei NOT DETECTED NOT DETECTED Final   Candida parapsilosis NOT DETECTED NOT DETECTED Final   Candida tropicalis NOT DETECTED NOT DETECTED Final   Cryptococcus neoformans/gattii NOT DETECTED NOT DETECTED Final   Meth resistant mecA/C and MREJ NOT DETECTED NOT DETECTED Final    Comment: Performed at [facility], [address] [place],  [address]  Culture, blood (routine x 2)     Status: None (Preliminary result)   Collection Time: 08/31/21  7:21  AM   Specimen: BLOOD  Result Value Ref Range Status   Specimen Description BLOOD LEFT  ANTECUBITAL  Final   Special Requests   Final    BOTTLES DRAWN AEROBIC ONLY Blood Culture results may not be optimal due to an inadequate volume of blood received in culture bottles   Culture   Final    NO GROWTH 4 DAYS Performed at [place] [address] [place], [place] [address]    Report Status PENDING  Incomplete  Culture, blood (routine x 2)     Status: None (Preliminary result)   Collection Time: 08/31/21  7:25 AM   Specimen: BLOOD LEFT HAND  Result Value Ref Range Status   Specimen Description BLOOD LEFT HAND  Final   Special Requests   Final    BOTTLES DRAWN AEROBIC ONLY Blood Culture results may not be optimal due to an inadequate volume of blood received in culture bottles   Culture   Final    NO GROWTH 4 DAYS Performed at [facility], [place] [address] [place], [place] [address]    Report Status PENDING  Incomplete    Procedures and diagnostic studies:  [place] EKG SITE RITE  Result Date: 09/04/2021 If Site Rite image not attached, placement could not be confirmed due to current cardiac rhythm.   Medications:    aspirin  325 mg Oral Daily   atorvastatin  40 mg Oral Daily   clopidogrel  75 mg Oral Daily   clotrimazole   Topical BID   conjugated estrogens  1 Applicatorful Vaginal QHS   enoxaparin (LOVENOX) injection  40 mg Subcutaneous QHS   fluconazole  100 mg Oral Daily   insulin aspart  0-15 Units Subcutaneous TID AC & HS   insulin aspart  5 Units Subcutaneous TID WC   [START ON 09/05/2021] insulin glargine-yfgn  25 Units Subcutaneous Daily   metoprolol tartrate  25 mg Oral BID   nystatin  5 mL Oral QID   pantoprazole  40 mg Oral Daily   vitamin B-12  1,000 mcg Oral Daily   Continuous Infusions:   ceFAZolin (ANCEF) IV 2 g (09/04/21 1350)     LOS: 8 days   Geradine Girt  [facility]   How to contact the [facility] Attending or Consulting provider [place] or covering provider during after hours [place], for this patient?  Check the care  team in [facility] and look for a) attending/consulting TRH provider listed and b) the [facility] team listed Log into [email or web address] and use [place]'s universal password to access. If you do not have the password, please contact the hospital operator. Locate the [facility] provider you are looking for under [facility] and page to a number that you can be directly reached. If you still have difficulty reaching the provider, please page the [facility] (Director on Call) for the Hospitalists listed on amion for assistance.  09/04/2021, 4:11 PM

## 2021-09-04 NOTE — Progress Notes (Signed)
Peripherally Inserted Central Catheter Placement  The IV Nurse has discussed with the patient and/or persons authorized to consent for the patient, the purpose of this procedure and the potential benefits and risks involved with this procedure.  The benefits include less needle sticks, lab draws from the catheter, and the patient may be discharged home with the catheter. Risks include, but not limited to, infection, bleeding, blood clot (thrombus formation), and puncture of an artery; nerve damage and irregular heartbeat and possibility to perform a PICC exchange if needed/ordered by physician.  Alternatives to this procedure were also discussed.  Bard Power PICC patient education guide, fact sheet on infection prevention and patient information card has been provided to patient /or left at bedside.    PICC Placement Documentation  PICC Single Lumen 62/83/66 Right Basilic 38 cm 2 cm (Active)  Indication for Insertion or Continuance of Line Home intravenous therapies (PICC only) 09/04/21 2040  Exposed Catheter (cm) 2 cm 09/04/21 2040  Site Assessment Clean;Dry;Intact 09/04/21 2040  Line Status Saline locked;Flushed;Blood return noted 09/04/21 2040  Dressing Type Transparent;Securing device 09/04/21 2040  Dressing Status Clean;Dry;Intact 09/04/21 2040  Antimicrobial disc in place? Yes 09/04/21 2040  Safety Lock Not Applicable 29/47/65 4650  Line Care Connections checked and tightened 09/04/21 2040  Line Adjustment (NICU/IV Team Only) No 09/04/21 2040  Dressing Intervention New dressing 09/04/21 2040  Dressing Change Due 09/11/21 09/04/21 2040       Sonya Duffy 09/04/2021, 9:07 PM

## 2021-09-04 NOTE — Plan of Care (Signed)
  Problem: Education: Goal: Knowledge of General Education information will improve Description: Including pain rating scale, medication(s)/side effects and non-pharmacologic comfort measures Outcome: Progressing   Problem: Health Behavior/Discharge Planning: Goal: Ability to manage health-related needs will improve Outcome: Progressing   Problem: Education: Goal: Knowledge of General Education information will improve Description: Including pain rating scale, medication(s)/side effects and non-pharmacologic comfort measures Outcome: Progressing   

## 2021-09-04 NOTE — TOC Progression Note (Addendum)
Transition of Care [facility]) - Progression Note    Patient Details  Name: LOUIS GAW MRN: [id number] Date of Birth: 1946-03-01  Transition of [facility]) CM/SW Contact  Carles Collet, RN Phone Number: 09/04/2021, 9:05 AM  Clinical Narrative:   [facility]ified w [facility] that they are able to accept for [facility] P OT RN HHA, will assist with home IV Abx. Pam w [facility] has been updated as well.  Home infusion instruction has been started with the patients daughter by Jeannene Patella. TOC will continue to follow.   Spoke w patient's daughter [name]. She states that equipment will be delivered to the home tomorrow. She confirms that infusion training went well. Reviewed plan for DC tomorrow, she is able to provide transportation home later in the day.  Updated Pam w [facility] and Amy w [facility] of DC plan for tomorrow.     Expected Discharge Plan: [place] Barriers to Discharge: Continued Medical Work up  Expected Discharge Plan and Services Expected Discharge Plan: [place]   Discharge Planning Services: CM Consult   Living arrangements for the past 2 months: Single Family Home                 DME Arranged: 3-N-1, Hospital bed, Walker rolling   Date DME Agency Contacted: 08/31/21 Time DME Agency Contacted: [phone] Representative spoke with at DME Agency: Freda Munro HH Arranged: RN, PT, OT, Nurse's Aide [place] Agency: Kiana Date [place]: 09/04/21 Time [place]: [phone] Representative spoke with at [place]: Amy   Social Determinants of Health ([place]) Interventions    Readmission Risk Interventions No flowsheet data found.

## 2021-09-04 NOTE — Progress Notes (Addendum)
[facility] for Infectious Disease  Date of Admission:  08/27/2021     Total days of antibiotics 7         ASSESSMENT:  Ms. Bittel blood culture from 08/31/21 have remained without growth to date. No clear source of infection has been identified. Will plan treatment for simple bacteremia with 2 weeks of cefazolin. Will need PICC line placed for home therapy. OPAT / Home Health orders below. Arrange follow up in ID clinic.   PLAN:  Continue Cefazolin through 09/14/21 Home Health / OPAT orders below.  Arrange follow up in ID clinic.  Remaining supportive care per primary team.   Diagnosis: MSSA Bacteremia  Culture Result: MSSA  No Known Allergies  OPAT Orders Discharge antibiotics to be given via PICC line Discharge antibiotics: Per pharmacy protocol   Duration: 2 weeks  End Date: 09/14/21   [facility] Care Per Protocol:  Home health RN for IV administration and teaching; PICC line care and labs.    Labs weekly while on IV antibiotics: _X_ CBC with differential _X_ BMP __ CMP _X_ CRP _X_ ESR __ Vancomycin trough __ CK  _X_ Please pull PIC at completion of IV antibiotics __ Please leave PIC in place until doctor has seen patient or been notified  Fax weekly labs to [phone]  Clinic Follow Up Appt:  Terri Piedra, NP on 10/10 at 3pm  Principal Problem:   DKA (diabetic ketoacidosis) ([place]) Active Problems:   Cerebral thrombosis with cerebral infarction   Non-ST elevation (NSTEMI) myocardial infarction ([facility])   MSSA bacteremia   Acute vaginitis   Oral thrush    aspirin  325 mg Oral Daily   atorvastatin  40 mg Oral Daily   clopidogrel  75 mg Oral Daily   clotrimazole   Topical BID   conjugated estrogens  1 Applicatorful Vaginal QHS   enoxaparin (LOVENOX) injection  40 mg Subcutaneous QHS   fluconazole  100 mg Oral Daily   insulin aspart  0-15 Units Subcutaneous TID AC & HS   insulin aspart  5 Units Subcutaneous TID WC   [START ON 09/05/2021]  insulin glargine-yfgn  25 Units Subcutaneous Daily   metoprolol tartrate  25 mg Oral BID   nystatin  5 mL Oral QID   pantoprazole  40 mg Oral Daily   vitamin B-12  1,000 mcg Oral Daily    SUBJECTIVE:  Afebrile overnight with no acute events. Feeling okay.   No Known Allergies   Review of Systems: Review of Systems  Constitutional:  Negative for chills, fever and weight loss.  Respiratory:  Negative for cough, shortness of breath and wheezing.   Cardiovascular:  Negative for chest pain and leg swelling.  Gastrointestinal:  Negative for abdominal pain, constipation, diarrhea, nausea and vomiting.  Skin:  Negative for rash.     OBJECTIVE: Vitals:   09/03/21 2334 09/04/21 0400 09/04/21 0746 09/04/21 1201  BP: (!) 142/59 (!) 160/61 (!) 160/61 (!) 130/57  Pulse: 68 67 86 62  Resp: _0 Temp: 98.1 F (36.7 C) 98 F (36.7 C) 98.6 F (37 C) (!) 97.4 F (36.3 C)  TempSrc: Oral Oral Oral Oral  SpO2: 100% 95% 97% 98%  Weight:      Height:       Body mass index is 28.79 kg/m.  Physical Exam Constitutional:      General: She is not in acute distress.    Appearance: She is well-developed.  Cardiovascular:     Rate  and Rhythm: Normal rate and regular rhythm.     Heart sounds: Normal heart sounds.  Pulmonary:     Effort: Pulmonary effort is normal.     Breath sounds: Normal breath sounds.  Skin:    General: Skin is warm and dry.  Neurological:     Mental Status: She is alert and oriented to person, place, and time.  Psychiatric:        Behavior: Behavior normal.        Thought Content: Thought content normal.        Judgment: Judgment normal.    Lab Results Lab Results  Component Value Date   WBC 16.4 (H) 09/04/2021   HGB 12.2 09/04/2021   HCT 38.4 09/04/2021   MCV 87.1 09/04/2021   PLT 380 09/04/2021    Lab Results  Component Value Date   CREATININE 1.15 (H) 09/04/2021   BUN 15 09/04/2021   NA 135 09/04/2021   K 4.2 09/04/2021   CL 100 09/04/2021    CO2 24 09/04/2021    Lab Results  Component Value Date   ALT <5 09/03/2021   AST 20 09/03/2021   ALKPHOS 476 (H) 09/03/2021   BILITOT 0.4 09/03/2021     Microbiology: Recent Results (from the past 240 hour(s))  Resp Panel by RT-PCR (Flu A&B, Covid) Nasopharyngeal Swab     Status: None   Collection Time: 08/27/21  3:43 PM   Specimen: Nasopharyngeal Swab; Nasopharyngeal(NP) swabs in vial transport medium  Result Value Ref Range Status   SARS Coronavirus 2 by RT PCR NEGATIVE NEGATIVE Final    Comment: (NOTE) SARS-CoV-2 target nucleic acids are NOT DETECTED.  The SARS-CoV-2 RNA is generally detectable in upper respiratory specimens during the acute phase of infection. The lowest concentration of SARS-CoV-2 viral copies this assay can detect is 138 copies/mL. A negative result does not preclude SARS-Cov-2 infection and should not be used as the sole basis for treatment or other patient management decisions. A negative result may occur with  improper specimen collection/handling, submission of specimen other than nasopharyngeal swab, presence of viral mutation(s) within the areas targeted by this assay, and inadequate number of viral copies(<138 copies/mL). A negative result must be combined with clinical observations, patient history, and epidemiological information. The expected result is Negative.  Fact Sheet for Patients:  EntrepreneurPulse.com.au  Fact Sheet for Healthcare Providers:  IncredibleEmployment.be  This test is no t yet approved or cleared by the [place] FDA and  has been authorized for detection and/or diagnosis of SARS-CoV-2 by FDA under an Emergency Use Authorization (EUA). This EUA will remain  in effect (meaning this test can be used) for the duration of the COVID-19 declaration under Section 564(b)(1) of the Act, 21 U.S.C.section 360bbb-3(b)(1), unless the authorization is terminated  or revoked sooner.        Influenza A by PCR NEGATIVE NEGATIVE Final   Influenza B by PCR NEGATIVE NEGATIVE Final    Comment: (NOTE) The Xpert Xpress SARS-CoV-2/FLU/RSV plus assay is intended as an aid in the diagnosis of influenza from Nasopharyngeal swab specimens and should not be used as a sole basis for treatment. Nasal washings and aspirates are unacceptable for Xpert Xpress SARS-CoV-2/FLU/RSV testing.  Fact Sheet for Patients: EntrepreneurPulse.com.au  Fact Sheet for Healthcare Providers: IncredibleEmployment.be  This test is not yet approved or cleared by the [place] FDA and has been authorized for detection and/or diagnosis of SARS-CoV-2 by FDA under an Emergency Use Authorization (EUA). This EUA will remain in effect (  meaning this test can be used) for the duration of the COVID-19 declaration under Section 564(b)(1) of the Act, 21 U.S.C. section 360bbb-3(b)(1), unless the authorization is terminated or revoked.  Performed at [facility], [place] [address], [place], [place] [address]   Culture, blood (Routine X 2) w Reflex to ID Panel     Status: None   Collection Time: 08/28/21 11:41 PM   Specimen: BLOOD  Result Value Ref Range Status   Specimen Description BLOOD LEFT ANTECUBITAL  Final   Special Requests AEROBIC BOTTLE ONLY Blood Culture adequate volume  Final   Culture   Final    NO GROWTH 5 DAYS Performed at [facility] [address] [place], [place] [address]    Report Status 09/03/2021 FINAL  Final  Culture, blood (Routine X 2) w Reflex to ID Panel     Status: Abnormal   Collection Time: 08/28/21 11:42 PM   Specimen: BLOOD LEFT FOREARM  Result Value Ref Range Status   Specimen Description BLOOD LEFT FOREARM  Final   Special Requests   Final    BOTTLES DRAWN AEROBIC AND ANAEROBIC Blood Culture adequate volume   Culture  Setup Time   Final    GRAM POSITIVE COCCI IN CLUSTERS IN BOTH AEROBIC AND ANAEROBIC BOTTLES CRITICAL RESULT  CALLED TO, READ BACK BY AND VERIFIED WITH: Burnett Kanaris [id number] AT 1831 BY CM Performed at [facility], [place] [address] [place], [place] [address]    Culture STAPHYLOCOCCUS AUREUS (A)  Final   Report Status 08/31/2021 FINAL  Final   Organism ID, Bacteria STAPHYLOCOCCUS AUREUS  Final      Susceptibility   Staphylococcus aureus - MIC*    CIPROFLOXACIN <=0.5 SENSITIVE Sensitive     ERYTHROMYCIN >=8 RESISTANT Resistant     GENTAMICIN <=0.5 SENSITIVE Sensitive     OXACILLIN <=0.25 SENSITIVE Sensitive     TETRACYCLINE <=1 SENSITIVE Sensitive     VANCOMYCIN 1 SENSITIVE Sensitive     TRIMETH/SULFA <=10 SENSITIVE Sensitive     CLINDAMYCIN <=0.25 SENSITIVE Sensitive     RIFAMPIN <=0.5 SENSITIVE Sensitive     Inducible Clindamycin NEGATIVE Sensitive     * STAPHYLOCOCCUS AUREUS  Blood Culture ID Panel (Reflexed)     Status: Abnormal   Collection Time: 08/28/21 11:42 PM  Result Value Ref Range Status   Enterococcus faecalis NOT DETECTED NOT DETECTED Final   Enterococcus Faecium NOT DETECTED NOT DETECTED Final   Listeria monocytogenes NOT DETECTED NOT DETECTED Final   Staphylococcus species DETECTED (A) NOT DETECTED Final    Comment: CRITICAL RESULT CALLED TO, READ BACK BY AND VERIFIED WITH: PHARMD G BARR [id number] AT 1830 BY CM    Staphylococcus aureus (BCID) DETECTED (A) NOT DETECTED Final    Comment: CRITICAL RESULT CALLED TO, READ BACK BY AND VERIFIED WITH: PHARMD G BARR [id number] AT 1830 BY CM    Staphylococcus epidermidis NOT DETECTED NOT DETECTED Final   Staphylococcus lugdunensis NOT DETECTED NOT DETECTED Final   Streptococcus species NOT DETECTED NOT DETECTED Final   Streptococcus agalactiae NOT DETECTED NOT DETECTED Final   Streptococcus pneumoniae NOT DETECTED NOT DETECTED Final   Streptococcus pyogenes NOT DETECTED NOT DETECTED Final   A.calcoaceticus-baumannii NOT DETECTED NOT DETECTED Final   Bacteroides fragilis NOT DETECTED NOT DETECTED Final   Enterobacterales NOT  DETECTED NOT DETECTED Final   Enterobacter cloacae complex NOT DETECTED NOT DETECTED Final   Escherichia coli NOT DETECTED NOT DETECTED Final   Klebsiella aerogenes NOT  DETECTED NOT DETECTED Final   Klebsiella oxytoca NOT DETECTED NOT DETECTED Final   Klebsiella pneumoniae NOT DETECTED NOT DETECTED Final   Proteus species NOT DETECTED NOT DETECTED Final   Salmonella species NOT DETECTED NOT DETECTED Final   Serratia marcescens NOT DETECTED NOT DETECTED Final   Haemophilus influenzae NOT DETECTED NOT DETECTED Final   Neisseria meningitidis NOT DETECTED NOT DETECTED Final   Pseudomonas aeruginosa NOT DETECTED NOT DETECTED Final   Stenotrophomonas maltophilia NOT DETECTED NOT DETECTED Final   Candida albicans NOT DETECTED NOT DETECTED Final   Candida auris NOT DETECTED NOT DETECTED Final   Candida glabrata NOT DETECTED NOT DETECTED Final   Candida krusei NOT DETECTED NOT DETECTED Final   Candida parapsilosis NOT DETECTED NOT DETECTED Final   Candida tropicalis NOT DETECTED NOT DETECTED Final   Cryptococcus neoformans/gattii NOT DETECTED NOT DETECTED Final   Meth resistant mecA/C and MREJ NOT DETECTED NOT DETECTED Final    Comment: Performed at [facility], [place] [address], [place], [place] [address]  Culture, blood (routine x 2)     Status: None (Preliminary result)   Collection Time: 08/31/21  7:21 AM   Specimen: BLOOD  Result Value Ref Range Status   Specimen Description BLOOD LEFT ANTECUBITAL  Final   Special Requests   Final    BOTTLES DRAWN AEROBIC ONLY Blood Culture results may not be optimal due to an inadequate volume of blood received in culture bottles   Culture   Final    NO GROWTH 4 DAYS Performed at [facility], [place] [address] [place], [place] [address]    Report Status PENDING  Incomplete  Culture, blood (routine x 2)     Status: None (Preliminary result)   Collection Time: 08/31/21  7:25 AM   Specimen: BLOOD LEFT HAND  Result Value Ref Range Status    Specimen Description BLOOD LEFT HAND  Final   Special Requests   Final    BOTTLES DRAWN AEROBIC ONLY Blood Culture results may not be optimal due to an inadequate volume of blood received in culture bottles   Culture   Final    NO GROWTH 4 DAYS Performed at [facility], [place] [address] [place], [place] [address]    Report Status PENDING  Incomplete     Terri Piedra, NP [facility]  09/04/2021  2:50 PM   Patient was seen, examined,treatment plan was discussed with the  Advance Practice Provider.  I have personally reviewed the clinical findings, labs, imaging studies and management of this patient in detail.  I agree with the documentation, as recorded by the Advance Practice Provider.

## 2021-09-04 NOTE — Progress Notes (Signed)
Physical Therapy Treatment Patient Details Name: Sonya Duffy MRN: [id number] DOB: 12-21-1945 Today's Date: 09/04/2021   History of Present Illness Sonya Duffy is an 75 y.o. female admitted on 08/27/21 and patient was found to be in DKA. Also with metabolic encephalopathy, NSTEMI and AKI. 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 Pt with hx of DM and HTN.    PT Comments    Patient eager to participate. Requires max cuing throughout session for safe, proper use of RW and min assist for balance. Repeated sit to stand for strengthening and practice sequencing with RW.    Recommendations for follow up therapy are one component of a multi-disciplinary discharge planning process, led by the attending physician.  Recommendations may be updated based on patient status, additional functional criteria and insurance authorization.  Follow Up Recommendations  Home health PT;Supervision/Assistance - 24 hour     Equipment Recommendations  Rolling walker with 5" wheels;3in1 (PT)    Recommendations for Other Services       Precautions / Restrictions Precautions Precautions: Fall     Mobility  Bed Mobility Overal bed mobility: Needs Assistance Bed Mobility: Supine to Sit;Sit to Supine     Supine to sit: Supervision Sit to supine: Supervision   General bed mobility comments: for safety due to impulsive    Transfers Overall transfer level: Needs assistance Equipment used: Rolling walker (2 wheeled) Transfers: Sit to/from Stand Sit to Stand: Min assist         General transfer comment: verbal cues and assist for hand position and placement of RW  Ambulation/Gait Ambulation/Gait assistance: Min assist Gait Distance (Feet): 80 Feet Assistive device: Rolling walker (2 wheeled) Gait Pattern/deviations: Step-through pattern;Decreased stride length;Trunk flexed Gait velocity: decreased   General Gait Details: Poor management of RW, tendency to keep walker too  far forward and not keeping feet on inside of walker with turns. Requiring minA for balance; poor understanding and execution of turning RW sideways to go through narrow space by [facility]   Stairs             Wheelchair Mobility    Modified Rankin (Stroke Patients Only) Modified Rankin (Stroke Patients Only) Pre-Morbid Rankin Score: No significant disability Modified Rankin: Moderately severe disability     Balance Overall balance assessment: Needs assistance Sitting-balance support: No upper extremity supported;Feet supported Sitting balance-Leahy Scale: Fair     Standing balance support: Single extremity supported;During functional activity Standing balance-Leahy Scale: Poor Standing balance comment: one mild LOB while standing to groom at the sink; pt was able to self correct                            Cognition Arousal/Alertness: Awake/alert Behavior During Therapy: Impulsive Overall Cognitive Status: Impaired/Different from baseline Area of Impairment: Attention;Following commands;Safety/judgement;Awareness;Problem solving                   Current Attention Level: Sustained Memory: Decreased short-term memory Following Commands: Follows one step commands inconsistently Safety/Judgement: Decreased awareness of safety;Decreased awareness of deficits Awareness: Intellectual Problem Solving: Slow processing;Decreased initiation;Difficulty sequencing;Requires verbal cues;Requires tactile cues General Comments: Poor management of RW required multimodial cueing. Continued to take one hand off RW and reach to hold onto counter, bed, etc.  Limited problem solving and insight into deficits.      Exercises Other Exercises Other Exercises: x5 Sit to stands    General Comments        Pertinent  Vitals/Pain Pain Assessment: No/denies pain Faces Pain Scale: No hurt    Home Living                      Prior Function            PT Goals  (current goals can now be found in the care plan section) Acute Rehab PT Goals Patient Stated Goal: go home with daughter PT Goal Formulation: With patient Time For Goal Achievement: 09/13/21 Potential to Achieve Goals: Good Progress towards PT goals: Progressing toward goals    Frequency    Min 3X/week      PT Plan Current plan remains appropriate    Co-evaluation              AM-PAC PT "6 Clicks" Mobility   Outcome Measure  Help needed turning from your back to your side while in a flat bed without using bedrails?: A Little Help needed moving from lying on your back to sitting on the side of a flat bed without using bedrails?: A Little Help needed moving to and from a bed to a chair (including a wheelchair)?: A Little Help needed standing up from a chair using your arms (e.g., wheelchair or bedside chair)?: A Little Help needed to walk in hospital room?: A Little Help needed climbing 3-5 steps with a railing? : A Lot 6 Click Score: 17    End of Session Equipment Utilized During Treatment: Gait belt Activity Tolerance: Patient tolerated treatment well Patient left: with call bell/phone within reach;in bed;with bed alarm set Nurse Communication: Mobility status PT Visit Diagnosis: Unsteadiness on feet (R26.81);Muscle weakness (generalized) (M62.81)     Time: 1543-1600 PT Time Calculation (min) (ACUTE ONLY): 17 min  Charges:  $Gait Training: 8-22 mins                      Arby Barrette, PT Pager [phone]    Rexanne Mano 09/04/2021, 4:16 PM

## 2021-09-04 NOTE — Progress Notes (Signed)
PHARMACY CONSULT NOTE FOR:  OUTPATIENT  PARENTERAL ANTIBIOTIC THERAPY (OPAT)  Indication: MSSA Bacteremia Regimen: Cefazolin 2g IV q8h End date: 09/14/21  IV antibiotic discharge orders are pended. To discharging provider:  please sign these orders via discharge navigator,  Select New Orders & click on the button choice - Manage This Unsigned Work.     Thank you for allowing pharmacy to be a part of this patient's care.  Lestine Box, PharmD PGY2 Infectious Diseases Pharmacy Resident   Please check [email or web address] for unit-specific pharmacy phone numbers

## 2021-09-05 DIAGNOSIS — N760 Acute vaginitis: Secondary | ICD-10-CM | POA: Diagnosis not present

## 2021-09-05 DIAGNOSIS — R7881 Bacteremia: Secondary | ICD-10-CM | POA: Diagnosis not present

## 2021-09-05 DIAGNOSIS — E0810 Diabetes mellitus due to underlying condition with ketoacidosis without coma: Secondary | ICD-10-CM | POA: Diagnosis not present

## 2021-09-05 DIAGNOSIS — I6330 Cerebral infarction due to thrombosis of unspecified cerebral artery: Secondary | ICD-10-CM | POA: Diagnosis not present

## 2021-09-05 LAB — CULTURE, BLOOD (ROUTINE X 2)
Culture: NO GROWTH
Culture: NO GROWTH

## 2021-09-05 LAB — GLUCOSE, CAPILLARY
Glucose-Capillary: 137 mg/dL — ABNORMAL HIGH (ref 70–99)
Glucose-Capillary: 193 mg/dL — ABNORMAL HIGH (ref 70–99)
Glucose-Capillary: 254 mg/dL — ABNORMAL HIGH (ref 70–99)
Glucose-Capillary: 175 mg/dL — ABNORMAL HIGH (ref 70–99)

## 2021-09-05 LAB — VITAMIN B1: Vitamin B1 (Thiamine): 85.4 nmol/L (ref 66.5–200.0)

## 2021-09-05 MED ORDER — METOPROLOL TARTRATE 25 MG PO TABS: 25.0000 mg | ORAL_TABLET | Freq: Two times a day (BID) | ORAL | 2 refills | Status: DC

## 2021-09-05 MED ORDER — CYANOCOBALAMIN 1000 MCG PO TABS: 1000.0000 ug | ORAL_TABLET | Freq: Every day | ORAL | 3 refills | Status: DC

## 2021-09-05 MED ORDER — CLOTRIMAZOLE 1 % EX CREA: TOPICAL_CREAM | Freq: Two times a day (BID) | CUTANEOUS | 0 refills | Status: DC

## 2021-09-05 MED ORDER — NYSTATIN 100000 UNIT/ML MT SUSP: 5.0000 mL | Freq: Four times a day (QID) | OROMUCOSAL | 0 refills | Status: AC

## 2021-09-05 MED ORDER — PANTOPRAZOLE SODIUM 40 MG PO TBEC: 40.0000 mg | DELAYED_RELEASE_TABLET | Freq: Every day | ORAL | 1 refills | Status: AC

## 2021-09-05 MED ORDER — INSULIN GLARGINE 100 UNIT/ML ~~LOC~~ SOLN
25.0000 [IU] | Freq: Every day | SUBCUTANEOUS | 11 refills | Status: DC
Start: 2021-09-05 — End: 2021-10-19

## 2021-09-05 MED ORDER — ESTROGENS CONJUGATED 0.625 MG/GM VA CREA: TOPICAL_CREAM | Freq: Every day | VAGINAL | 12 refills | Status: DC

## 2021-09-05 MED ORDER — ASPIRIN EC 81 MG PO TBEC: 81.0000 mg | DELAYED_RELEASE_TABLET | Freq: Every day | ORAL | 2 refills | Status: DC

## 2021-09-05 MED ORDER — CLOPIDOGREL BISULFATE 75 MG PO TABS: 75.0000 mg | ORAL_TABLET | Freq: Every day | ORAL | 0 refills | Status: AC

## 2021-09-05 MED ORDER — FLUCONAZOLE 100 MG PO TABS: 100.0000 mg | ORAL_TABLET | Freq: Every day | ORAL | 0 refills | Status: AC

## 2021-09-05 MED ORDER — CEFAZOLIN IV (FOR PTA / DISCHARGE USE ONLY): 2.0000 g | Freq: Three times a day (TID) | INTRAVENOUS | 0 refills | Status: AC

## 2021-09-05 MED ORDER — ATORVASTATIN CALCIUM 40 MG PO TABS: 40.0000 mg | ORAL_TABLET | Freq: Every day | ORAL | 11 refills | Status: AC

## 2021-09-05 NOTE — Discharge Summary (Signed)
Physician Discharge Summary  Sonya Duffy:[id number] DOB: 1946/01/01 DOA: 08/27/2021  PCP: Merryl Hacker, No  Admit date: 08/27/2021 Discharge date: 09/05/2021  Admitted From: Home  Discharge disposition: Home health  Recommendations for Outpatient Follow-Up:   Follow up with your primary care provider in one week.  Check CBC, BMP, magnesium in the next visit Patient will be continued on dual antiplatelets.  Will need outpatient follow-up for possible nuclear stress test with cardiology.  Patient will follow-up with cardiology in 4 weeks. Patient will follow-up with neurology as outpatient in 1 week.  Discharge Diagnosis:   Principal Problem:   DKA (diabetic ketoacidosis) ([place]) Active Problems:   Cerebral thrombosis with cerebral infarction   Non-ST elevation (NSTEMI) myocardial infarction ([place])   MSSA bacteremia   Acute vaginitis   Oral thrush   Discharge Condition: Improved.  Diet recommendation: Low sodium, heart healthy.  Carbohydrate-modified.   Wound care: None.  Code status: Full.   History of Present Illness:   Sonya Duffy is an 75 y.o. female with past medical history of diabetes mellitus, hypertension presented to the hospital with DKA.  CT head scan done showed  chronic appearing right cerebellar infarct, area of extra axial increase attenuation within the right parietal region which may represent partially calcified meningioma.  Neurology was consulted.  MRI brain showed subacute infarct of left paracentral pons, associated microhemorrhage but no malignant hemorrhagic transformation or mass-effect.  Blood cultures were positive for MSSA.  Infectious disease was consulted and patient will be continued on IV cefazolin until 09/14/2021.  Patient also had oral thrush and vaginitis.  Hospital Course:   Following conditions were addressed during hospitalization as listed below, Acute Stroke MRI of the brain showed a left pontine infarct.  Neurology was consulted.   2D echocardiogram showed LV ejection fraction of 50 to 55%.  Hemoglobin A1c of 13.3.  Neurology recommended aspirin and Plavix for 3 weeks then continue aspirin alone.     NSTEMI Seen at significant troponin elevation.  Cardiology was consulted.  Echocardiogram showed some apical akinesis.  Recommended DAPT, statins, beta-blocker on discharge.  Recommended outpatient nuclear stress test.  MSSA bacteremia.  She does have a PICC line in place.  Blood culture was positive for MSSA.  Patient did have some back pain but had resolved so carditis was not suspected.  Repeat blood cultures were negative.  Since the patient had no clear source, patient will continue cefazolin until 09/14/2021.  Diabetic ketoacidosis Received insulin drip and IV fluids during hospitalization.  Hemoglobin A1c was 13.3.  Continue Lantus sliding scale insulin on discharge.  Oral thrush/vaginitis Continue Diflucan and nystatin to complete course.  Hypokalemia/hypomagnesemia Replenished and improved.  Acute metabolic encephalopathy Likely multifactorial, likely from diabetic ketoacidosis, stroke, NSTEMI.  Currently at baseline.   Partially calcified meningioma Noted on CT and MRI of the brain.  No intervention needed.     Acute kidney injury Resolved.  Creatinine of 1.1 prior to discharge..  Creatinine was 2.3 on admission.  Likely secondary to poor oral intake.    B12 deficiency Recommend B12 as noted below.  Will be continued on vitamin B12 on discharge   Disposition.  At this time, patient is stable for disposition home with home health.  Medical Consultants:   Neurology Cardiology ID  Procedures:    PICC line placement Subjective:   Today, patient was seen and examined at bedside.  Denies any nausea, vomiting fever chills or rigor.  Discharge Exam:   Vitals:   09/05/21  0324 09/05/21 0743  BP: (!) 123/48 (!) 130/59  Pulse: 81 83  Resp: 18 17  Temp: 98.6 F (37 C) 98.2 F (36.8 C)  SpO2: 97%  94%   Vitals:   09/04/21 2259 09/05/21 0021 09/05/21 0324 09/05/21 0743  BP: (!) 85/44 (!) 133/112 (!) 123/48 (!) 130/59  Pulse: 87  81 83  Resp: _0 Temp: 98.3 F (36.8 C)  98.6 F (37 C) 98.2 F (36.8 C)  TempSrc: Oral  Axillary Oral  SpO2: 97%  97% 94%  Weight:      Height:       General: Alert awake, not in obvious distress, overweight HENT: pupils equally reacting to light,  No scleral pallor or icterus noted. Oral mucosa is moist.  Chest:  Clear breath sounds.  Diminished breath sounds bilaterally. No crackles or wheezes.  CVS: S1 &S2 heard. No murmur.  Regular rate and rhythm. Abdomen: Soft, nontender, nondistended.  Bowel sounds are heard.   Extremities: No cyanosis, clubbing or edema.  Peripheral pulses are palpable. Psych: Alert, awake and oriented, normal mood CNS:  No cranial nerve deficits.  Power equal in all extremities.   Skin: Warm and dry.  No rashes noted.  The results of significant diagnostics from this hospitalization (including imaging, microbiology, ancillary and laboratory) are listed below for reference.     Diagnostic Studies:   CT ANGIO HEAD NECK W WO CM  Result Date: 08/28/2021 CLINICAL DATA:  Stroke, follow up EXAM: CT ANGIOGRAPHY HEAD AND NECK TECHNIQUE: Multidetector CT imaging of the head and neck was performed using the standard protocol during bolus administration of intravenous contrast. Multiplanar CT image reconstructions and MIPs were obtained to evaluate the vascular anatomy. Carotid stenosis measurements (when applicable) are obtained utilizing NASCET criteria, using the distal internal carotid diameter as the denominator. CONTRAST:  27m OMNIPAQUE IOHEXOL 350 MG/ML SOLN COMPARISON:  Same day MRI.  CT head 08/27/2021. FINDINGS: CT HEAD FINDINGS Brain: Left paramidline pontine acute/subacute infarct, better characterized on recent MRI. No evidence of acute hemorrhage. Mild right cerebellar infarct. Approximately 15 mm extra-axial,  dural-based lesion along the right cerebral convexity, compatible with meningioma that was better characterized on recent MRI. Similar size of the ventricular system. No midline shift. No extra-axial fluid collection. Vascular: See below. Skull: No acute fracture. Sinuses: Clear sinuses. Chronic deformity of the nasal bones. No acute orbital findings. Orbits: No mastoid effusions. Review of the MIP images confirms the above findings CTA NECK FINDINGS Aortic arch: Great vessel origins are patent. Right carotid system: Atherosclerosis at the carotid bifurcation without greater than 50% stenosis relative to the distal vessel. The ICA is also mildly narrowed as it courses along the posterior aspect of the hyoid bone in the upper neck with retropharyngeal course. Left carotid system: Atherosclerosis of the carotid bifurcation without greater than 50% stenosis relative to the distal vessel. Vertebral arteries: Right dominant. Bilateral vertebral arteries are patent without significant pancreatic 50%) stenosis. Skeleton: Multilevel degenerative change. Other neck: No acute findings. Upper chest: Biapical pleural-parenchymal scarring. Otherwise, visualized lung apices are clear. 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 Mild narrowing of the left intracranial ICA. Bilateral M1 and proximal M2 MCA branches are patent. Moderate stenosis of the distal right M1 MCA with otherwise mild multifocal stenosis of the MCAs bilaterally. Early left MCA bifurcation. Bilateral ICAs are patent with small right A1 ACA, likely  in part congenital. Posterior circulation: Small/non dominant intradural vertebral artery with superimposed severe stenosis distally. Dominant right vertebral artery is patent without significant stenosis. Moderate (approximately 50%) stenosis of the mid to distal basilar artery, which remains patent.  PCAs are small bilaterally with multifocal severe stenosis of the left P2 PCA. Mild multifocal stenosis of the right PCA. Venous sinuses: As permitted by contrast timing, patent. Anatomic variants: See above. Review of the MIP images confirms the above findings IMPRESSION: CT head: 1. Left paramidline acute/subacute pontine infarct better characterized on same-day MRI. 2. Remote right cerebellar infarct. 3. Redemonstrated 15 mm right cerebral convexity meningioma without significant mass effect. CTA head: 1. 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 2. Moderate (approximately 50%) stenosis of the mid to distal basilar artery and severe stenosis of the non-dominant left intradural vertebral artery distally. 3. Small bilateral PCAs with multifocal severe stenosis of the left P2 PCA. CTA neck: Bilateral carotid bifurcation atherosclerosis without greater than 50% stenosis relative to the distal vessels. Electronically Signed   By: Margaretha Sheffield M.D.   On: 08/28/2021 09:59   CT Head Wo Contrast  Result Date: 08/27/2021 CLINICAL DATA:  Altered mental status. EXAM: CT HEAD WITHOUT CONTRAST TECHNIQUE: Contiguous axial images were obtained from the base of the skull through the vertex without intravenous contrast. COMPARISON:  None. FINDINGS: Brain: There is mild cerebral atrophy with widening of the extra-axial spaces and ventricular dilatation. There are areas of decreased attenuation within the white matter tracts of the supratentorial brain, consistent with microvascular disease changes. A 2.2 cm x 1.6 cm well-defined, chronic appearing area of low attenuation is seen within the cerebellum on the right. There is no evidence of associated mass effect or midline shift. A 1.2 cm x 0.7 cm x 1.2 cm partially calcified area of extra-axial increased attenuation is seen within the right parietal region (axial CT image 17, CT series 2).  Vascular: No hyperdense vessels are identified. Skull: Normal. Negative for fracture or focal lesion. Sinuses/Orbits: No acute finding. Other: The study is limited secondary to patient motion. IMPRESSION: 1. Area of extra-axial increased attenuation within the right parietal region which may represent a partially calcified meningioma. MRI correlation is recommended. 2. Chronic appearing right cerebellar infarct. 3. No acute intracranial abnormality. Electronically Signed   By: Virgina Norfolk M.D.   On: 08/27/2021 15:28   MR BRAIN W WO CONTRAST  Result Date: 08/28/2021 CLINICAL DATA:  75 year old female with delirium and hypertension. Weakness. Recent depression, stopped taking medications including insulin. EXAM: MRI HEAD WITHOUT AND WITH CONTRAST TECHNIQUE: Multiplanar, multiecho pulse sequences of the brain and surrounding structures were obtained without and with intravenous contrast. CONTRAST:  46m GADAVIST GADOBUTROL 1 MMOL/ML IV SOLN COMPARISON:  Head CT 08/27/2021. FINDINGS: Study is intermittently degraded by motion artifact despite repeated imaging attempts, and no axial T2 weighted imaging could be obtained. Brain: Linear and rounded roughly 14 mm area of abnormal diffusion in the left paracentral pons (series 16, image 73 and series 18, image 58) is mildly restricted on ADC and associated with FLAIR hyperintensity. There is a microhemorrhage there in the left pons (series 22, image 17) which could be acute or chronic. No significant mass effect. There is patchy post ischemic enhancement on series 29, image 22. No other abnormal diffusion. Patchy chronic right superior cerebellar infarct with encephalomalacia. Patchy bilateral cerebral white matter FLAIR hyperintensity. Negative for age deep gray matter nuclei. Lateral  and 3rd ventriculomegaly with diminutive cerebral aqueduct and 4th ventricle. No transependymal edema suspected, in the temporal horns remain fairly diminutive. Smooth oval  homogeneously enhancing extra-axial mass along the posterior right temporal lobe measures 15 mm diameter, 7 mm in thickness, and there is regional associated dural thickening. No significant mass effect. No regional cerebral edema. No superimposed midline shift. Cervicomedullary junction and pituitary are within normal limits. No other abnormal intracranial enhancement or dural thickening identified. Vascular: Major intracranial vascular flow voids appear preserved on the lone coronal T2 weighted imaging. The major dural venous sinuses are enhancing and appear to be patent. Skull and upper cervical spine: Negative visible cervical spine. Visualized bone marrow signal is within normal limits. Sinuses/Orbits: Negative orbits. Paranasal sinuses and mastoids are stable and well aerated. Other: Grossly normal visible internal auditory structures. IMPRESSION: 1. Subacute infarct of the left paracentral pons. Associated microhemorrhage but no malignant hemorrhagic transformation or mass effect. 2. Underlying chronic right SCA territory infarct. 3. Small 15 mm meningioma along the right convexity with no mass effect or associated edema. 4. 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 But no acute ventriculomegaly or transependymal edema suspected. Electronically Signed   By: Genevie Ann M.D.   On: 08/28/2021 07:43   DG CHEST PORT 1 VIEW  Result Date: 08/28/2021 CLINICAL DATA:  Fever. EXAM: PORTABLE CHEST 1 VIEW COMPARISON:  Chest x-ray 07/03/2012. FINDINGS: The left costophrenic angle has been excluded from the examination. There is no focal lung consolidation, pleural effusion or pneumothorax. The cardiomediastinal silhouette is within normal limits. No acute fractures are seen. There is a right shoulder arthroplasty. IMPRESSION: No active disease. Electronically Signed   By: Ronney Asters M.D.   On: 08/28/2021 20:26     Labs:   Basic Metabolic Panel: Recent Labs  Lab 08/30/21 0448  08/31/21 0721 09/01/21 0127 09/01/21 1721 09/02/21 0030 09/03/21 1030 09/04/21 0348  NA 141  --  138 138 140 135 135  K 3.0*  --  2.7* 4.9 4.8 4.2 4.2  CL 107  --  103 106 107 101 100  CO2 23  --  25 19* _0 GLUCOSE 141*  --  89 133* 118* 232* 94  BUN 14  --  _1 CREATININE 1.39*  --  1.25* 1.14* 1.14* 1.26* 1.15*  CALCIUM 9.7  --  9.0 8.9 8.9 8.4* 9.3  MG 1.1* 1.4* 2.1  --  1.8  --   --    GFR Estimated Creatinine Clearance: 37.9 mL/min (A) (by C-G formula based on SCr of 1.15 mg/dL (H)). Liver Function Tests: Recent Labs  Lab 09/03/21 1030  AST 20  ALT <5  ALKPHOS 476*  BILITOT 0.4  PROT 5.9*  ALBUMIN 1.7*   No results for input(s): LIPASE, AMYLASE in the last 168 hours. No results for input(s): AMMONIA in the last 168 hours. Coagulation profile No results for input(s): INR, PROTIME in the last 168 hours.  CBC: Recent Labs  Lab 09/01/21 0127 09/02/21 0030 09/03/21 1030 09/04/21 0348  WBC 15.9* 18.1* 15.9* 16.4*  HGB 11.1* 10.8* 9.3* 12.2  HCT 33.3* 33.1* 29.1* 38.4  MCV 84.5 85.8 86.9 87.1  PLT 338 339 292 380   Cardiac Enzymes: No results for input(s): CKTOTAL, CKMB, CKMBINDEX, TROPONINI in the last 168 hours. BNP: Invalid input(s): POCBNP CBG: Recent Labs  Lab 09/04/21 1128 09/04/21 1606 09/04/21 2155 09/05/21 0630 09/05/21 0805  GLUCAP 262* 111* 265* 137* 193*  D-Dimer No results for input(s): DDIMER in the last 72 hours. Hgb A1c No results for input(s): HGBA1C in the last 72 hours. Lipid Profile No results for input(s): CHOL, HDL, LDLCALC, TRIG, CHOLHDL, LDLDIRECT in the last 72 hours. Thyroid function studies No results for input(s): TSH, T4TOTAL, T3FREE, THYROIDAB in the last 72 hours.  Invalid input(s): FREET3 Anemia work up No results for input(s): VITAMINB12, FOLATE, FERRITIN, TIBC, IRON, RETICCTPCT in the last 72 hours. Microbiology Recent Results (from the past 240 hour(s))  Resp Panel by RT-PCR (Flu A&B,  Covid) Nasopharyngeal Swab     Status: None   Collection Time: 08/27/21  3:43 PM   Specimen: Nasopharyngeal Swab; Nasopharyngeal(NP) swabs in vial transport medium  Result Value Ref Range Status   SARS Coronavirus 2 by RT PCR NEGATIVE NEGATIVE Final    Comment: (NOTE) SARS-CoV-2 target nucleic acids are NOT DETECTED.  The SARS-CoV-2 RNA is generally detectable in upper respiratory specimens during the acute phase of infection. The lowest concentration of SARS-CoV-2 viral copies this assay can detect is 138 copies/mL. A negative result does not preclude SARS-Cov-2 infection and should not be used as the sole basis for treatment or other patient management decisions. A negative result may occur with  improper specimen collection/handling, submission of specimen other than nasopharyngeal swab, presence of viral mutation(s) within the areas targeted by this assay, and inadequate number of viral copies(<138 copies/mL). A negative result must be combined with clinical observations, patient history, and epidemiological information. The expected result is Negative.  Fact Sheet for Patients:  EntrepreneurPulse.com.au  Fact Sheet for Healthcare Providers:  IncredibleEmployment.be  This test is no t yet approved or cleared by the [place] FDA and  has been authorized for detection and/or diagnosis of SARS-CoV-2 by FDA under an Emergency Use Authorization (EUA). This EUA will remain  in effect (meaning this test can be used) for the duration of the COVID-19 declaration under Section 564(b)(1) of the Act, 21 U.S.C.section 360bbb-3(b)(1), unless the authorization is terminated  or revoked sooner.       Influenza A by PCR NEGATIVE NEGATIVE Final   Influenza B by PCR NEGATIVE NEGATIVE Final    Comment: (NOTE) The Xpert Xpress SARS-CoV-2/FLU/RSV plus assay is intended as an aid in the diagnosis of influenza from Nasopharyngeal swab specimens and should  not be used as a sole basis for treatment. Nasal washings and aspirates are unacceptable for Xpert Xpress SARS-CoV-2/FLU/RSV testing.  Fact Sheet for Patients: EntrepreneurPulse.com.au  Fact Sheet for Healthcare Providers: IncredibleEmployment.be  This test is not yet approved or cleared by the [place] FDA and has been authorized for detection and/or diagnosis of SARS-CoV-2 by FDA under an Emergency Use Authorization (EUA). This EUA will remain in effect (meaning this test can be used) for the duration of the COVID-19 declaration under Section 564(b)(1) of the Act, 21 U.S.C. section 360bbb-3(b)(1), unless the authorization is terminated or revoked.  Performed at [facility], [place] [address], [place], [place] [address]   Culture, blood (Routine X 2) w Reflex to ID Panel     Status: None   Collection Time: 08/28/21 11:41 PM   Specimen: BLOOD  Result Value Ref Range Status   Specimen Description BLOOD LEFT ANTECUBITAL  Final   Special Requests AEROBIC BOTTLE ONLY Blood Culture adequate volume  Final   Culture   Final    NO GROWTH 5 DAYS Performed at [facility] [address] [place], [place] [address]    Report Status  09/03/2021 FINAL  Final  Culture, blood (Routine X 2) w Reflex to ID Panel     Status: Abnormal   Collection Time: 08/28/21 11:42 PM   Specimen: BLOOD LEFT FOREARM  Result Value Ref Range Status   Specimen Description BLOOD LEFT FOREARM  Final   Special Requests   Final    BOTTLES DRAWN AEROBIC AND ANAEROBIC Blood Culture adequate volume   Culture  Setup Time   Final    GRAM POSITIVE COCCI IN CLUSTERS IN BOTH AEROBIC AND ANAEROBIC BOTTLES CRITICAL RESULT CALLED TO, READ BACK BY AND VERIFIED WITH: Burnett Kanaris [id number] AT 1831 BY CM Performed at [facility], [place] [address], [place], [place] [address]    Culture STAPHYLOCOCCUS AUREUS (A)  Final   Report Status 08/31/2021 FINAL  Final   Organism ID,  Bacteria STAPHYLOCOCCUS AUREUS  Final      Susceptibility   Staphylococcus aureus - MIC*    CIPROFLOXACIN <=0.5 SENSITIVE Sensitive     ERYTHROMYCIN >=8 RESISTANT Resistant     GENTAMICIN <=0.5 SENSITIVE Sensitive     OXACILLIN <=0.25 SENSITIVE Sensitive     TETRACYCLINE <=1 SENSITIVE Sensitive     VANCOMYCIN 1 SENSITIVE Sensitive     TRIMETH/SULFA <=10 SENSITIVE Sensitive     CLINDAMYCIN <=0.25 SENSITIVE Sensitive     RIFAMPIN <=0.5 SENSITIVE Sensitive     Inducible Clindamycin NEGATIVE Sensitive     * STAPHYLOCOCCUS AUREUS  Blood Culture ID Panel (Reflexed)     Status: Abnormal   Collection Time: 08/28/21 11:42 PM  Result Value Ref Range Status   Enterococcus faecalis NOT DETECTED NOT DETECTED Final   Enterococcus Faecium NOT DETECTED NOT DETECTED Final   Listeria monocytogenes NOT DETECTED NOT DETECTED Final   Staphylococcus species DETECTED (A) NOT DETECTED Final    Comment: CRITICAL RESULT CALLED TO, READ BACK BY AND VERIFIED WITH: PHARMD G BARR [id number] AT 1830 BY CM    Staphylococcus aureus (BCID) DETECTED (A) NOT DETECTED Final    Comment: CRITICAL RESULT CALLED TO, READ BACK BY AND VERIFIED WITH: PHARMD G BARR [id number] AT 1830 BY CM    Staphylococcus epidermidis NOT DETECTED NOT DETECTED Final   Staphylococcus lugdunensis NOT DETECTED NOT DETECTED Final   Streptococcus species NOT DETECTED NOT DETECTED Final   Streptococcus agalactiae NOT DETECTED NOT DETECTED Final   Streptococcus pneumoniae NOT DETECTED NOT DETECTED Final   Streptococcus pyogenes NOT DETECTED NOT DETECTED Final   A.calcoaceticus-baumannii NOT DETECTED NOT DETECTED Final   Bacteroides fragilis NOT DETECTED NOT DETECTED Final   Enterobacterales NOT DETECTED NOT DETECTED Final   Enterobacter cloacae complex NOT DETECTED NOT DETECTED Final   Escherichia coli NOT DETECTED NOT DETECTED Final   Klebsiella aerogenes NOT DETECTED NOT DETECTED Final   Klebsiella oxytoca NOT DETECTED NOT DETECTED Final    Klebsiella pneumoniae NOT DETECTED NOT DETECTED Final   Proteus species NOT DETECTED NOT DETECTED Final   Salmonella species NOT DETECTED NOT DETECTED Final   Serratia marcescens NOT DETECTED NOT DETECTED Final   Haemophilus influenzae NOT DETECTED NOT DETECTED Final   Neisseria meningitidis NOT DETECTED NOT DETECTED Final   Pseudomonas aeruginosa NOT DETECTED NOT DETECTED Final   Stenotrophomonas maltophilia NOT DETECTED NOT DETECTED Final   Candida albicans NOT DETECTED NOT DETECTED Final   Candida auris NOT DETECTED NOT DETECTED Final   Candida glabrata NOT DETECTED NOT DETECTED Final   Candida krusei NOT DETECTED NOT DETECTED Final   Candida parapsilosis NOT DETECTED NOT DETECTED Final  Candida tropicalis NOT DETECTED NOT DETECTED Final   Cryptococcus neoformans/gattii NOT DETECTED NOT DETECTED Final   Meth resistant mecA/C and MREJ NOT DETECTED NOT DETECTED Final    Comment: Performed at [facility], [place] [address], [place], [place] [address]  Culture, blood (routine x 2)     Status: None (Preliminary result)   Collection Time: 08/31/21  7:21 AM   Specimen: BLOOD  Result Value Ref Range Status   Specimen Description BLOOD LEFT ANTECUBITAL  Final   Special Requests   Final    BOTTLES DRAWN AEROBIC ONLY Blood Culture results may not be optimal due to an inadequate volume of blood received in culture bottles   Culture   Final    NO GROWTH 4 DAYS Performed at [facility], [place] [address], [place], [place] [address]    Report Status PENDING  Incomplete  Culture, blood (routine x 2)     Status: None (Preliminary result)   Collection Time: 08/31/21  7:25 AM   Specimen: BLOOD LEFT HAND  Result Value Ref Range Status   Specimen Description BLOOD LEFT HAND  Final   Special Requests   Final    BOTTLES DRAWN AEROBIC ONLY Blood Culture results may not be optimal due to an inadequate volume of blood received in culture bottles   Culture   Final    NO GROWTH 4 DAYS Performed  at [facility], [place] [address] [place], [place] [address]    Report Status PENDING  Incomplete     Discharge Instructions:   Discharge Instructions     [facility] pharmacist to adjust dose for Vancomycin, Aminoglycosides and other anti-infective therapies as requested by physician.   Complete by: As directed    [facility] to provide Cath Flo 75m   Complete by: As directed    Administer for PICC line occlusion and as ordered by physician for other access device issues.   Ambulatory referral to Neurology   Complete by: As directed    Follow up with stroke clinic NP (Jessica Vanschaick or CCecille Rubin if both not available, consider [facility]acher[facility] Dauer [facility].   Anaphylaxis Kit: Provided to treat any anaphylactic reaction to the medication being provided to the patient if First Dose or when requested by physician   Complete by: As directed    Epinephrine 125mml vial / amp: Administer 0.64m19m0.64ml54mubcutaneously once for moderate to severe anaphylaxis, nurse to call physician and pharmacy when reaction occurs and call 911 if needed for immediate care   Diphenhydramine 50mg31mIV vial: Administer 25-50mg 71mM PRN for first dose reaction, rash, itching, mild reaction, nurse to call physician and pharmacy when reaction occurs   Sodium Chloride 0.9% NS 500ml I41mdminister if needed for hypovolemic blood pressure drop or as ordered by physician after call to physician with anaphylactic reaction   Call MD for:  persistant nausea and vomiting   Complete by: As directed    Call MD for:  severe uncontrolled pain   Complete by: As directed    Call MD for:  temperature >100.4   Complete by: As directed    Change dressing on IV access line weekly and PRN   Complete by: As directed    Diet - low sodium heart healthy   Complete by: As directed    Discharge instructions   Complete by: As directed    Follow up with your primary care  provider in one week. Complete the course of antibiotics as prescribed. Follow up with infectious disease as has been scheduled..   Flush IV access with Sodium Chloride 0.9% and Heparin 10 units/ml or 100 units/ml   Complete by: As directed    Home infusion instructions - [facility]   Complete by: As directed    Instructions: Flush IV access with Sodium Chloride 0.9% and Heparin 10units/ml or 100units/ml   Change dressing on IV access line: Weekly and PRN   Instructions Cath Flo 31m: Administer for PICC Line occlusion and as ordered by physician for other access device   [facility] pharmacist to adjust dose for: Vancomycin, Aminoglycosides and other anti-infective therapies as requested by physician   Increase activity slowly   Complete by: As directed    Method of administration may be changed at the discretion of home infusion pharmacist based upon assessment of the patient and/or caregiver's ability to self-administer the medication ordered   Complete by: As directed    No wound care   Complete by: As directed       Allergies as of 09/05/2021   No Known Allergies      Medication List     STOP taking these medications    cephALEXin 500 MG capsule Commonly known as: KEFLEX   omeprazole 40 MG capsule Commonly known as: PRILOSEC       TAKE these medications    aspirin EC 81 MG tablet Take 1 tablet (81 mg total) by mouth daily. Swallow whole.   atorvastatin 40 MG tablet Commonly known as: LIPITOR Take 1 tablet (40 mg total) by mouth daily. Start taking on: September 06, 2021   ceFAZolin  IVPB Commonly known as: ANCEF Inject 2 g into the vein every 8 (eight) hours for 10 days. Indication:  MSSA Bacteremia First Dose: Yes Last Day of Therapy:  09/14/21 Labs - Once weekly:  CBC/D and BMP, Labs - Every other week:  ESR and CRP Method of administration: IV Push Method of administration may be changed at the discretion of home infusion pharmacist  based upon assessment of the patient and/or caregiver's ability to self-administer the medication ordered.   clopidogrel 75 MG tablet Commonly known as: PLAVIX Take 1 tablet (75 mg total) by mouth daily for 21 days. Start taking on: September 06, 2021   clotrimazole 1 % cream Commonly known as: LOTRIMIN Apply topically 2 (two) times daily. Apply on the buttocks area too   conjugated estrogens vaginal cream Commonly known as: PREMARIN Place vaginally at bedtime.   cyanocobalamin 1000 MCG tablet Take 1 tablet (1,000 mcg total) by mouth daily. Start taking on: September 06, 2021   fluconazole 100 MG tablet Commonly known as: DIFLUCAN Take 1 tablet (100 mg total) by mouth daily for 6 days. Start taking on: September 06, 2021   insulin glargine 100 UNIT/ML injection Commonly known as: LANTUS Inject 0.25 mLs (25 Units total) into the skin at bedtime. What changed: how much to take   losartan 50 MG tablet Commonly known as: COZAAR Take 50 mg by mouth daily.   metFORMIN 1000 MG tablet Commonly known as: GLUCOPHAGE Take 1,000 mg by mouth 2 (two) times daily.   metoprolol tartrate 25 MG tablet Commonly known as: LOPRESSOR Take 1 tablet (25 mg total) by mouth 2 (two) times daily.   nystatin 100000 UNIT/ML suspension Commonly known as: MYCOSTATIN Take 5 mLs (500,000 Units total) by mouth 4 (four) times daily for 5 days.   pantoprazole  40 MG tablet Commonly known as: Protonix Take 1 tablet (40 mg total) by mouth daily.               Durable Medical Equipment  (From admission, onward)           Start     Ordered   09/01/21 1651  For home use only DME Hospital bed  Once       Question Answer Comment  Length of Need Lifetime   The above medical condition requires: Patient requires the ability to reposition frequently   Head must be elevated greater than: 30 degrees   Bed type Semi-electric   Hoyer Lift Yes   Support Surface: Gel Overlay      09/01/21 1651    08/31/21 1650  For home use only DME 3 n 1  Once        08/31/21 1650   08/31/21 1649  For home use only DME Walker rolling  Once       Question Answer Comment  Walker: With [place]   Patient needs a walker to treat with the following condition Weakness      08/31/21 1650              Discharge Care Instructions  (From admission, onward)           Start     Ordered   09/05/21 0000  Change dressing on IV access line weekly and PRN  (Home infusion instructions - [facility] )        09/05/21 0927            Follow-up Information     [place]. Go to .   Specialty: Emergency Medicine Why: If symptoms worsen Contact information: [address] [id number] [place] [phone]        [facility]. Schedule an appointment as soon as possible for a visit in 1 month(s).   Specialty: Neurology Why: stroke clinic Contact information: [place] [phone]        Pixie Casino, MD Follow up.   Specialty: Cardiology Why: his office should call you on Monday to schedule an appt in about 4 weeks. if you have not heard by Wed. please call his office. Contact information: [place] [address] [phone]         Golden Circle, FNP Follow up.   Specialty: Infectious Diseases Why: 10/10 at 3 pm. Please call to reschedule if you are not able to make this appointment. Contact information: [place] [address] [phone]                  Time coordinating discharge: 39 minutes  Signed:  Valor [place]  [facility] 09/05/2021, 9:55 AM

## 2021-09-05 NOTE — Plan of Care (Signed)
  Problem: Education: Goal: Knowledge of General Education information will improve Description Including pain rating scale, medication(s)/side effects and non-pharmacologic comfort measures Outcome: Progressing   

## 2021-09-05 NOTE — Progress Notes (Signed)
Inpatient Diabetes Program Recommendations  AACE/ADA: New Consensus Statement on Inpatient Glycemic Control (2015)  Target Ranges:  Prepandial:   less than 140 mg/dL      Peak postprandial:   less than 180 mg/dL (1-2 hours)      Critically ill patients:  140 - 180 mg/dL   Lab Results  Component Value Date   GLUCAP 254 (H) 09/05/2021   HGBA1C 13.3 (H) 08/28/2021    Spoke with pt at bedside and face timed [name], Daughter for [employer] and operation. Freestyle libre placed on left arm, supplies and reader placed in pt belonging and placed in pt closet. Daughter knows where supplies are.  Thanks,  Tama Headings RN, MSN, BC-ADM Inpatient Diabetes Coordinator Team Pager [phone] (8a-5p)

## 2021-09-05 NOTE — TOC Transition Note (Signed)
Transition of Care [facility]) - CM/SW Discharge Note   Patient Details  Name: Sonya Duffy MRN: [id number] Date of Birth: 24-Aug-1946  [facility] CM/SW Contact:  Carles Collet, RN Phone Number: 09/05/2021, 11:01 AM   Clinical Narrative:     DME has been delivered to home, per nurse's conversation w daughter this morning, and she will be here between 3-4 to take patient home. Pam w [facility] confirms that medications will be delivered to the home this morning. And she states she spoke to the daughter who knows that she will give the night dose at home to her mom, and [facility] will start tomorrow w [phone]. CM requested nurse to give mid day dose of abx before sending patient home.  No other CM needs identified.      Barriers to Discharge: Continued Medical Work up   Patient Goals and CMS Choice        Discharge Placement                       Discharge Plan and Services   Discharge Planning Services: CM Consult            DME Arranged: 3-N-1, Hospital bed, Walker rolling   Date DME Agency Contacted: 08/31/21 Time DME Agency Contacted: [phone] Representative spoke with at DME Agency: Freda Munro HH Arranged: RN, PT, OT, Nurse's Aide [place] Agency: [place] Date [place]: 09/04/21 Time [place]: [phone] Representative spoke with at [place]: Amy  Social Determinants of Health ([place]) Interventions     Readmission Risk Interventions No flowsheet data found.

## 2021-09-05 NOTE — Progress Notes (Signed)
   09/05/21 1719  AVS Discharge Documentation  AVS Discharge Instructions Including Medications Placed in discharge packet for receiving facility;Provided to patient/caregiver  Name of Person Receiving AVS Discharge Instructions Including Medications Karl Ito and Daughter [name]  Name of Clinician That Reviewed AVS Discharge Instructions Including Medications Venida Jarvis, [place]

## 2021-09-17 ENCOUNTER — Other Ambulatory Visit: Payer: Self-pay

## 2021-09-17 ENCOUNTER — Encounter: Payer: Self-pay | Admitting: Family

## 2021-09-17 ENCOUNTER — Ambulatory Visit: Payer: Medicare Other | Admitting: Family

## 2021-09-17 VITALS — BP 165/83 | HR 87 | Temp 97.9°F | Wt 132.8 lb

## 2021-09-17 DIAGNOSIS — R7881 Bacteremia: Secondary | ICD-10-CM | POA: Diagnosis not present

## 2021-09-17 DIAGNOSIS — B9561 Methicillin susceptible Staphylococcus aureus infection as the cause of diseases classified elsewhere: Secondary | ICD-10-CM

## 2021-09-17 NOTE — Patient Instructions (Signed)
Nice to see you.  No further antibiotics are needed at this time and will continue to monitor for changes in behavior, fevers, chills, sweats.   We will attempt to set up repeat blood cultures with your home health in 2 weeks.   Plan for follow up pending lab work results as needed.   Have a great day and stay safe!

## 2021-09-17 NOTE — Progress Notes (Signed)
Subjective:    Patient ID: Sonya Duffy, female    DOB: 1946-07-27, 75 y.o.   MRN: [id number]  Chief Complaint  Patient presents with   MSSA Bacteremia    HPI:  Sonya Duffy is a 75 y.o. female with previous medical history of diabetes and hypertension admitted with complaints of weakness and found to have diabetic ketoacidosis secondary to stopping insulin due to an exacerbation of depression and noted to have fevers with blood cultures positive for MSSA.  No clear source of infection was identified.  TTE was without evidence of endocarditis.  Blood cultures cleared quickly and treatment plan for simple bacteremia with 2 weeks of cefazolin.  End date was planned for 09/14/2021.  Here today for hospitalization follow-up.  Ms. Foronda is here today with her daughter who provides some of the history.  She completed her antibiotics on 14/03/3153 without complication.  PICC line removed earlier today.  She is feeling well since leaving the hospital and is increasing her appetite as well as physical endurance.  No systemic symptoms including fevers, chills, or sweats.  She is ambulating with a walker and currently staying with her daughter.  Goal for disposition is home to her house.  She continues to work with [facility].  No Known Allergies    Outpatient Medications Prior to Visit  Medication Sig Dispense Refill   aspirin EC 81 MG tablet Take 1 tablet (81 mg total) by mouth daily. Swallow whole. 150 tablet 2   atorvastatin (LIPITOR) 40 MG tablet Take 1 tablet (40 mg total) by mouth daily. 30 tablet 11   clopidogrel (PLAVIX) 75 MG tablet Take 1 tablet (75 mg total) by mouth daily for 21 days. 21 tablet 0   clotrimazole (LOTRIMIN) 1 % cream Apply topically 2 (two) times daily. Apply on the buttocks area too 30 g 0   conjugated estrogens (PREMARIN) vaginal cream Place vaginally at bedtime. 42.5 g 12   insulin glargine (LANTUS) 100 UNIT/ML injection Inject 0.25 mLs (25  Units total) into the skin at bedtime. 10 mL 11   losartan (COZAAR) 50 MG tablet Take 50 mg by mouth daily.     metFORMIN (GLUCOPHAGE) 1000 MG tablet Take 1,000 mg by mouth 2 (two) times daily.     metoprolol tartrate (LOPRESSOR) 25 MG tablet Take 1 tablet (25 mg total) by mouth 2 (two) times daily. 60 tablet 2   pantoprazole (PROTONIX) 40 MG tablet Take 1 tablet (40 mg total) by mouth daily. 30 tablet 1   vitamin B-12 1000 MCG tablet Take 1 tablet (1,000 mcg total) by mouth daily. 30 tablet 3   No facility-administered medications prior to visit.     Past Medical History:  Diagnosis Date   Diabetes mellitus    Hypertension      Past Surgical History:  Procedure Laterality Date   CHOLECYSTECTOMY      Review of Systems  Constitutional:  Negative for chills, diaphoresis, fatigue and fever.  Respiratory:  Negative for cough, chest tightness, shortness of breath and wheezing.   Cardiovascular:  Negative for chest pain.  Gastrointestinal:  Negative for abdominal pain, diarrhea, nausea and vomiting.     Objective:    BP (!) 165/83   Pulse 87   Temp 97.9 F (36.6 C) (Oral)   Wt 132 lb 12.8 oz (60.2 kg)   SpO2 100%   BMI 24.29 kg/m  Nursing note and vital signs reviewed.  Physical Exam Constitutional:  General: She is not in acute distress.    Appearance: She is well-developed.  Cardiovascular:     Rate and Rhythm: Normal rate and regular rhythm.     Heart sounds: Normal heart sounds.  Pulmonary:     Effort: Pulmonary effort is normal.     Breath sounds: Normal breath sounds.  Skin:    General: Skin is warm and dry.  Neurological:     Mental Status: She is alert and oriented to person, place, and time.  Psychiatric:        Mood and Affect: Mood normal.     No flowsheet data found.     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 Occhipinti has completed treatment with cefazolin for simple MSSA bacteremia with no clear source of infection.  She continues to improve since leaving the hospital.  There are no current signs/symptoms concerning for infection.  Discussed plan of care to include stopping antibiotics at this point and will arrange for surveillance cultures in 2 weeks.  Reviewed signs/symptoms to watch out for during watchful waiting period.  Plan for follow-up as needed pending blood culture results.        I am having Gregary Signs A. Lackey maintain her losartan, metFORMIN, atorvastatin, clopidogrel, clotrimazole, conjugated estrogens, insulin glargine, metoprolol tartrate, cyanocobalamin, aspirin EC, and pantoprazole.   Follow-up: Return if symptoms worsen or fail to improve.   Terri Piedra, MSN, FNP-C Nurse Practitioner [facility] for Infectious Disease [place] number: [phone]

## 2021-09-17 NOTE — Assessment & Plan Note (Signed)
Sonya Duffy has completed treatment with cefazolin for simple MSSA bacteremia with no clear source of infection.  She continues to improve since leaving the hospital.  There are no current signs/symptoms concerning for infection.  Discussed plan of care to include stopping antibiotics at this point and will arrange for surveillance cultures in 2 weeks.  Reviewed signs/symptoms to watch out for during watchful waiting period.  Plan for follow-up as needed pending blood culture results.

## 2021-09-28 ENCOUNTER — Emergency Department (HOSPITAL_COMMUNITY): Payer: Medicare Other

## 2021-09-28 ENCOUNTER — Emergency Department (HOSPITAL_COMMUNITY)
Admission: EM | Admit: 2021-09-28 | Discharge: 2021-09-28 | Disposition: A | Discharge status: Home / Self Care | Payer: Medicare Other | Attending: Emergency Medicine | Admitting: Emergency Medicine

## 2021-09-28 ENCOUNTER — Encounter (HOSPITAL_COMMUNITY): Payer: Self-pay | Admitting: Emergency Medicine

## 2021-09-28 DIAGNOSIS — S30810A Abrasion of lower back and pelvis, initial encounter: Secondary | ICD-10-CM | POA: Insufficient documentation

## 2021-09-28 DIAGNOSIS — R531 Weakness: Secondary | ICD-10-CM | POA: Diagnosis not present

## 2021-09-28 DIAGNOSIS — F0390 Unspecified dementia without behavioral disturbance: Secondary | ICD-10-CM | POA: Diagnosis not present

## 2021-09-28 DIAGNOSIS — Z7984 Long term (current) use of oral hypoglycemic drugs: Secondary | ICD-10-CM | POA: Insufficient documentation

## 2021-09-28 DIAGNOSIS — Z7982 Long term (current) use of aspirin: Secondary | ICD-10-CM | POA: Insufficient documentation

## 2021-09-28 DIAGNOSIS — Z7902 Long term (current) use of antithrombotics/antiplatelets: Secondary | ICD-10-CM | POA: Insufficient documentation

## 2021-09-28 DIAGNOSIS — E1110 Type 2 diabetes mellitus with ketoacidosis without coma: Secondary | ICD-10-CM | POA: Diagnosis not present

## 2021-09-28 DIAGNOSIS — N390 Urinary tract infection, site not specified: Secondary | ICD-10-CM

## 2021-09-28 DIAGNOSIS — Z79899 Other long term (current) drug therapy: Secondary | ICD-10-CM | POA: Insufficient documentation

## 2021-09-28 DIAGNOSIS — I10 Essential (primary) hypertension: Secondary | ICD-10-CM | POA: Insufficient documentation

## 2021-09-28 DIAGNOSIS — R339 Retention of urine, unspecified: Secondary | ICD-10-CM

## 2021-09-28 DIAGNOSIS — Z794 Long term (current) use of insulin: Secondary | ICD-10-CM | POA: Insufficient documentation

## 2021-09-28 DIAGNOSIS — Z20822 Contact with and (suspected) exposure to covid-19: Secondary | ICD-10-CM | POA: Insufficient documentation

## 2021-09-28 DIAGNOSIS — X58XXXA Exposure to other specified factors, initial encounter: Secondary | ICD-10-CM | POA: Insufficient documentation

## 2021-09-28 DIAGNOSIS — D72829 Elevated white blood cell count, unspecified: Secondary | ICD-10-CM | POA: Insufficient documentation

## 2021-09-28 DIAGNOSIS — S3993XA Unspecified injury of pelvis, initial encounter: Secondary | ICD-10-CM | POA: Diagnosis present

## 2021-09-28 LAB — CBC
HCT: 33.8 % — ABNORMAL LOW (ref 36.0–46.0)
Hemoglobin: 10.4 g/dL — ABNORMAL LOW (ref 12.0–15.0)
MCH: 27.8 pg (ref 26.0–34.0)
MCHC: 30.8 g/dL (ref 30.0–36.0)
MCV: 90.4 fL (ref 80.0–100.0)
Platelets: 477 10*3/uL — ABNORMAL HIGH (ref 150–400)
RBC: 3.74 MIL/uL — ABNORMAL LOW (ref 3.87–5.11)
RDW: 14.6 % (ref 11.5–15.5)
WBC: 18.6 10*3/uL — ABNORMAL HIGH (ref 4.0–10.5)
nRBC: 0 % (ref 0.0–0.2)

## 2021-09-28 LAB — COMPREHENSIVE METABOLIC PANEL
ALT: 22 U/L (ref 0–44)
AST: 32 U/L (ref 15–41)
Albumin: 3.2 g/dL — ABNORMAL LOW (ref 3.5–5.0)
Alkaline Phosphatase: 566 U/L — ABNORMAL HIGH (ref 38–126)
Anion gap: 12 (ref 5–15)
BUN: 25 mg/dL — ABNORMAL HIGH (ref 8–23)
CO2: 16 mmol/L — ABNORMAL LOW (ref 22–32)
Calcium: 8.5 mg/dL — ABNORMAL LOW (ref 8.9–10.3)
Chloride: 105 mmol/L (ref 98–111)
Creatinine, Ser: 1.36 mg/dL — ABNORMAL HIGH (ref 0.44–1.00)
GFR, Estimated: 40 mL/min — ABNORMAL LOW (ref >60)
Glucose, Bld: 158 mg/dL — ABNORMAL HIGH (ref 70–99)
Potassium: 4.8 mmol/L (ref 3.5–5.1)
Sodium: 133 mmol/L — ABNORMAL LOW (ref 135–145)
Total Bilirubin: 0.8 mg/dL (ref 0.3–1.2)
Total Protein: 8.5 g/dL — ABNORMAL HIGH (ref 6.5–8.1)

## 2021-09-28 LAB — URINALYSIS, ROUTINE W REFLEX MICROSCOPIC
Bacteria, UA: NONE SEEN
Bilirubin Urine: NEGATIVE
Glucose, UA: NEGATIVE mg/dL
Ketones, ur: NEGATIVE mg/dL
Nitrite: POSITIVE — AB
Protein, ur: 100 mg/dL — AB
Specific Gravity, Urine: 1.009 (ref 1.005–1.030)
WBC, UA: >50 WBC/hpf — ABNORMAL HIGH (ref 0–5)
pH: 5 (ref 5.0–8.0)

## 2021-09-28 LAB — RESP PANEL BY RT-PCR (FLU A&B, COVID) ARPGX2
Influenza A by PCR: NEGATIVE
Influenza B by PCR: NEGATIVE
SARS Coronavirus 2 by RT PCR: NEGATIVE

## 2021-09-28 LAB — TYPE AND SCREEN
ABO/RH(D): A NEG
Antibody Screen: NEGATIVE

## 2021-09-28 LAB — LACTIC ACID, PLASMA: Lactic Acid, Venous: 1.6 mmol/L (ref 0.5–1.9)

## 2021-09-28 LAB — POC OCCULT BLOOD, ED

## 2021-09-28 IMAGING — DX DG CHEST 1V PORT
1 series · 1 of 1 positions shown · non-contrast
Comparison: [DATE]

CLINICAL DATA: Dark red bloody stools.

EXAM:
PORTABLE CHEST 1 VIEW

[chest ap]
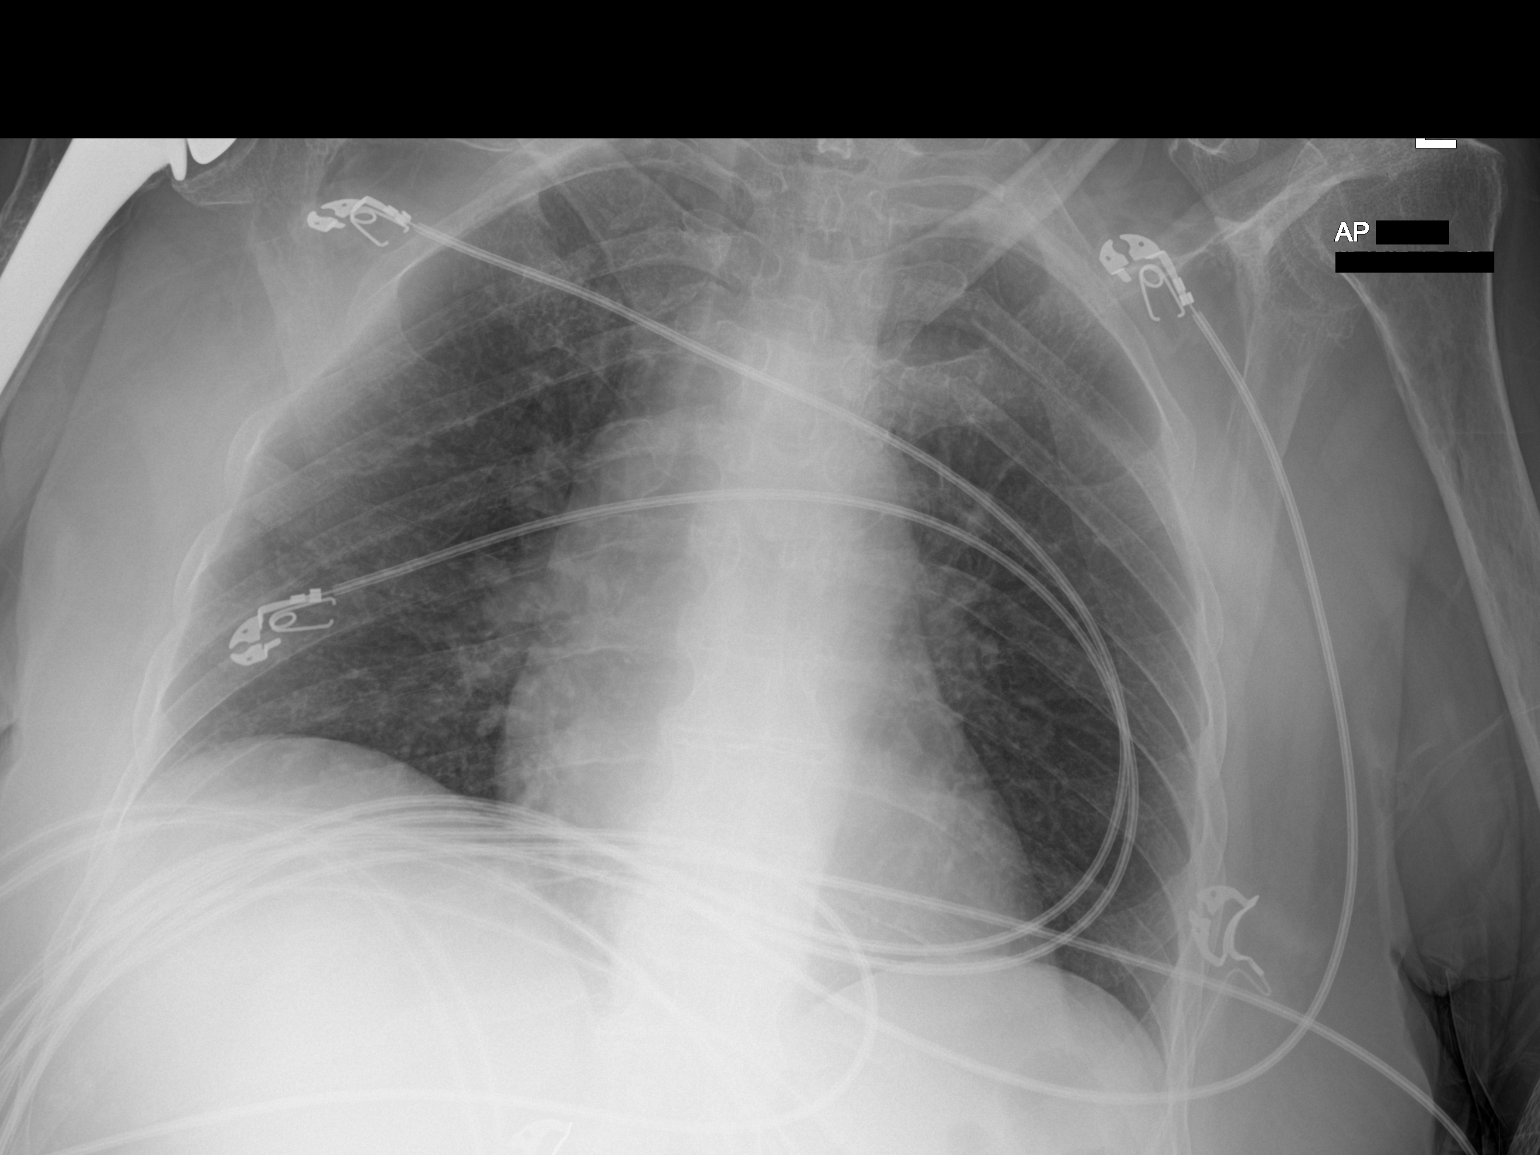

[1 of 1 positions shown; findings below may reference images not displayed]

FINDINGS: The heart size and mediastinal contours are within normal limits.
Both lungs are clear. An intact right shoulder replacement is noted.
IMPRESSION: No active disease.

## 2021-09-28 IMAGING — CT CT ABD-PELV W/ CM
2 of 5 series · 16 of 46 positions shown, 18 images · IV contrast (omnipaque)
Comparison: CT abdomen pelvis [DATE]

CLINICAL DATA: Diverticulitis suspected.

EXAM:
CT ABDOMEN AND PELVIS WITH CONTRAST
TECHNIQUE: Multidetector CT imaging of the abdomen and pelvis was performed
using the standard protocol following bolus administration of
intravenous contrast.
CONTRAST:  60mL OMNIPAQUE IOHEXOL 350 MG/ML SOLN

[Series 2: axial st · axial · 0.75mm/px · z∈[-432,-27]mm · 13 of 95 slices shown, 15 images]
[im 7/95  soft-tissue]
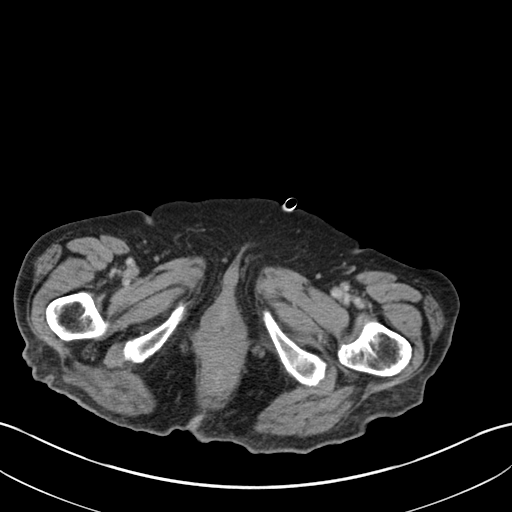
[im 7/95  bone]
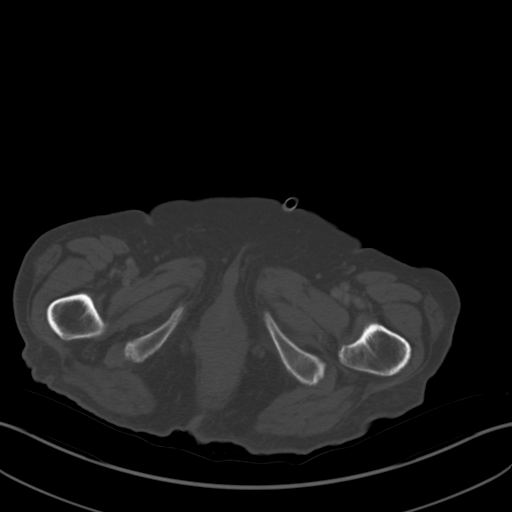
[im 14/95  soft-tissue]
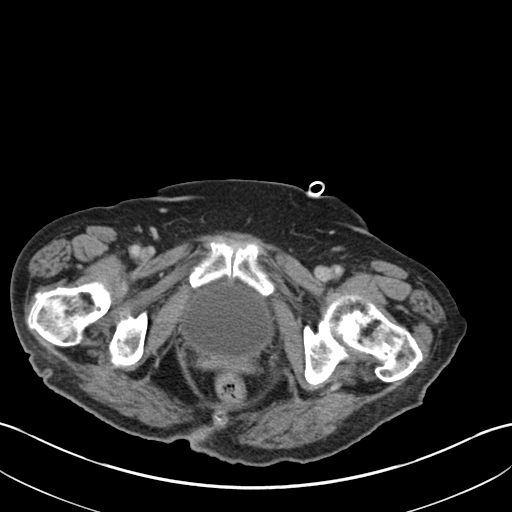
[im 21/95  soft-tissue]
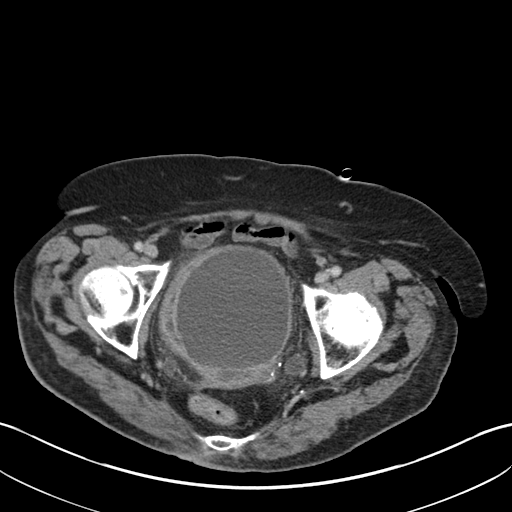
[im 27/95  soft-tissue]
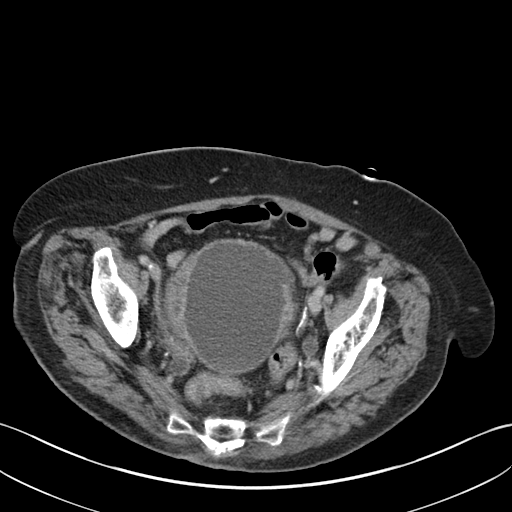
[im 34/95  soft-tissue]
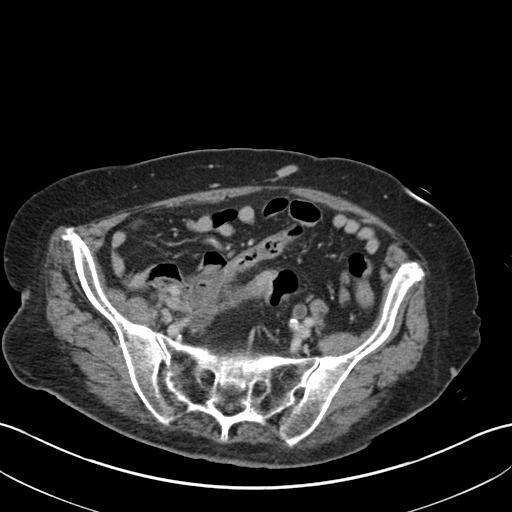
[im 41/95  soft-tissue]
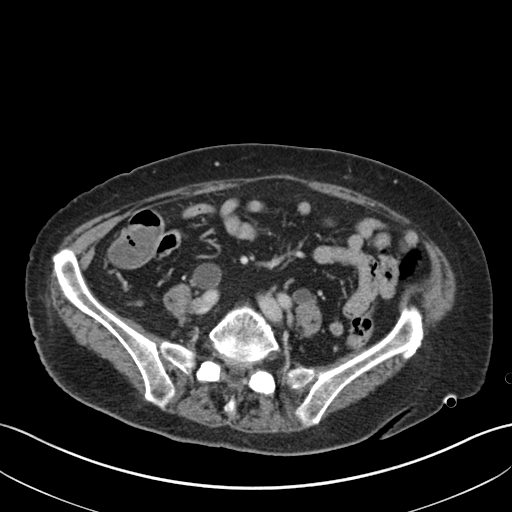
[im 48/95  soft-tissue]
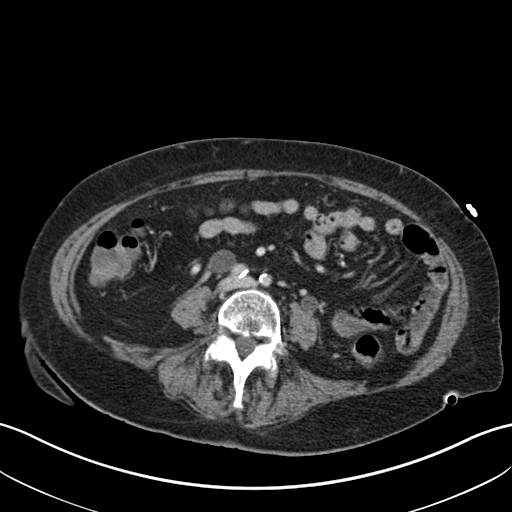
[im 54/95  soft-tissue]
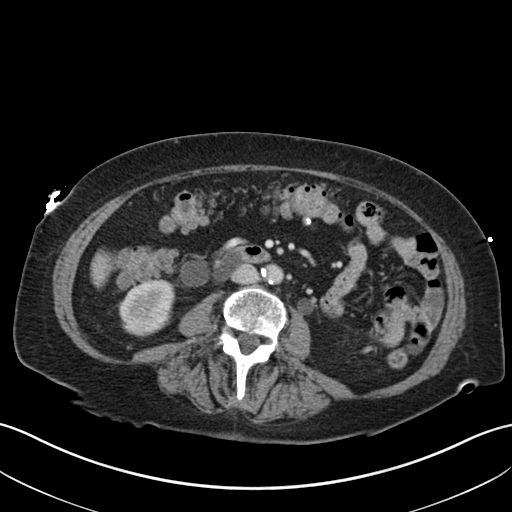
[im 61/95  soft-tissue]
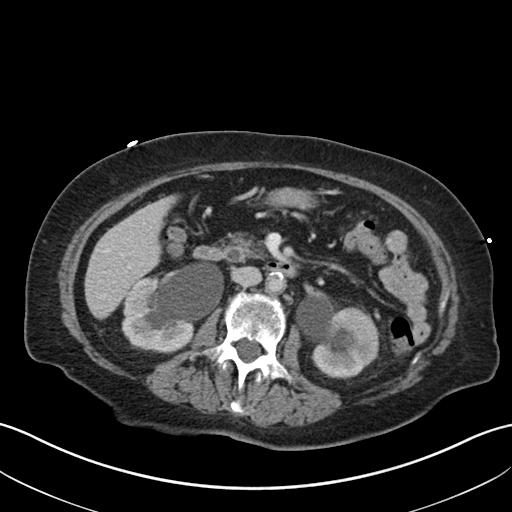
[im 61/95  bone]
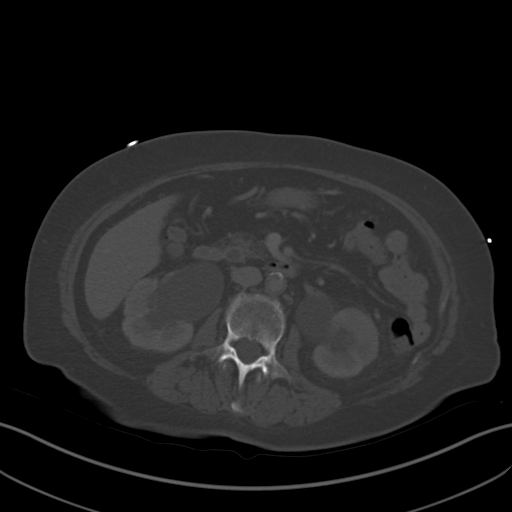
[im 68/95  soft-tissue]
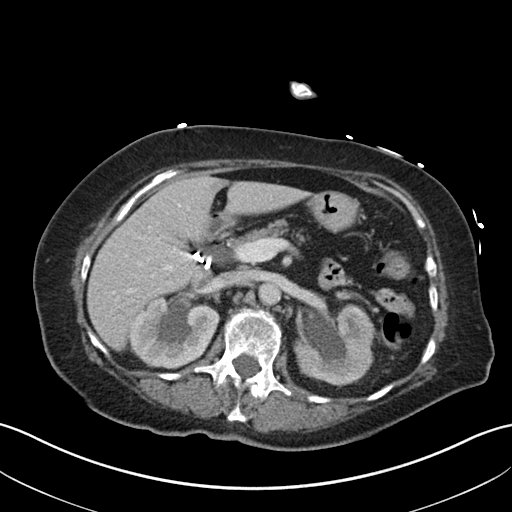
[im 74/95  soft-tissue]
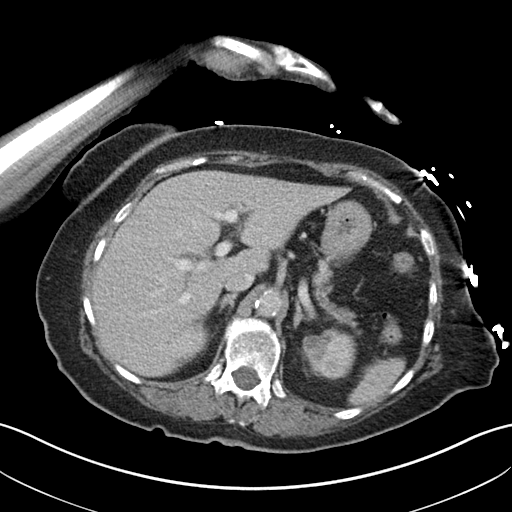
[im 81/95  soft-tissue]
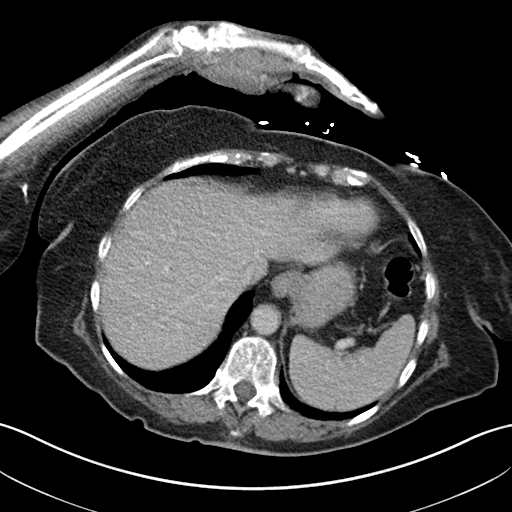
[im 88/95  soft-tissue]
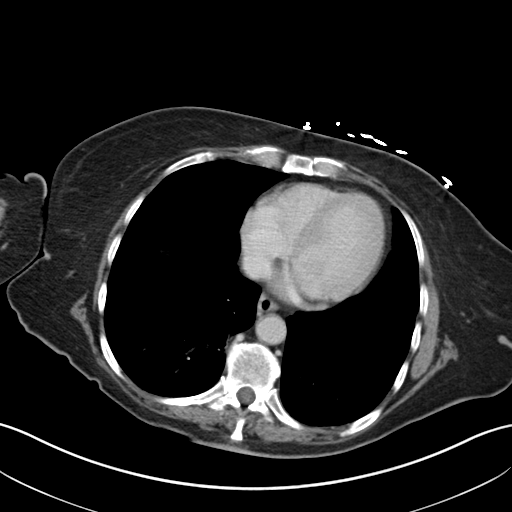

[Series 5: coronal st · coronal · 0.82mm/px · 3 of 149 slices shown]
[im 50/149  soft-tissue]
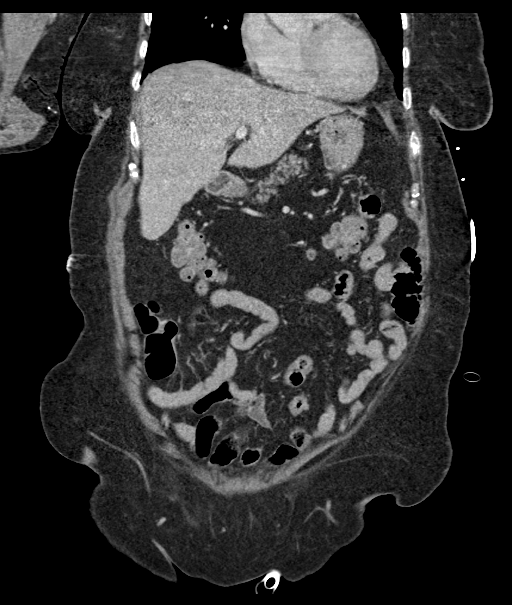
[im 66/149  soft-tissue]
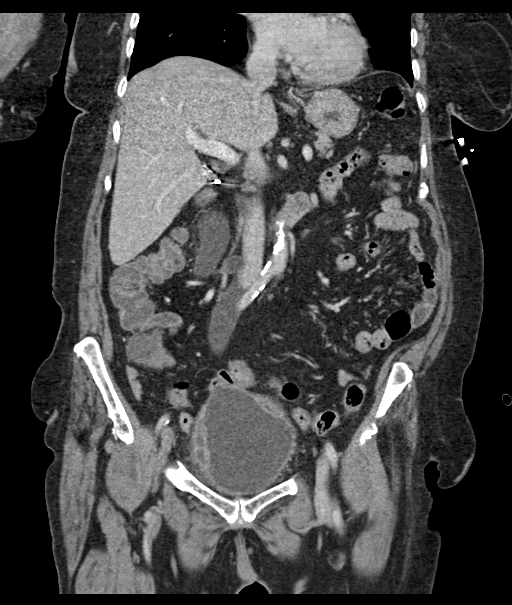
[im 83/149  soft-tissue]
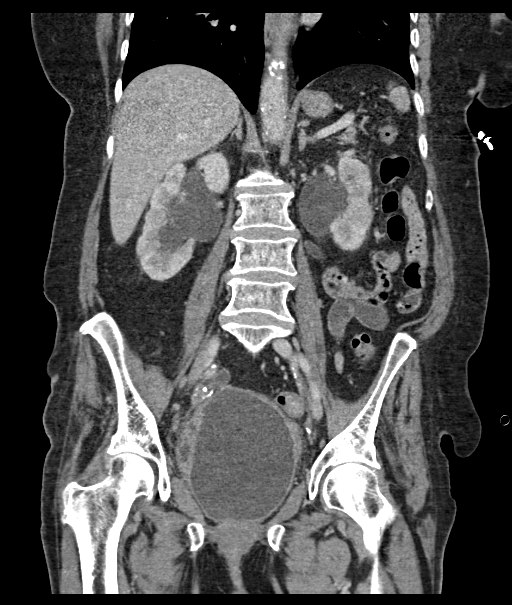

[16 of 46 positions shown; findings below may reference images not displayed]

FINDINGS: Lower chest: Passive atelectasis of the right lower lobe. Otherwise
no acute abnormality. 2 mm pulmonary micronodule within the right
middle lobe.

Hepatobiliary: No focal liver abnormality. Status post
cholecystectomy. No biliary dilatation.

Pancreas: No focal lesion. Normal pancreatic contour. No surrounding
inflammatory changes. No main pancreatic ductal dilatation.

Spleen: Normal in size without focal abnormality. A splenule is
noted.

Adrenals/Urinary Tract:

No adrenal nodule bilaterally.

Bilateral kidneys enhance symmetrically.

No hydronephrosis. No hydroureter. Bilateral severe
hydroureteronephrosis.

Irregular urinary bladder wall thickening with associated
diverticula.

On delayed imaging, trace excretion of intravenous contrast starting
within bilateral kidneys.

Stomach/Bowel: Stomach is within normal limits. No evidence of bowel
wall thickening or dilatation. Few scattered colonic diverticula.
Appendix appears normal.

Vascular/Lymphatic: No abdominal aorta or iliac aneurysm. Severe
atherosclerotic plaque of the aorta and its branches. No abdominal,
pelvic, or inguinal lymphadenopathy.

Reproductive: Status post hysterectomy. No adnexal masses.

Other: No intraperitoneal free fluid. No intraperitoneal free gas.
No organized fluid collection.

Musculoskeletal:

No abdominal wall hernia or abnormality.

No suspicious lytic or blastic osseous lesions. No acute displaced
fracture. Multilevel degenerative changes of the spine. Grade 1
anterolisthesis of L4 on L5.
IMPRESSION: 1. Persistent circumferential irregular urinary bladder wall
thickening with suggestion of associated diverticula. Interval
development of bilateral severe hydroureteronephrosis. These
findings reflect chronic changes of obstructive uropathy or reflux.
Underlying malignancy not excluded. Recommend urologic consultation.
2.  Aortic Atherosclerosis ([DS]-[DS]).

## 2021-09-28 MED ORDER — IOHEXOL 350 MG/ML SOLN
60.0000 mL | Freq: Once | INTRAVENOUS | Status: AC | PRN
  Administered 2021-09-28: 60 mL via INTRAVENOUS

## 2021-09-28 MED ORDER — MORPHINE SULFATE (PF) 2 MG/ML IV SOLN
2.0000 mg | Freq: Once | INTRAVENOUS | Status: AC
  Administered 2021-09-28: 2 mg via INTRAVENOUS
  Filled 2021-09-28: qty 1

## 2021-09-28 MED ORDER — SODIUM CHLORIDE 0.9 % IV SOLN
2.0000 g | Freq: Once | INTRAVENOUS | Status: AC
  Administered 2021-09-28: 2 g via INTRAVENOUS
  Filled 2021-09-28: qty 20

## 2021-09-28 MED ORDER — CEPHALEXIN 500 MG PO CAPS: 500.0000 mg | ORAL_CAPSULE | Freq: Four times a day (QID) | ORAL | 0 refills | Status: AC

## 2021-09-28 MED ORDER — SODIUM CHLORIDE 0.9 % IV BOLUS
1000.0000 mL | Freq: Once | INTRAVENOUS | Status: AC
  Administered 2021-09-28: 1000 mL via INTRAVENOUS

## 2021-09-28 NOTE — ED Notes (Signed)
Lactic acid and BC x 1  sent

## 2021-09-28 NOTE — ED Provider Notes (Signed)
Emergency Medicine Provider Triage Evaluation Note  Sonya Duffy , a 75 y.o. female  was evaluated in triage.  Pt complains of GIB.  Review of Systems  Positive: Weakness, fatigue, darker stool, darker urine, increase urinary frequency, pain to lower pannus, sores on bottom Negative: Fever, cough  Physical Exam  BP (!) 183/97 (BP Location: Left Arm)   Pulse (!) 105   Temp 98 F (36.7 C) (Oral)   Resp 14   SpO2 99%  Gen:   Awake, no distress   Resp:  Normal effort  MSK:   Moves extremities without difficulty  Other:    Medical Decision Making  Medically screening exam initiated at 6:07 PM.  Appropriate orders placed.  [name] was informed that the remainder of the evaluation will be completed by another provider, this initial triage assessment does not replace that evaluation, and the importance of remaining in the ED until their evaluation is complete.  Pt sent here from PCP office with concerns of GIB.  Pt report increase fatigue x 1 week, along with pain to lower abdomen, daughter notice darker stool and darker urine, and pt appears pale looking.  Not on any anticoagulant. Was hospitalized last month with DKA, UTI, sepsis, stroke and MI according to daughter.      Domenic Moras, PA-C 09/28/21 1814    Lacretia Leigh, MD 10/01/21 [phone]

## 2021-09-28 NOTE — ED Triage Notes (Signed)
Patient here from home brought in by daughter reporting dark red blood in stools concerning for pcp whom referred her here. 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 "Appears pale" per daughter. Walks with walker. Sepsis last month with stroke and MI.

## 2021-09-28 NOTE — Discharge Instructions (Addendum)
You came to the emergency department today to be evaluated for your frequent urination and concern for possible GI bleed.  Your rectal exam and lab work showed no signs of a GI bleed.  Your lab work did show signs of a urinary tract infection.  Because of this you were started on antibiotics in the emergency department and prescribed antibiotics moving forward.  Please take these medications as prescribed.  There was concern for urinary retention, due to this a Foley catheter was placed.  This will need to remain in place until you can follow-up with your urologist.  Get help right away if: You have severe pain in your back or your lower abdomen. You have a fever or chills. You have nausea or vomiting.

## 2021-09-28 NOTE — ED Provider Notes (Signed)
[facility] DEPT Provider Note   CSN: [id number] Arrival date & time: 09/28/21  1738     History Chief Complaint  Patient presents with   GI Bleeding    Sonya Duffy is a 75 y.o. female with history of diabetes mellitus, hypertension, dementia, NSTEMI, MSSA bacteremia.  Patient is found to be alert to person and place.  Daughter at bedside reports that patient has history of dementia and is at her mental baseline at this time.  Level 5 caveat applies.  Patient's daughter reports that she is pale and has been having dark stools over the last few days.  Patient was sent from PCP due to concern for GI bleed.  Endorse that patient has had generalized weakness and fatigue.  Patient is on Plavix.  Daughter also reports that patient has been having frequent urination and dark-colored urine.  States that she has chronic urinary tract infection, however is not on any antibiotics for her.  Patient complains of left lower quadrant abdominal pain.  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     Family History  Problem Relation Age of Onset   Heart attack Mother     Social History   Tobacco Use   Smoking status: Never   Smokeless tobacco: Never  Substance Use Topics   Alcohol use: No   Drug use: No    Home Medications Prior to Admission medications   Medication Sig Start Date End Date Taking? Authorizing Provider  aspirin EC 81 MG tablet Take 1 tablet (81 mg total) by mouth daily. Swallow whole.  09/05/21 09/05/22  Pokhrel, Corrie Mckusick, MD  atorvastatin (LIPITOR) 40 MG tablet Take 1 tablet (40 mg total) by mouth daily. 09/06/21 09/06/22  Pokhrel, Corrie Mckusick, MD  clotrimazole (LOTRIMIN) 1 % cream Apply topically 2 (two) times daily. Apply on the buttocks area too 09/05/21   Pokhrel, Corrie Mckusick, MD  conjugated estrogens (PREMARIN) vaginal cream Place vaginally at bedtime. 09/05/21   Pokhrel, Corrie Mckusick, MD  insulin glargine (LANTUS) 100 UNIT/ML injection Inject 0.25 mLs (25 Units total) into the skin at bedtime. 09/05/21   Pokhrel, Corrie Mckusick, MD  losartan (COZAAR) 50 MG tablet Take 50 mg by mouth daily. 08/17/19   [provider]  metFORMIN (GLUCOPHAGE) 1000 MG tablet Take 1,000 mg by mouth 2 (two) times daily. 08/26/19   [provider]  metoprolol tartrate (LOPRESSOR) 25 MG tablet Take 1 tablet (25 mg total) by mouth 2 (two) times daily. 09/05/21   Pokhrel, Corrie Mckusick, MD  pantoprazole (PROTONIX) 40 MG tablet Take 1 tablet (40 mg total) by mouth daily. 09/05/21 09/05/22  Pokhrel, Corrie Mckusick, MD  vitamin B-12 1000 MCG tablet Take 1 tablet (1,000 mcg total) by mouth daily. 09/06/21 12/15/21  Pokhrel, Corrie Mckusick, MD    Allergies    Patient has no known allergies.  Review of Systems   Review of Systems  Unable to perform ROS: Dementia   Physical Exam Updated Vital Signs BP (!) 183/97 (BP  Location: Left Arm)   Pulse (!) 105   Temp 98 F (36.7 C) (Oral)   Resp 14   SpO2 99%   Physical Exam Vitals and nursing note reviewed. Exam conducted with a chaperone present (Female RN present as chaperone).  Constitutional:      General: She is not in acute distress.    Appearance: She is not ill-appearing, toxic-appearing or diaphoretic.  HENT:     Head: Normocephalic and atraumatic.  Eyes:     General: No scleral icterus.       Right eye: No discharge.        Left eye: No discharge.  Cardiovascular:     Rate and Rhythm: Normal rate.  Pulmonary:     Effort: Pulmonary effort is normal. No tachypnea, bradypnea or  respiratory distress.     Breath sounds: Normal breath sounds. No stridor.  Abdominal:     General: Abdomen is flat. There is no distension. There are no signs of injury.     Palpations: There is no mass or pulsatile mass.     Tenderness: There is abdominal tenderness in the left lower quadrant. There is no guarding or rebound.     Hernia: There is no hernia in the umbilical area or ventral area.  Genitourinary:    Rectum: No mass, tenderness, anal fissure, external hemorrhoid or internal hemorrhoid. Normal anal tone.     Comments: No anal fissures or external hemorrhoids noted.  No tenderness or erythema surrounding rectum.  Light brown stool noted in rectal vault without frank red blood or melena.  Small abrasion noted to patient's sacrum, no surrounding erythema or warmth.  No purulent discharge. Skin:    General: Skin is warm and dry.  Neurological:     General: No focal deficit present.     Mental Status: She is alert.     GCS: GCS eye subscore is 4. GCS verbal subscore is 5. GCS motor subscore is 6.     Comments: Alert to person and place  Psychiatric:        Behavior: Behavior is cooperative.    ED Results / Procedures / Treatments   Labs (all labs ordered are listed, but only abnormal results are displayed) Labs Reviewed  COMPREHENSIVE METABOLIC PANEL - Abnormal; Notable for the following components:      Result Value   Sodium 133 (*)    CO2 16 (*)    Glucose, Bld 158 (*)    BUN 25 (*)    Creatinine, Ser 1.36 (*)    Calcium 8.5 (*)    Total Protein 8.5 (*)    Albumin 3.2 (*)    Alkaline Phosphatase 566 (*)    GFR, Estimated 40 (*)    All other components within normal limits  CBC - Abnormal; Notable for the following components:   WBC 18.6 (*)    RBC 3.74 (*)    Hemoglobin 10.4 (*)    HCT 33.8 (*)    Platelets 477 (*)    All other components within normal limits  URINALYSIS, ROUTINE W REFLEX MICROSCOPIC - Abnormal; Notable for the following components:    APPearance TURBID (*)    Hgb urine dipstick MODERATE (*)    Protein, ur 100 (*)    Nitrite POSITIVE (*)    Leukocytes,Ua LARGE (*)    WBC, UA >50 (*)    All other components within normal limits  RESP PANEL BY RT-PCR (FLU A&B, COVID) ARPGX2  URINE CULTURE  CULTURE, BLOOD (SINGLE)  LACTIC ACID, PLASMA  LACTIC ACID, PLASMA  PROTIME-INR  APTT  POC OCCULT BLOOD, ED  TYPE AND SCREEN    EKG EKG Interpretation  Date/Time:  Friday September 28 2021 19:51:05 EDT Ventricular Rate:  85 PR Interval:  122 QRS Duration: 99 QT Interval:  380 QTC Calculation: 452 R Axis:     Text Interpretation: EASI Derived Leads Normal sinus rhythm Artifact Confirmed by Fredia Sorrow [phone]) on 09/28/2021 7:54:42 PM  Radiology CT ABDOMEN PELVIS W CONTRAST  Result Date: 09/28/2021 CLINICAL DATA:  Diverticulitis suspected. EXAM: CT ABDOMEN AND PELVIS WITH CONTRAST TECHNIQUE: Multidetector CT imaging of the abdomen and pelvis was performed using the standard protocol following bolus administration of intravenous contrast. CONTRAST:  64m OMNIPAQUE IOHEXOL 350 MG/ML SOLN COMPARISON:  CT abdomen pelvis 05/03/2020 FINDINGS: Lower chest: Passive atelectasis of the right lower lobe. Otherwise no acute abnormality. 2 mm pulmonary micronodule within the right middle lobe. Hepatobiliary: No focal liver abnormality. Status post cholecystectomy. No biliary dilatation. Pancreas: No focal lesion. Normal pancreatic contour. No surrounding inflammatory changes. No main pancreatic ductal dilatation. Spleen: Normal in size without focal abnormality. A splenule is noted. Adrenals/Urinary Tract: No adrenal nodule bilaterally. Bilateral kidneys enhance symmetrically. No hydronephrosis. No hydroureter. Bilateral severe hydroureteronephrosis. Irregular urinary bladder wall thickening with associated diverticula. On delayed imaging, trace excretion of intravenous contrast starting within bilateral kidneys. Stomach/Bowel: Stomach is  within normal limits. No evidence of bowel wall thickening or dilatation. Few scattered colonic diverticula. Appendix appears normal. Vascular/Lymphatic: No abdominal aorta or iliac aneurysm. Severe atherosclerotic plaque of the aorta and its branches. No abdominal, pelvic, or inguinal lymphadenopathy. Reproductive: Status post hysterectomy. No adnexal masses. Other: No intraperitoneal free fluid. No intraperitoneal free gas. No organized fluid collection. Musculoskeletal: No abdominal wall hernia or abnormality. No suspicious lytic or blastic osseous lesions. No acute displaced fracture. Multilevel degenerative changes of the spine. Grade 1 anterolisthesis of L4 on L5. IMPRESSION: 1. Persistent circumferential irregular urinary bladder wall thickening with suggestion of associated diverticula. Interval development of bilateral severe hydroureteronephrosis. These findings reflect chronic changes of obstructive uropathy or reflux. Underlying malignancy not excluded. Recommend urologic consultation. 2.  Aortic Atherosclerosis (ICD10-I70.0). Electronically Signed   By: MIven FinnM.D.   On: 09/28/2021 20:28   DG Chest Port 1 View  Result Date: 09/28/2021 CLINICAL DATA:  Dark red bloody stools. EXAM: PORTABLE CHEST 1 VIEW COMPARISON:  August 28, 2021 FINDINGS: The heart size and mediastinal contours are within normal limits. Both lungs are clear. An intact right shoulder replacement is noted. IMPRESSION: No active disease. Electronically Signed   By: TVirgina NorfolkM.D.   On: 09/28/2021 19:36    Procedures Procedures   Medications Ordered in ED Medications  cefTRIAXone (ROCEPHIN) 2 g in sodium chloride 0.9 % 100 mL IVPB (0 g Intravenous Stopped 09/28/21 1934)  sodium chloride 0.9 % bolus 1,000 mL (0 mLs Intravenous Stopped 09/28/21 2342)  iohexol (OMNIPAQUE) 350 MG/ML injection 60 mL (60 mLs Intravenous Contrast Given 09/28/21 2009)  morphine 2 MG/ML injection 2 mg (2 mg Intravenous Given  09/28/21 2016)    ED Course  I have reviewed the triage vital signs and the nursing notes.  Pertinent labs & imaging results that were available during my care of the patient were reviewed by me and considered in my medical decision making (see chart for details).  Clinical Course as of 09/29/21 0009  FLudwig ClarksOct 21, 2022  2146 Spoke to urologist Dr. BAlinda Moneywho advised to have a Foley catheter placed,  treat urinary tract infection, and follow-up with urology in outpatient setting. [PB]    Clinical Course User Index [PB] Dyann Ruddle   MDM Rules/Calculators/A&P                           Alert 75 year old female no acute stress, nontoxic-appearing.  Patient is alert to person place only.  Patient's daughter at bedside reports that patient has history of dementia and is at her baseline.  Patient brought to emergency department for generalized weakness, fatigue, dark-colored stool, frequent urination, and abdominal pain.  Due to dementia level 5 caveat applies.  Patient's initial tachycardia on arrival and concern for possible urinary tract infection will initiate ED evolving sepsis work-up at this time.  Urinalysis shows WBC greater than 50, leukocyte large, nitrite positive, RBC 11-20.  During 19.  Findings concerning for urinary tract infection in setting of urinary frequency.  We will start patient on ceftriaxone.  CBC shows leukocytosis at 18.6.  Slight anemia however appears baseline for patient. Lactic acid within normal limits.   Patient does not meet criteria for sepsis at this time.  As afebrile, no tachycardia, no increased respiratory rate.  Low suspicion for GI bleed as no frank red blood or melena seen on rectal exam.  Hemoccult negative.  Hemoglobin at patient's baseline  CMP shows increased creatinine at 1.36, this appears slightly above patient's baseline.  BUN is also elevated.  We will give patient 1 L fluid bolus for likely dehydration.  CMP also shows  increased alk phos at 566, previous lab testing for 476 3 weeks prior, all other liver functions within normal limits.  CT abdomen pelvis was obtained due to patient's left lower quadrant abdominal tenderness.  Findings show Persistent circumferential irregular urinary bladder wall thickening with suggestion of associated diverticula. Interval development of bilateral severe hydroureteronephrosis. These findings reflect chronic changes of obstructive uropathy or reflux. Underlying malignancy not excluded. Recommend urologic consultation.  Spoke to urologist Dr. Alinda Money who advised inserting Foley catheter, treating for urinary tract infection, and having patient follow-up with urology in outpatient setting.  Will discharge patient with 10-day course of Keflex.  Patient to follow-up with [facility] in outpatient setting.  Discussed results, findings, treatment and follow up. Patient's daughter advised of return precautions. Patient's daughter verbalized understanding and agreed with plan.  Patient care discussed with attending physician Dr. Rogene Houston.  Final Clinical Impression(s) / ED Diagnoses Final diagnoses:  Urinary tract infection without hematuria, site unspecified  Urinary retention    Rx / DC Orders ED Discharge Orders          Ordered    cephALEXin (KEFLEX) 500 MG capsule  4 times daily        09/28/21 2208             Loni Beckwith, PA-C 09/29/21 0015    Fredia Sorrow, MD 10/03/21 [phone]

## 2021-10-02 LAB — URINE CULTURE: Culture: >=100000 — AB

## 2021-10-03 ENCOUNTER — Encounter (HOSPITAL_BASED_OUTPATIENT_CLINIC_OR_DEPARTMENT_OTHER): Payer: Self-pay | Admitting: Family

## 2021-10-03 ENCOUNTER — Telehealth (HOSPITAL_BASED_OUTPATIENT_CLINIC_OR_DEPARTMENT_OTHER): Payer: Self-pay | Admitting: Emergency Medicine

## 2021-10-03 ENCOUNTER — Encounter (HOSPITAL_BASED_OUTPATIENT_CLINIC_OR_DEPARTMENT_OTHER): Payer: Self-pay | Admitting: Internal Medicine

## 2021-10-03 ENCOUNTER — Ambulatory Visit (HOSPITAL_BASED_OUTPATIENT_CLINIC_OR_DEPARTMENT_OTHER): Payer: Medicare Other | Admitting: Family

## 2021-10-03 ENCOUNTER — Other Ambulatory Visit: Payer: Self-pay

## 2021-10-03 VITALS — BP 122/66 | HR 96 | Ht 62.0 in | Wt 128.6 lb

## 2021-10-03 DIAGNOSIS — I252 Old myocardial infarction: Secondary | ICD-10-CM | POA: Diagnosis not present

## 2021-10-03 DIAGNOSIS — E1165 Type 2 diabetes mellitus with hyperglycemia: Secondary | ICD-10-CM

## 2021-10-03 DIAGNOSIS — E785 Hyperlipidemia, unspecified: Secondary | ICD-10-CM | POA: Diagnosis not present

## 2021-10-03 DIAGNOSIS — I10 Essential (primary) hypertension: Secondary | ICD-10-CM | POA: Diagnosis not present

## 2021-10-03 DIAGNOSIS — Z8673 Personal history of transient ischemic attack (TIA), and cerebral infarction without residual deficits: Secondary | ICD-10-CM | POA: Diagnosis not present

## 2021-10-03 DIAGNOSIS — Z794 Long term (current) use of insulin: Secondary | ICD-10-CM

## 2021-10-03 LAB — CULTURE, BLOOD (SINGLE)
Culture: NO GROWTH
Special Requests: ADEQUATE

## 2021-10-03 NOTE — Patient Instructions (Addendum)
Medication Instructions:  Continue your current medications.   *If you need a refill on your cardiac medications before your next appointment, please call your pharmacy*  Lab Work: None ordered today.   Testing/Procedures: Your physician has requested that you have a lexiscan myoview.  Please follow instruction sheet, as given.  Follow-Up: At [facility], you and your health needs are our priority.  As part of our continuing mission to provide you with exceptional heart care, we have created designated Provider Care Teams.  These Care Teams include your primary Cardiologist (physician) and Advanced Practice Providers (APPs -  Physician Assistants and Nurse Practitioners) who all work together to provide you with the care you need, when you need it.  We recommend signing up for the patient portal called "MyChart".  Sign up information is provided on this After Visit Summary.  MyChart is used to connect with patients for Virtual Visits (Telemedicine).  Patients are able to view lab/test results, encounter notes, upcoming appointments, etc.  Non-urgent messages can be sent to your provider as well.   To learn more about what you can do with MyChart, go to [email or web address].    Your next appointment:   2-3 month(s)  The format for your next appointment:   In Person  Provider:   You may see Pixie Casino, MD or one of the following Advanced Practice Providers on your designated Care Team:   Almyra Deforest, PA-C Fabian Sharp, PA-C  [place], [place] Loel Dubonnet, NP   Other Instructions  Heart Healthy Diet Recommendations: A low-salt diet is recommended. Meats should be grilled, baked, or boiled. Avoid fried foods. Focus on lean protein sources like fish or chicken with vegetables and fruits. The American Heart Association is a Microbiologist!  American Heart Association Diet and Lifeystyle Recommendations    Exercise recommendations: The American Heart Association recommends  150 minutes of moderate intensity exercise weekly. Try 30 minutes of moderate intensity exercise 4-5 times per week. This could include walking, jogging, or swimming.    You are scheduled for a Myocardial Perfusion Imaging Study on: _______________.  Please arrive 15 minutes prior to your appointment time for registration and insurance purposes.  The test will take approximately 3 to 4 hours to complete; you may bring reading material.  If someone comes with you to your appointment, they will need to remain in the main lobby due to limited space in the testing area.  How to prepare for your Myocardial Perfusion Test: Do not eat or drink 3 hours prior to your test, except you may have water. Do not consume products containing caffeine (regular or decaffeinated) 12 hours prior to your test. (ex: coffee, chocolate, sodas, tea). Do bring a list of your current medications with you.  If not listed below, you may take your medications as normal. Do not take metoprolol (Lopressor, Toprol) for 24 hours prior to the test.  Bring the medication to your appointment as you may be required to take it once the test is complete. Do wear comfortable clothes (no dresses or overalls) and walking shoes, tennis shoes preferred (No heels or open toe shoes are allowed). Do NOT wear cologne, perfume, aftershave, or lotions (deodorant is allowed). If these instructions are not followed, your test will have to be rescheduled.  Please report to [address], [address] for your test.  If you have questions or concerns about your appointment, you can call the Nuclear Lab at [phone].  If you cannot keep  your appointment, please provide 24 hours notification to the Nuclear Lab, to avoid a possible $50 charge to your account.

## 2021-10-03 NOTE — Telephone Encounter (Signed)
Post ED Visit - Positive Culture Follow-up: Successful Patient Follow-Up  Culture assessed and recommendations reviewed by:  []  Elenor Quinones, Pharm.D. []  Heide Guile, Pharm.D., BCPS AQ-ID []  Parks Neptune, Pharm.D., BCPS []  Alycia Rossetti, Pharm.D., BCPS []  [place], [place].D., BCPS, AAHIVP []  Legrand Como, Pharm.D., BCPS, AAHIVP []  Salome Arnt, PharmD, BCPS []  Johnnette Gourd, PharmD, BCPS []  Hughes Better, PharmD, BCPS [x]  Reuel Boom, PharmD  Positive urine culture  []  Patient discharged without antimicrobial prescription and treatment is now indicated [x]  Organism is resistant to prescribed ED discharge antimicrobial []  Patient with positive blood cultures  Changes discussed with ED provider: Sherrell Puller, PA New antibiotic prescription Ciprofloxacin 500 mg every 24 hours for five days, d/c Cephalexin Called to [facility]) [phone]  Contacted patient/daughter, date 10/03/2021, time Ashley 10/03/2021, 2:14 PM

## 2021-10-03 NOTE — Progress Notes (Signed)
Office Visit    Patient Name: Sonya Duffy Date of Encounter: 10/03/2021  PCP:  Elwyn Reach, MD   [place]  Cardiologist:  Pixie Casino, MD  Advanced Practice Provider:  No care team member to display Electrophysiologist:  None      Chief Complaint    [name] is a 75 y.o. female with a hx of DM2, CVA, NSTEMI, HTN, HLD presents today for hospital follow up   Past Medical History    Past Medical History:  Diagnosis Date   Diabetes mellitus    Hypertension    Past Surgical History:  Procedure Laterality Date   CHOLECYSTECTOMY      Allergies  No Known Allergies  History of Present Illness    Sonya Duffy is a 75 y.o. female with a hx of DM2, CVA, NSTEMI, HTN, HLD last seen while hospitalized.  Admitted 08/2021 with CVA (MRi with left pontine infarct), NSTEMI with subsequent echo showing normal LVEF and apical akinesis, MSSA bacteremia, diabetic ketoacidosis with A1c 13.3. Cardiology consulted and recommended for outpatient ischemic evaluation. ED visit 09/28/20 with UTI. Foley placed and recommended for outpatient follow up with urology and abx.   She presents today for follow up with her daughter. Reports no shortness of breath at rest and mild dyspnea on exertion. She is working with therapy at home and improving strength. Reports no chest pain, pressure, or tightness. No edema, orthopnea, PND. Reports no palpitations. We reviewed hospital testing in detail and indication for ischemic evaluation.   EKGs/Labs/Other Studies Reviewed:   The following studies were reviewed today: Limited echo 08/30/21: 1. Limited study with definity to R/O apical thrombus; apical akinesis  with overall low normal LV function; no thrombus noted.   2. Left ventricular ejection fraction, by estimation, is 50 to 55%. The  left ventricle has low normal function. The left ventricle demonstrates  regional wall motion abnormalities (see scoring  diagram/findings for  description).   3. Right ventricular systolic function is normal. The right ventricular  size is normal.      Echo 08/29/21:  1. Left ventricular ejection fraction, by estimation, is 55 to 60%. The  left ventricle has normal function. The left ventricle demonstrates  regional wall motion abnormalities (see scoring diagram/findings for  description). There is moderate asymmetric  left ventricular hypertrophy of the basal-septal segment. Indeterminate  diastolic filling due to E-A fusion.   2. Right ventricular systolic function is normal. The right ventricular  size is normal. Tricuspid regurgitation signal is inadequate for assessing  PA pressure.   3. The mitral valve is normal in structure. Trivial mitral valve  regurgitation. No evidence of mitral stenosis.   4. The aortic valve is grossly normal. Aortic valve regurgitation is not  visualized. No aortic stenosis is present.   5. The inferior vena cava is normal in size with greater than 50%  respiratory variability, suggesting right atrial pressure of 3 mmHg.     EKG:  No EKG today.   Recent Labs: 08/28/2021: TSH 1.372 09/02/2021: Magnesium 1.8 09/28/2021: ALT 22; BUN 25; Creatinine, Ser 1.36; Hemoglobin 10.4; Platelets 477; Potassium 4.8; Sodium 133  Recent Lipid Panel    Component Value Date/Time   CHOL 134 08/28/2021 0815   TRIG 125 08/28/2021 0815   HDL 19 (L) 08/28/2021 0815   CHOLHDL 7.1 08/28/2021 0815   VLDL 25 08/28/2021 0815   LDLCALC 90 08/28/2021 0815    Home Medications   Current  Meds  Medication Sig   aspirin EC 81 MG tablet Take 1 tablet (81 mg total) by mouth daily. Swallow whole.   atorvastatin (LIPITOR) 40 MG tablet Take 1 tablet (40 mg total) by mouth daily.   cephALEXin (KEFLEX) 500 MG capsule Take 1 capsule (500 mg total) by mouth 4 (four) times daily for 10 days.   clopidogrel (PLAVIX) 75 MG tablet Take 75 mg by mouth daily.   clotrimazole (LOTRIMIN) 1 % cream Apply  topically 2 (two) times daily. Apply on the buttocks area too   conjugated estrogens (PREMARIN) vaginal cream Place vaginally at bedtime.   diphenoxylate-atropine (LOMOTIL) 2.5-0.025 MG tablet Take 1 tablet by mouth daily.   FLUoxetine (PROZAC) 20 MG capsule Take 20 mg by mouth daily.   insulin glargine (LANTUS) 100 UNIT/ML injection Inject 0.25 mLs (25 Units total) into the skin at bedtime.   losartan (COZAAR) 50 MG tablet Take 50 mg by mouth daily.   metFORMIN (GLUCOPHAGE) 1000 MG tablet Take 1,000 mg by mouth 2 (two) times daily.   metoprolol tartrate (LOPRESSOR) 25 MG tablet Take 1 tablet (25 mg total) by mouth 2 (two) times daily.   omeprazole (PRILOSEC) 40 MG capsule Take 40 mg by mouth daily.   pantoprazole (PROTONIX) 40 MG tablet Take 1 tablet (40 mg total) by mouth daily.   traMADol (ULTRAM) 50 MG tablet Take 50 mg by mouth every 6 (six) hours as needed for moderate pain.     Review of Systems      All other systems reviewed and are otherwise negative except as noted above.  Physical Exam    VS:  There were no vitals taken for this visit. , BMI There is no height or weight on file to calculate BMI.  Wt Readings from Last 3 Encounters:  09/17/21 132 lb 12.8 oz (60.2 kg)  09/03/21 157 lb 6.5 oz (71.4 kg)     GEN: Frail appearing, well developed, in no acute distress. HEENT: normal. Neck: Supple, no JVD, carotid bruits, or masses. Cardiac: RRR, no murmurs, rubs, or gallops. No clubbing, cyanosis, edema.  Radials/PT 2+ and equal bilaterally.  Respiratory:  Respirations regular and unlabored, clear to auscultation bilaterally. GI: Soft, nontender, nondistended. Foley catheter in place. MS: No deformity or atrophy. Skin: Warm and dry, no rash. Neuro:  Strength and sensation are intact. Psych: Normal affect.  Assessment & Plan    MSSA bacteremia - Recent admission. Followed by ID. Treated with abx. Per ID note 09/02/21 by Dr. Gale Journey plan to treat with 2 week abx and close  monitoring rather than subjecting to TEE.  History of CVA - Plan per neurology for DAPT x 3 months then aspirin alone. Continue statin. Optimal BP and A1c control encouraged. Upcoming neurology appt.   NSTEMI - In setting of CVA, MSSA bacteremia 08/2021. Echo preserved LVEF and apical akinesis with no troponin. Troponin peak 08/28/21. EKg during admission with precordial T inversions. Plan for lexiscan myoview. Avoid CTA due to recent AKI.   Shared Decision Making/Informed Consent The risks [chest pain, shortness of breath, cardiac arrhythmias, dizziness, blood pressure fluctuations, myocardial infarction, stroke/transient ischemic attack, nausea, vomiting, allergic reaction, radiation exposure, metallic taste sensation and life-threatening complications (estimated to be 1 in 10,000)], benefits (risk stratification, diagnosing coronary artery disease, treatment guidance) and alternatives of a nuclear stress test were discussed in detail with Sonya Duffy and she agrees to proceed.   HLD, LDL goal <70 - Continue Atorvastatin 40mg  QD. 08/28/21 LDL 90. Plan to repeat at  follow up. LDL goal <70 in setting of previous CVA. Further up titration or Zetia could be considered.   HTN - BP well controlled. Continue current antihypertensive regimen.    DM2 - Continue to follow with PCP. 08/28/21 A1c 13.3 in setting of noncompiance. Reports sugars have been better controlled. Daughter is assisting to manage medications.   CKD - AKI during recent admission. Careful titration of diuretic and antihypertensive.    Disposition: Follow up in 2-3 month(s) with Dr. Debara Pickett  or APP.  Signed, Loel Dubonnet, NP 10/03/2021, 11:57 AM [place]

## 2021-10-03 NOTE — Progress Notes (Signed)
ED Antimicrobial Stewardship Positive Culture Follow Up   Sonya Duffy is an 75 y.o. female who presented to [facility] on 09/28/2021 with a chief complaint of  Chief Complaint  Patient presents with   GI Bleeding    Recent Results (from the past 720 hour(s))  Urine Culture     Status: Abnormal   Collection Time: 09/28/21  6:47 PM   Specimen: Urine, Clean Catch  Result Value Ref Range Status   Specimen Description   Final    URINE, CLEAN CATCH Performed at [facility] [address], [place], [place] [address]    Special Requests   Final    NONE Performed at [facility], [place] [address] [place], [place] [address]    Culture >=100,000 COLONIES/mL ENTEROBACTER CLOACAE (A)  Final   Report Status 10/02/2021 FINAL  Final   Organism ID, Bacteria ENTEROBACTER CLOACAE (A)  Final      Susceptibility   Enterobacter cloacae - MIC*    CEFAZOLIN >=64 RESISTANT Resistant     CEFEPIME 16 RESISTANT Resistant     CIPROFLOXACIN <=0.25 SENSITIVE Sensitive     GENTAMICIN <=1 SENSITIVE Sensitive     IMIPENEM 0.5 SENSITIVE Sensitive     NITROFURANTOIN 64 INTERMEDIATE Intermediate     TRIMETH/SULFA <=20 SENSITIVE Sensitive     PIP/TAZO >=128 RESISTANT Resistant     * >=100,000 COLONIES/mL ENTEROBACTER CLOACAE  Resp Panel by RT-PCR (Flu A&B, Covid) Nasopharyngeal Swab     Status: None   Collection Time: 09/28/21  6:58 PM   Specimen: Nasopharyngeal Swab; Nasopharyngeal(NP) swabs in vial transport medium  Result Value Ref Range Status   SARS Coronavirus 2 by RT PCR NEGATIVE NEGATIVE Final    Comment: (NOTE) SARS-CoV-2 target nucleic acids are NOT DETECTED.  The SARS-CoV-2 RNA is generally detectable in upper respiratory specimens during the acute phase of infection. The lowest concentration of SARS-CoV-2 viral copies this assay can detect is 138 copies/mL. A negative result does not preclude SARS-Cov-2 infection and should not be used as the sole  basis for treatment or other patient management decisions. A negative result may occur with  improper specimen collection/handling, submission of specimen other than nasopharyngeal swab, presence of viral mutation(s) within the areas targeted by this assay, and inadequate number of viral copies(<138 copies/mL). A negative result must be combined with clinical observations, patient history, and epidemiological information. The expected result is Negative.  Fact Sheet for Patients:  EntrepreneurPulse.com.au  Fact Sheet for Healthcare Providers:  IncredibleEmployment.be  This test is no t yet approved or cleared by the [place] FDA and  has been authorized for detection and/or diagnosis of SARS-CoV-2 by FDA under an Emergency Use Authorization (EUA). This EUA will remain  in effect (meaning this test can be used) for the duration of the COVID-19 declaration under Section 564(b)(1) of the Act, 21 U.S.C.section 360bbb-3(b)(1), unless the authorization is terminated  or revoked sooner.       Influenza A by PCR NEGATIVE NEGATIVE Final   Influenza B by PCR NEGATIVE NEGATIVE Final    Comment: (NOTE) The Xpert Xpress SARS-CoV-2/FLU/RSV plus assay is intended as an aid in the diagnosis of influenza from Nasopharyngeal swab specimens and should not be used as a sole basis for treatment. Nasal washings and aspirates are unacceptable for Xpert Xpress SARS-CoV-2/FLU/RSV testing.  Fact Sheet for Patients: EntrepreneurPulse.com.au  Fact Sheet for Healthcare Providers: IncredibleEmployment.be  This test is not yet approved or cleared by the [place] FDA  and has been authorized for detection and/or diagnosis of SARS-CoV-2 by FDA under an Emergency Use Authorization (EUA). This EUA will remain in effect (meaning this test can be used) for the duration of the COVID-19 declaration under Section 564(b)(1) of the Act,  21 U.S.C. section 360bbb-3(b)(1), unless the authorization is terminated or revoked.  Performed at [facility], [place] [address] [place], [place] [address]   Blood culture (routine single)     Status: None   Collection Time: 09/28/21  7:01 PM   Specimen: BLOOD  Result Value Ref Range Status   Specimen Description   Final    BLOOD RIGHT ANTECUBITAL Performed at [facility] [address] [place], [place] [address]    Special Requests   Final    BOTTLES DRAWN AEROBIC AND ANAEROBIC Blood Culture adequate volume Performed at [facility] [address] [place], [place] [address]    Culture   Final    NO GROWTH 5 DAYS Performed at [facility], [place] [address], [place], [place] [address]    Report Status 10/03/2021 FINAL  Final   48 YOF w/ hx of dementia and chronic UTIs presenting w/ generalized weakness, fatigue, dark colored urine, and increased urinary frequency.  [x]  Treated with cephalexin 500 mg 1 capsule QID for 10 days, organism resistant to prescribed antimicrobial  Plan: 1) Discontinue cephalexin 2) Start ciprofloxacin 500 mg Q24H for 5 days Pt is f/u w/ Dr. Alinda Money, Urology  ED Provider: Sherrell Puller, PA-C  Kristopher Oppenheim, [place]  10/03/2021, 12:27 PM

## 2021-10-09 ENCOUNTER — Encounter: Payer: Self-pay | Admitting: Adult Health

## 2021-10-09 ENCOUNTER — Inpatient Hospital Stay: Payer: Self-pay | Admitting: Adult Health

## 2021-10-12 ENCOUNTER — Telehealth (HOSPITAL_COMMUNITY): Payer: Self-pay | Admitting: *Deleted

## 2021-10-12 NOTE — Telephone Encounter (Signed)
Spoke with patient's daughter and she states she has the instructions for stress test on Friday 10/19/21 and understands.

## 2021-10-15 ENCOUNTER — Emergency Department (HOSPITAL_COMMUNITY): Payer: Medicare Other

## 2021-10-15 ENCOUNTER — Inpatient Hospital Stay (HOSPITAL_COMMUNITY)
Admission: EM | Admit: 2021-10-15 | Discharge: 2021-10-19 | DRG: 064 | Disposition: A | Discharge status: Skilled Nursing Facility | Payer: Medicare Other | Attending: Internal Medicine | Admitting: Internal Medicine

## 2021-10-15 ENCOUNTER — Other Ambulatory Visit: Payer: Self-pay

## 2021-10-15 ENCOUNTER — Encounter (HOSPITAL_COMMUNITY): Payer: Self-pay | Admitting: *Deleted

## 2021-10-15 DIAGNOSIS — R4781 Slurred speech: Secondary | ICD-10-CM | POA: Diagnosis present

## 2021-10-15 DIAGNOSIS — R339 Retention of urine, unspecified: Secondary | ICD-10-CM | POA: Diagnosis present

## 2021-10-15 DIAGNOSIS — Z794 Long term (current) use of insulin: Secondary | ICD-10-CM | POA: Diagnosis not present

## 2021-10-15 DIAGNOSIS — E785 Hyperlipidemia, unspecified: Secondary | ICD-10-CM | POA: Diagnosis present

## 2021-10-15 DIAGNOSIS — Z8249 Family history of ischemic heart disease and other diseases of the circulatory system: Secondary | ICD-10-CM

## 2021-10-15 DIAGNOSIS — Z7902 Long term (current) use of antithrombotics/antiplatelets: Secondary | ICD-10-CM | POA: Diagnosis not present

## 2021-10-15 DIAGNOSIS — Z7984 Long term (current) use of oral hypoglycemic drugs: Secondary | ICD-10-CM

## 2021-10-15 DIAGNOSIS — Z8673 Personal history of transient ischemic attack (TIA), and cerebral infarction without residual deficits: Secondary | ICD-10-CM

## 2021-10-15 DIAGNOSIS — R29810 Facial weakness: Secondary | ICD-10-CM | POA: Diagnosis present

## 2021-10-15 DIAGNOSIS — E782 Mixed hyperlipidemia: Secondary | ICD-10-CM | POA: Diagnosis not present

## 2021-10-15 DIAGNOSIS — L89153 Pressure ulcer of sacral region, stage 3: Secondary | ICD-10-CM | POA: Diagnosis present

## 2021-10-15 DIAGNOSIS — I639 Cerebral infarction, unspecified: Secondary | ICD-10-CM | POA: Diagnosis present

## 2021-10-15 DIAGNOSIS — Z7982 Long term (current) use of aspirin: Secondary | ICD-10-CM | POA: Diagnosis not present

## 2021-10-15 DIAGNOSIS — I252 Old myocardial infarction: Secondary | ICD-10-CM | POA: Diagnosis not present

## 2021-10-15 DIAGNOSIS — N179 Acute kidney failure, unspecified: Secondary | ICD-10-CM | POA: Diagnosis present

## 2021-10-15 DIAGNOSIS — E1165 Type 2 diabetes mellitus with hyperglycemia: Secondary | ICD-10-CM

## 2021-10-15 DIAGNOSIS — I6389 Other cerebral infarction: Secondary | ICD-10-CM | POA: Diagnosis present

## 2021-10-15 DIAGNOSIS — G8311 Monoplegia of lower limb affecting right dominant side: Secondary | ICD-10-CM | POA: Diagnosis present

## 2021-10-15 DIAGNOSIS — I10 Essential (primary) hypertension: Secondary | ICD-10-CM | POA: Diagnosis present

## 2021-10-15 DIAGNOSIS — L8990 Pressure ulcer of unspecified site, unspecified stage: Secondary | ICD-10-CM | POA: Insufficient documentation

## 2021-10-15 DIAGNOSIS — I672 Cerebral atherosclerosis: Secondary | ICD-10-CM | POA: Diagnosis present

## 2021-10-15 DIAGNOSIS — R471 Dysarthria and anarthria: Secondary | ICD-10-CM | POA: Diagnosis present

## 2021-10-15 DIAGNOSIS — E1169 Type 2 diabetes mellitus with other specified complication: Secondary | ICD-10-CM | POA: Diagnosis present

## 2021-10-15 DIAGNOSIS — Z20822 Contact with and (suspected) exposure to covid-19: Secondary | ICD-10-CM | POA: Diagnosis present

## 2021-10-15 DIAGNOSIS — F419 Anxiety disorder, unspecified: Secondary | ICD-10-CM | POA: Diagnosis present

## 2021-10-15 DIAGNOSIS — E538 Deficiency of other specified B group vitamins: Secondary | ICD-10-CM | POA: Diagnosis present

## 2021-10-15 DIAGNOSIS — I2510 Atherosclerotic heart disease of native coronary artery without angina pectoris: Secondary | ICD-10-CM | POA: Diagnosis present

## 2021-10-15 DIAGNOSIS — R262 Difficulty in walking, not elsewhere classified: Secondary | ICD-10-CM | POA: Diagnosis present

## 2021-10-15 DIAGNOSIS — Z79899 Other long term (current) drug therapy: Secondary | ICD-10-CM | POA: Diagnosis not present

## 2021-10-15 LAB — URINALYSIS, ROUTINE W REFLEX MICROSCOPIC
Bilirubin Urine: NEGATIVE
Glucose, UA: NEGATIVE mg/dL
Ketones, ur: NEGATIVE mg/dL
Nitrite: NEGATIVE
Protein, ur: 30 mg/dL — AB
Specific Gravity, Urine: 1.012 (ref 1.005–1.030)
WBC, UA: >50 WBC/hpf — ABNORMAL HIGH (ref 0–5)
pH: 5 (ref 5.0–8.0)

## 2021-10-15 LAB — COMPREHENSIVE METABOLIC PANEL
ALT: 17 U/L (ref 0–44)
AST: 25 U/L (ref 15–41)
Albumin: 3.7 g/dL (ref 3.5–5.0)
Alkaline Phosphatase: 230 U/L — ABNORMAL HIGH (ref 38–126)
Anion gap: 10 (ref 5–15)
BUN: 22 mg/dL (ref 8–23)
CO2: 24 mmol/L (ref 22–32)
Calcium: 8.5 mg/dL — ABNORMAL LOW (ref 8.9–10.3)
Chloride: 100 mmol/L (ref 98–111)
Creatinine, Ser: 0.94 mg/dL (ref 0.44–1.00)
GFR, Estimated: >60 mL/min (ref >60)
Glucose, Bld: 136 mg/dL — ABNORMAL HIGH (ref 70–99)
Potassium: 4.6 mmol/L (ref 3.5–5.1)
Sodium: 134 mmol/L — ABNORMAL LOW (ref 135–145)
Total Bilirubin: 0.6 mg/dL (ref 0.3–1.2)
Total Protein: 8.2 g/dL — ABNORMAL HIGH (ref 6.5–8.1)

## 2021-10-15 LAB — DIFFERENTIAL
Abs Immature Granulocytes: 0.02 10*3/uL (ref 0.00–0.07)
Basophils Absolute: 0 10*3/uL (ref 0.0–0.1)
Basophils Relative: 0 %
Eosinophils Absolute: 0.1 10*3/uL (ref 0.0–0.5)
Eosinophils Relative: 1 %
Immature Granulocytes: 0 %
Lymphocytes Relative: 26 %
Lymphs Abs: 2.2 10*3/uL (ref 0.7–4.0)
Monocytes Absolute: 0.6 10*3/uL (ref 0.1–1.0)
Monocytes Relative: 7 %
Neutro Abs: 5.5 10*3/uL (ref 1.7–7.7)
Neutrophils Relative %: 66 %

## 2021-10-15 LAB — CBC
HCT: 33.8 % — ABNORMAL LOW (ref 36.0–46.0)
Hemoglobin: 10.5 g/dL — ABNORMAL LOW (ref 12.0–15.0)
MCH: 27.6 pg (ref 26.0–34.0)
MCHC: 31.1 g/dL (ref 30.0–36.0)
MCV: 88.7 fL (ref 80.0–100.0)
Platelets: 400 10*3/uL (ref 150–400)
RBC: 3.81 MIL/uL — ABNORMAL LOW (ref 3.87–5.11)
RDW: 16.1 % — ABNORMAL HIGH (ref 11.5–15.5)
WBC: 8.5 10*3/uL (ref 4.0–10.5)
nRBC: 0 % (ref 0.0–0.2)

## 2021-10-15 LAB — RAPID URINE DRUG SCREEN, HOSP PERFORMED
Amphetamines: NOT DETECTED
Barbiturates: NOT DETECTED
Benzodiazepines: NOT DETECTED
Cocaine: NOT DETECTED
Opiates: NOT DETECTED
Tetrahydrocannabinol: NOT DETECTED

## 2021-10-15 LAB — PROTIME-INR
INR: 1 (ref 0.8–1.2)
Prothrombin Time: 12.9 seconds (ref 11.4–15.2)

## 2021-10-15 LAB — RESP PANEL BY RT-PCR (FLU A&B, COVID) ARPGX2
Influenza A by PCR: NEGATIVE
Influenza B by PCR: NEGATIVE
SARS Coronavirus 2 by RT PCR: NEGATIVE

## 2021-10-15 LAB — APTT: aPTT: 30 seconds (ref 24–36)

## 2021-10-15 LAB — ETHANOL: Alcohol, Ethyl (B): <10 mg/dL (ref <10)

## 2021-10-15 LAB — CBG MONITORING, ED: Glucose-Capillary: 106 mg/dL — ABNORMAL HIGH (ref 70–99)

## 2021-10-15 IMAGING — CT CT HEAD W/O CM
3 of 4 series · 15 of 47 positions shown, 18 images · non-contrast
Comparison: [DATE] and brain MR dated [DATE].

CLINICAL DATA: Slurred speech since this morning.

EXAM:
CT HEAD WITHOUT CONTRAST
TECHNIQUE: Contiguous axial images were obtained from the base of the skull
through the vertex without intravenous contrast.

[Series 3: head wo · axial · 0.47mm/px · z∈[-140,-15]mm · 9 of 31 slices shown, 12 images]
[im 3/31  brain]
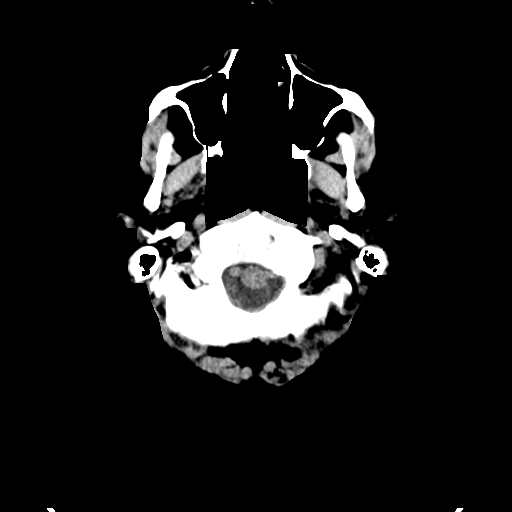
[im 3/31  bone]
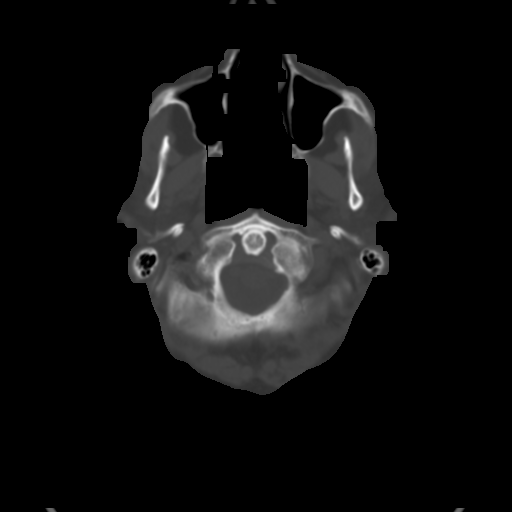
[im 6/31  brain]
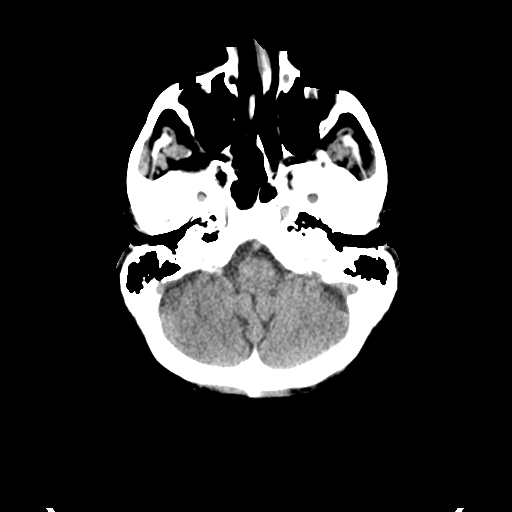
[im 9/31  brain]
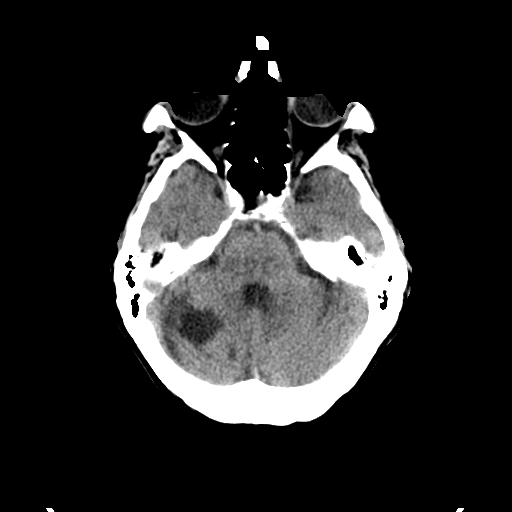
[im 12/31  brain]
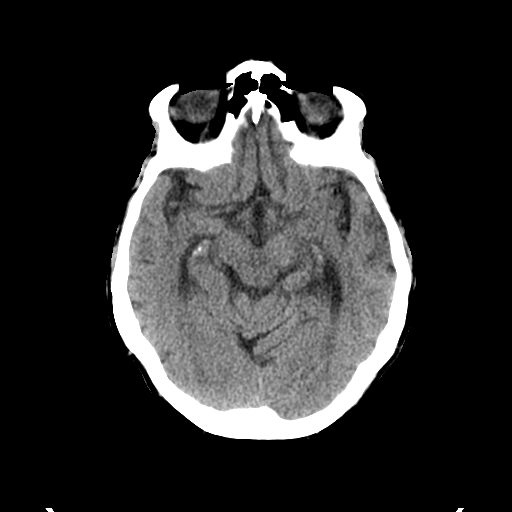
[im 16/31  brain]
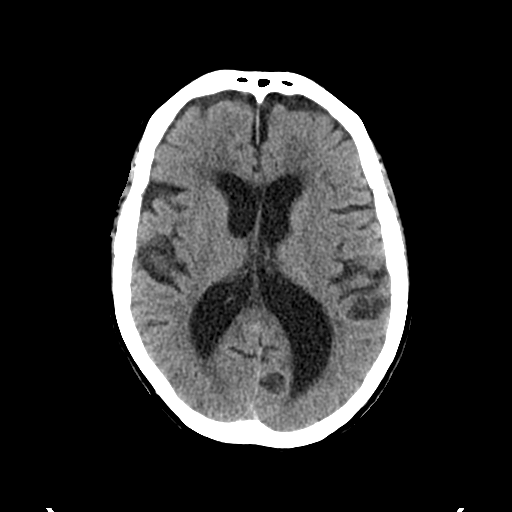
[im 16/31  bone]
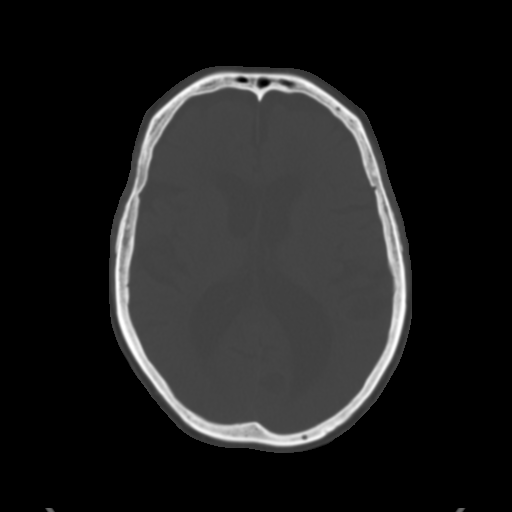
[im 19/31  brain]
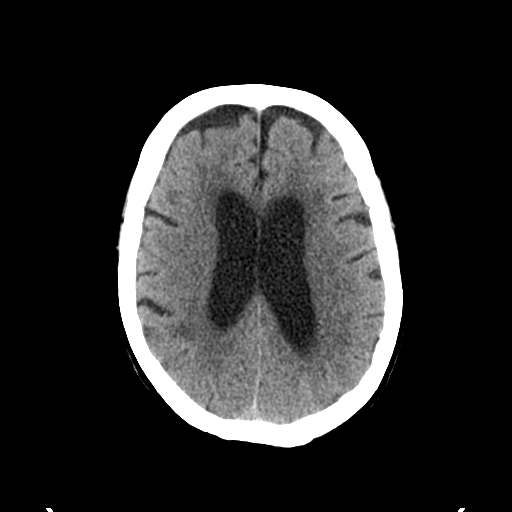
[im 22/31  brain]
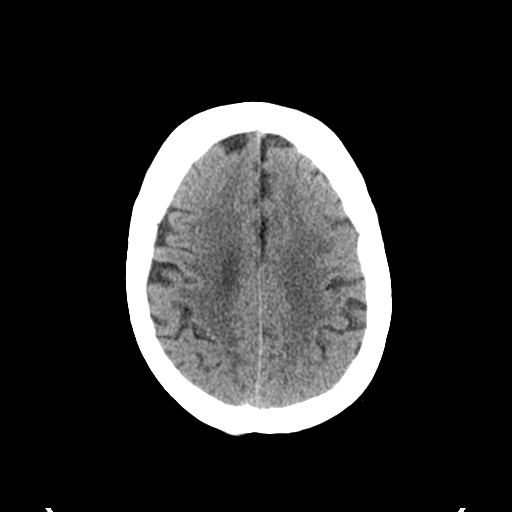
[im 25/31  brain]
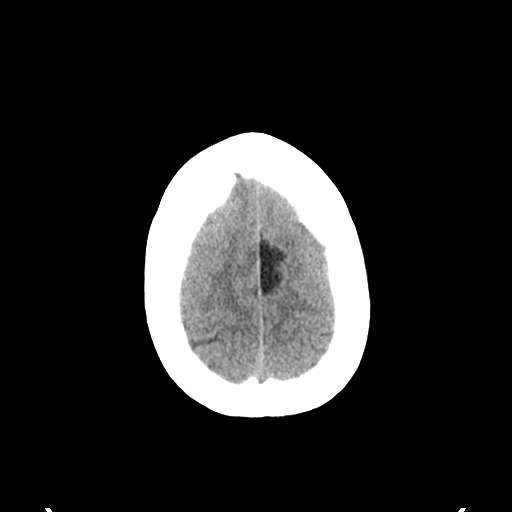
[im 28/31  brain]
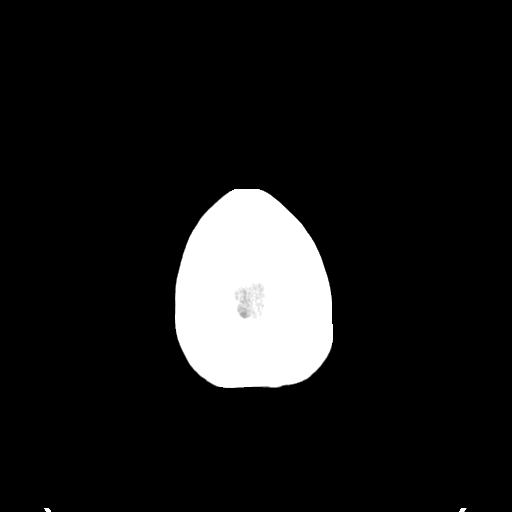
[im 28/31  bone]
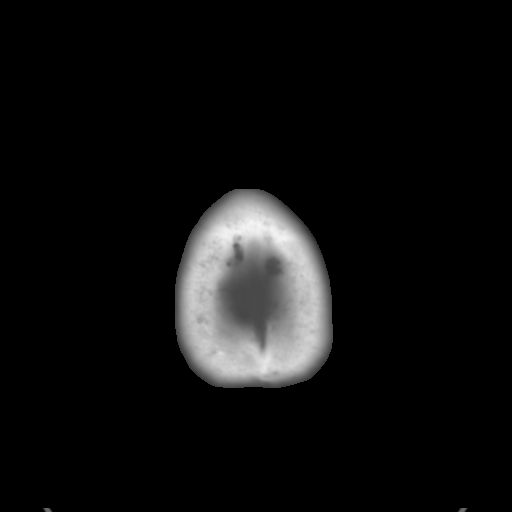

[Series 5: sagittal soft tissue · sagittal · 0.32mm/px · 3 of 60 slices shown]
[im 20/60  brain]
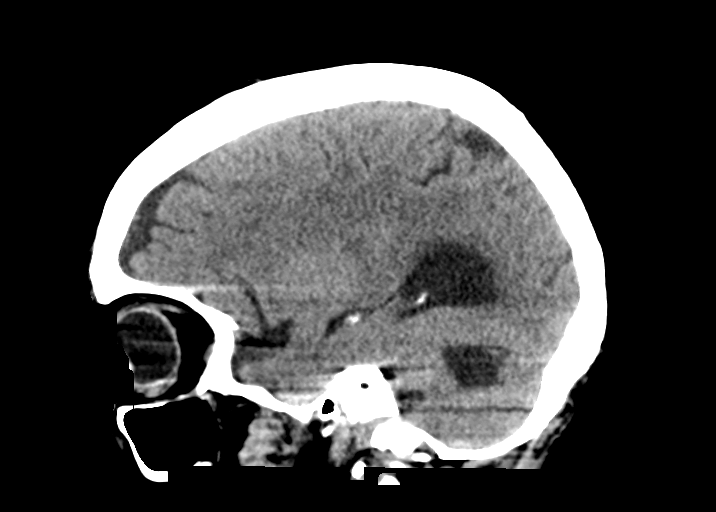
[im 30/60  brain]
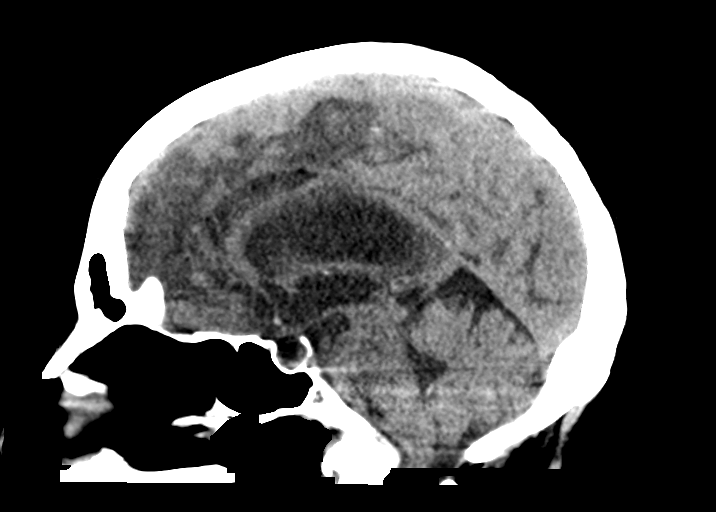
[im 40/60  brain]
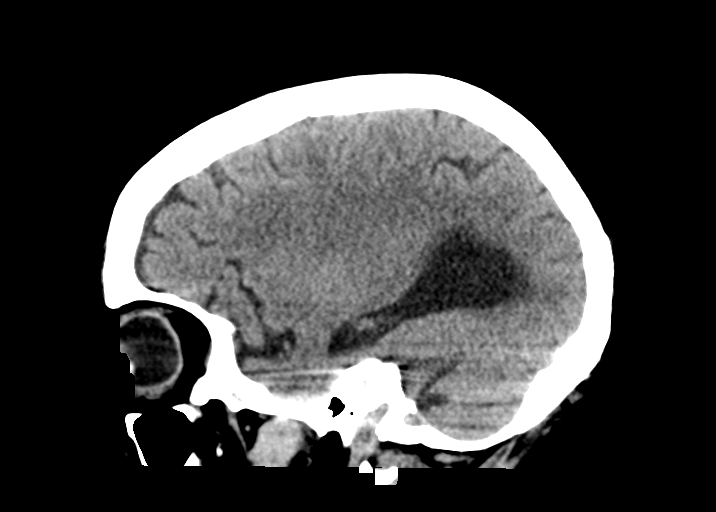

[Series 6: coronal soft tissue · coronal · 0.32mm/px · 3 of 78 slices shown]
[im 20/78  brain]
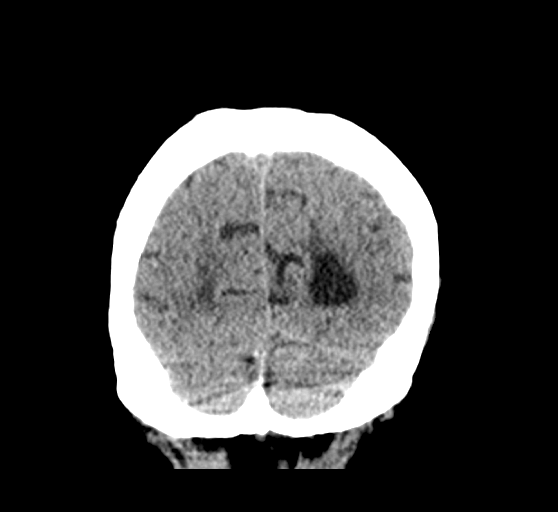
[im 39/78  brain]
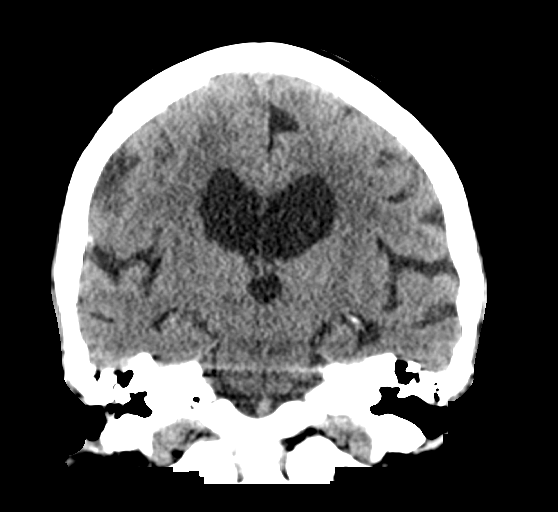
[im 58/78  brain]
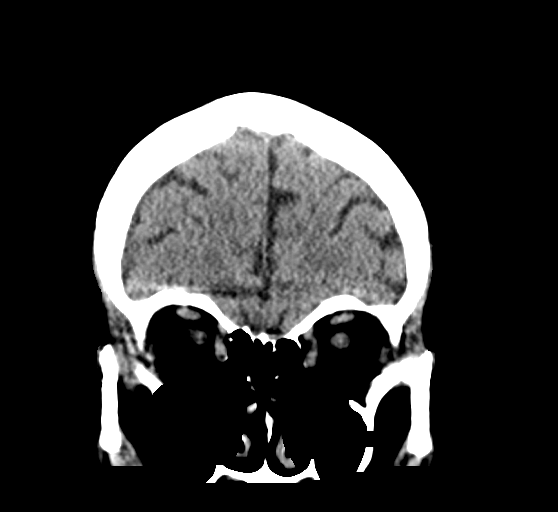

[15 of 47 positions shown; findings below may reference images not displayed]

FINDINGS: Brain: Stable moderately enlarged ventricles and subarachnoid
spaces. Mild-to-moderate patchy white matter low density in both
cerebral hemispheres without significant change. Stable right
lateral partially calcified extra-axial mass compatible with a
meningioma measuring 1.6 cm in maximum diameter on image number
[DATE]. Stable old right cerebellar infarcts with
encephalomalacia/porencephaly. More chronic appearance of the
previously demonstrated left paracentral pontine infarct. No
intracranial hemorrhage or new mass lesion seen. No acute areas of
infarction visualized, limited with CT.

Vascular: No hyperdense vessel or unexpected calcification.

Skull: No hyperdense vessel or unexpected calcification.

Sinuses/Orbits: Unremarkable.

Other: None.
IMPRESSION: 1. No acute abnormality.
2. Stable atrophy, chronic small vessel white matter ischemic
changes, old infarcts and right lateral meningioma without mass
effect.

## 2021-10-15 IMAGING — MR MR HEAD W/O CM
9 of 10 series · 40 of 48 positions shown · non-contrast
Comparison: [DATE] MRI head

CLINICAL DATA: Neuro deficit, stroke suspected

EXAM:
MRI HEAD WITHOUT CONTRAST
TECHNIQUE: Multiplanar, multi-echo pulse sequences of the brain and surrounding
structures were acquired without intravenous contrast.

[Series 9: dwi_tracew · axial · 3.0mm · 1.08mm/px · z∈[-46,+95]mm · 8 of 102 slices shown]
[im 1/102]
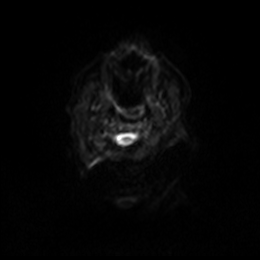
[im 21/102]
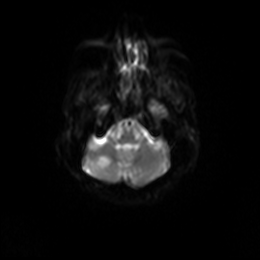
[im 31/102]
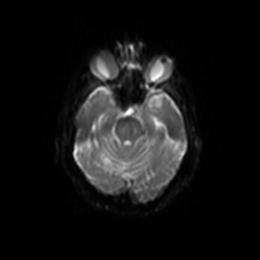
[im 41/102]
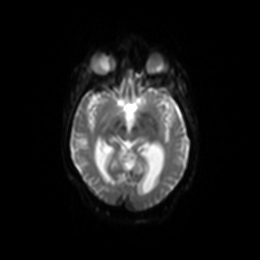
[im 61/102]
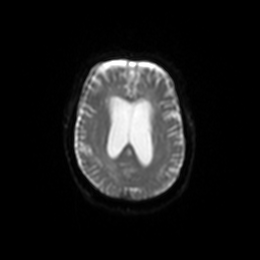
[im 71/102]
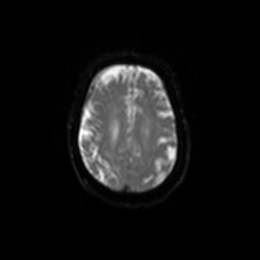
[im 81/102]
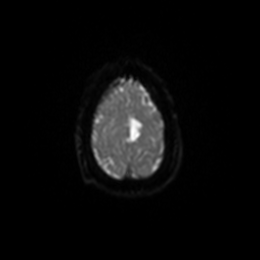
[im 102/102]
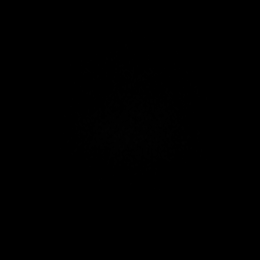

[Series 11: T2 · sagittal · 5.0mm · 0.47mm/px · 2 of 24 slices shown (1 of 3)]
[im 1/24]
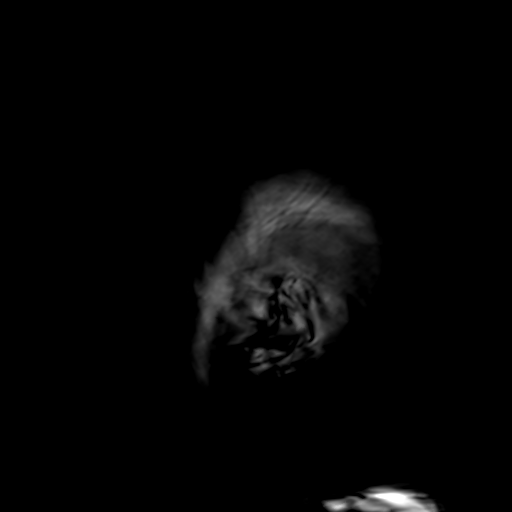
[im 24/24]
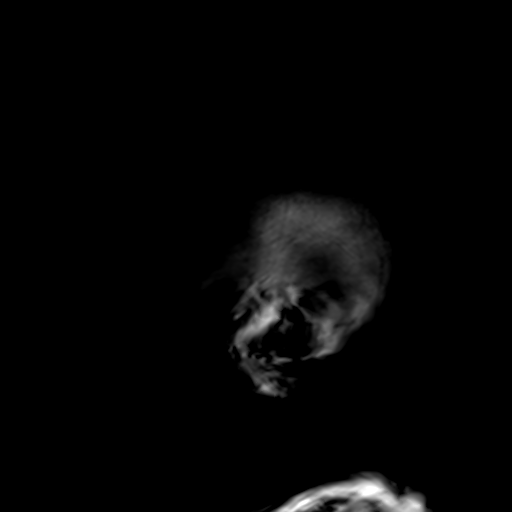

[Series 12: T2 · axial · 5.0mm · 0.45mm/px · z∈[-56,+96]mm · 3 of 26 slices shown (2 of 3)]
[im 1/26]
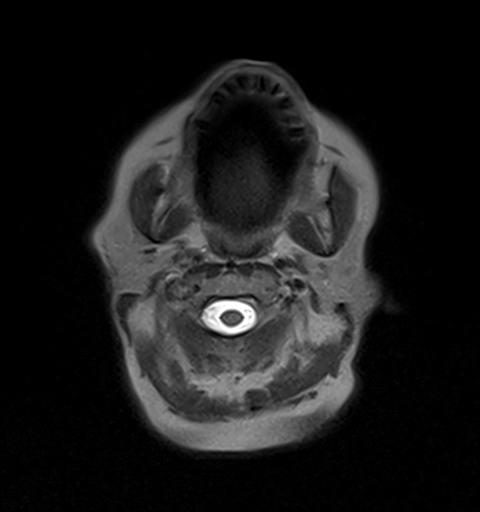
[im 13/26]
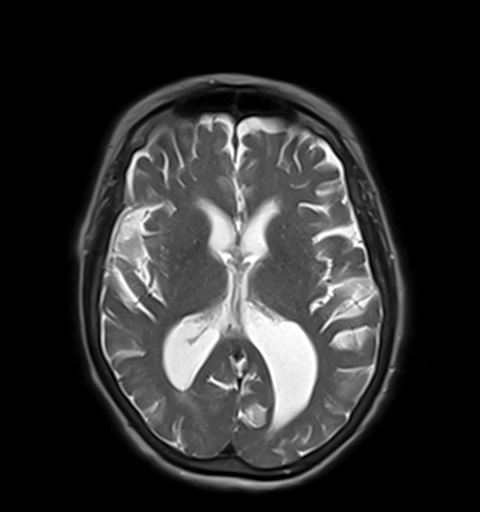
[im 26/26]
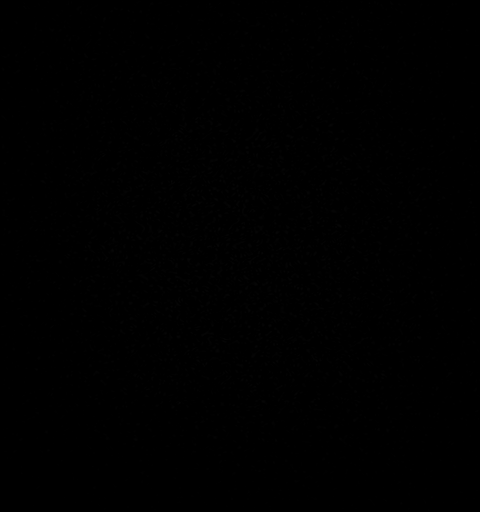

[Series 13: GRE · axial · 3.0mm · 0.45mm/px · z∈[-51,+90]mm · 5 of 51 slices shown]
[im 1/51]
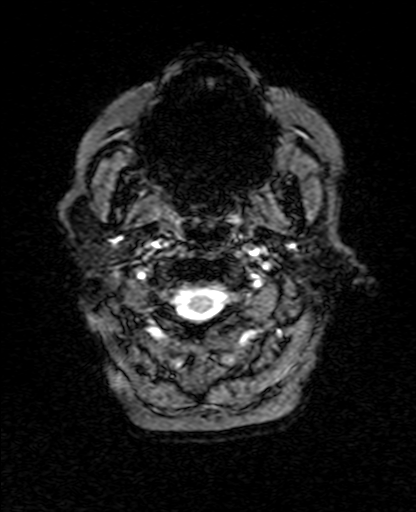
[im 13/51]
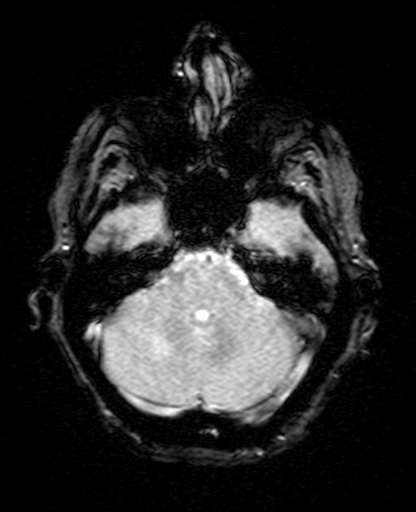
[im 26/51]
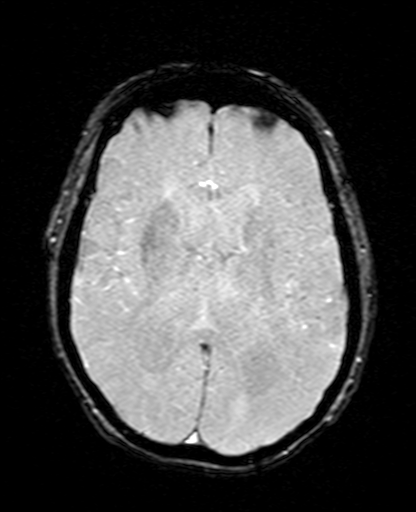
[im 38/51]
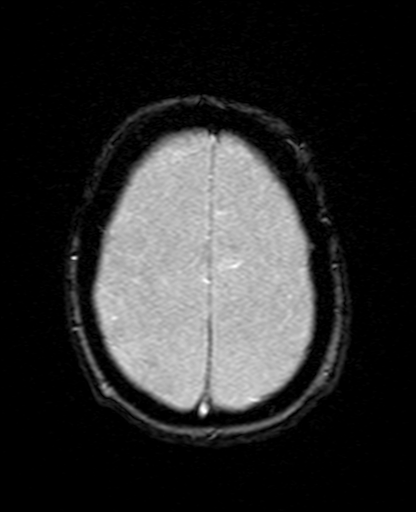
[im 51/51]
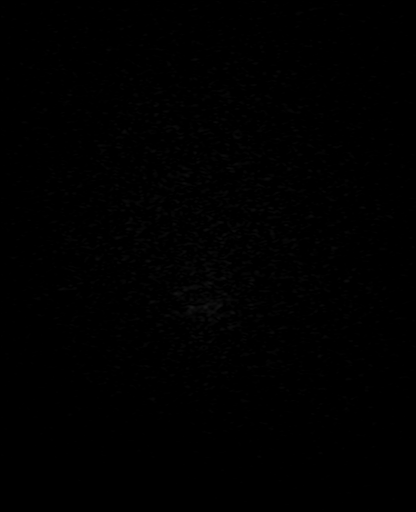

[Series 14: FLAIR · axial · 3.0mm · 0.86mm/px · z∈[-52,+89]mm · 5 of 51 slices shown]
[im 1/51]
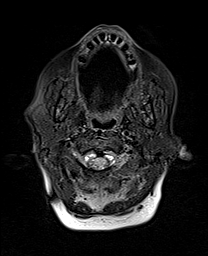
[im 13/51]
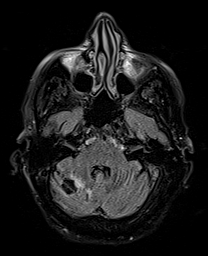
[im 26/51]
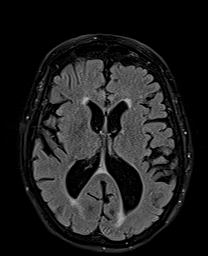
[im 38/51]
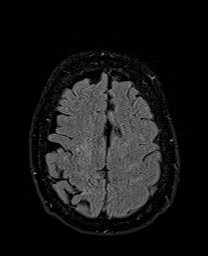
[im 51/51]
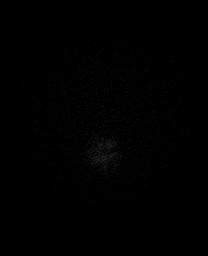

[Series 15: T1 · axial · 3.0mm · 0.45mm/px · z∈[-53,+88]mm · 5 of 51 slices shown]
[im 1/51]
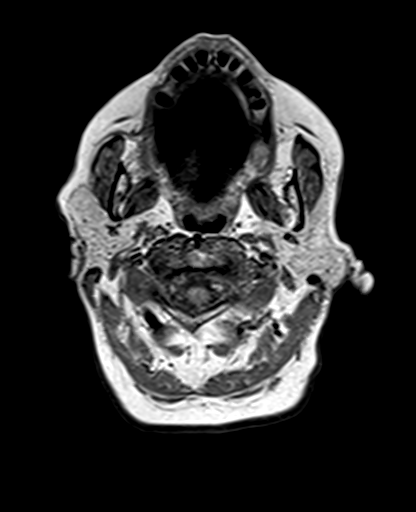
[im 13/51]
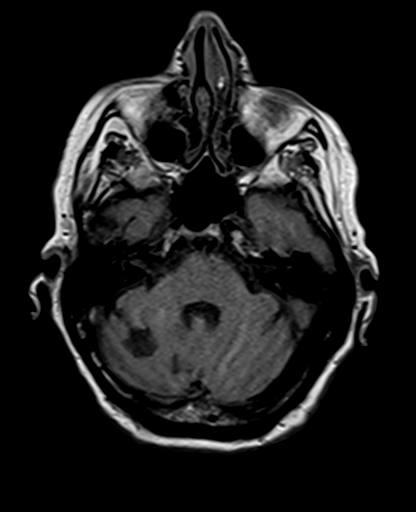
[im 26/51]
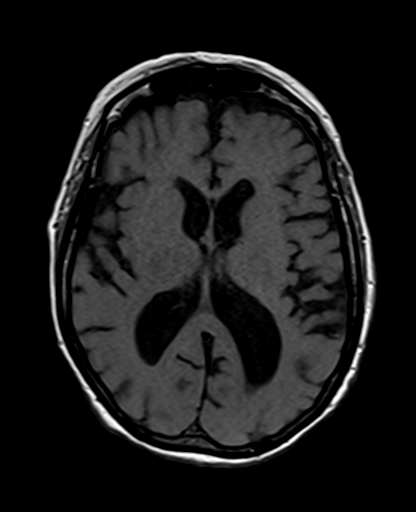
[im 38/51]
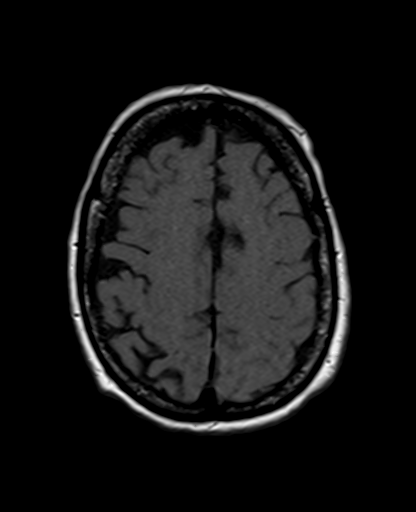
[im 51/51]
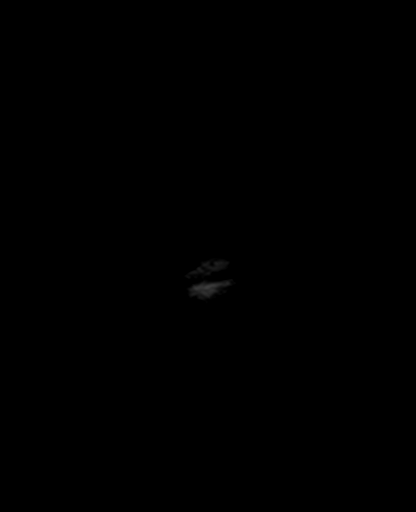

[Series 16: DWI · coronal · 5.0mm · 1.31mm/px · 6 of 64 slices shown (1 of 2)]
[im 1/64]
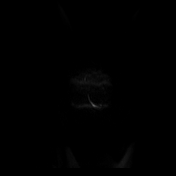
[im 13/64]
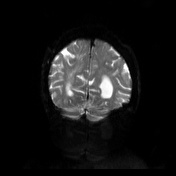
[im 26/64]
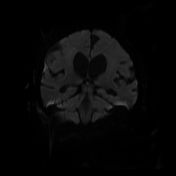
[im 38/64]
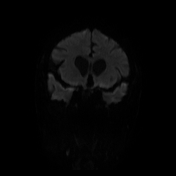
[im 51/64]
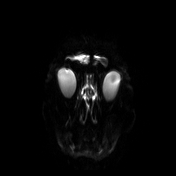
[im 64/64]
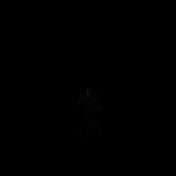

[Series 17: DWI · coronal · 5.0mm · 1.31mm/px · 3 of 32 slices shown (2 of 2)]
[im 1/32]
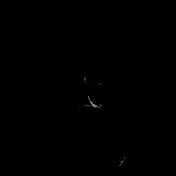
[im 16/32]
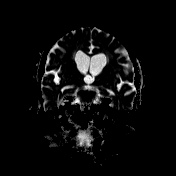
[im 32/32]
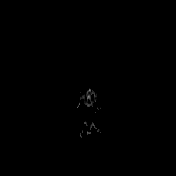

[Series 18: T2 · coronal · 5.0mm · 0.86mm/px · 3 of 28 slices shown (3 of 3)]
[im 1/28]
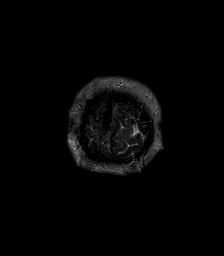
[im 14/28]
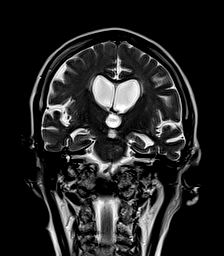
[im 28/28]
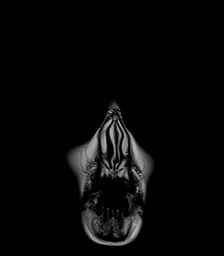

[40 of 48 positions shown; findings below may reference images not displayed]

FINDINGS: Evaluation is somewhat limited by motion artifact affecting several
sequences.

Brain: Restricted diffusion in the left and midline pons (series 9,
images 64-7). These areas demonstrate ADC correlate, as well as
mildly increased T2 signal, although some of this may be from a
prior left pontine infarct. No acute hemorrhage, intraparenchymal
mass, mass effect, or midline shift. Redemonstrated 1.5 cm
extra-axial lesion along the right frontotemporal convexity (series
14, image 29), unchanged, without evidence of signal abnormality in
the underlying right parenchyma size acute 6.

Diffuse T2 hyperintense signal in the periventricular white matter,
likely the sequela of moderate chronic small vessel ischemic
disease. Redemonstrated ventriculomegaly, likely sequela of central
volume loss. Remote right cerebellar infarct

Vascular: Normal flow voids.

Skull and upper cervical spine: Normal marrow signal.

Sinuses/Orbits: No acute or significant finding.

Other: Trace fluid in right mastoid air cells.
IMPRESSION: 1. Restricted diffusion in the left pons, likely reflecting acute
infarcts. There is mildly is increased T2 signal in this area, which
is favored to be from remote left pontine infarcts.
2. Unchanged 1.5 cm right frontotemporal convexity meningioma.

These results were called by telephone at the time of interpretation
on [DATE] at [DATE] to provider NIVIRUS , who verbally
acknowledged these results.

## 2021-10-15 MED ORDER — INSULIN GLARGINE-YFGN 100 UNIT/ML ~~LOC~~ SOLN
25.0000 [IU] | Freq: Every day | SUBCUTANEOUS | Status: DC
  Filled 2021-10-15: qty 0.25

## 2021-10-15 MED ORDER — ONDANSETRON HCL 4 MG/2ML IJ SOLN: 4.0000 mg | Freq: Four times a day (QID) | INTRAMUSCULAR | Status: DC | PRN

## 2021-10-15 MED ORDER — INSULIN ASPART 100 UNIT/ML IJ SOLN
0.0000 [IU] | Freq: Three times a day (TID) | INTRAMUSCULAR | Status: DC
Start: 2021-10-15 — End: 2021-10-20
  Administered 2021-10-16: 2 [IU] via SUBCUTANEOUS
  Administered 2021-10-16 – 2021-10-17 (×2): 5 [IU] via SUBCUTANEOUS
  Administered 2021-10-17: 3 [IU] via SUBCUTANEOUS
  Administered 2021-10-17: 8 [IU] via SUBCUTANEOUS
  Administered 2021-10-17: 11 [IU] via SUBCUTANEOUS
  Administered 2021-10-18 (×2): 3 [IU] via SUBCUTANEOUS
  Administered 2021-10-18: 8 [IU] via SUBCUTANEOUS
  Administered 2021-10-19: 11 [IU] via SUBCUTANEOUS
  Administered 2021-10-19: 5 [IU] via SUBCUTANEOUS
  Administered 2021-10-19: 3 [IU] via SUBCUTANEOUS
  Filled 2021-10-15: qty 0.15

## 2021-10-15 MED ORDER — STROKE: EARLY STAGES OF RECOVERY BOOK
Freq: Once | Status: AC
  Filled 2021-10-15: qty 1

## 2021-10-15 MED ORDER — ONDANSETRON HCL 4 MG PO TABS: 4.0000 mg | ORAL_TABLET | Freq: Four times a day (QID) | ORAL | Status: DC | PRN

## 2021-10-15 MED ORDER — ENOXAPARIN SODIUM 40 MG/0.4ML IJ SOSY
40.0000 mg | PREFILLED_SYRINGE | INTRAMUSCULAR | Status: DC
  Administered 2021-10-16 – 2021-10-19 (×5): 40 mg via SUBCUTANEOUS
  Filled 2021-10-15 (×5): qty 0.4

## 2021-10-15 MED ORDER — POLYETHYLENE GLYCOL 3350 17 G PO PACK: 17.0000 g | PACK | Freq: Every day | ORAL | Status: DC | PRN

## 2021-10-15 MED ORDER — LORAZEPAM 2 MG/ML IJ SOLN
1.0000 mg | Freq: Once | INTRAMUSCULAR | Status: AC | PRN
  Administered 2021-10-15: 2 mg via INTRAVENOUS
  Filled 2021-10-15: qty 1

## 2021-10-15 MED ORDER — ASPIRIN EC 81 MG PO TBEC
81.0000 mg | DELAYED_RELEASE_TABLET | Freq: Every day | ORAL | Status: DC
  Administered 2021-10-16: 81 mg via ORAL
  Filled 2021-10-15: qty 1

## 2021-10-15 MED ORDER — CLOPIDOGREL BISULFATE 75 MG PO TABS
75.0000 mg | ORAL_TABLET | Freq: Every morning | ORAL | Status: DC
  Administered 2021-10-16 – 2021-10-17 (×2): 75 mg via ORAL
  Filled 2021-10-15 (×2): qty 1

## 2021-10-15 MED ORDER — ACETAMINOPHEN 325 MG PO TABS
650.0000 mg | ORAL_TABLET | Freq: Four times a day (QID) | ORAL | Status: DC | PRN
  Administered 2021-10-16 – 2021-10-18 (×3): 650 mg via ORAL
  Filled 2021-10-15 (×3): qty 2

## 2021-10-15 MED ORDER — NITROFURANTOIN MACROCRYSTAL 50 MG PO CAPS
50.0000 mg | ORAL_CAPSULE | Freq: Every day | ORAL | Status: DC
  Administered 2021-10-16 – 2021-10-19 (×5): 50 mg via ORAL
  Filled 2021-10-15 (×5): qty 1

## 2021-10-15 MED ORDER — MECLIZINE HCL 25 MG PO TABS: 25.0000 mg | ORAL_TABLET | Freq: Two times a day (BID) | ORAL | Status: DC | PRN

## 2021-10-15 MED ORDER — ATORVASTATIN CALCIUM 40 MG PO TABS
40.0000 mg | ORAL_TABLET | Freq: Every day | ORAL | Status: DC
  Administered 2021-10-16 – 2021-10-19 (×4): 40 mg via ORAL
  Filled 2021-10-15 (×4): qty 1

## 2021-10-15 MED ORDER — DIPHENOXYLATE-ATROPINE 2.5-0.025 MG PO TABS
1.0000 | ORAL_TABLET | Freq: Every morning | ORAL | Status: DC
  Administered 2021-10-16 – 2021-10-17 (×2): 1 via ORAL
  Filled 2021-10-15 (×2): qty 1

## 2021-10-15 MED ORDER — PANTOPRAZOLE SODIUM 40 MG PO TBEC
40.0000 mg | DELAYED_RELEASE_TABLET | Freq: Every day | ORAL | Status: DC
  Administered 2021-10-16 – 2021-10-19 (×4): 40 mg via ORAL
  Filled 2021-10-15 (×4): qty 1

## 2021-10-15 MED ORDER — LORAZEPAM 2 MG/ML IJ SOLN
1.0000 mg | Freq: Once | INTRAMUSCULAR | Status: AC
  Administered 2021-10-15: 1 mg via INTRAVENOUS
  Filled 2021-10-15: qty 1

## 2021-10-15 MED ORDER — ACETAMINOPHEN 650 MG RE SUPP: 650.0000 mg | Freq: Four times a day (QID) | RECTAL | Status: DC | PRN

## 2021-10-15 MED ORDER — FLUOXETINE HCL 20 MG PO CAPS
20.0000 mg | ORAL_CAPSULE | Freq: Every morning | ORAL | Status: DC
  Administered 2021-10-16 – 2021-10-19 (×4): 20 mg via ORAL
  Filled 2021-10-15 (×4): qty 1

## 2021-10-15 NOTE — Assessment & Plan Note (Addendum)
.   Continuing lipitor . LDL 65 on 10/16/21

## 2021-10-15 NOTE — Assessment & Plan Note (Addendum)
-   Noncontrast MRI imaging of the brain revealing multiple infarcts in the left pons - On exam patient still exhibits the following deficit: Right lower extremity weakness, slurred speech and difficulty with ambulation - Other considered etiology on admission was possible emboli given recent history of MSSA bacteremia in Sept 2022 (follow up repeat blood cx) - Continue asa - p2y12 level is 252, consistent with Plavix nonresponder.  Discussed with neurology, recommended to transition patient to ticagrelor - plan is SNF at discharge for ongoing rehab

## 2021-10-15 NOTE — ED Notes (Signed)
Patient being transported to MRI

## 2021-10-15 NOTE — Assessment & Plan Note (Addendum)
.   Patient is currently chest pain free . Monitoring patient on telemetry . EKG reveals no evidence of ST segment change . Continue asa, statin, BB (dose lowered for low/normal BP)

## 2021-10-15 NOTE — ED Triage Notes (Signed)
Daughter reports that 3 days ago pt's right leg was weak. 2 days ago pt was dragging the right leg. Pt reports weakness all over yesterday. Today pt can move the right leg but it is not working right when she walks. Daughter reports pt woke up today with slurred speech.

## 2021-10-15 NOTE — Assessment & Plan Note (Addendum)
.   A1c 9% . Continue SSI and CBG . Continue semglee

## 2021-10-15 NOTE — ED Notes (Signed)
Pt transported to MRI with transporter

## 2021-10-15 NOTE — Assessment & Plan Note (Addendum)
.   Losartan resumed at lower dose; continue lopressor at lower dose  . Monitor for BP tolerance; may need further adjustment

## 2021-10-15 NOTE — ED Provider Notes (Signed)
Emergency Medicine Provider Triage Evaluation Note  Sonya Duffy , a 75 y.o. female  was evaluated in triage.  Pt complains of weakness in the right leg which has worsened over the last 3 days now making it very difficult for her to walk as well as slurred speech this morning.  Also complaining of feeling generally fatigued.  Review of Systems  Positive: Weakness, right-sided lower extremity weakness, slurred speech Negative: Appetite changes, fever, nausea, vomiting, chest pain or shortness of breath  Physical Exam  BP 137/61 (BP Location: Right Arm)   Pulse 72   Temp 98.4 F (36.9 C) (Oral)   Resp 18   Ht 5\' 2"  (1.575 m)   Wt 62.6 kg   SpO2 97%   BMI 25.24 kg/m  Gen:   Awake, no distress   Resp:  Normal effort  MSK:   Right lower extremity weakness 4 out of 5 compared to the left.  5 out of 5 strength in the bilateral upper extremities.  Minimal slurred speech noted.  No aphasia Other:  No abdominal pain  Medical Decision Making  Medically screening exam initiated at 11:07 AM.  Appropriate orders placed.  [name] was informed that the remainder of the evaluation will be completed by another provider, this initial triage assessment does not replace that evaluation, and the importance of remaining in the ED until their evaluation is complete.     Blanchie Dessert, MD 10/15/21 1108

## 2021-10-15 NOTE — ED Provider Notes (Signed)
Care of patient was assumed from Dr. Maryan Rued at 4:30 PM.  This is a 75 year old female who had recent hospitalizations.  In September, she was admitted with DKA.  At that time, she did suffer a CVA's.  She was admitted in October with urosepsis.  Since that time, she has lived with her daughter.  At baseline, she is ambulatory with a walker.  Patient began having changes in her gait on Saturday.  Yesterday, she was dragging her right leg.  Today, she has not able to walk.  Additionally, patient's daughter states that she has had some slurred speech which has resolved.  On exam, she has had right hemibody weakness.  This has been discussed with neurology who agrees with MRI/MRI studies.  Follow-up on these results and reengage with neurology.  She is currently on aspirin and Plavix.  Physical Exam  BP 128/86   Pulse 63   Temp 98.4 F (36.9 C) (Oral)   Resp 17   Ht 5\' 2"  (1.575 m)   Wt 62.6 kg   SpO2 98%   BMI 25.24 kg/m   Physical Exam Constitutional:      General: She is not in acute distress.    Appearance: She is normal weight. She is not toxic-appearing.  HENT:     Head: Normocephalic and atraumatic.  Cardiovascular:     Rate and Rhythm: Normal rate and regular rhythm.  Pulmonary:     Effort: Pulmonary effort is normal.  Skin:    General: Skin is warm and dry.    ED Course/Procedures     Procedures  MDM  On initial attempt MRI, patient was not able to lay still.  This was after 1 mg of Ativan.  She did return to the ED.  I spoke with her daughter, who does remain at bedside, who states that she has required increased doses of anxiolytic in the past for MRIs.  Additional as needed Ativan was ordered.  Patient was taken back to MRI and was able to get an MRI of brain.  She was not able to lay still long enough for MRA images.  MRI brain did show acute left pons infarcts.  I spoke with neurologist on-call, who recommended admission to [facility]  He did not recommend any loading  of medications.  He advised continuing her home aspirin and Plavix.  Patient was admitted to hospitalist for ongoing management.       Godfrey Pick, MD 10/16/21 [phone]

## 2021-10-15 NOTE — ED Provider Notes (Signed)
[facility] DEPT Provider Note   CSN: [id number] Arrival date & time: 10/15/21  1045     History Chief Complaint  Patient presents with   Weakness    Sonya Duffy is a 75 y.o. female.  Patient is a 75 year old female with a history of hypertension, NSTEMI, CVA and DKA in September who then had to return to the hospital several weeks ago due to urinary retention and elevated renal function who is presenting today with her daughter who she is staying with since discharge due to weakness in her right lower extremity.  Daughter noticed 2 days ago that it seemed to be a little weaker than baseline but she was still ambulating however yesterday she noticed the patient was having more difficulty getting out of bed standing and walking and today when physical therapy came patient was unable to lift her leg and was dragging it behind her.  Daughter also noted some slurred speech today.  She has been eating and drinking normally.  She has had no fever, cough, congestion or chest pain.  She has been taking her medications as prescribed but she has had multiple newer medications in the last few weeks after recent hospitalizations.  Patient denies any abdominal pain nausea or vomiting.  She just reports that her right leg feels heavy and she feels generally tired.  No recent falls or headaches.  The history is provided by the patient and a caregiver.  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     Family History  Problem Relation Age of Onset   Heart attack Mother     Social History   Tobacco Use   Smoking status: Never   Smokeless tobacco: Never  Substance Use Topics   Alcohol use: No   Drug use: No    Home Medications Prior to Admission medications   Medication Sig Start Date End Date Taking? Authorizing Provider  aspirin EC 81 MG tablet Take 1 tablet (81 mg total) by mouth daily. Swallow whole. 09/05/21 09/05/22  Pokhrel, Corrie Mckusick, MD  atorvastatin (LIPITOR) 40 MG tablet Take 1 tablet (40 mg total) by mouth daily. 09/06/21 09/06/22  Pokhrel, Corrie Mckusick, MD  clopidogrel (PLAVIX) 75 MG tablet Take 75 mg by mouth daily.    [provider]  clotrimazole (LOTRIMIN) 1 % cream Apply topically 2 (two) times daily. Apply on the buttocks area too 09/05/21   Pokhrel, Corrie Mckusick, MD  conjugated estrogens (PREMARIN) vaginal cream Place vaginally at bedtime. 09/05/21   Pokhrel, Corrie Mckusick, MD  diphenoxylate-atropine (LOMOTIL) 2.5-0.025 MG tablet Take 1 tablet by mouth daily.    [provider]  FLUoxetine (PROZAC) 20 MG capsule Take 20 mg by mouth daily.  09/20/21   [provider]  insulin glargine (LANTUS) 100 UNIT/ML injection Inject 0.25 mLs (25 Units total) into the skin at bedtime. 09/05/21   Pokhrel, Corrie Mckusick, MD  losartan (COZAAR) 50 MG tablet Take 50 mg by mouth daily. 08/17/19   [provider]  metFORMIN (GLUCOPHAGE) 1000 MG tablet Take 1,000 mg by mouth 2 (two) times daily. 08/26/19   [provider]  metoprolol tartrate (LOPRESSOR) 25 MG tablet Take 1 tablet (25 mg total) by mouth 2 (two) times daily. 09/05/21   Pokhrel, Corrie Mckusick, MD  pantoprazole (PROTONIX) 40 MG tablet Take 1 tablet (40 mg total) by mouth daily. 09/05/21  09/05/22  Pokhrel, Corrie Mckusick, MD  traMADol (ULTRAM) 50 MG tablet Take 50 mg by mouth every 6 (six) hours as needed for moderate pain. 09/10/21   [provider]  vitamin B-12 1000 MCG tablet Take 1 tablet (1,000 mcg total) by mouth daily. Patient not taking: Reported on 10/03/2021 09/06/21 12/15/21  Flora Lipps, MD    Allergies    Patient has no known allergies.  Review of Systems   Review of Systems  Neurological:  Positive for weakness.  All other systems reviewed and are negative.  Physical Exam Updated Vital Signs BP 110/64 (BP Location: Right Arm)   Pulse 63   Temp 98.4 F (36.9 C) (Oral)   Resp 18   Ht 5\' 2"  (1.575 m)   Wt 62.6 kg   SpO2 100%   BMI 25.24 kg/m   Physical Exam Vitals and nursing note reviewed.  Constitutional:      General: She is not in acute distress.    Appearance: Normal appearance. She is well-developed.  HENT:     Head: Normocephalic and atraumatic.     Mouth/Throat:     Mouth: Mucous membranes are moist.  Eyes:     Pupils: Pupils are equal, round, and reactive to light.  Cardiovascular:     Rate and Rhythm: Normal rate and regular rhythm.     Heart sounds: Normal heart sounds. No murmur heard.   No friction rub.  Pulmonary:     Effort: Pulmonary effort is normal.     Breath sounds: Normal breath sounds. No wheezing or rales.  Abdominal:     General: Bowel sounds are normal. There is no distension.     Palpations: Abdomen is soft.     Tenderness: There is no abdominal tenderness. There is no guarding or rebound.  Musculoskeletal:        General: No tenderness. Normal range of motion.     Cervical back: Normal range of motion and neck supple.     Comments: No edema  Skin:    General: Skin is warm and dry.     Findings: No rash.  Neurological:     Mental Status: She is alert and oriented to person, place, and time.     Cranial Nerves: No cranial nerve deficit.     Motor: Weakness and pronator drift present.     Comments: 4 out  of 5 strength in the right lower extremity.   Difficulty lifting it off the bed.  Left lower extremity with 5 out of 5 strength.  Sensation is intact.  4/5 strength in right upper ext and drift noted. She does have some tenderness in her right calf that she reports comes and goes.  No pronator drift noted in the left upper/lower extremities   No aphasia but intermittent mild slurred speech.  No visual field cuts.  Psychiatric:  Behavior: Behavior normal.    ED Results / Procedures / Treatments   Labs (all labs ordered are listed, but only abnormal results are displayed) Labs Reviewed  CBC - Abnormal; Notable for the following components:      Result Value   RBC 3.81 (*)    Hemoglobin 10.5 (*)    HCT 33.8 (*)    RDW 16.1 (*)    All other components within normal limits  COMPREHENSIVE METABOLIC PANEL - Abnormal; Notable for the following components:   Sodium 134 (*)    Glucose, Bld 136 (*)    Calcium 8.5 (*)    Total Protein 8.2 (*)    Alkaline Phosphatase 230 (*)    All other components within normal limits  RESP PANEL BY RT-PCR (FLU A&B, COVID) ARPGX2  ETHANOL  PROTIME-INR  APTT  DIFFERENTIAL  RAPID URINE DRUG SCREEN, HOSP PERFORMED  URINALYSIS, ROUTINE W REFLEX MICROSCOPIC    EKG None  Radiology CT HEAD WO CONTRAST  Result Date: 10/15/2021 CLINICAL DATA:  Slurred speech since this morning. EXAM: CT HEAD WITHOUT CONTRAST TECHNIQUE: Contiguous axial images were obtained from the base of the skull through the vertex without intravenous contrast. COMPARISON:  08/28/2021 and brain MR dated 08/28/2021. FINDINGS: Brain: Stable moderately enlarged ventricles and subarachnoid spaces. Mild-to-moderate patchy white matter low density in both cerebral hemispheres without significant change. Stable right lateral partially calcified extra-axial mass compatible with a meningioma measuring 1.6 cm in maximum diameter on image number 17/3. 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 More chronic appearance of the previously demonstrated left paracentral pontine infarct. No intracranial hemorrhage or new mass lesion seen. No acute areas of infarction visualized, limited with CT. Vascular: No hyperdense vessel or unexpected calcification. Skull: No hyperdense vessel or unexpected calcification. Sinuses/Orbits: Unremarkable. Other: None. IMPRESSION: 1. No acute abnormality. 2. Stable atrophy, chronic small vessel white matter ischemic changes, old infarcts and right lateral meningioma without mass effect. Electronically Signed   By: Claudie Revering M.D.   On: 10/15/2021 11:48    Procedures Procedures   Medications Ordered in ED Medications - No data to display  ED Course  I have reviewed the triage vital signs and the nursing notes.  Pertinent labs & imaging results that were available during my care of the patient were reviewed by me and considered in my medical decision making (see chart for details).    MDM Rules/Calculators/A&P                           Elderly female presenting today with 3 days of worsening right lower extremity weakness.  She has no leg pain at this time, evidence of swelling or signs of cellulitis.  Daughter also noted some slurred speech this morning.  Patient has had no episodes of hypoglycemia.  She has had no new pain medications.  She is still taking Plavix as she does have prior history of stroke.  On exam there is weakness noted to her right lower extremity but no pain.  Concern for recurrent stroke versus electrolyte abnormality.  However patient is not encephalopathic at this time.  Labs today show an alcohol of less than 10, PT and INR that are normal, CBC with normal white count and hemoglobin of 10.5 which is baseline, CMP with normal sodium potassium and renal function.  Blood sugar is less than 200.  Head CT with no acute abnormality and chronic changes noted.  Given patient's new symptoms we  will do MRI of brain  and MRA of head and neck for further evaluation based on Dr. Curly [place].   Final Clinical Impression(s) / ED Diagnoses Final diagnoses:  None    Rx / DC Orders ED Discharge Orders     None        Blanchie Dessert, MD 10/16/21 1314

## 2021-10-15 NOTE — H&P (Addendum)
History and Physical    Sonya Duffy VHQ:[id number] DOB: June 20, 1946 DOA: 10/15/2021  PCP: Elwyn Reach, MD  Patient coming from: Home   Chief Complaint:  Chief Complaint  Patient presents with   Weakness     HPI:    75 year old female with past medical history of insulin-dependent diabetes mellitus type 2 (hgba1c 08/2021 13.3%), hypertension, coronary artery disease status post NSTEMI, recent MSSA bacteremia 08/2021, vitamin B12 deficiency, meningioma, hyperlipidemia, gastroesophageal reflux disease and recent left pontine stroke who presents to [facility] emergency department with complaints of right lower extremity weakness.  Of note patient was recently hospitalized at [facility] from 9/19 until 9/28.  Patient found to be in diabetic ketoacidosis who was also incidentally found to have a left pontine stroke, MSSA bacteremia and an NSTEMI.   Work-up during the hospitalization revealed no evidence of intracardiac thrombus but also did not identify any source of the patient's MSSA bacteremia.  Patient's NSTEMI managed medically without cardiac catheterization.  Patient was discharged on 9/28 with a PICC line for continued treatment with cefazolin completed on 10/7.  Patient then presented again on 10/21 to the [facility] emergency department for weakness, found to have urinary retention as well as a urinary tract infection.  An indwelling Foley catheter was placed and patient was sent home on a course of oral Keflex with outpatient urology follow-up.  Daughter reports that approximately 3 days ago patient began to develop right leg weakness.  Initially patient was able to ambulate however over the next several days weakness progressively worsened and became quite severe and get difficult for the patient to ambulate.  The morning of 11/7, daughter noted that the patient was also exhibiting some slurred speech.  Daughter denies any associated facial droop, confusion or  other neurologic changes.  It was at this point that the patient presented to [facility] emergency department for evaluation.    Upon evaluation in the emergency department patient underwent CT imaging of the head without contrast which was unremarkable.  Case was discussed with Dr. Curly Shores with neurology and patient then proceeded with noncontrast MRI imaging of the brain revealing evidence of acute infarcts in left pons as well as evidence of remote left pontine infarcts.  Findings were discussed with Dr. [name] with allergy it was recommended the patient be transferred to [facility] onto the hospitalist service for neurology consultation.  The hospitalist group was then called to assess the patient for admission to the hospital.  Review of Systems:   Review of Systems  Neurological:  Positive for speech change and focal weakness.  All other systems reviewed and are negative.  Past Medical History:  Diagnosis Date   CVA (cerebral vascular accident) ([place]) 08/2021   Diabetes mellitus    DKA (diabetic ketoacidosis) ([place]) 08/2021   08/2021 A1c 13.3   Hyperlipidemia    Hypertension    NSTEMI (non-ST elevated myocardial infarction) ([place]) 08/2021   (a) admision 08/2021 with NSTEMI in setting of DKA and CVA    Past Surgical History:  Procedure Laterality Date   CHOLECYSTECTOMY       reports that she has never smoked. She has never used smokeless tobacco. She reports that she does not drink alcohol and does not use drugs.  No Known Allergies  Family History  Problem Relation Age of Onset   Heart attack Mother      Prior to Admission medications   Medication Sig Start Date End Date Taking? Authorizing  Provider  acetaminophen (TYLENOL) 500 MG tablet Take 1,000 mg by mouth every 6 (six) hours as needed for headache (pain).   Yes [provider]  aspirin EC 81 MG tablet Take 1 tablet (81 mg total) by mouth daily. Swallow whole. 09/05/21 09/05/22 Yes Pokhrel,  Laxman, MD  atorvastatin (LIPITOR) 40 MG tablet Take 1 tablet (40 mg total) by mouth daily. 09/06/21 09/06/22 Yes Pokhrel, Laxman, MD  clopidogrel (PLAVIX) 75 MG tablet Take 75 mg by mouth every morning.   Yes [provider]  diphenoxylate-atropine (LOMOTIL) 2.5-0.025 MG tablet Take 1 tablet by mouth every morning.   Yes [provider]  FLUoxetine (PROZAC) 20 MG capsule Take 20 mg by mouth every morning. 09/20/21  Yes [provider]  insulin glargine (LANTUS) 100 UNIT/ML injection Inject 0.25 mLs (25 Units total) into the skin at bedtime. 09/05/21  Yes Pokhrel, Laxman, MD  losartan (COZAAR) 50 MG tablet Take 50 mg by mouth every morning. 08/17/19  Yes [provider]  meclizine (ANTIVERT) 25 MG tablet Take 25 mg by mouth 2 (two) times daily. 10/12/21  Yes [provider]  metFORMIN (GLUCOPHAGE) 1000 MG tablet Take 1,000 mg by mouth 2 (two) times daily. 08/26/19  Yes [provider]  metoprolol tartrate (LOPRESSOR) 25 MG tablet Take 1 tablet (25 mg total) by mouth 2 (two) times daily. 09/05/21  Yes Pokhrel, Laxman, MD  nitrofurantoin (MACRODANTIN) 50 MG capsule Take 50 mg by mouth at bedtime. 10/11/21  Yes [provider]  pantoprazole (PROTONIX) 40 MG tablet Take 1 tablet (40 mg total) by mouth daily. 09/05/21 09/05/22 Yes Pokhrel, Laxman, MD  acyclovir (ZOVIRAX) 400 MG tablet Take 400 mg by mouth 2 (two) times daily. Patient not taking: Reported on 10/15/2021 10/03/21   [provider]  clotrimazole (LOTRIMIN) 1 % cream Apply topically 2 (two) times daily. Apply on the buttocks area too Patient not taking: Reported on 10/15/2021 09/05/21   Flora Lipps, MD  conjugated estrogens (PREMARIN) vaginal cream Place vaginally at bedtime. Patient not taking: Reported on 10/15/2021 09/05/21   Flora Lipps, MD  vitamin B-12 1000 MCG tablet Take 1 tablet (1,000 mcg total) by mouth daily. Patient not taking: No sig reported 09/06/21 12/15/21   Flora Lipps, MD    Physical Exam: Vitals:   10/15/21 2100 10/15/21 2130 10/15/21 2200 10/16/21 0141  BP: (!) 141/67 (!) 138/91 (!) 124/94 (!) 144/69  Pulse: 64 62 64 67  Resp: 19 18 16 12   Temp:      TempSrc:      SpO2: 97% 96% 95% 95%  Weight:      Height:        Constitutional: Awake alert and oriented x3, no associated distress.   Skin: no rashes, no lesions, good skin turgor noted. Eyes: Pupils are equally reactive to light.  No evidence of scleral icterus or conjunctival pallor.  ENMT: Moist mucous membranes noted.  Posterior pharynx clear of any exudate or lesions.   Neck: normal, supple, no masses, no thyromegaly.  No evidence of jugular venous distension.   Respiratory: clear to auscultation bilaterally, no wheezing, no crackles. Normal respiratory effort. No accessory muscle use.  Cardiovascular: Regular rate and rhythm, no murmurs / rubs / gallops. No extremity edema. 2+ pedal pulses. No carotid bruits.  Chest:   Nontender without crepitus or deformity.   Back:   Nontender without crepitus or deformity. Abdomen: Abdomen is soft and nontender.  No evidence of intra-abdominal masses.  Positive bowel sounds noted in  all quadrants.   Musculoskeletal: No joint deformity upper and lower extremities. Good ROM, no contractures. Normal muscle tone.  Neurologic: Right lower extremity weakness in proximal and distal muscle groups.  Right facial droop note.d  .  Patient is following all commands.  Patient is responsive to verbal stimuli.   Psychiatric: Patient exhibits normal mood with appropriate affect.  Patient seems to possess insight as to their current situation.     Labs on Admission: I have personally reviewed following labs and imaging studies -   CBC: Recent Labs  Lab 10/15/21 1143  WBC 8.5  NEUTROABS 5.5  HGB 10.5*  HCT 33.8*  MCV 88.7  PLT 536   Basic Metabolic Panel: Recent Labs  Lab 10/15/21 1143  NA 134*  K 4.6  CL 100  CO2 24  GLUCOSE 136*  BUN  22  CREATININE 0.94  CALCIUM 8.5*   GFR: Estimated Creatinine Clearance: 43.6 mL/min (by C-G formula based on SCr of 0.94 mg/dL). Liver Function Tests: Recent Labs  Lab 10/15/21 1143  AST 25  ALT 17  ALKPHOS 230*  BILITOT 0.6  PROT 8.2*  ALBUMIN 3.7   No results for input(s): LIPASE, AMYLASE in the last 168 hours. No results for input(s): AMMONIA in the last 168 hours. Coagulation Profile: Recent Labs  Lab 10/15/21 1143  INR 1.0   Cardiac Enzymes: No results for input(s): CKTOTAL, CKMB, CKMBINDEX, TROPONINI in the last 168 hours. BNP (last 3 results) No results for input(s): PROBNP in the last 8760 hours. HbA1C: No results for input(s): HGBA1C in the last 72 hours. CBG: Recent Labs  Lab 10/15/21 2226  GLUCAP 106*   Lipid Profile: No results for input(s): CHOL, HDL, LDLCALC, TRIG, CHOLHDL, LDLDIRECT in the last 72 hours. Thyroid Function Tests: No results for input(s): TSH, T4TOTAL, FREET4, T3FREE, THYROIDAB in the last 72 hours. Anemia Panel: No results for input(s): VITAMINB12, FOLATE, FERRITIN, TIBC, IRON, RETICCTPCT in the last 72 hours. Urine analysis:    Component Value Date/Time   COLORURINE YELLOW 10/15/2021 1247   APPEARANCEUR CLOUDY (A) 10/15/2021 1247   LABSPEC 1.012 10/15/2021 1247   PHURINE 5.0 10/15/2021 1247   GLUCOSEU NEGATIVE 10/15/2021 1247   HGBUR MODERATE (A) 10/15/2021 1247   BILIRUBINUR NEGATIVE 10/15/2021 1247   KETONESUR NEGATIVE 10/15/2021 1247   PROTEINUR 30 (A) 10/15/2021 1247   UROBILINOGEN 0.2 10/15/2012 2343   NITRITE NEGATIVE 10/15/2021 1247   LEUKOCYTESUR LARGE (A) 10/15/2021 1247    Radiological Exams on Admission - Personally Reviewed: CT HEAD WO CONTRAST  Result Date: 10/15/2021 CLINICAL DATA:  Slurred speech since this morning. EXAM: CT HEAD WITHOUT CONTRAST TECHNIQUE: Contiguous axial images were obtained from the base of the skull through the vertex without intravenous contrast. COMPARISON:  08/28/2021 and brain MR  dated 08/28/2021. FINDINGS: Brain: Stable moderately enlarged ventricles and subarachnoid spaces. Mild-to-moderate patchy white matter low density in both cerebral hemispheres without significant change. Stable right lateral partially calcified extra-axial mass compatible with a meningioma measuring 1.6 cm in maximum diameter on image number 17/3. Stable old right cerebellar infarcts with encephalomalacia/porencephaly. More chronic appearance of the previously demonstrated left paracentral pontine infarct. No intracranial hemorrhage or new mass lesion seen. No acute areas of infarction visualized, limited with CT. Vascular: No hyperdense vessel or unexpected calcification. Skull: No hyperdense vessel or unexpected calcification. Sinuses/Orbits: Unremarkable. Other: None. IMPRESSION: 1. No acute abnormality. 2. Stable atrophy, chronic small vessel white matter ischemic changes, old infarcts and right lateral meningioma without mass effect. Electronically Signed  By: Claudie Revering M.D.   On: 10/15/2021 11:48   MR BRAIN WO CONTRAST  Result Date: 10/15/2021 CLINICAL DATA:  Neuro deficit, stroke suspected EXAM: MRI HEAD WITHOUT CONTRAST TECHNIQUE: Multiplanar, multi-echo pulse sequences of the brain and surrounding structures were acquired without intravenous contrast. COMPARISON:  08/28/2021 MRI head FINDINGS: Evaluation is somewhat limited by motion artifact affecting several sequences. Brain: Restricted diffusion in the left and midline pons (series 9, images 64-7). These areas demonstrate ADC correlate, as well as mildly increased T2 signal, although some of this may be from a prior left pontine infarct. No acute hemorrhage, intraparenchymal mass, mass effect, or midline shift. Redemonstrated 1.5 cm extra-axial lesion along the right frontotemporal convexity (series 14, image 29), unchanged, without evidence of signal abnormality in the underlying right parenchyma size acute 6. Diffuse T2 hyperintense signal in  the periventricular white matter, likely the sequela of moderate chronic small vessel ischemic disease. Redemonstrated ventriculomegaly, likely sequela of central volume loss. Remote right cerebellar infarct Vascular: Normal flow voids. Skull and upper cervical spine: Normal marrow signal. Sinuses/Orbits: No acute or significant finding. Other: Trace fluid in right mastoid air cells. IMPRESSION: 1. Restricted diffusion in the left pons, likely reflecting acute infarcts. There is mildly is increased T2 signal in this area, which is favored to be from remote left pontine infarcts. 2. Unchanged 1.5 cm right frontotemporal convexity meningioma. These results were called by telephone at the time of interpretation on 10/15/2021 at 6:50 pm to provider Andreas Ohm , who verbally acknowledged these results. Electronically Signed   By: Merilyn Baba M.D.   On: 10/15/2021 18:55    Telemetry: Personally reviewed.  Rhythm is normal sinus rhythm.    Assessment/Plan  * Acute ischemic stroke [facility]) ED Imaging revealed: Noncontrast MRI imaging of the brain revealing multiple infarcts in the left pons On exam patient still exhibits the following deficit: Right lower extremity weakness, slurred speech and difficulty with ambulation I am particularly concerned as to why patient is developing multiple new infarcts several weeks after having been started on appropriate antiplatelet therapy with a normal TTE. Patient was recently diagnosed with MSSA bacteremia at that time with a source never identified -I am concerned that this could be playing a role causing cardioembolic phenomenon Patient may benefit from a TEE during this hospitalization to rule out any cardiac vegetations Obtaining surveillance blood cultures to ensure that they have remained clear Performing serial neurologic checks Monitoring patient on telemetry Per neurology recommendations continuing home regimen of antiplatelet therapy with aspirin and  Plavix Continue home regimen of statin therapy Further imaging to include: MRA of the head and neck Hemoglobin A1c and lipid panel will not be repeated as they have recently been done 5 weeks ago PT, OT, SLP evaluation Permissive hypertension with as needed antihypertensives only to be given if blood pressure greater than 220/115 Neurology following in consultation.   Type 2 diabetes mellitus with hyperglycemia, with long-term current use of insulin (HCC) Patient been placed on Accu-Cheks before every meal and nightly with sliding scale insulin Holding home regimen of oral hypoglycemics Continuing home regimen of basal insulin therapy Hemoglobin A1c has already been performed within the past 5 weeks and will not be repeated Diabetic Diet   Coronary artery disease involving native coronary artery of native heart without angina pectoris Patient is currently chest pain free Monitoring patient on telemetry EKG reveals no evidence of ST segment change Patient is already on antiplatelet therapy, lipid-lowering therapy.   We will resume  AV nodal blocking therapy once able.    Mixed diabetic hyperlipidemia associated with type 2 diabetes mellitus ([place]) Continuing home regimen of lipid lowering therapy. Lipid panel has already been performed within the past 5 weeks and therefore will not be repeated   Essential hypertension Permissive hypertension for now Holding home regimen of oral antihypertensives As needed intravenous intermittent is for markedly elevated blood pressure of greater than 220/115  Urinary retention Recently diagnosed 10/21  Now with indwelling foley catheter daily Nitrofurantoin for suppressive therapy      Code Status:  Full code  code status decision has been confirmed with: daughter and patient Family Communication: daughter has been updated on plan of care.   Status is: Inpatient  Remains inpatient appropriate because:   Acute strokes noted in the left  pons of unclear etiology requiring continued work-up including possible TEE.  Patient also did additionally requiring serial neurologic checks, close monitoring on telemetry, continue treatment antiplatelet therapy and lipid-lowering therapy with expert consultation with neurology.        Vernelle Emerald MD [facility] Pager [phone]  If 7PM-7AM, please contact night-coverage [email or web address] Use universal [name] password for that web site. If you do not have the password, please call the hospital operator.  10/16/2021, 1:45 AM

## 2021-10-16 DIAGNOSIS — I639 Cerebral infarction, unspecified: Secondary | ICD-10-CM | POA: Diagnosis present

## 2021-10-16 DIAGNOSIS — I2510 Atherosclerotic heart disease of native coronary artery without angina pectoris: Secondary | ICD-10-CM

## 2021-10-16 DIAGNOSIS — R339 Retention of urine, unspecified: Secondary | ICD-10-CM | POA: Diagnosis present

## 2021-10-16 LAB — CBC WITH DIFFERENTIAL/PLATELET
Abs Immature Granulocytes: 0.02 10*3/uL (ref 0.00–0.07)
Basophils Absolute: 0 10*3/uL (ref 0.0–0.1)
Basophils Relative: 1 %
Eosinophils Absolute: 0.2 10*3/uL (ref 0.0–0.5)
Eosinophils Relative: 2 %
HCT: 31.3 % — ABNORMAL LOW (ref 36.0–46.0)
Hemoglobin: 10.1 g/dL — ABNORMAL LOW (ref 12.0–15.0)
Immature Granulocytes: 0 %
Lymphocytes Relative: 35 %
Lymphs Abs: 3.1 10*3/uL (ref 0.7–4.0)
MCH: 28.3 pg (ref 26.0–34.0)
MCHC: 32.3 g/dL (ref 30.0–36.0)
MCV: 87.7 fL (ref 80.0–100.0)
Monocytes Absolute: 0.8 10*3/uL (ref 0.1–1.0)
Monocytes Relative: 9 %
Neutro Abs: 4.8 10*3/uL (ref 1.7–7.7)
Neutrophils Relative %: 53 %
Platelets: 370 10*3/uL (ref 150–400)
RBC: 3.57 MIL/uL — ABNORMAL LOW (ref 3.87–5.11)
RDW: 15.9 % — ABNORMAL HIGH (ref 11.5–15.5)
WBC: 8.9 10*3/uL (ref 4.0–10.5)
nRBC: 0 % (ref 0.0–0.2)

## 2021-10-16 LAB — COMPREHENSIVE METABOLIC PANEL
ALT: 16 U/L (ref 0–44)
AST: 24 U/L (ref 15–41)
Albumin: 3.3 g/dL — ABNORMAL LOW (ref 3.5–5.0)
Alkaline Phosphatase: 215 U/L — ABNORMAL HIGH (ref 38–126)
Anion gap: 11 (ref 5–15)
BUN: 21 mg/dL (ref 8–23)
CO2: 24 mmol/L (ref 22–32)
Calcium: 8.5 mg/dL — ABNORMAL LOW (ref 8.9–10.3)
Chloride: 99 mmol/L (ref 98–111)
Creatinine, Ser: 0.88 mg/dL (ref 0.44–1.00)
GFR, Estimated: >60 mL/min (ref >60)
Glucose, Bld: 99 mg/dL (ref 70–99)
Potassium: 3.6 mmol/L (ref 3.5–5.1)
Sodium: 134 mmol/L — ABNORMAL LOW (ref 135–145)
Total Bilirubin: 0.7 mg/dL (ref 0.3–1.2)
Total Protein: 7.5 g/dL (ref 6.5–8.1)

## 2021-10-16 LAB — LIPID PANEL
Cholesterol: 118 mg/dL (ref 0–200)
HDL: 31 mg/dL — ABNORMAL LOW (ref >40)
LDL Cholesterol: 65 mg/dL (ref 0–99)
Total CHOL/HDL Ratio: 3.8 RATIO
Triglycerides: 112 mg/dL (ref <150)
VLDL: 22 mg/dL (ref 0–40)

## 2021-10-16 LAB — CBG MONITORING, ED
Glucose-Capillary: 95 mg/dL (ref 70–99)
Glucose-Capillary: 135 mg/dL — ABNORMAL HIGH (ref 70–99)
Glucose-Capillary: 141 mg/dL — ABNORMAL HIGH (ref 70–99)
Glucose-Capillary: 243 mg/dL — ABNORMAL HIGH (ref 70–99)

## 2021-10-16 LAB — MAGNESIUM: Magnesium: 0.9 mg/dL — CL (ref 1.7–2.4)

## 2021-10-16 LAB — HEMOGLOBIN A1C
Hgb A1c MFr Bld: 9 % — ABNORMAL HIGH (ref 4.8–5.6)
Mean Plasma Glucose: 211.6 mg/dL

## 2021-10-16 MED ORDER — MAGNESIUM SULFATE 4 GM/100ML IV SOLN
4.0000 g | Freq: Once | INTRAVENOUS | Status: AC
  Administered 2021-10-16: 4 g via INTRAVENOUS
  Filled 2021-10-16: qty 100

## 2021-10-16 MED ORDER — INSULIN GLARGINE-YFGN 100 UNIT/ML ~~LOC~~ SOLN: 15.0000 [IU] | Freq: Every day | SUBCUTANEOUS | Status: DC

## 2021-10-16 MED ORDER — ASPIRIN EC 81 MG PO TBEC
162.0000 mg | DELAYED_RELEASE_TABLET | Freq: Every day | ORAL | Status: DC
  Administered 2021-10-17 – 2021-10-18 (×2): 162 mg via ORAL
  Filled 2021-10-16 (×2): qty 2

## 2021-10-16 MED ORDER — INSULIN GLARGINE-YFGN 100 UNIT/ML ~~LOC~~ SOLN
15.0000 [IU] | Freq: Every day | SUBCUTANEOUS | Status: DC
  Administered 2021-10-16 – 2021-10-19 (×5): 15 [IU] via SUBCUTANEOUS
  Filled 2021-10-16 (×5): qty 0.15

## 2021-10-16 NOTE — Progress Notes (Signed)
Progress Note    Sonya Duffy   YNW:[id number]  DOB: [birth year]  DOA: 10/15/2021     1 PCP: Elwyn Reach, MD  Initial CC: right side weakness  Hospital Course: Ms. Sonya Duffy is a 75 yo female with PMH CVA, DMII, HTN, HLD who presented with right leg weakness with increased difficulty walking due to this.  Her daughter had also reported slurred speech on admission. Initial CT head showed no acute abnormality with stable atrophy and chronic small vessel ischemic changes. MRI brain then showed acute infarcts involving the left pons and underlying unchanged 1.5 cm right frontotemporal convexity meningioma.  Interval History:  Patient still slightly confused this morning with ongoing slurred speech and right-sided weakness.  Assessment & Plan: * Acute ischemic stroke [facility]) ED Imaging revealed: Noncontrast MRI imaging of the brain revealing multiple infarcts in the left pons On exam patient still exhibits the following deficit: Right lower extremity weakness, slurred speech and difficulty with ambulation Other considered etiology on admission was possible emboli given recent history of MSSA bacteremia in Sept 2022 (follow up repeat blood cx) Follow up neuro eval Continue asa and plavix Follow up PT/OT/SLP  Urinary retention Recently diagnosed 10/21  Now with indwelling foley catheter daily Nitrofurantoin for suppressive therapy  Essential hypertension Permissive hypertension for now Holding home regimen of oral antihypertensives As needed intravenous intermittent is for markedly elevated blood pressure of greater than 220/115  Mixed diabetic hyperlipidemia associated with type 2 diabetes mellitus ([place]) Continuing home regimen of lipid lowering therapy. Lipid panel has already been performed within the past 5 weeks and therefore will not be repeated   Coronary artery disease involving native coronary artery of native heart without angina pectoris Patient is currently chest  pain free Monitoring patient on telemetry EKG reveals no evidence of ST segment change Patient is already on antiplatelet therapy, lipid-lowering therapy.   We will resume AV nodal blocking therapy once able.   Type 2 diabetes mellitus with hyperglycemia, with long-term current use of insulin (HCC) Patient been placed on Accu-Cheks before every meal and nightly with sliding scale insulin Holding home regimen of oral hypoglycemics Continuing home regimen of basal insulin therapy Hemoglobin A1c has already been performed within the past 5 weeks and will not be repeated Diabetic Diet     Old records reviewed in assessment of this patient  Antimicrobials: Nitrofurantoin   DVT prophylaxis: Lovenox  Code Status:   Code Status: Full Code  Disposition Plan: Status is: Inpatient  Remains inpatient appropriate because: treatment of above        Objective: Blood pressure 118/75, pulse 79, temperature 98.9 F (37.2 C), temperature source Axillary, resp. rate 18, height 5\' 2"  (1.575 m), weight 62.6 kg, SpO2 94 %.  Examination: Physical Exam Constitutional:      General: She is not in acute distress.    Appearance: Normal appearance.  HENT:     Head: Normocephalic and atraumatic.     Mouth/Throat:     Mouth: Mucous membranes are moist.  Eyes:     Extraocular Movements: Extraocular movements intact.  Cardiovascular:     Rate and Rhythm: Normal rate and regular rhythm.  Pulmonary:     Effort: Pulmonary effort is normal.     Breath sounds: Normal breath sounds.  Abdominal:     General: Bowel sounds are normal. There is no distension.     Palpations: Abdomen is soft.     Tenderness: There is no abdominal tenderness.  Musculoskeletal:  General: Normal range of motion.     Cervical back: Normal range of motion and neck supple.  Skin:    General: Skin is warm and dry.  Neurological:     Mental Status: She is alert.     Comments: 4/5 RUE/RLE, tremor noted in LUE; no  dysmetria. Dysarthria is present     Consultants:  Neuro  Procedures:    Data Reviewed: I have personally reviewed labs and imaging studies     LOS: 1 day  Time spent: Greater than 50% of the 35 minute visit was spent in counseling/coordination of care for the patient as laid out in the A&P.   Dwyane Dee, MD [facility] 10/16/2021, 4:49 PM

## 2021-10-16 NOTE — Progress Notes (Signed)
   10/16/21 1600  SLP Visit Information  SLP Received On 10/16/21  Reason Eval/Treat Not Completed  (Per notes, pt for transfer to [facility] for CVA work up, will follow up 11/9 if remains at [facility].  Pt passed [facility] swallow screen per flowsheets.)  Kathleen Lime, MS [place] Office [phone] Pager [phone] Patient ID: Sonya Duffy, female   DOB: 03-20-1946, 75 y.o.   MRN: [id number]

## 2021-10-16 NOTE — Progress Notes (Signed)
PT Note  Patient Details Name: Sonya Duffy MRN: [id number] DOB: [birth year]   Chart Reviewed, noted pt may be transferring to [facility] , will follow up to see is this is the case. If so, PT will assess pt at [facility], if not we will conduct assessment here at [facility].      Clide Dales 10/16/2021, 9:30 AM Gatha Mayer, PT, MPT Acute Rehabilitation Services Office: [phone] Pager: [phone] 10/16/2021

## 2021-10-16 NOTE — ED Notes (Signed)
Fed pt breakfast. Pt ate 50% of her breakfast.

## 2021-10-16 NOTE — Hospital Course (Addendum)
Ms. Sonya Duffy is a 75 yo female with PMH CVA, DMII, HTN, HLD who presented with right leg weakness with increased difficulty walking due to this.  Her daughter had also reported slurred speech on admission. Initial CT head showed no acute abnormality with stable atrophy and chronic small vessel ischemic changes. MRI brain then showed acute infarcts involving the left pons and underlying unchanged 1.5 cm right frontotemporal convexity meningioma. See below for further A&P.

## 2021-10-16 NOTE — ED Notes (Signed)
[facility] called in, stated that four calls ahead of pt. So it will be awhile before they can come.

## 2021-10-16 NOTE — Consult Note (Signed)
Neurology Consultation  Reason for Consult: worsening RUE, RLE weakness. MRIb with stroke.  Referring Physician: Ardeen Jourdain, MD.   CC: New stroke on MRI b.   History is obtained from: chart, daughter.    HPI: Sonya Duffy is a 75 y.o. 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 Patient presented on 10/15/21 to [facility] ED with c/o weakness in the RLE which had worsened over past 3 days making it difficult for her to walk and slurred speech. On exam, patient had right hemibody paresis but dysarthria has resolved.   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 Due to anxiety, patient was unable to complete MRA head and neck. She was continued on her home Plavix and ASA from the 9/22 stroke. Unfortunately, the Ativan she was given prior to MRA was ineffective, and dose of 1mg  was repeated. After dose was given, patient has been awake, agitated, paranoid, and confused since yesterday. Daughter states this is the same reaction she had with Ativan previously. Ativan added to allergies.   Given patient is confused this am and not a reliable historian, NP reached out to her daughter. Since patient had stroke in September, she has been in some type of rehab. Patient is living with daughter in a hospital bed in the den. Daughter states that patient has not returned back to her mental baseline since 9/22 stroke. She cleared mostly, but has remained a little bit confused, i.e., short term memory deficits and daughter has to repeat herself with directions, conversations, etc. Prior to this weekend, patient was able to get herself OOB to the bathroom with assistance of a walker, but no person assist. Prior to Oct's urosepsis with Foley  insertion, she was able to void in toilet. She has had no sequelae of weakness or dysarthria since 9/22 stroke. This weekend, daughter noticed patient unable to get herself OOB and was literally dragging her right leg when up. The daughter states "it was like she had a stroke right in front of me", with the dragging of the leg and distortion of face that occurred in the hospital after Ativan administration. Daughter thinks her facial droop has improved but is concerned and distressed about her waxing and waning symptoms. Daughter asking about discharge planning for rehab out side of the home. NP reassured her that therapy will make recommendations, but evaluation will likely not take place until her confusion resolves. Daughter has been giving the patient her medications, and there has not been any non adherence.   Recent hospitalizations:  9/22: DKA, subacute embolic infarct left pontine secondary to small vessel disease.  10/22: urosepsis, MSSA bactermia, NSTEMI.    08/30/21 stroke workup: MRI b subacute infarct of left pons, LDL 90 (Atorvastatin added), TSH 1.372, A1c 13.3, MMA 135. Echo with EF 55% and LVH, CTA head and neck with moderate to severe stenosis of several vessels.   In review of chart, patient was hospitalized in 9/22 for DKA and found to have a stroke. Her intracranial imaging showed atherosclerosis, so she was started on Plavix 75mg  po qd and ASA 325mg  po qd x 3 months, then ASA 325mg  po qd alone.  Per daughter patient is currently on dual antiplatelet therapy with aspirin 81 mg daily in addition to Plavix 75  mg daily as instructed by her outpatient providers  Due to finding of stroke on MRIb, neurology asked to see.   ROS: A robust ROS was performed and is negative except as noted in the HPI.   Past Medical History:  Diagnosis Date   CVA (cerebral vascular accident) ([place]) 08/2021   Diabetes mellitus    DKA (diabetic ketoacidosis) ([place]) 08/2021   08/2021 A1c 13.3   Hyperlipidemia     Hypertension    NSTEMI (non-ST elevated myocardial infarction) ([place]) 08/2021   (a) admision 08/2021 with NSTEMI in setting of DKA and CVA  MSSA bacteremia, urosepsis, benign meningioma, CKD  Family History  Problem Relation Age of Onset   Heart attack Mother    Social History:   reports that she has never smoked. She has never used smokeless tobacco. She reports that she does not drink alcohol and does not use drugs.  Medications  Current Facility-Administered Medications:    acetaminophen (TYLENOL) tablet 650 mg, 650 mg, Oral, Q6H PRN **OR** acetaminophen (TYLENOL) suppository 650 mg, 650 mg, Rectal, Q6H PRN, Shalhoub, Sherryll Burger, MD   aspirin EC tablet 81 mg, 81 mg, Oral, Daily, Shalhoub, Sherryll Burger, MD   atorvastatin (LIPITOR) tablet 40 mg, 40 mg, Oral, Daily, Shalhoub, Sherryll Burger, MD   clopidogrel (PLAVIX) tablet 75 mg, 75 mg, Oral, q morning, Shalhoub, Sherryll Burger, MD   diphenoxylate-atropine (LOMOTIL) 2.5-0.025 MG per tablet 1 tablet, 1 tablet, Oral, q morning, Shalhoub, Sherryll Burger, MD   enoxaparin (LOVENOX) injection 40 mg, 40 mg, Subcutaneous, Q24H, Shalhoub, Sherryll Burger, MD, 40 mg at 10/16/21 0151   FLUoxetine (PROZAC) capsule 20 mg, 20 mg, Oral, q morning, Shalhoub, Sherryll Burger, MD   insulin aspart (novoLOG) injection 0-15 Units, 0-15 Units, Subcutaneous, TID AC & HS, Shalhoub, Sherryll Burger, MD   insulin glargine-yfgn (SEMGLEE) injection 15 Units, 15 Units, Subcutaneous, QHS, Shalhoub, Sherryll Burger, MD, 15 Units at 10/16/21 0150   meclizine (ANTIVERT) tablet 25 mg, 25 mg, Oral, BID PRN, Shalhoub, Sherryll Burger, MD   nitrofurantoin (MACRODANTIN) capsule 50 mg, 50 mg, Oral, QHS, Shalhoub, Sherryll Burger, MD, 50 mg at 10/16/21 0150   ondansetron (ZOFRAN) tablet 4 mg, 4 mg, Oral, Q6H PRN **OR** ondansetron (ZOFRAN) injection 4 mg, 4 mg, Intravenous, Q6H PRN, Shalhoub, Sherryll Burger, MD   pantoprazole (PROTONIX) EC tablet 40 mg, 40 mg, Oral, Daily, Shalhoub, Sherryll Burger, MD   polyethylene glycol (MIRALAX / GLYCOLAX) packet  17 g, 17 g, Oral, Daily PRN, Shalhoub, Sherryll Burger, MD  Current Outpatient Medications:    acetaminophen (TYLENOL) 500 MG tablet, Take 1,000 mg by mouth every 6 (six) hours as needed for headache (pain)., Disp: , Rfl:    aspirin EC 81 MG tablet, Take 1 tablet (81 mg total) by mouth daily. Swallow whole., Disp: 150 tablet, Rfl: 2   atorvastatin (LIPITOR) 40 MG tablet, Take 1 tablet (40 mg total) by mouth daily., Disp: 30 tablet, Rfl: 11   clopidogrel (PLAVIX) 75 MG tablet, Take 75 mg by mouth every morning., Disp: , Rfl:    diphenoxylate-atropine (LOMOTIL) 2.5-0.025 MG tablet, Take 1 tablet by mouth every morning., Disp: , Rfl:    FLUoxetine (PROZAC) 20 MG capsule, Take 20 mg by mouth every morning., Disp: , Rfl:    insulin glargine (LANTUS) 100 UNIT/ML injection, Inject 0.25 mLs (25 Units total) into the skin at bedtime., Disp: 10 mL, Rfl: 11   losartan (COZAAR) 50 MG tablet, Take 50 mg by mouth every morning., Disp: , Rfl:  meclizine (ANTIVERT) 25 MG tablet, Take 25 mg by mouth 2 (two) times daily., Disp: , Rfl:    metFORMIN (GLUCOPHAGE) 1000 MG tablet, Take 1,000 mg by mouth 2 (two) times daily., Disp: , Rfl:    metoprolol tartrate (LOPRESSOR) 25 MG tablet, Take 1 tablet (25 mg total) by mouth 2 (two) times daily., Disp: 60 tablet, Rfl: 2   nitrofurantoin (MACRODANTIN) 50 MG capsule, Take 50 mg by mouth at bedtime., Disp: , Rfl:    pantoprazole (PROTONIX) 40 MG tablet, Take 1 tablet (40 mg total) by mouth daily., Disp: 30 tablet, Rfl: 1   acyclovir (ZOVIRAX) 400 MG tablet, Take 400 mg by mouth 2 (two) times daily. (Patient not taking: Reported on 10/15/2021), Disp: , Rfl:    clotrimazole (LOTRIMIN) 1 % cream, Apply topically 2 (two) times daily. Apply on the buttocks area too (Patient not taking: Reported on 10/15/2021), Disp: 30 g, Rfl: 0   conjugated estrogens (PREMARIN) vaginal cream, Place vaginally at bedtime. (Patient not taking: Reported on 10/15/2021), Disp: 42.5 g, Rfl: 12   vitamin B-12  1000 MCG tablet, Take 1 tablet (1,000 mcg total) by mouth daily. (Patient not taking: No sig reported), Disp: 30 tablet, Rfl: 3   Exam: Current vital signs: BP 121/61   Pulse 83   Temp 98.4 F (36.9 C) (Oral)   Resp 18   Ht 5\' 2"  (1.575 m)   Wt 62.6 kg   SpO2 94%   BMI 25.24 kg/m  Vital signs in last 24 hours: Temp:  [98.4 F (36.9 C)] 98.4 F (36.9 C) (11/07 1052) Pulse Rate:  [57-95] 83 (11/08 0547) Resp:  [12-22] 18 (11/08 0547) BP: (110-144)/(58-94) 121/61 (11/08 0547) SpO2:  [94 %-100 %] 94 % (11/08 0547) Weight:  [62.6 kg] 62.6 kg (11/07 1057)  PE: GENERAL: Fairly well, chronically ill appearing female who is restless in ED bed. Awake, alert but confused.  HEENT: normocephalic and atraumatic. Oral mucosa dry.  LUNGS - Normal respiratory effort.  CV - RRR on tele. ABDOMEN - Soft, nontender. Ext: warm, well perfused. Psych: affect paranoid.   NEURO:  Mental Status: Alert and oriented to name and age. States "I don't know all that stuff" when asked other orientation questions. Follows some commands with repeated coaching and not others.   Speech/Language: speech is without dysarthria or aphasia. Repeats "why am I here?", "They are trying to give me medications to hurt me", "who brought me here?".  Naming, repetition, fluency intact. Bradyphrenic. She has circumferential speech-explains what a watch does without saying it tells time. (It helps with appointments, you look at it, it is a reminder).   Cranial Nerves: She does not comprehend portions of exam.  II: PERRL 4 mm/brisk. Does not understand visual fields exam even with repeated coaching.   III, IV, VI: EOMI-she will perform this about 1/2 way, but will not look far to the right or left. Lid elevation symmetric and full.  V: sensation is intact and symmetrical to face.  VII: Mild right facial droop. Does not comprehend puffing cheeks, but her eyebrow raise is symmetrical.   VIII:hearing intact to voice. IX, X:  palate elevation is symmetric. Phonation normal.  XI: normal sternocleidomastoid and trapezius muscle strength. GEX:BMWUXL is symmetrical without fasciculations.   Motor:  RUE: grips  4/5       triceps 4/5     biceps  4+/5      LUE: grips  5/5      triceps  5/5  biceps   5/5 RLE:  knee  5/5    thigh  5/5     plantar flexion  5/5     dorsiflexion   5/5 LLE:  knee  5/5   thigh  5/5     plantar flexion   5/5     dorsiflexion   5/5 Tone is normal. Bulk is normal.  Sensation- Intact to light touch bilaterally in all four extremities. Extinction absent to DSS.  Coordination: Does not comprehend ataxia exam. No pronator drift.  DTRs:  2+ throughout.  Gait- deferred.  Labs I have reviewed labs in epic and the results pertinent to this consultation are: 08/30/21-TSH 1.372, A1c 13.3, LDL 90.   CBC    Component Value Date/Time   WBC 8.9 10/16/2021 0500   RBC 3.57 (L) 10/16/2021 0500   HGB 10.1 (L) 10/16/2021 0500   HCT 31.3 (L) 10/16/2021 0500   PLT 370 10/16/2021 0500   MCV 87.7 10/16/2021 0500   MCH 28.3 10/16/2021 0500   MCHC 32.3 10/16/2021 0500   RDW 15.9 (H) 10/16/2021 0500   LYMPHSABS 3.1 10/16/2021 0500   MONOABS 0.8 10/16/2021 0500   EOSABS 0.2 10/16/2021 0500   BASOSABS 0.0 10/16/2021 0500    CMP     Component Value Date/Time   NA 134 (L) 10/16/2021 0500   K 3.6 10/16/2021 0500   CL 99 10/16/2021 0500   CO2 24 10/16/2021 0500   GLUCOSE 99 10/16/2021 0500   BUN 21 10/16/2021 0500   CREATININE 0.88 10/16/2021 0500   CALCIUM 8.5 (L) 10/16/2021 0500   PROT 7.5 10/16/2021 0500   ALBUMIN 3.3 (L) 10/16/2021 0500   AST 24 10/16/2021 0500   ALT 16 10/16/2021 0500   ALKPHOS 215 (H) 10/16/2021 0500   BILITOT 0.7 10/16/2021 0500   GFRNONAA >60 10/16/2021 0500   GFRAA 53 (L) 09/14/2019 1125    Lipid Panel 9/22    Component Value Date/Time   CHOL 134 08/28/2021 0815   TRIG 125 08/28/2021 0815   HDL 19 (L) 08/28/2021 0815   CHOLHDL 7.1 08/28/2021 0815   VLDL 25  08/28/2021 0815   LDLCALC 90 08/28/2021 0815   Lab Results  Component Value Date   CHOL 118 10/16/2021   HDL 31 (L) 10/16/2021   LDLCALC 65 10/16/2021   TRIG 112 10/16/2021   CHOLHDL 3.8 10/16/2021   Previously 13.3 in 08/28/2021 Lab Results  Component Value Date   HGBA1C 9.0 (H) 10/16/2021      Imaging MD reviewed the images obtained.  CT head Agree with radiology, no acute intracranial process  CT head and neck 08/28/2021 CTA head:  1. 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 2. Moderate (approximately 50%) stenosis of the mid to dist al basilar artery and severe stenosis of the non-dominant left intradural vertebral artery distally. 3. Small bilateral PCAs with multifocal severe stenosis of the left  P2 PCA.   CTA neck:  Bilateral carotid bifurcation atherosclerosis without greater than 50% stenosis relative to the distal vessels.  MRI brain 1. Restricted diffusion in the left pons, likely reflecting acute infarcts. There is mildly is increased T2 signal in this area, which is favored to be from remote left pontine infarcts. 2. Unchanged 1.5 cm right frontotemporal convexity meningioma.    Assessment: This is a 75 year old woman presenting with recurrent atheroembolic versus small vessel stroke in the pons leading to acute right-sided weakness.  Waxing and waning symptoms are likely secondary to small vessel etiology with a component of decompensation in the setting of sedation and fatigue.  Discussed with daughter that the patient is at high risk of further strokes despite maximal medical management given her significant intracranial atherosclerosis  Impression: -Appreciate continued adjustment of diabetes medications for goal A1c less than 7%, note marked improvement from prior 13.3% to 9% and note that overly rapid correction is not desired due to risk of neuropathy with aggressive acute  lowering -LDL now meeting goal at less than 70% -We will send P2Y12 level, if this suggests the patient is a nonresponder to Plavix recommend switch to ticagrelor.  Discussed with daughter that ticagrelor does have a higher risk of intracerebral hemorrhage -Daughter is interested in further goals of care discussion with outpatient providers pending assessment of her mother's recovery from this latest neurological event -No need to repeat ECHO from a neurological perspective given recent study completed  -Discussed with daughter that Ativan may be used if absolutely necessary for example in the setting of seizure, but should be avoided for sedation given patient's significant paradoxical agitation -Appreciate continued PT/OT -Patient is out of the window for permissive hypertension and long-term blood pressure goal is normotension -No need for further neurological inpatient work-up, neurology will be available on an as-needed basis going forward   Pt seen by Clance Boll, NP/Neuro and later by MD. Note/plan to be edited by MD as needed.  Pager: [phone]  Attending Neurologist's note:  On my examination, patient had just fallen asleep for the first time since coming to the hospital and was quite somnolent, NP exam as documented above.  Generally patient is a thin elderly woman who is breathing comfortably with a regular rate and rhythm and no abdominal tenderness, gross muscular deformities or lower extremity edema or significant skin changes  Mental status: Fall asleep readily during examination, requires repeated stimulation to stay awake.  Follows some simple commands but extremely poor attention and concentration.  Able to name and repeat some simple things but language examination limited due to her sleepiness.  No evidence of neglect. Cranial nerves: Pupils equal round reactive to light, orients to stimuli in all directions, face with a slight right facial droop, tongue  midline Motor: Difficult to assess given her significant sleepiness, but is notably weaker on the right side compared to the left, with right hip externally rotated at rest. Reflexes: 3+ brachioradialis on the right, 2+ brachioradialis on the left, 3+ patellar's (crossed) Coordination: Too sleepy to assess  NIH significantly impacted by her overall sleepiness, however formally she scores a 10: One-point for drowsiness, one-point for not answering age correctly, one-point for not following all commands, one-point for right facial droop, one-point for left upper extremity drift and one-point for right upper extremity drift (though the right upper extremity drifts more than the left it does not hit the bed), one-point for left lower extremity drift, 2 points for right lower extremity drift, one-point for impaired repetition (actually felt to be more secondary to poor attention due to sleepiness)  I personally saw this patient, gathering history, performing a neurologic examination, reviewing relevant labs, personally reviewing relevant imaging including MRI brain, CTA, and formulated the assessment and plan, adding the note above for completeness and clarity to accurately reflect my thoughts  Lesleigh Noe MD-PhD [facility] [phone]  Available 7 AM to 7 PM, outside these hours please contact Neurologist on call listed on AMION

## 2021-10-16 NOTE — Assessment & Plan Note (Addendum)
   Recently diagnosed 10/21   Now with indwelling foley catheter  daily Nitrofurantoin for suppressive therapy  Follow up with Dr. Alinda Money, urology

## 2021-10-17 DIAGNOSIS — L8990 Pressure ulcer of unspecified site, unspecified stage: Secondary | ICD-10-CM | POA: Insufficient documentation

## 2021-10-17 LAB — GLUCOSE, CAPILLARY
Glucose-Capillary: 209 mg/dL — ABNORMAL HIGH (ref 70–99)
Glucose-Capillary: 276 mg/dL — ABNORMAL HIGH (ref 70–99)
Glucose-Capillary: 320 mg/dL — ABNORMAL HIGH (ref 70–99)

## 2021-10-17 LAB — BASIC METABOLIC PANEL
Anion gap: 9 (ref 5–15)
BUN: 18 mg/dL (ref 8–23)
CO2: 26 mmol/L (ref 22–32)
Calcium: 8.9 mg/dL (ref 8.9–10.3)
Chloride: 99 mmol/L (ref 98–111)
Creatinine, Ser: 0.93 mg/dL (ref 0.44–1.00)
GFR, Estimated: >60 mL/min (ref >60)
Glucose, Bld: 258 mg/dL — ABNORMAL HIGH (ref 70–99)
Potassium: 4 mmol/L (ref 3.5–5.1)
Sodium: 134 mmol/L — ABNORMAL LOW (ref 135–145)

## 2021-10-17 LAB — MAGNESIUM: Magnesium: 1.9 mg/dL (ref 1.7–2.4)

## 2021-10-17 LAB — CBG MONITORING, ED: Glucose-Capillary: 173 mg/dL — ABNORMAL HIGH (ref 70–99)

## 2021-10-17 MED ORDER — MELATONIN 3 MG PO TABS
3.0000 mg | ORAL_TABLET | Freq: Every day | ORAL | Status: DC
  Administered 2021-10-17: 3 mg via ORAL
  Filled 2021-10-17: qty 1

## 2021-10-17 MED ORDER — CHLORHEXIDINE GLUCONATE CLOTH 2 % EX PADS: 6.0000 | MEDICATED_PAD | Freq: Every day | CUTANEOUS | Status: DC

## 2021-10-17 MED ORDER — CHLORHEXIDINE GLUCONATE CLOTH 2 % EX PADS
6.0000 | MEDICATED_PAD | Freq: Every day | CUTANEOUS | Status: DC
  Administered 2021-10-18 – 2021-10-19 (×2): 6 via TOPICAL

## 2021-10-17 NOTE — ED Notes (Signed)
Floor nurse re-paged.

## 2021-10-17 NOTE — ED Notes (Signed)
Per lab, platelets need to be redrawn due to cone machine being down and labs being too old to send to [facility]. Lab states they will send up labwork that needs collecting.

## 2021-10-17 NOTE — Evaluation (Signed)
Speech Language Pathology Evaluation Patient Details Name: Sonya Duffy MRN: [id number] DOB: [birth year] Today's Date: 10/17/2021 Time: 1755-1830 SLP Time Calculation (min) (ACUTE ONLY): 35 min  Problem List:  Patient Active Problem List   Diagnosis Date Noted   Pressure injury of skin 10/17/2021   Urinary retention 10/16/2021   CVA (cerebral vascular accident) ([place]) 10/16/2021   Acute ischemic stroke ([place]) 10/15/2021   Type 2 diabetes mellitus with hyperglycemia, with long-term current use of insulin ([place]) 10/15/2021   Coronary artery disease involving native coronary artery of native heart without angina pectoris 10/15/2021   Mixed diabetic hyperlipidemia associated with type 2 diabetes mellitus ([place]) 10/15/2021   Essential hypertension 10/15/2021   Acute vaginitis    Oral thrush    MSSA bacteremia    Non-ST elevation (NSTEMI) myocardial infarction ([place]) 08/29/2021   Cerebral thrombosis with cerebral infarction 08/28/2021   DKA (diabetic ketoacidosis) ([place]) 08/27/2021   Past Medical History:  Past Medical History:  Diagnosis Date   CVA (cerebral vascular accident) ([place]) 08/2021   Diabetes mellitus    DKA (diabetic ketoacidosis) ([place]) 08/2021   08/2021 A1c 13.3   Hyperlipidemia    Hypertension    NSTEMI (non-ST elevated myocardial infarction) ([place]) 08/2021   (a) admision 08/2021 with NSTEMI in setting of DKA and CVA   Past Surgical History:  Past Surgical History:  Procedure Laterality Date   CHOLECYSTECTOMY     HPI:  75 yo female admitted with dysarthria and right sided weakness.  Per MD  "PMH CVA (left pons 08/2021), DMII, HTN, HLD.   Her daughter had also reported slurred speech on admission.  Initial CT head showed no acute abnormality with stable atrophy and chronic small vessel ischemic changes.  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 No family present at time of eval.    Pt admits to speech deficits worse currently than baseline.  Her son (who is 16 and is home full time) provides some care per pt. Daughter, Sonya Duffy, manages pt's bills, medications, appts, etc.   Assessment / Plan / Recommendation Clinical Impression  Pt presents with moderate dysarthria with impairment in articulation and phonatory strength resulting in imprecise articulation and decreased intelligiblity.   Facial, trigeminal and hypoglossal nerves involved with this event..  She is amenable to compensation strategies including slowing rate and overarticulation - as well as environment controls *turning off tv, closing door, etc for efficiency of communication/enhance listener ability.  Pt with inconsistent orientation and medical history review, initially denying h/o stroke - but later stating she had a prior stroke *without recall of prior denial.  Pt also with hoarse vocal quality which she advised occured after her prior stroke- question source?   Word finding deficit observed x2 during session in conversation, which pt states is normal.  SLP questions if pt may have some baseline cognitive deficits - She has lower education background, stating she did not graduate HS but uncertain level of education obtained. Also question if she has low medical literacy - admits to stopping some medication in error.  SLP will follow up for dysarthria treatment and to establish baseline cognitive linguistic skills with family.  Pt repeatedly stated that "Sonya Duffy knows everything" Sonya Duffy being her daughter. If cognitive linguistic findings are chronic and pt has support needed from family, dysarthria treatment on trial basis for generalization attempts indicated.  Pt agreeable to  SLP follow up if she can also work with PT on her "feet".    SLP Assessment  SLP Recommendation/Assessment: Patient needs continued Speech Lanaguage Pathology Services SLP  Visit Diagnosis: Dysarthria and anarthria (R47.1);Cognitive communication deficit (R41.841)    Recommendations for follow up therapy are one component of a multi-disciplinary discharge planning process, led by the attending physician.  Recommendations may be updated based on patient status, additional functional criteria and insurance authorization.    Follow Up Recommendations  Acute inpatient rehab (3hours/day) TBD, SNF vs CIR   Assistance Recommended at Discharge  Frequent or constant Supervision/Assistance  Functional Status Assessment Patient has had a recent decline in their functional status and demonstrates the ability to make significant improvements in function in a reasonable and predictable amount of time.  Frequency and Duration min 1 x/week  1 week      SLP Evaluation Cognition  Overall Cognitive Status: No family/caregiver present to determine baseline cognitive functioning (suspect near baseline as pt reports she does not keep up with dates, etc) Year: Other (Comment) (states she does not keep up with the dates) Month: November (with phonemic cue, pt able to say "It's thanksgiving this month") Day of Week: Correct Memory: Impaired Memory Impairment: Storage deficit (pt did not recall that she had a stroke currently, nor that she had a prior stroke and heart attack earlier this year - initially denied but later admitted to prior cva) Awareness: Impaired Awareness Impairment: Intellectual impairment (total cues for pt to slow rate and overarticulate) Safety/Judgment: Impaired       Comprehension  Auditory Comprehension Overall Auditory Comprehension: Impaired Yes/No Questions: Not tested Commands: Within Functional Limits (for basic one step- beyond was difficult for pt requiring repetition and visual cues *eg testing lingual strength) Conversation: Simple Interfering Components: Processing speed;Working Marine scientist;Attention;Visual impairments Reading  Comprehension Reading Status: Impaired (pt read single word "stroke" - states she does not read well) Effective Techniques: Large print    Expression Expression Primary Mode of Expression: Verbal Verbal Expression Overall Verbal Expression: Impaired Initiation: No impairment Level of Generative/Spontaneous Verbalization: Sentence Repetition: Impaired Level of Impairment: Phrase level (at complex phrase level and lengthier information) Naming: No impairment (for items in the room) Pragmatics: No impairment (pt is verbose, question if some verbosity is to cover for cognitive deficits, pt says "I talk alot") Interfering Components: Attention;Speech intelligibility Other Verbal Expression Comments: word finding deficits noted x2 during evaluation *including stating what her daughter does for work from home* discussing pt's prior medical events - she admits to premorbid difficulties with verbal expression Written Expression Dominant Hand: Right Written Expression: Not tested   Oral / Motor  Oral Motor/Sensory Function Overall Oral Motor/Sensory Function: Moderate impairment Facial ROM: Reduced right Facial Symmetry: Abnormal symmetry right Facial Strength: Reduced right Facial Sensation: Reduced right;Suspected CN V (Trigeminal) dysfunction (lower branch) Lingual ROM: Reduced right;Reduced left;Suspected CN XII (hypoglossal) dysfunction Lingual Symmetry: Within Functional Limits Lingual Strength: Reduced;Suspected CN XII (hypoglossal) dysfunction Lingual Sensation: Reduced;Suspected CN VII (facial) dysfunction-anterior 2/3 tongue Velum: Within Functional Limits Mandible: Within Functional Limits Motor Speech Overall Motor Speech: Impaired Respiration: Impaired Level of Impairment: Phrase Phonation: Hoarse;Low vocal intensity (pt reports hoarse since prior hospital admission to [facility]) Resonance: Within functional limits Articulation: Impaired Level of Impairment:  Phrase Intelligibility: Intelligibility reduced Word: 75-100% accurate Phrase: 75-100% accurate Sentence: 50-74% accurate Conversation: 25-49% accurate Motor Planning: Witnin functional limits Motor Speech Errors: Not applicable Effective Techniques: Slow rate;Over-articulate   GO  Kathleen Lime, MS [facility] SLP Acute Rehab Services Office [phone] Pager [phone]  Macario Golds 10/17/2021, 7:04 PM

## 2021-10-17 NOTE — ED Notes (Signed)
Pt resting quietly with eyes closed. Attached to cardiac monitor x3. VSS.

## 2021-10-17 NOTE — ED Notes (Signed)
Hospitalist at the bedside 

## 2021-10-17 NOTE — ED Notes (Signed)
Pt asking for meds for headache nurse is aware.

## 2021-10-17 NOTE — Evaluation (Signed)
Occupational Therapy Evaluation Patient Details Name: Sonya Duffy MRN: [id number] DOB: 1946-08-25 Today's Date: 10/17/2021   History of Present Illness Sonya Duffy is a 75 yo female who presented with right leg weakness with increased difficulty walking and daughter reported slurred speech. MRI positive for acute infarcts in Lt pons. PMH: CVA, DMII, HTN, HLD, NSTEMI   Clinical Impression   PTA, pt was living with daughter, participating in [facility] and PT, and requiring assistance for ADLs with use of RW. Upon evaluation, pt with decreased functional strength (increased deficits on RUE vs LUE), RUE bradykinesia, and balance limiting functional abilities. Pt currently requires Max A for LB ADLs, mod A for LB ADLs, and mod A for sit to stands with RW. Patient will benefit from skilled OT services while in hospital to improve deficits and learn compensatory strategies as needed in order to return to PLOF. Pt's daughter reports her cognition is close to baseline, but she has noticed what she thinks is residual deficits from medication. Pt motivated and pleasant. Due to caregiver and pt preference and pt current level of function, recommending SNF rehab.       Recommendations for follow up therapy are one component of a multi-disciplinary discharge planning process, led by the attending physician.  Recommendations may be updated based on patient status, additional functional criteria and insurance authorization.   Follow Up Recommendations  Skilled nursing-short term rehab (<3 hours/day)    Assistance Recommended at Discharge    Functional Status Assessment  Patient has had a recent decline in their functional status and demonstrates the ability to make significant improvements in function in a reasonable and predictable amount of time.  Equipment Recommendations  Other (comment) (TBD)    Recommendations for Other Services       Precautions / Restrictions Precautions Precautions:  Fall Restrictions Weight Bearing Restrictions: No      Mobility Bed Mobility Overal bed mobility: Needs Assistance Bed Mobility: Supine to Sit Rolling: Modified independent (Device/Increase time)   Supine to sit: Mod assist Sit to supine: Min assist   General bed mobility comments: required assist to power up into sitting and scoot EOB, HOB slightly raised.    Transfers Overall transfer level: Needs assistance Equipment used: Rolling walker (2 wheels) Transfers: Sit to/from Stand Sit to Stand: Mod assist;+2 safety/equipment           General transfer comment: Requires BUE support and mod multimodal cues to take steps forwards and sideways towards HOB.      Balance Overall balance assessment: Needs assistance Sitting-balance support: Feet supported;Single extremity supported Sitting balance-Leahy Scale: Fair   Standing balance support: During functional activity;Bilateral upper extremity supported;Reliant on assistive device for balance Standing balance-Leahy Scale: Poor Standing balance comment: requires BUE external support.                           ADL either performed or assessed with clinical judgement   ADL Overall ADL's : Needs assistance/impaired Eating/Feeding: Set up;Sitting   Grooming: Minimal assistance;Sitting   Upper Body Bathing: Sitting;Moderate assistance   Lower Body Bathing: Maximal assistance;Sit to/from stand   Upper Body Dressing : Sitting;Moderate assistance   Lower Body Dressing: Maximal assistance;Sit to/from stand   Toilet Transfer: Moderate assistance;BSC/3in1   Toileting- Clothing Manipulation and Hygiene: Maximal assistance;Sit to/from stand       Functional mobility during ADLs: Moderate assistance;+2 for safety/equipment       Vision Patient Visual Report: No change from baseline  Perception     Praxis      Pertinent Vitals/Pain Pain Assessment: No/denies pain Pain Score: 5  Pain Location: R  lower leg and foot Pain Descriptors / Indicators: Tender;Aching;Sore Pain Intervention(s): Limited activity within patient's tolerance;Monitored during session     Hand Dominance Right   Extremity/Trunk Assessment Upper Extremity Assessment Upper Extremity Assessment: RUE deficits/detail;LUE deficits/detail RUE Deficits / Details: MMT 3+/5 shoulder flexion, 4-/5 elbow extension/flexion, grip strength. Past shoulder surgery, shoulder flexion 3/4 ROM RUE Coordination:  (bradykinesia) LUE Deficits / Details: MMT 4/5 shoulder flexion, 4-/5 elbow extension/flexion, 5/5 grip strength      Cervical / Trunk Assessment Cervical / Trunk Assessment: Kyphotic   Communication Communication Communication: No difficulties   Cognition Arousal/Alertness: Awake/alert Behavior During Therapy: WFL for tasks assessed/performed Overall Cognitive Status: Within Functional Limits for tasks assessed                                 General Comments: daugher reports residual effects of medications since in hospital affecting cognition.     General Comments  HR up to 113 with transfer    Exercises     Shoulder Instructions      Home Living Family/patient expects to be discharged to:: Private residence Living Arrangements: Children Available Help at Discharge: Family;Available 24 hours/day Type of Home: House Home Access: Stairs to enter [employer] of Steps: 4 Entrance Stairs-Rails:  ("I don't remember") Home Layout: One level     Bathroom Shower/Tub: Occupational psychologist: Standard     Home Equipment: Conservation officer, nature (2 wheels)   Additional Comments: previously hospitalized in september. reports stroke like symptoms started this past friday.      Prior Functioning/Environment Prior Level of Function : Needs assist       Physical Assist : Mobility (physical);ADLs (physical) Mobility (physical): Bed mobility;Transfers;Gait;Stairs ADLs (physical):  Bathing;Dressing;Toileting;IADLs Mobility Comments: pt reports using RW for household ambulation, daughters assists as needed with transfers, active with HHPT ADLs Comments: pt reports daughter assists as needed with bathing, dressing, toileting. HHOT was working on combing hair, washing self, could dress self aside from getting new catheter. Was sponge bathing wiht assist with daughter.        OT Problem List: Decreased strength;Decreased activity tolerance;Impaired balance (sitting and/or standing);Decreased safety awareness;Decreased knowledge of use of DME or AE;Decreased knowledge of precautions;Impaired UE functional use;Decreased coordination      OT Treatment/Interventions: Self-care/ADL training;Therapeutic exercise;Neuromuscular education;DME and/or AE instruction;Therapeutic activities;Patient/family education;Balance training    OT Goals(Current goals can be found in the care plan section) Acute Rehab OT Goals Patient Stated Goal: To get stronger OT Goal Formulation: With patient Time For Goal Achievement: 10/31/21 Potential to Achieve Goals: Good  OT Frequency: Min 2X/week   Barriers to D/C:            Co-evaluation              AM-PAC OT "6 Clicks" Daily Activity     Outcome Measure Help from another person eating meals?: A Little Help from another person taking care of personal grooming?: A Little Help from another person toileting, which includes using toliet, bedpan, or urinal?: A Lot Help from another person bathing (including washing, rinsing, drying)?: A Lot Help from another person to put on and taking off regular upper body clothing?: A Little Help from another person to put on and taking off regular lower body clothing?: A Lot 6  Click Score: 15   End of Session Nurse Communication: Mobility status  Activity Tolerance: Patient tolerated treatment well Patient left: in bed;with bed alarm set;with call bell/phone within reach;with family/visitor  present  OT Visit Diagnosis: Muscle weakness (generalized) (M62.81);Unsteadiness on feet (R26.81);Other abnormalities of gait and mobility (R26.89)                Time: 3403-5248 OT Time Calculation (min): 20 min Charges:  OT General Charges $OT Visit: 1 Visit OT Evaluation $OT Eval Moderate Complexity: 1 Mod  Jackquline Denmark, OTS Acute Rehab Office: [phone]   Sonya Duffy 10/17/2021, 4:35 PM

## 2021-10-17 NOTE — ED Notes (Signed)
Breakfast tray given. °

## 2021-10-17 NOTE — Evaluation (Addendum)
Physical Therapy Evaluation Patient Details Name: Sonya Duffy MRN: [id number] DOB: 29-May-1946 Today's Date: 10/17/2021  History of Present Illness  Sonya Duffy is a 75 yo female who presented with right leg weakness with increased difficulty walking and daughter reported slurred speech. MRI positive for acute infarcts in Lt pons. PMH: CVA, DMII, HTN, HLD, NSTEMI   Clinical Impression  Pt admitted with above diagnosis. Pt from home, received help from daughter for transfers, ambulation and self care tasks, also active with HHPT working on balance. Pt currently requiring mod A with supine<>sit and transfers, posterior lean in standing and R lateral lean in sitting and standing. Pt posterior and R lean along with fear of falling making it difficulty for standing marching and sidestepping at EOB. Pt able to improve trunk lean in sitting when cued, but slowly returns to leaning position. Pt BLE symmetrical with MMT, pt states past strokes so she feels weak all over. Pt tired and fearful of falling, educated pt on time OOB but pt too tired and requests to take a nap in bed. Pt has good family support, daughter works from home so is there for pt 24/7. Pt may benefit from CIR prior to return home with daughter to assist. Pt currently with functional limitations due to the deficits listed below (see PT Problem List). Pt will benefit from skilled PT to increase their independence and safety with mobility to allow discharge to the venue listed below.          Recommendations for follow up therapy are one component of a multi-disciplinary discharge planning process, led by the attending physician.  Recommendations may be updated based on patient status, additional functional criteria and insurance authorization.  Follow Up Recommendations Acute inpatient rehab (3hours/day)    Assistance Recommended at Discharge    Functional Status Assessment Patient has had a recent decline in their functional status and  demonstrates the ability to make significant improvements in function in a reasonable and predictable amount of time.  Equipment Recommendations  None recommended by PT    Recommendations for Other Services       Precautions / Restrictions Precautions Precautions: Fall Restrictions Weight Bearing Restrictions: No      Mobility  Bed Mobility Overal bed mobility: Needs Assistance Bed Mobility: Supine to Sit;Sit to Supine;Rolling Rolling: Modified independent (Device/Increase time)  Supine to sit: Mod assist Sit to supine: Min assist  General bed mobility comments: pt mobilizes BLE over to EOB and mod A to upright trunk and to prevent LOB R; min A to lift BLE back into bed; rolling R and L mod I with use of bedrail    Transfers Overall transfer level: Needs assistance Equipment used: Rolling walker (2 wheels) Transfers: Sit to/from Stand Sit to Stand: Mod assist  General transfer comment: pt braces BLE against bed, maintains weight posterior with toes slightly elevated off of floor despite VCs, slight trunk lean R that improves with VCs    Ambulation/Gait  General Gait Details: posterior lean and slight R lateral lean with RW in standing, pt fearful of falling, difficulty clearing each feet from floor for sidesteps or static marching, pt reports "I just need to rest" so assisted back to supine  Stairs            Wheelchair Mobility    Modified Rankin (Stroke Patients Only)       Balance Overall balance assessment: Needs assistance Sitting-balance support: Feet supported;Single extremity supported Sitting balance-Leahy Scale: Poor Sitting balance - Comments: assisted  to upright sitting with slow lean towards R able to return to midline with verbal cues, but then returns slowly to R lateral lean, requires BUE to prop on Postural control: Right lateral lean Standing balance support: During functional activity;Bilateral upper extremity supported;Reliant on assistive  device for balance Standing balance-Leahy Scale: Poor Standing balance comment: RW with mod A for static standing, posterior lean with slight R lateral lean          Pertinent Vitals/Pain Pain Assessment: 0-10 Pain Score: 5  Pain Location: R lower leg and foot Pain Descriptors / Indicators: Tender;Aching;Sore Pain Intervention(s): Limited activity within patient's tolerance;Monitored during session    [place] expects to be discharged to:: Private residence Living Arrangements: Children Available Help at Discharge: Family;Available 24 hours/day Type of Home: House Home Access: Stairs to enter Entrance Stairs-Rails:  ("I don't remember") Entrance Stairs-Number of Steps: 4   Home Layout: One level Home Equipment: Conservation officer, nature (2 wheels)      Prior Function Prior Level of Function : Needs assist  Physical Assist : Mobility (physical);ADLs (physical) Mobility (physical): Bed mobility;Transfers;Gait;Stairs ADLs (physical): Bathing;Dressing;Toileting;IADLs Mobility Comments: pt reports using RW for household ambulation, daughters assists as needed with transfers, active with HHPT ADLs Comments: pt reports daughter assists as needed with bathing, dressing, toileting     Hand Dominance        Extremity/Trunk Assessment   Upper Extremity Assessment Upper Extremity Assessment: Defer to OT evaluation    Lower Extremity Assessment Lower Extremity Assessment: RLE deficits/detail;LLE deficits/detail RLE Deficits / Details: AROM WFL, ankle PF/DF 3+/5, knee flex/ext 3+/5, hip flex 3/5, hip abd/add 3+/5, tender to the touch circumferentially distal to knee but denies numbness/tingling RLE Coordination: WNL LLE Deficits / Details: AROM WFL, ankle PF/DF 3+/5, knee flex/ext 3+/5, hip flex3/5,  hip abd/add 3+/5 LLE Coordination: WNL    Cervical / Trunk Assessment Cervical / Trunk Assessment: Kyphotic  Communication   Communication: No difficulties (slightly  mumbled at times)  Cognition Arousal/Alertness: Awake/alert Behavior During Therapy: WFL for tasks assessed/performed Overall Cognitive Status: Within Functional Limits for tasks assessed  General Comments: pt alert and oriented to self, DOB, age, and location. States month is "the one with Thanksgiving" and year "I don't keep up with that". States current president correctly.        General Comments General comments (skin integrity, edema, etc.): HR up to 113 with transfer    Exercises     Assessment/Plan    PT Assessment Patient needs continued PT services  PT Problem List Decreased strength;Decreased activity tolerance;Decreased balance;Decreased mobility;Pain       PT Treatment Interventions DME instruction;Gait training;Stair training;Functional mobility training;Therapeutic activities;Therapeutic exercise;Balance training;Neuromuscular re-education;Patient/family education    PT Goals (Current goals can be found in the Care Plan section)  Acute Rehab PT Goals Patient Stated Goal: "I don't want to go to no nursing home" PT Goal Formulation: With patient Time For Goal Achievement: 10/31/21 Potential to Achieve Goals: Good    Frequency     Barriers to discharge        Co-evaluation               AM-PAC PT "6 Clicks" Mobility  Outcome Measure Help needed turning from your back to your side while in a flat bed without using bedrails?: None Help needed moving from lying on your back to sitting on the side of a flat bed without using bedrails?: A Lot Help needed moving to and from a bed to a chair (including  a wheelchair)?: A Lot Help needed standing up from a chair using your arms (e.g., wheelchair or bedside chair)?: A Lot Help needed to walk in hospital room?: A Lot Help needed climbing 3-5 steps with a railing? : Total 6 Click Score: 13    End of Session Equipment Utilized During Treatment: Gait belt Activity Tolerance: Patient tolerated treatment  well;Patient limited by fatigue Patient left: in bed;with call bell/phone within reach;with bed alarm set Nurse Communication: Mobility status PT Visit Diagnosis: Other abnormalities of gait and mobility (R26.89);Muscle weakness (generalized) (M62.81);Other symptoms and signs involving the nervous system (R29.898)    Time: 2637-8588 PT Time Calculation (min) (ACUTE ONLY): 24 min   Charges:   PT Evaluation $PT Eval Low Complexity: 1 Low PT Treatments $Therapeutic Activity: 8-22 mins         Tori Chike Farrington PT, DPT 10/17/21, 2:30 PM

## 2021-10-17 NOTE — ED Notes (Signed)
Report sent to floor nurse.  

## 2021-10-17 NOTE — ED Notes (Signed)
Coag blood work walked to lab by ED tech at this time. Lab notified and aware.

## 2021-10-17 NOTE — Progress Notes (Signed)
Inpatient Rehab Admissions Coordinator:  ? ?Per PT recommendation,  patient was screened for CIR candidacy by Carolena Fairbank, MS, CCC-SLP. At this time, Pt. Appears to be a a potential candidate for CIR. I will place order for rehab consult per protocol for full assessment. Please contact me any with questions. ? ?Shubham Thackston, MS, CCC-SLP ?Rehab Admissions Coordinator  ?[phone] (celll) ?[phone] (office) ? ?

## 2021-10-17 NOTE — Progress Notes (Signed)
Progress Note    Sonya Duffy   [id number]  DOB: 1946/07/21  DOA: 10/15/2021     2 PCP: Elwyn Reach, MD  Initial CC: right side weakness  Hospital Course: Ms. Sonya Duffy is a 75 yo female with PMH CVA, DMII, HTN, HLD who presented with right leg weakness with increased difficulty walking due to this.  Her daughter had also reported slurred speech on admission. Initial CT head showed no acute abnormality with stable atrophy and chronic small vessel ischemic changes. MRI brain then showed acute infarcts involving the left pons and underlying unchanged 1.5 cm right frontotemporal convexity meningioma.  Interval History:  Patient more awake and alert this morning.  No events overnight.  She is amenable for rehab if recommended after evaluation by therapy today. Still has ongoing right-sided weakness.  Assessment & Plan: * Acute ischemic stroke [facility]) Noncontrast MRI imaging of the brain revealing multiple infarcts in the left pons On exam patient still exhibits the following deficit: Right lower extremity weakness, slurred speech and difficulty with ambulation Other considered etiology on admission was possible emboli given recent history of MSSA bacteremia in Sept 2022 (follow up repeat blood cx) Continue asa (dose increased per neuro) and plavix. Follow up p2y12 inhibitor (send off) Therapy rec's are for SNF vs CIR. Await CIR evaluation for candidacy  Urinary retention Recently diagnosed 10/21  Now with indwelling foley catheter daily Nitrofurantoin for suppressive therapy  Essential hypertension On losartan at home BP currently at goal without antihypertensives.  Will resume as BP needed  Mixed diabetic hyperlipidemia associated with type 2 diabetes mellitus ([place]) Continuing lipitor LDL 65 on 10/16/21  Coronary artery disease involving native coronary artery of native heart without angina pectoris Patient is currently chest pain free Monitoring patient on  telemetry EKG reveals no evidence of ST segment change Patient is already on antiplatelet therapy, lipid-lowering therapy.   We will resume AV nodal blocking therapy once able.  Type 2 diabetes mellitus with hyperglycemia, with long-term current use of insulin (HCC) A1c 9% Continue SSI and CBG Continue semglee     Old records reviewed in assessment of this patient  Antimicrobials: Nitrofurantoin   DVT prophylaxis: Lovenox  Code Status:   Code Status: Full Code  Disposition Plan: Status is: Inpatient  Remains inpatient appropriate because: treatment of above   Objective: Blood pressure 115/63, pulse 88, temperature (!) 97.5 F (36.4 C), temperature source Oral, resp. rate 20, height 5\' 2"  (1.575 m), weight 57.7 kg, SpO2 98 %.  Examination: Physical Exam Constitutional:      General: She is not in acute distress.    Appearance: Normal appearance.  HENT:     Head: Normocephalic and atraumatic.     Mouth/Throat:     Mouth: Mucous membranes are moist.  Eyes:     Extraocular Movements: Extraocular movements intact.  Cardiovascular:     Rate and Rhythm: Normal rate and regular rhythm.  Pulmonary:     Effort: Pulmonary effort is normal.     Breath sounds: Normal breath sounds.  Abdominal:     General: Bowel sounds are normal. There is no distension.     Palpations: Abdomen is soft.     Tenderness: There is no abdominal tenderness.  Musculoskeletal:        General: Normal range of motion.     Cervical back: Normal range of motion and neck supple.  Skin:    General: Skin is warm and dry.  Neurological:     Mental Status:  She is alert.     Comments: 4/5 RUE/RLE, no dysmetria. Dysarthria is present     Consultants:  Neuro  Procedures:    Data Reviewed: I have personally reviewed labs and imaging studies     LOS: 2 days  Time spent: Greater than 50% of the 35 minute visit was spent in counseling/coordination of care for the patient as laid out in the A&P.    Dwyane Dee, MD [facility] 10/17/2021, 5:15 PM

## 2021-10-18 LAB — GLUCOSE, CAPILLARY
Glucose-Capillary: 116 mg/dL — ABNORMAL HIGH (ref 70–99)
Glucose-Capillary: 177 mg/dL — ABNORMAL HIGH (ref 70–99)
Glucose-Capillary: 265 mg/dL — ABNORMAL HIGH (ref 70–99)
Glucose-Capillary: 189 mg/dL — ABNORMAL HIGH (ref 70–99)

## 2021-10-18 LAB — PLATELET INHIBITION P2Y12: Platelet Function  P2Y12: 252 [PRU] (ref 182–335)

## 2021-10-18 MED ORDER — ASPIRIN EC 81 MG PO TBEC
81.0000 mg | DELAYED_RELEASE_TABLET | Freq: Every day | ORAL | Status: DC
  Administered 2021-10-19: 81 mg via ORAL
  Filled 2021-10-18: qty 1

## 2021-10-18 MED ORDER — TICAGRELOR 90 MG PO TABS
90.0000 mg | ORAL_TABLET | Freq: Two times a day (BID) | ORAL | Status: DC
  Administered 2021-10-18 – 2021-10-19 (×4): 90 mg via ORAL
  Filled 2021-10-18 (×4): qty 1

## 2021-10-18 MED ORDER — MELATONIN 3 MG PO TABS
6.0000 mg | ORAL_TABLET | Freq: Every day | ORAL | Status: DC
  Administered 2021-10-18 – 2021-10-19 (×2): 6 mg via ORAL
  Filled 2021-10-18 (×2): qty 2

## 2021-10-18 MED ORDER — DM-GUAIFENESIN ER 30-600 MG PO TB12
1.0000 | ORAL_TABLET | Freq: Two times a day (BID) | ORAL | Status: DC
  Administered 2021-10-18 – 2021-10-19 (×4): 1 via ORAL
  Filled 2021-10-18 (×4): qty 1

## 2021-10-18 NOTE — Progress Notes (Signed)
Progress Note    Sonya Duffy   [id number]  DOB: [birth year]  DOA: 10/15/2021     3 PCP: Elwyn Reach, MD  Initial CC: right side weakness  Hospital Course: Sonya Duffy is a 75 yo female with PMH CVA, DMII, HTN, HLD who presented with right leg weakness with increased difficulty walking due to this.  Her daughter had also reported slurred speech on admission. Initial CT head showed no acute abnormality with stable atrophy and chronic small vessel ischemic changes. MRI brain then showed acute infarcts involving the left pons and underlying unchanged 1.5 cm right frontotemporal convexity meningioma.  Interval History:  No events overnight.  Resting in bed comfortably when seen this morning.  Discussed our transition from Plavix to ticagrelor and explained reasoning to patient.  Also understands we are awaiting rehab placement at this time.  Assessment & Plan: * Acute ischemic stroke ([place]) - Noncontrast MRI imaging of the brain revealing multiple infarcts in the left pons - On exam patient still exhibits the following deficit: Right lower extremity weakness, slurred speech and difficulty with ambulation - Other considered etiology on admission was possible emboli given recent history of MSSA bacteremia in Sept 2022 (follow up repeat blood cx) - Continue asa (dose increased per neuro) - p2y12 level is 252, consistent with Plavix nonresponder.  Discussed with neurology, recommended to transition patient to ticagrelor -Daughter requesting SNF placement.  CIR eval canceled   Urinary retention Recently diagnosed 10/21  Now with indwelling foley catheter daily Nitrofurantoin for suppressive therapy  Essential hypertension On losartan at home BP currently at goal without antihypertensives.  Will resume as BP needed  Mixed diabetic hyperlipidemia associated with type 2 diabetes mellitus ([place]) Continuing lipitor LDL 65 on 10/16/21   Coronary artery disease involving native  coronary artery of native heart without angina pectoris Patient is currently chest pain free Monitoring patient on telemetry EKG reveals no evidence of ST segment change Patient is already on antiplatelet therapy, lipid-lowering therapy.   We will resume AV nodal blocking therapy once able.    Type 2 diabetes mellitus with hyperglycemia, with long-term current use of insulin (HCC) A1c 9% Continue SSI and CBG Continue semglee    Old records reviewed in assessment of this patient  Antimicrobials: Nitrofurantoin   DVT prophylaxis: Lovenox  Code Status:   Code Status: Full Code  Disposition Plan: Status is: Inpatient  Remains inpatient appropriate because: awaiting SNF placement   Objective: Blood pressure (!) 152/72, pulse 84, temperature (!) 97.4 F (36.3 C), temperature source Oral, resp. rate 16, height 5\' 2"  (1.575 m), weight 57.7 kg, SpO2 95 %.  Examination: Physical Exam Constitutional:      General: She is not in acute distress.    Appearance: Normal appearance.  HENT:     Head: Normocephalic and atraumatic.     Mouth/Throat:     Mouth: Mucous membranes are moist.  Eyes:     Extraocular Movements: Extraocular movements intact.  Cardiovascular:     Rate and Rhythm: Normal rate and regular rhythm.  Pulmonary:     Effort: Pulmonary effort is normal.     Breath sounds: Normal breath sounds.  Abdominal:     General: Bowel sounds are normal. There is no distension.     Palpations: Abdomen is soft.     Tenderness: There is no abdominal tenderness.  Musculoskeletal:        General: Normal range of motion.     Cervical back: Normal range of motion  and neck supple.  Skin:    General: Skin is warm and dry.  Neurological:     Mental Status: She is alert.     Comments: 4/5 RUE/RLE, no dysmetria. Dysarthria is present     Consultants:  Neuro - signed off  Procedures:    Data Reviewed: I have personally reviewed labs and imaging studies     LOS: 3 days   Time spent: Greater than 50% of the 35 minute visit was spent in counseling/coordination of care for the patient as laid out in the A&P.   Dwyane Dee, MD [facility] 10/18/2021, 3:29 PM

## 2021-10-18 NOTE — TOC Progression Note (Signed)
Transition of Care [facility]) - Progression Note    Patient Details  Name: Sonya Duffy MRN: [id number] Date of Birth: [birth year]  [facility]) CM/SW Contact  Aamna Mallozzi, Juliann Pulse, RN Phone Number: 10/18/2021, 1:57 PM  Clinical Narrative: Damaris Schooner to patient/dtr [name] about d/c plans-agree to SNF-faxed out await bed offers.Will need auth, & covid-MD notified. Barbara aware of d/c plan for tomorrow if she chooses SNF early in am so we can get auth, & hopeful for d/c tomorrow late. MD to order covid today.     Expected Discharge Plan: [place] Barriers to Discharge: Continued Medical Work up  Expected Discharge Plan and Services Expected Discharge Plan: [place]   Discharge Planning Services: CM Consult   Living arrangements for the past 2 months: Single Family Home                                       Social Determinants of Health (SDOH) Interventions    Readmission Risk Interventions No flowsheet data found.

## 2021-10-18 NOTE — TOC Initial Note (Signed)
Transition of Care [facility]) - Initial/Assessment Note    Patient Details  Name: Sonya Duffy MRN: [id number] Date of Birth: Jul 11, 1946  Transition of Care [facility]) CM/SW Contact:    Dessa Phi, RN Phone Number: 10/18/2021, 10:12 AM  Clinical Narrative: PT recc CIR-CIR to eval-await recc.                  Expected Discharge Plan: IP Rehab Facility Barriers to Discharge: Continued Medical Work up   Patient Goals and CMS Choice Patient states their goals for this hospitalization and ongoing recovery are:: go home CMS Medicare.gov Compare Post Acute Care list provided to:: Patient Choice offered to / list presented to : Patient  Expected Discharge Plan and Services Expected Discharge Plan: [place]   Discharge Planning Services: CM Consult   Living arrangements for the past 2 months: Single Family Home                                      Prior Living Arrangements/Services Living arrangements for the past 2 months: Single Family Home Lives with:: Adult Children Patient language and need for interpreter reviewed:: Yes Do you feel safe going back to the place where you live?: Yes      Need for Family Participation in Patient Care: No (Comment) Care giver support system in place?: Yes (comment)   Criminal Activity/Legal Involvement Pertinent to Current Situation/Hospitalization: No - Comment as needed  Activities of Daily Living Home Assistive Devices/Equipment: Eyeglasses, Gilford Rile (specify type), Hospital bed, Bedside commode/3-in-1 (indwelling catheter) ADL Screening (condition at time of admission) Patient's cognitive ability adequate to safely complete daily activities?: No Is the patient deaf or have difficulty hearing?: No Does the patient have difficulty seeing, even when wearing glasses/contacts?: No Does the patient have difficulty concentrating, remembering, or making decisions?: Yes (has onset dementia per daughter) Patient able to express need  for assistance with ADLs?: Yes Does the patient have difficulty dressing or bathing?: Yes Independently performs ADLs?: No Communication: Independent Dressing (OT): Needs assistance Is this a change from baseline?: Pre-admission baseline Grooming: Needs assistance Is this a change from baseline?: Pre-admission baseline Feeding: Independent Bathing: Needs assistance Is this a change from baseline?: Pre-admission baseline Toileting: Needs assistance Is this a change from baseline?: Pre-admission baseline In/Out Bed: Needs assistance Is this a change from baseline?: Pre-admission baseline Walks in Home: Dependent Is this a change from baseline?: Change from baseline, expected to last >3 days Does the patient have difficulty walking or climbing stairs?: Yes Weakness of Legs: Right Weakness of Arms/Hands: Right  Permission Sought/Granted Permission sought to share information with : Case Manager Permission granted to share information with : Yes, Verbal Permission Granted  Share Information with NAME: Case Manager     Permission granted to share info w Relationship: [name] dtr 336 [phone]     Emotional Assessment Appearance:: Appears stated age Attitude/Demeanor/Rapport: Gracious Affect (typically observed): Accepting Orientation: : Oriented to Self, Oriented to Place, Oriented to  Time, Oriented to Situation      Admission diagnosis:  CVA (cerebral vascular accident) ([place]) [I63.9] Stroke [facility]) [I63.9] Cerebrovascular accident (CVA), unspecified mechanism ([place]) [I63.9] Patient Active Problem List   Diagnosis Date Noted   Pressure injury of skin 10/17/2021   Urinary retention 10/16/2021   CVA (cerebral vascular accident) ([place]) 10/16/2021   Acute ischemic stroke ([place]) 10/15/2021   Type 2 diabetes mellitus with hyperglycemia, with long-term  current use of insulin ([place]) 10/15/2021   Coronary artery disease involving native coronary artery of native heart without angina  pectoris 10/15/2021   Mixed diabetic hyperlipidemia associated with type 2 diabetes mellitus ([place]) 10/15/2021   Essential hypertension 10/15/2021   Acute vaginitis    Oral thrush    MSSA bacteremia    Non-ST elevation (NSTEMI) myocardial infarction ([place]) 08/29/2021   Cerebral thrombosis with cerebral infarction 08/28/2021   DKA (diabetic ketoacidosis) ([place]) 08/27/2021   PCP:  Elwyn Reach, MD Pharmacy:   [facility] [place] [place] [address] [facility] [address] [place] [address] Phone: [phone] Fax: [phone]     Social Determinants of Health (SDOH) Interventions    Readmission Risk Interventions No flowsheet data found.

## 2021-10-18 NOTE — Progress Notes (Signed)
[facility] IP rehab admissions - I spoke with patient's daughter.  Daughter prefers SNF placement.  Dtr does not feel that she can manage at home even after a short rehab stay.  Dtr has no other caregiver assistance and Dtr works fulltime from her home.  Dtr is afraid that patient will fall, have more strokes, etc and dtr does not feel that she can manage at home.  Agree after discussion with dtr that patient will need SNF placement.  Call for questions.  [phone]

## 2021-10-18 NOTE — Progress Notes (Signed)
Physical Therapy Treatment Patient Details Name: Sonya Duffy MRN: [id number] DOB: [birth year] Today's Date: 10/18/2021   History of Present Illness Sonya Duffy is a 75 yo female who presented with right leg weakness with increased difficulty walking and daughter reported slurred speech. MRI positive for acute infarcts in Lt pons. PMH: CVA, DMII, HTN, HLD, NSTEMI    PT Comments    Pt assisted with sitting and attempted to hold midline posture.  Pt tends to have right lateral lean however more significant when standing.  Pt with difficulty weight shifting to midline in standing.  Pt assisted with sit to stands and then step-pivot over to recliner to stay OOB in recliner end of session.  Per notes, family wishes for SNF upon d/c so updated recommendations as pt still would benefit from post acute rehab prior to home.    Recommendations for follow up therapy are one component of a multi-disciplinary discharge planning process, led by the attending physician.  Recommendations may be updated based on patient status, additional functional criteria and insurance authorization.  Follow Up Recommendations  Skilled nursing-short term rehab (<3 hours/day)     Assistance Recommended at Discharge Frequent or constant Supervision/Assistance  Equipment Recommendations  None recommended by PT    Recommendations for Other Services       Precautions / Restrictions Precautions Precautions: Fall Precaution Comments: Rt lean, weight shift     Mobility  Bed Mobility Overal bed mobility: Needs Assistance Bed Mobility: Rolling;Sidelying to Sit Rolling: Modified independent (Device/Increase time) Sidelying to sit: Min assist       General bed mobility comments: assist to raise trunk, increased time and effort especially with scooting to EOB    Transfers Overall transfer level: Needs assistance Equipment used: Rolling walker (2 wheels) Transfers: Sit to/from Stand;Bed to  chair/wheelchair/BSC Sit to Stand: Mod assist;+2 safety/equipment     Step pivot transfers: Mod assist;+2 safety/equipment     General transfer comment: Requires BUE support and mod multimodal cues especially for weight shifting; pt with heavy weight shift to right upon standing and not able to correct despite multimodal cues (may perform better with mirror); sit to stand x3 and then another to then take small steps over to recliner    Ambulation/Gait                   Stairs             Wheelchair Mobility    Modified Rankin (Stroke Patients Only)       Balance Overall balance assessment: Needs assistance Sitting-balance support: Bilateral upper extremity supported;Feet supported Sitting balance-Leahy Scale: Poor Sitting balance - Comments: pt reliant on UE support, improved ability to hold midline today however does lean towards right with fatigue and able to correct with cues; attempted to maintain midline posture approx 5 minutes Postural control: Right lateral lean Standing balance support: During functional activity;Bilateral upper extremity supported;Reliant on assistive device for balance Standing balance-Leahy Scale: Zero Standing balance comment: requires BUE support and external support.                            Cognition Arousal/Alertness: Awake/alert Behavior During Therapy: WFL for tasks assessed/performed                                            Exercises  General Comments        Pertinent Vitals/Pain Pain Assessment: Faces Faces Pain Scale: Hurts a little bit Pain Location: reports chronic pain in knees and left shoulder Pain Descriptors / Indicators: Tender;Aching;Sore Pain Intervention(s): Repositioned;Monitored during session    Home Living                          Prior Function            PT Goals (current goals can now be found in the care plan section) Progress towards PT  goals: Progressing toward goals    Frequency           PT Plan Discharge plan needs to be updated    Co-evaluation              AM-PAC PT "6 Clicks" Mobility   Outcome Measure  Help needed turning from your back to your side while in a flat bed without using bedrails?: None Help needed moving from lying on your back to sitting on the side of a flat bed without using bedrails?: A Lot Help needed moving to and from a bed to a chair (including a wheelchair)?: A Lot Help needed standing up from a chair using your arms (e.g., wheelchair or bedside chair)?: A Lot Help needed to walk in hospital room?: Total Help needed climbing 3-5 steps with a railing? : Total 6 Click Score: 12    End of Session Equipment Utilized During Treatment: Gait belt Activity Tolerance: Patient tolerated treatment well;Patient limited by fatigue Patient left: in chair;with call bell/phone within reach;with chair alarm set Nurse Communication: Mobility status PT Visit Diagnosis: Other abnormalities of gait and mobility (R26.89);Muscle weakness (generalized) (M62.81);Other symptoms and signs involving the nervous system (R29.898)     Time: 7654-6503 PT Time Calculation (min) (ACUTE ONLY): 17 min  Charges:  $Therapeutic Activity: 8-22 mins                    Jannette Spanner PT, DPT Acute Rehabilitation Services Pager: [phone] Office: [place] 10/18/2021, 1:31 PM

## 2021-10-18 NOTE — NC FL2 (Signed)
[place] LEVEL OF CARE SCREENING TOOL     IDENTIFICATION  Patient Name: Sonya Duffy Birthdate: March 16, 1946 Sex: female Admission Date (Current Location): 10/15/2021  [facility] and [place] Number:  Herbalist and Address:  [facility],  [place], [place]      Provider Number: [id number]  Attending Physician Name and Address:  Dwyane Dee, MD  Relative Name and Phone Number:  Katy Fitch dtr [phone]    Current Level of Care: Hospital Recommended Level of Care: [place] Prior Approval Number:    Date Approved/Denied:   PASRR Number: [id number]  Discharge Plan: SNF    Current Diagnoses: Patient Active Problem List   Diagnosis Date Noted   Pressure injury of skin 10/17/2021   Urinary retention 10/16/2021   CVA (cerebral vascular accident) ([place]) 10/16/2021   Acute ischemic stroke ([place]) 10/15/2021   Type 2 diabetes mellitus with hyperglycemia, with long-term current use of insulin ([place]) 10/15/2021   Coronary artery disease involving native coronary artery of native heart without angina pectoris 10/15/2021   Mixed diabetic hyperlipidemia associated with type 2 diabetes mellitus ([place]) 10/15/2021   Essential hypertension 10/15/2021   Acute vaginitis    Oral thrush    MSSA bacteremia    Non-ST elevation (NSTEMI) myocardial infarction ([place]) 08/29/2021   Cerebral thrombosis with cerebral infarction 08/28/2021   DKA (diabetic ketoacidosis) ([place]) 08/27/2021    Orientation RESPIRATION BLADDER Height & Weight     Self, Time, Situation, Place  Normal Indwelling catheter Weight: 57.7 kg Height:  5\' 2"  (157.5 cm)  BEHAVIORAL SYMPTOMS/MOOD NEUROLOGICAL BOWEL NUTRITION STATUS      Continent Diet (Heart Healthy)  AMBULATORY STATUS COMMUNICATION OF NEEDS Skin   Limited Assist Verbally PU Stage and Appropriate Care     PU Stage 3 Dressing: TID (pressury injury buttock foam)                  Personal Care Assistance Level of Assistance  Bathing, Feeding, Dressing   Feeding assistance: Limited assistance Dressing Assistance: Limited assistance     Functional Limitations Info  Sight, Hearing, Speech Sight Info: Impaired (eyeglasses) Hearing Info: Adequate Speech Info: Impaired (slurred)    SPECIAL CARE FACTORS FREQUENCY  PT (By licensed PT), OT (By licensed OT)     PT Frequency:  (5x week) OT Frequency:  (5x week)            Contractures Contractures Info: Not present    Additional Factors Info  Code Status, Allergies, Psychotropic, Insulin Sliding Scale Code Status Info:  (Full) Allergies Info:  (Ativan) Psychotropic Info:  (Prozac see MAR) Insulin Sliding Scale Info:  (SSI)       Current Medications (10/18/2021):  This is the current hospital active medication list Current Facility-Administered Medications  Medication Dose Route Frequency Provider Last Rate Last Admin   acetaminophen (TYLENOL) tablet 650 mg  650 mg Oral Q6H PRN Vernelle Emerald, MD   650 mg at 10/17/21 1035   Or   acetaminophen (TYLENOL) suppository 650 mg  650 mg Rectal Q6H PRN Shalhoub, Sherryll Burger, MD       aspirin EC tablet 162 mg  162 mg Oral Daily Kirby-Graham, Karsten Fells, NP   162 mg at 10/18/21 1233   atorvastatin (LIPITOR) tablet 40 mg  40 mg Oral Daily Vernelle Emerald, MD   40 mg at 10/18/21 1233   Chlorhexidine Gluconate Cloth 2 % PADS 6 each  6 each Topical  Daily Dwyane Dee, MD   6 each at 10/18/21 1233   dextromethorphan-guaiFENesin ([place] DM) 30-600 MG per 12 hr tablet 1 tablet  1 tablet Oral BID Dwyane Dee, MD   1 tablet at 10/18/21 1238   enoxaparin (LOVENOX) injection 40 mg  40 mg Subcutaneous Q24H Vernelle Emerald, MD   40 mg at 10/17/21 2138   FLUoxetine (PROZAC) capsule 20 mg  20 mg Oral q morning Shalhoub, Sherryll Burger, MD   20 mg at 10/18/21 1233   insulin aspart (novoLOG) injection 0-15 Units  0-15 Units Subcutaneous TID AC & HS Shalhoub, Sherryll Burger, MD   3  Units at 10/18/21 1232   insulin glargine-yfgn (SEMGLEE) injection 15 Units  15 Units Subcutaneous QHS Vernelle Emerald, MD   15 Units at 10/17/21 2138   meclizine (ANTIVERT) tablet 25 mg  25 mg Oral BID PRN Shalhoub, Sherryll Burger, MD       melatonin tablet 6 mg  6 mg Oral QHS Dwyane Dee, MD       nitrofurantoin (MACRODANTIN) capsule 50 mg  50 mg Oral QHS Vernelle Emerald, MD   50 mg at 10/17/21 2137   ondansetron (ZOFRAN) tablet 4 mg  4 mg Oral Q6H PRN Vernelle Emerald, MD       Or   ondansetron [facility]) injection 4 mg  4 mg Intravenous Q6H PRN Shalhoub, Sherryll Burger, MD       pantoprazole (PROTONIX) EC tablet 40 mg  40 mg Oral Daily Shalhoub, Sherryll Burger, MD   40 mg at 10/18/21 1234   polyethylene glycol (MIRALAX / GLYCOLAX) packet 17 g  17 g Oral Daily PRN Vernelle Emerald, MD       ticagrelor Kary Kos) tablet 90 mg  90 mg Oral BID Dwyane Dee, MD   90 mg at 10/18/21 1238     Discharge Medications: Please see discharge summary for a list of discharge medications.  Relevant Imaging Results:  Relevant Lab Results:   Additional Information [id number]  Roneka Gilpin, Juliann Pulse, RN

## 2021-10-19 ENCOUNTER — Ambulatory Visit (HOSPITAL_COMMUNITY): Payer: Medicare Other | Attending: Cardiology

## 2021-10-19 DIAGNOSIS — I10 Essential (primary) hypertension: Secondary | ICD-10-CM

## 2021-10-19 LAB — GLUCOSE, CAPILLARY
Glucose-Capillary: 165 mg/dL — ABNORMAL HIGH (ref 70–99)
Glucose-Capillary: 326 mg/dL — ABNORMAL HIGH (ref 70–99)
Glucose-Capillary: 230 mg/dL — ABNORMAL HIGH (ref 70–99)
Glucose-Capillary: 277 mg/dL — ABNORMAL HIGH (ref 70–99)

## 2021-10-19 LAB — SARS CORONAVIRUS 2 (TAT 6-24 HRS): SARS Coronavirus 2: NEGATIVE

## 2021-10-19 MED ORDER — LOSARTAN POTASSIUM 25 MG PO TABS: 25.0000 mg | ORAL_TABLET | Freq: Every morning | ORAL | Status: DC

## 2021-10-19 MED ORDER — INSULIN GLARGINE 100 UNIT/ML ~~LOC~~ SOLN: 15.0000 [IU] | Freq: Every day | SUBCUTANEOUS | 11 refills | Status: DC

## 2021-10-19 MED ORDER — METOPROLOL TARTRATE 25 MG PO TABS: 12.5000 mg | ORAL_TABLET | Freq: Two times a day (BID) | ORAL | 2 refills | Status: AC

## 2021-10-19 MED ORDER — INSULIN ASPART 100 UNIT/ML IJ SOLN: 0.0000 [IU] | Freq: Three times a day (TID) | INTRAMUSCULAR | 11 refills | Status: AC

## 2021-10-19 MED ORDER — TICAGRELOR 90 MG PO TABS: 90.0000 mg | ORAL_TABLET | Freq: Two times a day (BID) | ORAL | Status: DC

## 2021-10-19 NOTE — Progress Notes (Signed)
Report called to nurse at [facility]. Packet ready for discharge, AVS placed inside. Pt updated. Will await transport.  Coolidge Breeze, RN 10/19/2021

## 2021-10-19 NOTE — Discharge Summary (Signed)
Physician Discharge Summary   Patient name: Sonya Duffy  Admit date:     10/15/2021  Discharge date: 10/19/2021  Discharge Physician: Dwyane Dee   PCP: Elwyn Reach, MD   Recommendations at discharge:  Follow up with urology; continue foley and nitrofurantoin Monitor BP response; meds adjusted due to low/normal BP  Discharge Diagnoses Principal Problem:   Acute ischemic stroke [facility]) Active Problems:   Urinary retention   Coronary artery disease involving native coronary artery of native heart without angina pectoris   Essential hypertension   Type 2 diabetes mellitus with hyperglycemia, with long-term current use of insulin (HCC)   Mixed diabetic hyperlipidemia associated with type 2 diabetes mellitus (HCC)   CVA (cerebral vascular accident) ([place])   Pressure injury of skin   Resolved Diagnoses Resolved Problems:   * No resolved hospital problems. [facility] Course   Sonya Duffy is a 75 yo female with PMH CVA, DMII, HTN, HLD who presented with right leg weakness with increased difficulty walking due to this.  Her daughter had also reported slurred speech on admission. Initial CT head showed no acute abnormality with stable atrophy and chronic small vessel ischemic changes. MRI brain then showed acute infarcts involving the left pons and underlying unchanged 1.5 cm right frontotemporal convexity meningioma. See below for further A&P.    * Acute ischemic stroke ([place]) - Noncontrast MRI imaging of the brain revealing multiple infarcts in the left pons - On exam patient still exhibits the following deficit: Right lower extremity weakness, slurred speech and difficulty with ambulation - Other considered etiology on admission was possible emboli given recent history of MSSA bacteremia in Sept 2022 (follow up repeat blood cx) - Continue asa - p2y12 level is 252, consistent with Plavix nonresponder.  Discussed with neurology, recommended to transition patient to  ticagrelor - plan is SNF at discharge for ongoing rehab   Urinary retention Recently diagnosed 10/21  Now with indwelling foley catheter daily Nitrofurantoin for suppressive therapy Follow up with Dr. Alinda Money, urology   Essential hypertension Losartan resumed at lower dose; continue lopressor at lower dose  Monitor for BP tolerance; may need further adjustment   Coronary artery disease involving native coronary artery of native heart without angina pectoris Patient is currently chest pain free Monitoring patient on telemetry EKG reveals no evidence of ST segment change Continue asa, statin, BB (dose lowered for low/normal BP)    Mixed diabetic hyperlipidemia associated with type 2 diabetes mellitus ([place]) Continuing lipitor LDL 65 on 10/16/21   Type 2 diabetes mellitus with hyperglycemia, with long-term current use of insulin (HCC) A1c 9% Continue SSI and CBG Continue semglee       Condition at discharge: stable  Exam Physical Exam Constitutional:      General: She is not in acute distress.    Appearance: Normal appearance.  HENT:     Head: Normocephalic and atraumatic.     Mouth/Throat:     Mouth: Mucous membranes are moist.  Eyes:     Extraocular Movements: Extraocular movements intact.  Cardiovascular:     Rate and Rhythm: Normal rate and regular rhythm.  Pulmonary:     Effort: Pulmonary effort is normal.     Breath sounds: Normal breath sounds.  Abdominal:     General: Bowel sounds are normal. There is no distension.     Palpations: Abdomen is soft.     Tenderness: There is no abdominal tenderness.  Musculoskeletal:  General: Normal range of motion.     Cervical back: Normal range of motion and neck supple.  Skin:    General: Skin is warm and dry.  Neurological:     Mental Status: She is alert.     Comments: 4/5 RUE/RLE, no dysmetria. Dysarthria is present     Disposition: Skilled nursing facility  Discharge time: greater than 30  minutes.   Allergies as of 10/19/2021       Reactions   Ativan [lorazepam] Other (See Comments)   Causes agitation, confusion and paranoia. DO NOT GIVE. Takes many hours to wash out of her system.          Medication List     STOP taking these medications    acyclovir 400 MG tablet Commonly known as: ZOVIRAX   clopidogrel 75 MG tablet Commonly known as: PLAVIX   clotrimazole 1 % cream Commonly known as: LOTRIMIN   conjugated estrogens vaginal cream Commonly known as: PREMARIN   cyanocobalamin 1000 MCG tablet   meclizine 25 MG tablet Commonly known as: ANTIVERT       TAKE these medications    acetaminophen 500 MG tablet Commonly known as: TYLENOL Take 1,000 mg by mouth every 6 (six) hours as needed for headache (pain).   aspirin EC 81 MG tablet Take 1 tablet (81 mg total) by mouth daily. Swallow whole.   atorvastatin 40 MG tablet Commonly known as: LIPITOR Take 1 tablet (40 mg total) by mouth daily.   diphenoxylate-atropine 2.5-0.025 MG tablet Commonly known as: LOMOTIL Take 1 tablet by mouth every morning.   FLUoxetine 20 MG capsule Commonly known as: PROZAC Take 20 mg by mouth every morning.   insulin aspart 100 UNIT/ML injection Commonly known as: novoLOG Inject 0-15 Units into the skin 4 (four) times daily -  before meals and at bedtime. CBG 70 - 120: 0 units  CBG 121 - 150: 2 units  CBG 151 - 200: 3 units  CBG 201 - 250: 5 units  CBG 251 - 300: 8 units  CBG 301 - 350: 11 units  CBG 351 - 400: 15 units  CBG > 400 call MD   insulin glargine 100 UNIT/ML injection Commonly known as: LANTUS Inject 0.15 mLs (15 Units total) into the skin at bedtime. What changed: how much to take   losartan 25 MG tablet Commonly known as: COZAAR Take 1 tablet (25 mg total) by mouth every morning. What changed:  medication strength how much to take   metFORMIN 1000 MG tablet Commonly known as: GLUCOPHAGE Take 1,000 mg by mouth 2 (two) times daily.    metoprolol tartrate 25 MG tablet Commonly known as: LOPRESSOR Take 0.5 tablets (12.5 mg total) by mouth 2 (two) times daily. What changed: how much to take   nitrofurantoin 50 MG capsule Commonly known as: MACRODANTIN Take 50 mg by mouth at bedtime.   pantoprazole 40 MG tablet Commonly known as: Protonix Take 1 tablet (40 mg total) by mouth daily.   ticagrelor 90 MG Tabs tablet Commonly known as: BRILINTA Take 1 tablet (90 mg total) by mouth 2 (two) times daily.               Discharge Care Instructions  (From admission, onward)           Start     Ordered   10/19/21 0000  Discharge wound care:       Comments: Continue wound care to stage 3 sacral ulcer   10/19/21 1140  CT HEAD WO CONTRAST  Result Date: 10/15/2021 CLINICAL DATA:  Slurred speech since this morning. EXAM: CT HEAD WITHOUT CONTRAST TECHNIQUE: Contiguous axial images were obtained from the base of the skull through the vertex without intravenous contrast. COMPARISON:  08/28/2021 and brain MR dated 08/28/2021. FINDINGS: Brain: Stable moderately enlarged ventricles and subarachnoid spaces. Mild-to-moderate patchy white matter low density in both cerebral hemispheres without significant change. Stable right lateral partially calcified extra-axial mass compatible with a meningioma measuring 1.6 cm in maximum diameter on image number 17/3. Stable old right cerebellar infarcts with encephalomalacia/porencephaly. More chronic appearance of the previously demonstrated left paracentral pontine infarct. No intracranial hemorrhage or new mass lesion seen. No acute areas of infarction visualized, limited with CT. Vascular: No hyperdense vessel or unexpected calcification. Skull: No hyperdense vessel or unexpected calcification. Sinuses/Orbits: Unremarkable. Other: None. IMPRESSION: 1. No acute abnormality. 2. Stable atrophy, chronic small vessel white matter ischemic changes, old infarcts and right lateral  meningioma without mass effect. Electronically Signed   By: Claudie Revering M.D.   On: 10/15/2021 11:48   MR BRAIN WO CONTRAST  Result Date: 10/15/2021 CLINICAL DATA:  Neuro deficit, stroke suspected EXAM: MRI HEAD WITHOUT CONTRAST TECHNIQUE: Multiplanar, multi-echo pulse sequences of the brain and surrounding structures were acquired without intravenous contrast. COMPARISON:  08/28/2021 MRI head FINDINGS: Evaluation is somewhat limited by motion artifact affecting several sequences. Brain: Restricted diffusion in the left and midline pons (series 9, images 64-7). These areas demonstrate ADC correlate, as well as mildly increased T2 signal, although some of this may be from a prior left pontine infarct. No acute hemorrhage, intraparenchymal mass, mass effect, or midline shift. Redemonstrated 1.5 cm extra-axial lesion along the right frontotemporal convexity (series 14, image 29), unchanged, without evidence of signal abnormality in the underlying right parenchyma size acute 6. Diffuse T2 hyperintense signal in the periventricular white matter, likely the sequela of moderate chronic small vessel ischemic disease. Redemonstrated ventriculomegaly, likely sequela of central volume loss. Remote right cerebellar infarct Vascular: Normal flow voids. Skull and upper cervical spine: Normal marrow signal. Sinuses/Orbits: No acute or significant finding. Other: Trace fluid in right mastoid air cells. IMPRESSION: 1. Restricted diffusion in the left pons, likely reflecting acute infarcts. There is mildly is increased T2 signal in this area, which is favored to be from remote left pontine infarcts. 2. Unchanged 1.5 cm right frontotemporal convexity meningioma. These results were called by telephone at the time of interpretation on 10/15/2021 at 6:50 pm to provider Andreas Ohm , who verbally acknowledged these results. Electronically Signed   By: Merilyn Baba M.D.   On: 10/15/2021 18:55   CT ABDOMEN PELVIS W CONTRAST  Result  Date: 09/28/2021 CLINICAL DATA:  Diverticulitis suspected. EXAM: CT ABDOMEN AND PELVIS WITH CONTRAST TECHNIQUE: Multidetector CT imaging of the abdomen and pelvis was performed using the standard protocol following bolus administration of intravenous contrast. CONTRAST:  14mL OMNIPAQUE IOHEXOL 350 MG/ML SOLN COMPARISON:  CT abdomen pelvis 05/03/2020 FINDINGS: Lower chest: Passive atelectasis of the right lower lobe. Otherwise no acute abnormality. 2 mm pulmonary micronodule within the right middle lobe. Hepatobiliary: No focal liver abnormality. Status post cholecystectomy. No biliary dilatation. Pancreas: No focal lesion. Normal pancreatic contour. No surrounding inflammatory changes. No main pancreatic ductal dilatation. Spleen: Normal in size without focal abnormality. A splenule is noted. Adrenals/Urinary Tract: No adrenal nodule bilaterally. Bilateral kidneys enhance symmetrically. No hydronephrosis. No hydroureter. Bilateral severe hydroureteronephrosis. Irregular urinary bladder wall thickening with associated diverticula. On delayed imaging, trace excretion of intravenous  contrast starting within bilateral kidneys. Stomach/Bowel: Stomach is within normal limits. No evidence of bowel wall thickening or dilatation. Few scattered colonic diverticula. Appendix appears normal. Vascular/Lymphatic: No abdominal aorta or iliac aneurysm. Severe atherosclerotic plaque of the aorta and its branches. No abdominal, pelvic, or inguinal lymphadenopathy. Reproductive: Status post hysterectomy. No adnexal masses. Other: No intraperitoneal free fluid. No intraperitoneal free gas. No organized fluid collection. Musculoskeletal: No abdominal wall hernia or abnormality. No suspicious lytic or blastic osseous lesions. No acute displaced fracture. Multilevel degenerative changes of the spine. Grade 1 anterolisthesis of L4 on L5. IMPRESSION: 1. Persistent circumferential irregular urinary bladder wall thickening with suggestion of  associated diverticula. Interval development of bilateral severe hydroureteronephrosis. These findings reflect chronic changes of obstructive uropathy or reflux. Underlying malignancy not excluded. Recommend urologic consultation. 2.  Aortic Atherosclerosis (ICD10-I70.0). Electronically Signed   By: Iven Finn M.D.   On: 09/28/2021 20:28   DG Chest Port 1 View  Result Date: 09/28/2021 CLINICAL DATA:  Dark red bloody stools. EXAM: PORTABLE CHEST 1 VIEW COMPARISON:  August 28, 2021 FINDINGS: The heart size and mediastinal contours are within normal limits. Both lungs are clear. An intact right shoulder replacement is noted. IMPRESSION: No active disease. Electronically Signed   By: Virgina Norfolk M.D.   On: 09/28/2021 19:36   Results for orders placed or performed during the hospital encounter of 10/15/21  Resp Panel by RT-PCR (Flu A&B, Covid) Nasopharyngeal Swab     Status: None   Collection Time: 10/15/21 12:47 PM   Specimen: Nasopharyngeal Swab; Nasopharyngeal(NP) swabs in vial transport medium  Result Value Ref Range Status   SARS Coronavirus 2 by RT PCR NEGATIVE NEGATIVE Final    Comment: (NOTE) SARS-CoV-2 target nucleic acids are NOT DETECTED.  The SARS-CoV-2 RNA is generally detectable in upper respiratory specimens during the acute phase of infection. The lowest concentration of SARS-CoV-2 viral copies this assay can detect is 138 copies/mL. A negative result does not preclude SARS-Cov-2 infection and should not be used as the sole basis for treatment or other patient management decisions. A negative result may occur with  improper specimen collection/handling, submission of specimen other than nasopharyngeal swab, presence of viral mutation(s) within the areas targeted by this assay, and inadequate number of viral copies(<138 copies/mL). A negative result must be combined with clinical observations, patient history, and epidemiological information. The expected result is  Negative.  Fact Sheet for Patients:  EntrepreneurPulse.com.au  Fact Sheet for Healthcare Providers:  IncredibleEmployment.be  This test is no t yet approved or cleared by the [place] FDA and  has been authorized for detection and/or diagnosis of SARS-CoV-2 by FDA under an Emergency Use Authorization (EUA). This EUA will remain  in effect (meaning this test can be used) for the duration of the COVID-19 declaration under Section 564(b)(1) of the Act, 21 U.S.C.section 360bbb-3(b)(1), unless the authorization is terminated  or revoked sooner.       Influenza A by PCR NEGATIVE NEGATIVE Final   Influenza B by PCR NEGATIVE NEGATIVE Final    Comment: (NOTE) The Xpert Xpress SARS-CoV-2/FLU/RSV plus assay is intended as an aid in the diagnosis of influenza from Nasopharyngeal swab specimens and should not be used as a sole basis for treatment. Nasal washings and aspirates are unacceptable for Xpert Xpress SARS-CoV-2/FLU/RSV testing.  Fact Sheet for Patients: EntrepreneurPulse.com.au  Fact Sheet for Healthcare Providers: IncredibleEmployment.be  This test is not yet approved or cleared by the [place] FDA and has been authorized for detection  and/or diagnosis of SARS-CoV-2 by FDA under an Emergency Use Authorization (EUA). This EUA will remain in effect (meaning this test can be used) for the duration of the COVID-19 declaration under Section 564(b)(1) of the Act, 21 U.S.C. section 360bbb-3(b)(1), unless the authorization is terminated or revoked.  Performed at [facility], [place] [address] [place], [place] [address]   Culture, blood (routine x 2)     Status: None (Preliminary result)   Collection Time: 10/15/21 10:26 PM   Specimen: BLOOD  Result Value Ref Range Status   Specimen Description   Final    BLOOD BLOOD LEFT HAND Performed at [facility]  [address] [place], [place] [address]    Special Requests   Final    BOTTLES DRAWN AEROBIC AND ANAEROBIC Blood Culture results may not be optimal due to an excessive volume of blood received in culture bottles Performed at [facility] [address] [place], [place] [address]    Culture   Final    NO GROWTH 3 DAYS Performed at [facility], [place] [address], [place], [place] [address]    Report Status PENDING  Incomplete  Culture, blood (routine x 2)     Status: None (Preliminary result)   Collection Time: 10/15/21 10:35 PM   Specimen: BLOOD  Result Value Ref Range Status   Specimen Description   Final    BLOOD BLOOD RIGHT FOREARM Performed at [facility] [address], [place], [place] [address]    Special Requests   Final    BOTTLES DRAWN AEROBIC AND ANAEROBIC Blood Culture adequate volume Performed at [facility] [address], [place], [place] [address]    Culture   Final    NO GROWTH 3 DAYS Performed at [facility], [place] [address] [place], [place] [address]    Report Status PENDING  Incomplete  SARS CORONAVIRUS 2 (TAT 6-24 HRS) Nasopharyngeal Nasopharyngeal Swab     Status: None   Collection Time: 10/18/21  2:07 PM   Specimen: Nasopharyngeal Swab  Result Value Ref Range Status   SARS Coronavirus 2 NEGATIVE NEGATIVE Final    Comment: (NOTE) SARS-CoV-2 target nucleic acids are NOT DETECTED.  The SARS-CoV-2 RNA is generally detectable in upper and lower respiratory specimens during the acute phase of infection. Negative results do not preclude SARS-CoV-2 infection, do not rule out co-infections with other pathogens, and should not be used as the sole basis for treatment or other patient management decisions. Negative results must be combined with clinical observations, patient history, and epidemiological information. The expected result is Negative.  Fact Sheet for  Patients: SugarRoll.be  Fact Sheet for Healthcare Providers: https://www.woods-mathews.com/  This test is not yet approved or cleared by the [place] FDA and  has been authorized for detection and/or diagnosis of SARS-CoV-2 by FDA under an Emergency Use Authorization (EUA). This EUA will remain  in effect (meaning this test can be used) for the duration of the COVID-19 declaration under Se ction 564(b)(1) of the Act, 21 U.S.C. section 360bbb-3(b)(1), unless the authorization is terminated or revoked sooner.  Performed at [facility], [place] [address], [place], [place] [address]     Signed:  Dwyane Dee MD.  [facility] 10/19/2021, 11:43 AM

## 2021-10-19 NOTE — TOC Progression Note (Addendum)
Transition of Care [facility]) - Progression Note    Patient Details  Name: Sonya Duffy MRN: [id number] Date of Birth: December 02, 1946  Transition of [facility]) CM/SW Contact  Leeroy Cha, RN Phone Number: 10/19/2021, 8:05 AM  Clinical Narrative:    Tct-Daughter Sonya Duffy accepted bed offered of [facility], Sonya Duffy, [facility], [facility] in [place] and .  Will call back this am with decision. TCF-Sonya Duffy dtg. has chosen Sonya Duffy in [place]. Tct-soy art Sonya Duffy have a bed family will need to be contacted and sign for p[tient-dtg is aware of this-md made aware. 0952-request for insurance auth submitted to [employer] . [employer] doc [phone] status pending. Plan Auth MN:[id number] Sonya Duffy [id number] Tct-Soy at Sonya Duffy on daughter to call back and then patient can come. Expected Discharge Plan: [place] Barriers to Discharge: Continued Medical Work up  Expected Discharge Plan and Services Expected Discharge Plan: [place]   Discharge Planning Services: CM Consult   Living arrangements for the past 2 months: Single Family Home                                       Social Determinants of Health (SDOH) Interventions    Readmission Risk Interventions No flowsheet data found.

## 2021-10-19 NOTE — Progress Notes (Signed)
PTAR arrived to pt room to transfer pt to [facility]. Pt was given scheduled medicines a few minutes prior to their arrival. Pt daughter, [name] updated and will meet pt at [facility]. Pt daughter is aware of pt room number.   Pt VSS prior to transfer. No s/s of distress, pt denies discomfort. Foley catheter cleaned and intact.

## 2021-10-19 NOTE — Progress Notes (Signed)
Occupational Therapy Treatment Patient Details Name: Sonya Duffy MRN: [id number] DOB: 1946-10-04 Today's Date: 10/19/2021   History of present illness Sonya Duffy is a 75 yo female who presented with right leg weakness with increased difficulty walking and daughter reported slurred speech. MRI positive for acute infarcts in Lt pons. PMH: CVA, DMII, HTN, HLD, NSTEMI   OT comments  Pt with progress towards goals this session. Pt performed sit to stand from elevated bed height with min A and RW. Pt requiring min A +2 and mod multimodal cues to shift weight to take steps and required chair follow. Pt also performed grooming sitting in recliner with set up and min verbal cues for sequencing and attending to tasks. Pt reporting feet felt "stiff". Educated on performing BLE ankle pumps in sitting to avoid increased stiffness, pt demonstrated understanding of education. Continuing to recommend SNF rehab post-acute stay.   Recommendations for follow up therapy are one component of a multi-disciplinary discharge planning process, led by the attending physician.  Recommendations may be updated based on patient status, additional functional criteria and insurance authorization.    Follow Up Recommendations  Skilled nursing-short term rehab (<3 hours/day)    Assistance Recommended at Discharge    Equipment Recommendations  Other (comment) (TBD)    Recommendations for Other Services      Precautions / Restrictions Precautions Precautions: Fall Precaution Comments: Rt lean, weight shift Restrictions Weight Bearing Restrictions: No       Mobility Bed Mobility Overal bed mobility: Needs Assistance       Supine to sit: Mod assist     General bed mobility comments: assit to raise trunk and scoot EOB    Transfers Overall transfer level: Needs assistance Equipment used: Rolling walker (2 wheels) Transfers: Sit to/from Stand;Bed to chair/wheelchair/BSC Sit to Stand: Min assist;+2  safety/equipment           General transfer comment: requires min A and mod multimodal cues to shift weight and take steps forwards, +2 for chair follow     Balance Overall balance assessment: Needs assistance Sitting-balance support: Feet supported;Single extremity supported Sitting balance-Leahy Scale: Fair   Postural control: Right lateral lean Standing balance support: Bilateral upper extremity supported;Reliant on assistive device for balance Standing balance-Leahy Scale: Poor Standing balance comment: requires BUE support and external support.                           ADL either performed or assessed with clinical judgement   ADL Overall ADL's : Needs assistance/impaired     Grooming: Set up;Sitting;Wash/dry face;Brushing hair;Oral care Grooming Details (indicate cue type and reason): performed oral care with min subtle question cues for sequencing and redirecting to task.                             Functional mobility during ADLs: Minimal assistance;Rolling walker (2 wheels);+2 for safety/equipment      Extremity/Trunk Assessment              Vision       Perception     Praxis      Cognition Arousal/Alertness: Awake/alert Behavior During Therapy: WFL for tasks assessed/performed                                   General Comments: [name], place, unable to state  month with verbal cues but said "the thanksgiving one". Short-term memory WFL, able to remember ordering breakfast last night and that she got up to the chair with PT yesterday.          Exercises     Shoulder Instructions       General Comments      Pertinent Vitals/ Pain       Pain Assessment: Faces Faces Pain Scale: Hurts a little bit Pain Location: reports stiff feet Pain Descriptors / Indicators: Sore Pain Intervention(s): Limited activity within patient's tolerance;Monitored during session  Home Living                                           Prior Functioning/Environment              Frequency  Min 2X/week        Progress Toward Goals  OT Goals(current goals can now be found in the care plan section)  Progress towards OT goals: Progressing toward goals  Acute Rehab OT Goals Patient Stated Goal: to get stronger OT Goal Formulation: With patient Time For Goal Achievement: 10/31/21 Potential to Achieve Goals: Good ADL Goals Pt Will Perform Grooming: with mod assist;sitting Pt Will Perform Lower Body Dressing: sit to/from stand;with mod assist Pt Will Transfer to Toilet: with mod assist;regular height toilet Pt Will Perform Toileting - Clothing Manipulation and hygiene: with mod assist;sit to/from stand  Plan Discharge plan remains appropriate    Co-evaluation                 AM-PAC OT "6 Clicks" Daily Activity     Outcome Measure   Help from another person eating meals?: A Little Help from another person taking care of personal grooming?: A Little Help from another person toileting, which includes using toliet, bedpan, or urinal?: A Lot Help from another person bathing (including washing, rinsing, drying)?: A Lot Help from another person to put on and taking off regular upper body clothing?: A Little Help from another person to put on and taking off regular lower body clothing?: A Lot 6 Click Score: 15    End of Session Equipment Utilized During Treatment: Gait belt;Rolling walker (2 wheels)  OT Visit Diagnosis: Muscle weakness (generalized) (M62.81);Unsteadiness on feet (R26.81);Other abnormalities of gait and mobility (R26.89)   Activity Tolerance Patient tolerated treatment well   Patient Left in bed;with bed alarm set;with call bell/phone within reach;with family/visitor present   Nurse Communication Mobility status        Time: 7948-0165 OT Time Calculation (min): 26 min  Charges: OT General Charges $OT Visit: 1 Visit OT Treatments $Self Care/Home  Management : 8-22 mins $Therapeutic Activity: 8-22 mins  Sonya Duffy, OTS Acute Rehab Office: [phone]   Sonya Duffy 10/19/2021, 9:27 AM

## 2021-10-21 LAB — CULTURE, BLOOD (ROUTINE X 2)
Culture: NO GROWTH
Culture: NO GROWTH
Special Requests: ADEQUATE

## 2021-10-23 ENCOUNTER — Encounter (HOSPITAL_COMMUNITY): Payer: Self-pay | Admitting: Family

## 2021-10-29 ENCOUNTER — Encounter (HOSPITAL_COMMUNITY): Payer: Self-pay | Admitting: Radiology

## 2021-11-23 ENCOUNTER — Ambulatory Visit (HOSPITAL_BASED_OUTPATIENT_CLINIC_OR_DEPARTMENT_OTHER): Payer: Medicare Other | Admitting: Family

## 2021-11-23 ENCOUNTER — Other Ambulatory Visit: Payer: Self-pay

## 2021-11-23 ENCOUNTER — Encounter (HOSPITAL_BASED_OUTPATIENT_CLINIC_OR_DEPARTMENT_OTHER): Payer: Self-pay | Admitting: Family

## 2021-11-23 VITALS — BP 124/66 | HR 83 | Ht 62.0 in | Wt 126.0 lb

## 2021-11-23 DIAGNOSIS — I252 Old myocardial infarction: Secondary | ICD-10-CM

## 2021-11-23 DIAGNOSIS — E785 Hyperlipidemia, unspecified: Secondary | ICD-10-CM

## 2021-11-23 DIAGNOSIS — I10 Essential (primary) hypertension: Secondary | ICD-10-CM | POA: Diagnosis not present

## 2021-11-23 DIAGNOSIS — E1165 Type 2 diabetes mellitus with hyperglycemia: Secondary | ICD-10-CM

## 2021-11-23 DIAGNOSIS — Z8673 Personal history of transient ischemic attack (TIA), and cerebral infarction without residual deficits: Secondary | ICD-10-CM

## 2021-11-23 DIAGNOSIS — Z794 Long term (current) use of insulin: Secondary | ICD-10-CM

## 2021-11-23 MED ORDER — TICAGRELOR 90 MG PO TABS: 90.0000 mg | ORAL_TABLET | Freq: Two times a day (BID) | ORAL | 5 refills | Status: AC

## 2021-11-23 MED ORDER — ASPIRIN EC 81 MG PO TBEC: 81.0000 mg | DELAYED_RELEASE_TABLET | Freq: Every day | ORAL | 3 refills | Status: AC

## 2021-11-23 NOTE — Patient Instructions (Addendum)
Medication Instructions:  Your physician has recommended you make the following change in your medication:    START Aspirin 81mg  daily  CONTINUE Brilinta 90mg  twice daily  *If you need a refill on your cardiac medications before your next appointment, please call your pharmacy*   Lab Work: None ordered today.   Testing/Procedures: None ordered today.  We will consider a stress test when you are more recovered from your stroke.    Follow-Up: At [facility], you and your health needs are our priority.  As part of our continuing mission to provide you with exceptional heart care, we have created designated Provider Care Teams.  These Care Teams include your primary Cardiologist (physician) and Advanced Practice Providers (APPs -  Physician Assistants and Nurse Practitioners) who all work together to provide you with the care you need, when you need it.  We recommend signing up for the patient portal called "MyChart".  Sign up information is provided on this After Visit Summary.  MyChart is used to connect with patients for Virtual Visits (Telemedicine).  Patients are able to view lab/test results, encounter notes, upcoming appointments, etc.  Non-urgent messages can be sent to your provider as well.   To learn more about what you can do with MyChart, go to [email or web address].    Your next appointment:   3 month(s)  The format for your next appointment:   In Person  Provider:   Dr. Debara Pickett or Loel Dubonnet, NP     Other [place] Stroke Support Group Contact: Ms. Page Spiro, DPT [phone] [email or web address] Meeting on the 2nd 4pm-5pm at [place] at [place] [phone] [facility])  Recommend checking out the [place] when you have completed physical therapy.

## 2021-11-23 NOTE — Progress Notes (Signed)
Office Visit    Patient Name: Sonya Duffy Date of Encounter: 11/23/2021  PCP:  Elwyn Reach, MD   [place]  Cardiologist:  Pixie Casino, MD  Advanced Practice Provider:  No care team member to display Electrophysiologist:  None      Chief Complaint    Sonya Duffy is a 75 y.o. female with a hx of DM2, CVA, NSTEMI, HTN, HLD presents today for hospital follow up   Past Medical History    Past Medical History:  Diagnosis Date   CVA (cerebral vascular accident) ([place]) 08/2021   Diabetes mellitus    DKA (diabetic ketoacidosis) ([place]) 08/2021   08/2021 A1c 13.3   Hyperlipidemia    Hypertension    NSTEMI (non-ST elevated myocardial infarction) ([place]) 08/2021   (a) admision 08/2021 with NSTEMI in setting of DKA and CVA   Past Surgical History:  Procedure Laterality Date   CHOLECYSTECTOMY      Allergies  Allergies  Allergen Reactions   Ativan [Lorazepam] Other (See Comments)    Causes agitation, confusion and paranoia. DO NOT GIVE. Takes many hours to wash out of her system.     Plavix [Clopidogrel]     Intolerance - she is P2y12 non responder - do not use    History of Present Illness    Sonya Duffy is a 75 y.o. female with a hx of DM2, CVA, NSTEMI, HTN, HLD last seen while hospitalized.  Admitted 08/2021 with CVA (MRi with left pontine infarct), NSTEMI with subsequent echo showing normal LVEF and apical akinesis, MSSA bacteremia, diabetic ketoacidosis with A1c 13.3. Cardiology consulted and recommended for outpatient ischemic evaluation. ED visit 09/28/20 with UTI. Foley placed and recommended for outpatient follow up with urology and abx. Seen in follow up 10/03/21 for follow up and outpatient ischemic evaluation with lexiscan myoview discussed.   Readmitted 10/15/21-10/19/21 due to acute ischemic CVA.  Noncontrast MRI revealed multiple infarcts in the left pons.  Her P2 Y 12 level was 252 consistent with Plavix nonresponder.   She was transitioned to [employer].  Presents today for follow up with her daughter. She completed 3 week course of rehab at [facility] and is pleased with her progress. She was even able to dress herself this morning without assistance. She did see urology yesterday for follow up regarding urinary retention and continues with leg foley back. Since discharge notes no chest pain. She reports only mild dyspnea with physical therapy exercises and is starting home health therapy next week.   EKGs/Labs/Other Studies Reviewed:   The following studies were reviewed today:  Limited echo 08/30/21: 1. Limited study with definity to R/O apical thrombus; apical akinesis  with overall low normal LV function; no thrombus noted.   2. Left ventricular ejection fraction, by estimation, is 50 to 55%. The  left ventricle has low normal function. The left ventricle demonstrates  regional wall motion abnormalities (see scoring diagram/findings for  description).   3. Right ventricular systolic function is normal. The right ventricular  size is normal.      Echo 08/29/21:  1. Left ventricular ejection fraction, by estimation, is 55 to 60%. The  left ventricle has normal function. The left ventricle demonstrates  regional wall motion abnormalities (see scoring diagram/findings for  description). There is moderate asymmetric  left ventricular hypertrophy of the basal-septal segment. Indeterminate  diastolic filling due to E-A fusion.   2. Right ventricular systolic function is normal. The right ventricular  size  is normal. Tricuspid regurgitation signal is inadequate for assessing  PA pressure.   3. The mitral valve is normal in structure. Trivial mitral valve  regurgitation. No evidence of mitral stenosis.   4. The aortic valve is grossly normal. Aortic valve regurgitation is not  visualized. No aortic stenosis is present.   5. The inferior vena cava is normal in size with greater than 50%  respiratory variability,  suggesting right atrial pressure of 3 mmHg.     EKG:  No EKG today.   Recent Labs: 08/28/2021: TSH 1.372 10/16/2021: ALT 16; Hemoglobin 10.1; Platelets 370 10/17/2021: BUN 18; Creatinine, Ser 0.93; Magnesium 1.9; Potassium 4.0; Sodium 134  Recent Lipid Panel    Component Value Date/Time   CHOL 118 10/16/2021 0500   TRIG 112 10/16/2021 0500   HDL 31 (L) 10/16/2021 0500   CHOLHDL 3.8 10/16/2021 0500   VLDL 22 10/16/2021 0500   LDLCALC 65 10/16/2021 0500    Home Medications   Current Meds  Medication Sig   aspirin EC 81 MG tablet Take 1 tablet (81 mg total) by mouth daily. Swallow whole.     Review of Systems      All other systems reviewed and are otherwise negative except as noted above.  Physical Exam    VS:  BP 124/66    Pulse 83    Ht 5\' 2"  (1.575 m)    Wt 126 lb (57.2 kg)    BMI 23.05 kg/m  , BMI Body mass index is 23.05 kg/m.  Wt Readings from Last 3 Encounters:  11/23/21 126 lb (57.2 kg)  10/17/21 127 lb 3.2 oz (57.7 kg)  10/03/21 128 lb 9.6 oz (58.3 kg)     GEN: well nourished, well developed, in no acute distress. HEENT: normal. Neck: Supple, no JVD, carotid bruits, or masses. Cardiac: RRR, no murmurs, rubs, or gallops. No clubbing, cyanosis, edema.  Radials/PT 2+ and equal bilaterally.  Respiratory:  Respirations regular and unlabored, clear to auscultation bilaterally. GI: Soft, nontender, nondistended. Foley catheter in place. MS: No deformity or atrophy. Skin: Warm and dry, no rash. Neuro:  Strength and sensation are intact. Psych: Normal affect.  Assessment & Plan    MSSA bacteremia - Recent admission. Followed by ID. Treated with abx. Per ID note 09/02/21 by Dr. Gale Journey plan to treat with 2 week abx and close monitoring rather than subjecting to TEE.  History of CVA - Plan per neurology for DAPT x 3 months then aspirin alone. Given Plavix non-responder she is on Brilinta. Continue statin. Optimal BP and A1c control encouraged. Follow with neurology.    NSTEMI - In setting of CVA, MSSA bacteremia 08/2021. Echo preserved LVEF and apical akinesis with no troponin. Troponin peak 08/28/21. EKg during admission with precordial T inversions. Plan for lexiscan myoview vs cardiac CTA once recovered from CVA. Given no anginal symptoms, will readdress at follow up. As she distances from admission 08/2021 with AKI may be able to utilize cardiac CTA for specificity over myoview.   HLD, LDL goal <70 - 10/16/21 LDL 65. Continue Atorvastatin 40mg  QD.   HTN - BP well controlled. Continue current antihypertensive regimen.    DM2 - Continue to follow with PCP. 08/28/21 A1c 13.3 in setting of noncompiance improved to 10/16/21 A1c 9.    CKD - AKI during previous admission. Careful titration of diuretic and antihypertensive.    Disposition: Follow up in 2-3 month(s) with Dr. Debara Pickett  or APP.  Signed, Loel Dubonnet, NP 11/23/2021, 9:43 PM  [facility]

## 2021-11-24 ENCOUNTER — Encounter (HOSPITAL_BASED_OUTPATIENT_CLINIC_OR_DEPARTMENT_OTHER): Payer: Self-pay | Admitting: Family

## 2021-11-28 ENCOUNTER — Other Ambulatory Visit: Payer: Self-pay

## 2021-11-28 ENCOUNTER — Ambulatory Visit: Payer: Medicare Other | Admitting: Podiatry

## 2021-12-04 ENCOUNTER — Emergency Department (HOSPITAL_COMMUNITY): Payer: Medicare Other

## 2021-12-04 ENCOUNTER — Other Ambulatory Visit: Payer: Self-pay

## 2021-12-04 ENCOUNTER — Encounter (HOSPITAL_COMMUNITY): Payer: Self-pay

## 2021-12-04 ENCOUNTER — Inpatient Hospital Stay (HOSPITAL_COMMUNITY)
Admission: EM | Admit: 2021-12-04 | Discharge: 2021-12-06 | DRG: 698 | Disposition: A | Discharge status: Home Health | Payer: Medicare Other | Attending: Student | Admitting: Student

## 2021-12-04 DIAGNOSIS — I10 Essential (primary) hypertension: Secondary | ICD-10-CM | POA: Diagnosis present

## 2021-12-04 DIAGNOSIS — G9341 Metabolic encephalopathy: Secondary | ICD-10-CM | POA: Diagnosis present

## 2021-12-04 DIAGNOSIS — T83518A Infection and inflammatory reaction due to other urinary catheter, initial encounter: Principal | ICD-10-CM | POA: Diagnosis present

## 2021-12-04 DIAGNOSIS — T368X5A Adverse effect of other systemic antibiotics, initial encounter: Secondary | ICD-10-CM | POA: Diagnosis present

## 2021-12-04 DIAGNOSIS — Z794 Long term (current) use of insulin: Secondary | ICD-10-CM

## 2021-12-04 DIAGNOSIS — Z7902 Long term (current) use of antithrombotics/antiplatelets: Secondary | ICD-10-CM

## 2021-12-04 DIAGNOSIS — Y846 Urinary catheterization as the cause of abnormal reaction of the patient, or of later complication, without mention of misadventure at the time of the procedure: Secondary | ICD-10-CM | POA: Diagnosis present

## 2021-12-04 DIAGNOSIS — I639 Cerebral infarction, unspecified: Secondary | ICD-10-CM | POA: Diagnosis present

## 2021-12-04 DIAGNOSIS — Z8673 Personal history of transient ischemic attack (TIA), and cerebral infarction without residual deficits: Secondary | ICD-10-CM

## 2021-12-04 DIAGNOSIS — R339 Retention of urine, unspecified: Secondary | ICD-10-CM | POA: Diagnosis present

## 2021-12-04 DIAGNOSIS — R443 Hallucinations, unspecified: Secondary | ICD-10-CM | POA: Diagnosis not present

## 2021-12-04 DIAGNOSIS — E785 Hyperlipidemia, unspecified: Secondary | ICD-10-CM | POA: Diagnosis present

## 2021-12-04 DIAGNOSIS — I2510 Atherosclerotic heart disease of native coronary artery without angina pectoris: Secondary | ICD-10-CM | POA: Diagnosis present

## 2021-12-04 DIAGNOSIS — I252 Old myocardial infarction: Secondary | ICD-10-CM

## 2021-12-04 DIAGNOSIS — Z20822 Contact with and (suspected) exposure to covid-19: Secondary | ICD-10-CM | POA: Diagnosis present

## 2021-12-04 DIAGNOSIS — Z7982 Long term (current) use of aspirin: Secondary | ICD-10-CM

## 2021-12-04 DIAGNOSIS — E1165 Type 2 diabetes mellitus with hyperglycemia: Secondary | ICD-10-CM | POA: Diagnosis present

## 2021-12-04 DIAGNOSIS — E538 Deficiency of other specified B group vitamins: Secondary | ICD-10-CM | POA: Diagnosis present

## 2021-12-04 DIAGNOSIS — R441 Visual hallucinations: Secondary | ICD-10-CM | POA: Diagnosis present

## 2021-12-04 DIAGNOSIS — E871 Hypo-osmolality and hyponatremia: Secondary | ICD-10-CM | POA: Diagnosis present

## 2021-12-04 DIAGNOSIS — Z8249 Family history of ischemic heart disease and other diseases of the circulatory system: Secondary | ICD-10-CM

## 2021-12-04 DIAGNOSIS — N179 Acute kidney failure, unspecified: Secondary | ICD-10-CM | POA: Diagnosis present

## 2021-12-04 DIAGNOSIS — D329 Benign neoplasm of meninges, unspecified: Secondary | ICD-10-CM | POA: Diagnosis present

## 2021-12-04 DIAGNOSIS — Z79899 Other long term (current) drug therapy: Secondary | ICD-10-CM

## 2021-12-04 DIAGNOSIS — N390 Urinary tract infection, site not specified: Secondary | ICD-10-CM | POA: Diagnosis present

## 2021-12-04 DIAGNOSIS — Z888 Allergy status to other drugs, medicaments and biological substances status: Secondary | ICD-10-CM

## 2021-12-04 DIAGNOSIS — Z87440 Personal history of urinary (tract) infections: Secondary | ICD-10-CM

## 2021-12-04 DIAGNOSIS — Z7984 Long term (current) use of oral hypoglycemic drugs: Secondary | ICD-10-CM

## 2021-12-04 LAB — CBC
HCT: 33.3 % — ABNORMAL LOW (ref 36.0–46.0)
Hemoglobin: 10.6 g/dL — ABNORMAL LOW (ref 12.0–15.0)
MCH: 28.3 pg (ref 26.0–34.0)
MCHC: 31.8 g/dL (ref 30.0–36.0)
MCV: 88.8 fL (ref 80.0–100.0)
Platelets: 341 10*3/uL (ref 150–400)
RBC: 3.75 MIL/uL — ABNORMAL LOW (ref 3.87–5.11)
RDW: 15.9 % — ABNORMAL HIGH (ref 11.5–15.5)
WBC: 10.5 10*3/uL (ref 4.0–10.5)
nRBC: 0 % (ref 0.0–0.2)

## 2021-12-04 LAB — BASIC METABOLIC PANEL
Anion gap: 6 (ref 5–15)
BUN: 18 mg/dL (ref 8–23)
CO2: 20 mmol/L — ABNORMAL LOW (ref 22–32)
Calcium: 8.4 mg/dL — ABNORMAL LOW (ref 8.9–10.3)
Chloride: 106 mmol/L (ref 98–111)
Creatinine, Ser: 1.11 mg/dL — ABNORMAL HIGH (ref 0.44–1.00)
GFR, Estimated: 51 mL/min — ABNORMAL LOW (ref >60)
Glucose, Bld: 101 mg/dL — ABNORMAL HIGH (ref 70–99)
Potassium: 4.5 mmol/L (ref 3.5–5.1)
Sodium: 132 mmol/L — ABNORMAL LOW (ref 135–145)

## 2021-12-04 LAB — CBG MONITORING, ED: Glucose-Capillary: 90 mg/dL (ref 70–99)

## 2021-12-04 IMAGING — CT CT HEAD W/O CM
3 series · 15 of 46 positions shown, 18 images · non-contrast
Comparison: [DATE]

CLINICAL DATA: Delirium, recent urinary tract infection,
hallucinations

EXAM:
CT HEAD WITHOUT CONTRAST
TECHNIQUE: Contiguous axial images were obtained from the base of the skull
through the vertex without intravenous contrast.

[Series 2: head wo · axial · 0.44mm/px · z∈[+1570,+1690]mm · 9 of 29 slices shown, 12 images]
[im 3/29  brain]
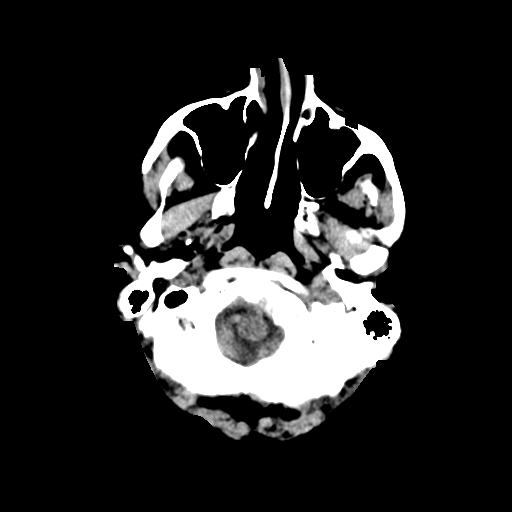
[im 3/29  bone]
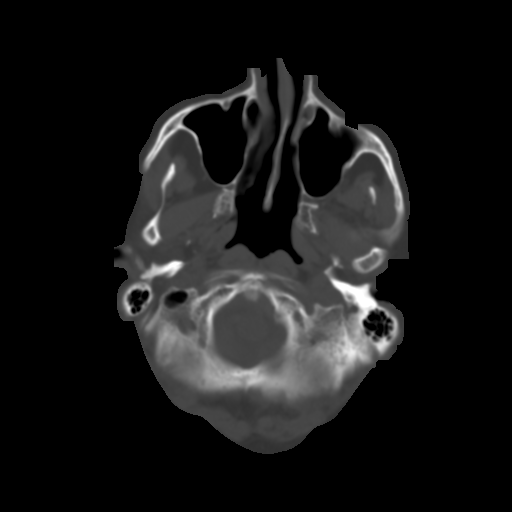
[im 6/29  brain]
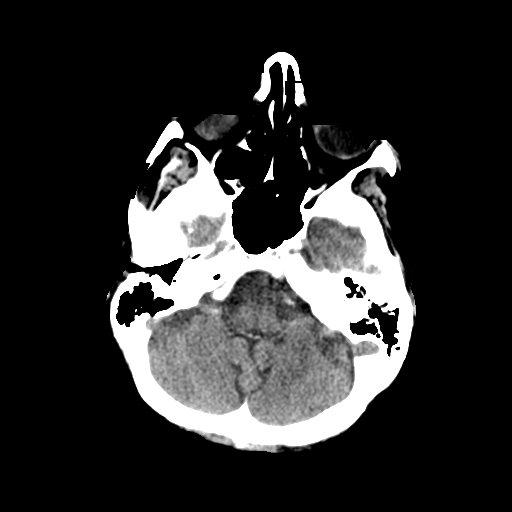
[im 9/29  brain]
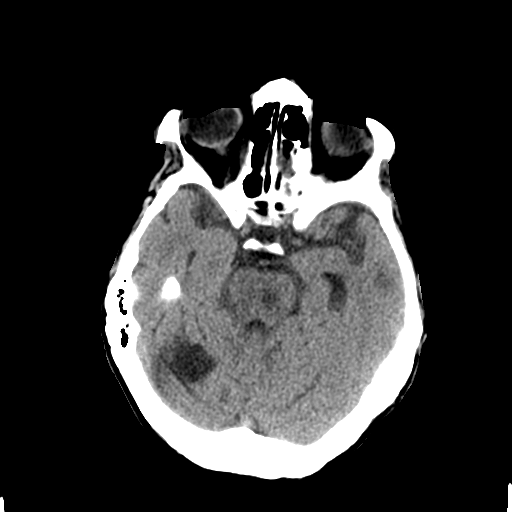
[im 12/29  brain]
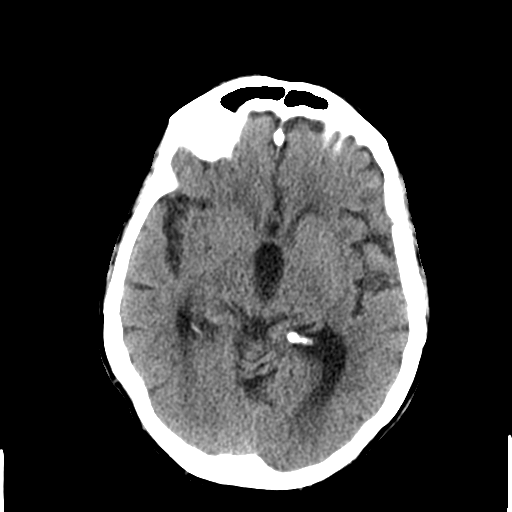
[im 15/29  brain]
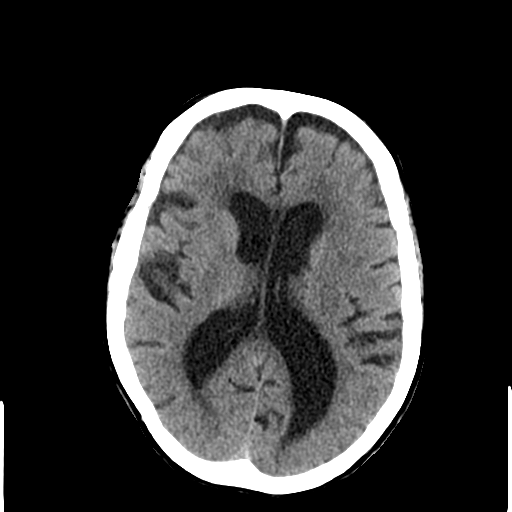
[im 15/29  bone]
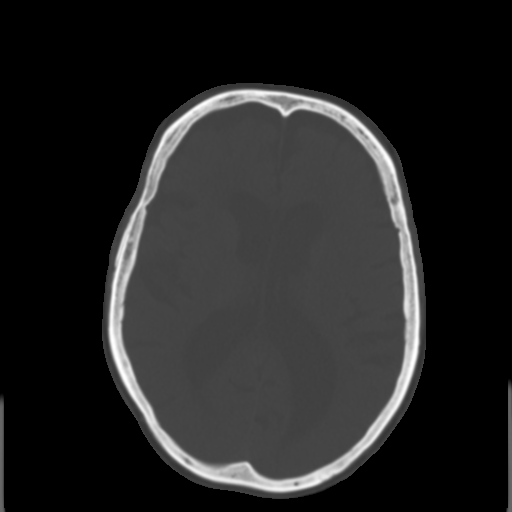
[im 18/29  brain]
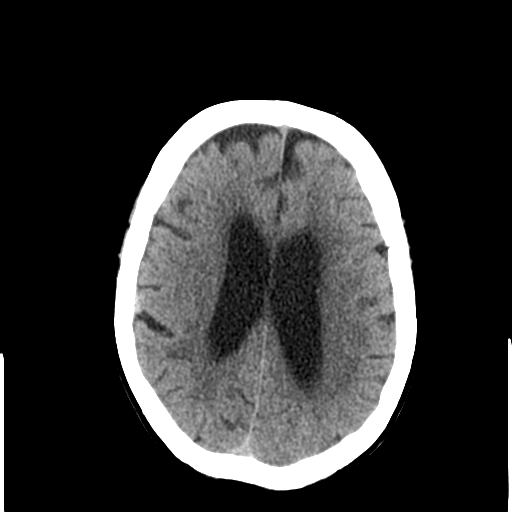
[im 21/29  brain]
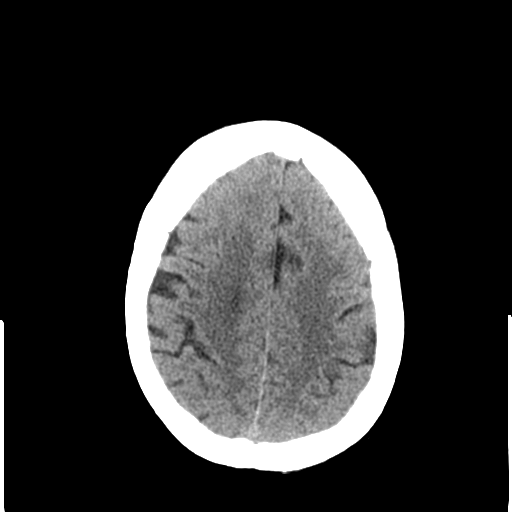
[im 24/29  brain]
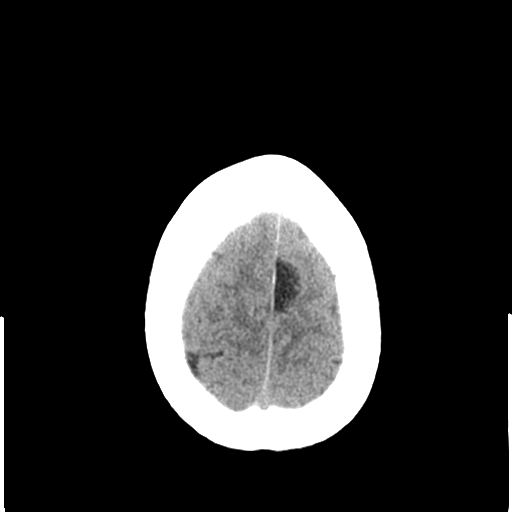
[im 27/29  brain]
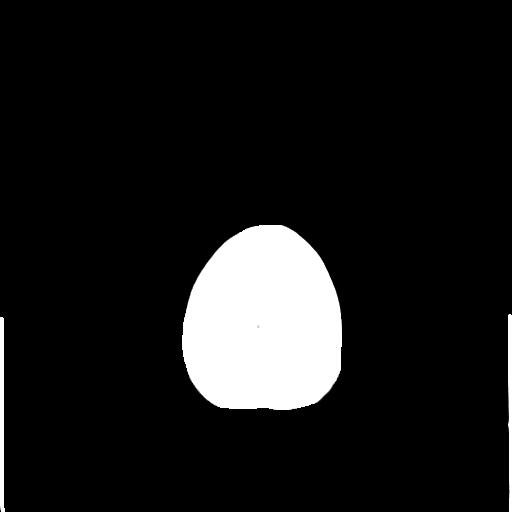
[im 27/29  bone]
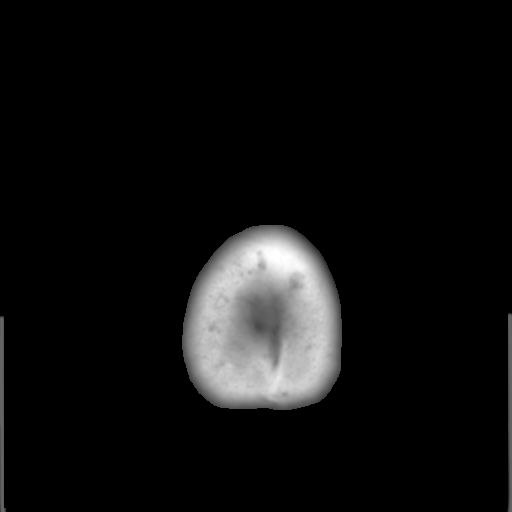

[Series 5: coronal soft tissue · coronal · 0.27mm/px · 3 of 65 slices shown]
[im 23/65  brain]
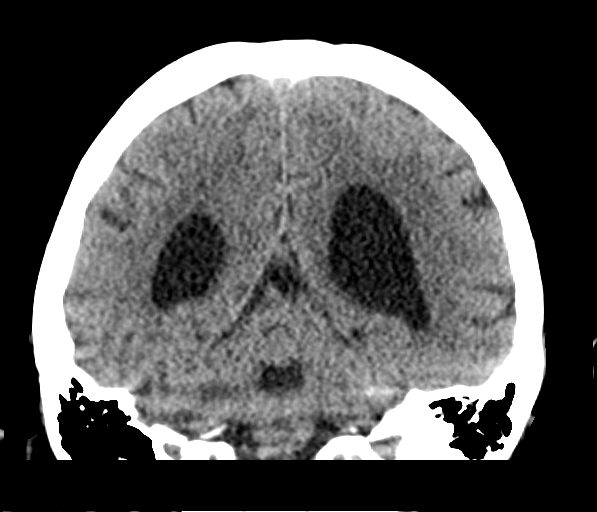
[im 29/65  brain]
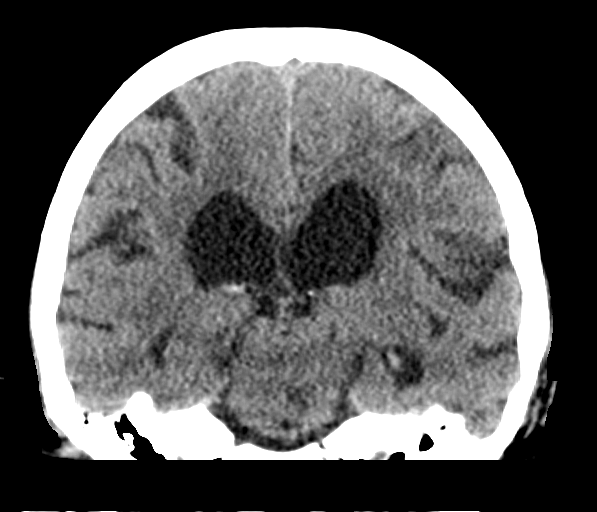
[im 36/65  brain]
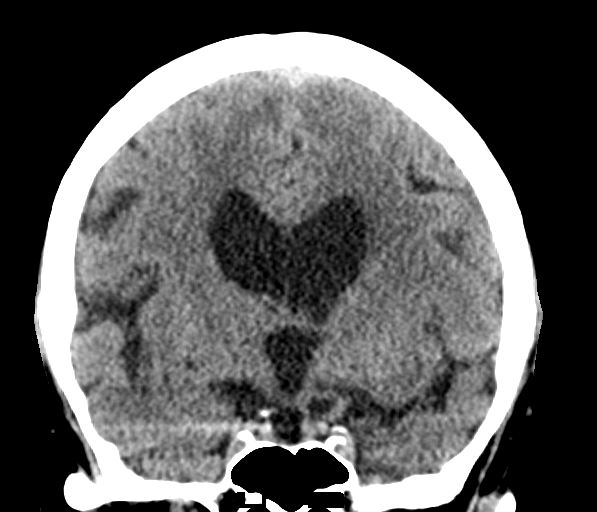

[Series 6: sagittal soft tissue · sagittal · 0.27mm/px · 3 of 55 slices shown]
[im 19/55  brain]
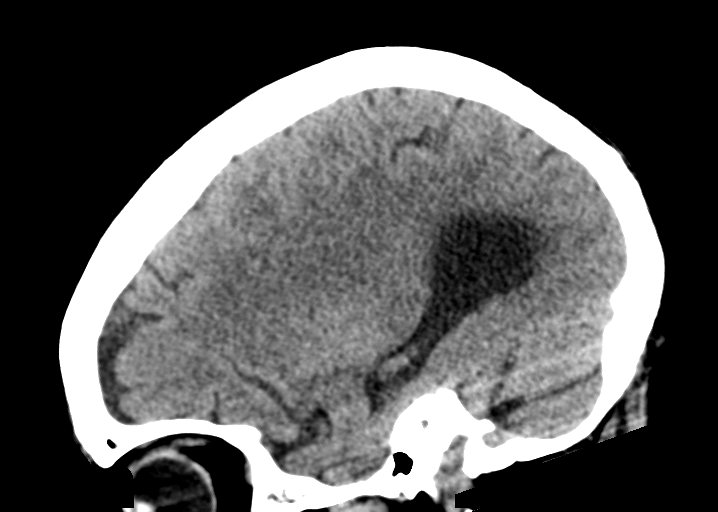
[im 28/55  brain]
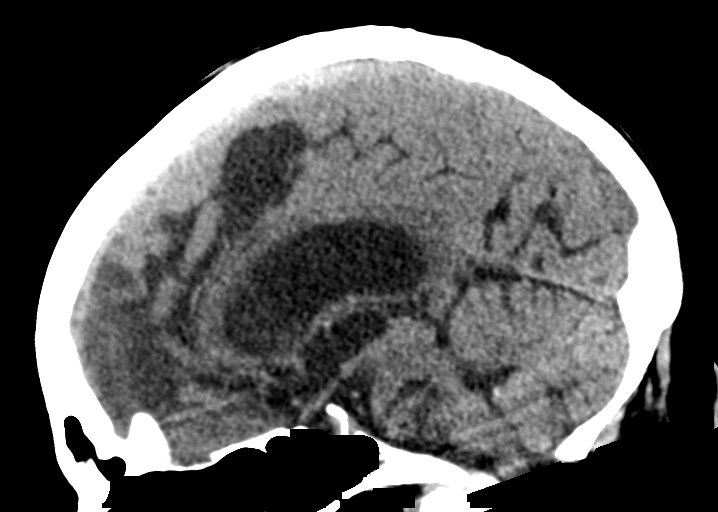
[im 37/55  brain]
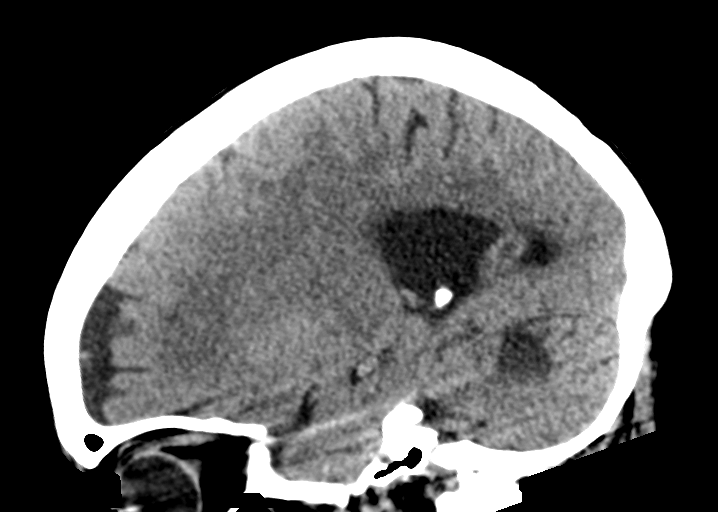

[15 of 46 positions shown; findings below may reference images not displayed]

FINDINGS: Brain: Chronic ischemic changes are seen throughout the
periventricular white matter, pons, and right cerebellar hemisphere.
No signs of acute infarct or hemorrhage. Lateral ventricles and
remaining midline structures are unremarkable. There are no acute
extra-axial fluid collections. Stable right frontotemporal
meningioma. No mass effect.

Vascular: No hyperdense vessel or unexpected calcification.

Skull: Normal. Negative for fracture or focal lesion.

Sinuses/Orbits: No acute finding.

Other: None.
IMPRESSION: 1. Chronic ischemic changes as above. No acute intracranial process.
2. Stable right frontotemporal meningioma.

## 2021-12-04 NOTE — ED Triage Notes (Signed)
Pt is on her last abt pill for a recent UTI and is having hallucinations x 2 days. Pt's daughter states that she has a hx of strokes and is having increased right sided weakness.

## 2021-12-04 NOTE — ED Provider Notes (Addendum)
Emergency Medicine Provider Triage Evaluation Note  Sonya Duffy , a 75 y.o. female  was evaluated in triage.  Pt complains of hallucinations. Has been more confused (thinking people were outside the window) over the past 48 hours. Seems she was hallucinating a hole in the floor today.   Foley for 2 weeks. Currently being treated for UTI.    Review of Systems  Positive: Weakness, confusion Negative: Fever   Physical Exam  BP 120/79 (BP Location: Left Arm)    Pulse 94    Temp 98.2 F (36.8 C) (Oral)    Resp 14    Ht 5\' 2"  (1.575 m)    Wt 58.1 kg    SpO2 98%    BMI 23.41 kg/m  Gen:   Awake, no distress   Resp:  Normal effort  MSK:   Moves extremities without difficulty  Other:    Alert and oriented to self, place, time and event.   Speech is fluent, clear without dysarthria or dysphasia.   Strength RUE mild 4/5 weakness w flex/extention at elbow. LUE 5/5 strength.  RLE/LLE w symmetric strength.  Sensation intact in upper/lower extremities   CN I not tested  CN II grossly intact visual fields bilaterally. Did not visualize posterior eye.  CN III, IV, VI PERRLA and EOMs intact bilaterally  CN V Intact sensation to sharp and light touch to the face  CN VII facial movements symmetric  CN VIII not tested  CN IX, X no uvula deviation, symmetric rise of soft palate  CN XI 5/5 SCM and trapezius strength bilaterally  CN XII Midline tongue protrusion, symmetric L/R movements    Medical Decision Making  Medically screening exam initiated at 8:09 PM.  Appropriate orders placed.  [name] was informed that the remainder of the evaluation will be completed by another provider, this initial triage assessment does not replace that evaluation, and the importance of remaining in the ED until their evaluation is complete.  Last known normal was bedtime 25th.  NOTABLY - pt has extreme reaction to ativan   Tedd Sias, [place] 12/04/21 2017    Tedd Sias, [place] 12/04/21  2018    Godfrey Pick, MD 12/05/21 [phone]

## 2021-12-05 DIAGNOSIS — E871 Hypo-osmolality and hyponatremia: Secondary | ICD-10-CM | POA: Diagnosis present

## 2021-12-05 DIAGNOSIS — N179 Acute kidney failure, unspecified: Secondary | ICD-10-CM | POA: Diagnosis not present

## 2021-12-05 DIAGNOSIS — Z7984 Long term (current) use of oral hypoglycemic drugs: Secondary | ICD-10-CM | POA: Diagnosis not present

## 2021-12-05 DIAGNOSIS — Z8673 Personal history of transient ischemic attack (TIA), and cerebral infarction without residual deficits: Secondary | ICD-10-CM | POA: Diagnosis not present

## 2021-12-05 DIAGNOSIS — E785 Hyperlipidemia, unspecified: Secondary | ICD-10-CM | POA: Diagnosis present

## 2021-12-05 DIAGNOSIS — R441 Visual hallucinations: Secondary | ICD-10-CM | POA: Diagnosis present

## 2021-12-05 DIAGNOSIS — T83518A Infection and inflammatory reaction due to other urinary catheter, initial encounter: Secondary | ICD-10-CM | POA: Diagnosis present

## 2021-12-05 DIAGNOSIS — Z888 Allergy status to other drugs, medicaments and biological substances status: Secondary | ICD-10-CM | POA: Diagnosis not present

## 2021-12-05 DIAGNOSIS — Z7982 Long term (current) use of aspirin: Secondary | ICD-10-CM | POA: Diagnosis not present

## 2021-12-05 DIAGNOSIS — Z79899 Other long term (current) drug therapy: Secondary | ICD-10-CM | POA: Diagnosis not present

## 2021-12-05 DIAGNOSIS — I10 Essential (primary) hypertension: Secondary | ICD-10-CM | POA: Diagnosis present

## 2021-12-05 DIAGNOSIS — D329 Benign neoplasm of meninges, unspecified: Secondary | ICD-10-CM | POA: Diagnosis present

## 2021-12-05 DIAGNOSIS — Z8249 Family history of ischemic heart disease and other diseases of the circulatory system: Secondary | ICD-10-CM | POA: Diagnosis not present

## 2021-12-05 DIAGNOSIS — G9341 Metabolic encephalopathy: Secondary | ICD-10-CM | POA: Diagnosis present

## 2021-12-05 DIAGNOSIS — R443 Hallucinations, unspecified: Secondary | ICD-10-CM | POA: Diagnosis present

## 2021-12-05 DIAGNOSIS — N390 Urinary tract infection, site not specified: Secondary | ICD-10-CM

## 2021-12-05 DIAGNOSIS — Z87440 Personal history of urinary (tract) infections: Secondary | ICD-10-CM | POA: Diagnosis not present

## 2021-12-05 DIAGNOSIS — Z20822 Contact with and (suspected) exposure to covid-19: Secondary | ICD-10-CM | POA: Diagnosis present

## 2021-12-05 DIAGNOSIS — E1165 Type 2 diabetes mellitus with hyperglycemia: Secondary | ICD-10-CM | POA: Diagnosis present

## 2021-12-05 DIAGNOSIS — Y846 Urinary catheterization as the cause of abnormal reaction of the patient, or of later complication, without mention of misadventure at the time of the procedure: Secondary | ICD-10-CM | POA: Diagnosis present

## 2021-12-05 DIAGNOSIS — Z794 Long term (current) use of insulin: Secondary | ICD-10-CM | POA: Diagnosis not present

## 2021-12-05 DIAGNOSIS — E538 Deficiency of other specified B group vitamins: Secondary | ICD-10-CM | POA: Diagnosis present

## 2021-12-05 DIAGNOSIS — I2510 Atherosclerotic heart disease of native coronary artery without angina pectoris: Secondary | ICD-10-CM | POA: Diagnosis present

## 2021-12-05 DIAGNOSIS — I252 Old myocardial infarction: Secondary | ICD-10-CM | POA: Diagnosis not present

## 2021-12-05 DIAGNOSIS — T368X5A Adverse effect of other systemic antibiotics, initial encounter: Secondary | ICD-10-CM | POA: Diagnosis present

## 2021-12-05 DIAGNOSIS — Z7902 Long term (current) use of antithrombotics/antiplatelets: Secondary | ICD-10-CM | POA: Diagnosis not present

## 2021-12-05 LAB — URINALYSIS, ROUTINE W REFLEX MICROSCOPIC
Bilirubin Urine: NEGATIVE
Glucose, UA: NEGATIVE mg/dL
Ketones, ur: NEGATIVE mg/dL
Nitrite: NEGATIVE
Protein, ur: 30 mg/dL — AB
Specific Gravity, Urine: 1.016 (ref 1.005–1.030)
WBC, UA: >50 WBC/hpf — ABNORMAL HIGH (ref 0–5)
pH: 5 (ref 5.0–8.0)

## 2021-12-05 LAB — CBC
HCT: 31.9 % — ABNORMAL LOW (ref 36.0–46.0)
Hemoglobin: 10.3 g/dL — ABNORMAL LOW (ref 12.0–15.0)
MCH: 28.2 pg (ref 26.0–34.0)
MCHC: 32.3 g/dL (ref 30.0–36.0)
MCV: 87.4 fL (ref 80.0–100.0)
Platelets: 290 10*3/uL (ref 150–400)
RBC: 3.65 MIL/uL — ABNORMAL LOW (ref 3.87–5.11)
RDW: 15.7 % — ABNORMAL HIGH (ref 11.5–15.5)
WBC: 6.9 10*3/uL (ref 4.0–10.5)
nRBC: 0 % (ref 0.0–0.2)

## 2021-12-05 LAB — CBG MONITORING, ED: Glucose-Capillary: 95 mg/dL (ref 70–99)

## 2021-12-05 LAB — HEMOGLOBIN A1C
Hgb A1c MFr Bld: 6.5 % — ABNORMAL HIGH (ref 4.8–5.6)
Mean Plasma Glucose: 139.85 mg/dL

## 2021-12-05 LAB — GLUCOSE, CAPILLARY
Glucose-Capillary: 312 mg/dL — ABNORMAL HIGH (ref 70–99)
Glucose-Capillary: 227 mg/dL — ABNORMAL HIGH (ref 70–99)

## 2021-12-05 LAB — CREATININE, SERUM
Creatinine, Ser: 0.9 mg/dL (ref 0.44–1.00)
GFR, Estimated: >60 mL/min (ref >60)

## 2021-12-05 LAB — VITAMIN B12: Vitamin B-12: 241 pg/mL (ref 180–914)

## 2021-12-05 LAB — RESP PANEL BY RT-PCR (FLU A&B, COVID) ARPGX2
Influenza A by PCR: NEGATIVE
Influenza B by PCR: NEGATIVE
SARS Coronavirus 2 by RT PCR: NEGATIVE

## 2021-12-05 MED ORDER — MELATONIN 5 MG PO TABS
5.0000 mg | ORAL_TABLET | Freq: Every evening | ORAL | Status: DC | PRN
  Administered 2021-12-05: 23:00:00 5 mg via ORAL
  Filled 2021-12-05: qty 1

## 2021-12-05 MED ORDER — DIPHENOXYLATE-ATROPINE 2.5-0.025 MG PO TABS
1.0000 | ORAL_TABLET | Freq: Every morning | ORAL | Status: DC
  Administered 2021-12-05 – 2021-12-06 (×2): 1 via ORAL
  Filled 2021-12-05 (×2): qty 1

## 2021-12-05 MED ORDER — ACETAMINOPHEN 650 MG RE SUPP: 650.0000 mg | Freq: Four times a day (QID) | RECTAL | Status: DC | PRN

## 2021-12-05 MED ORDER — SODIUM CHLORIDE 0.9 % IV SOLN
1.0000 g | Freq: Once | INTRAVENOUS | Status: AC
  Administered 2021-12-05: 04:00:00 1 g via INTRAVENOUS
  Filled 2021-12-05: qty 10

## 2021-12-05 MED ORDER — POLYETHYLENE GLYCOL 3350 17 G PO PACK: 17.0000 g | PACK | Freq: Every day | ORAL | Status: DC | PRN

## 2021-12-05 MED ORDER — METOPROLOL TARTRATE 12.5 MG HALF TABLET
12.5000 mg | ORAL_TABLET | Freq: Two times a day (BID) | ORAL | Status: DC
  Administered 2021-12-05 – 2021-12-06 (×2): 12.5 mg via ORAL
  Filled 2021-12-05 (×3): qty 1

## 2021-12-05 MED ORDER — TICAGRELOR 90 MG PO TABS
90.0000 mg | ORAL_TABLET | Freq: Two times a day (BID) | ORAL | Status: DC
  Administered 2021-12-05 – 2021-12-06 (×3): 90 mg via ORAL
  Filled 2021-12-05 (×4): qty 1

## 2021-12-05 MED ORDER — ASPIRIN EC 81 MG PO TBEC
81.0000 mg | DELAYED_RELEASE_TABLET | Freq: Every day | ORAL | Status: DC
  Administered 2021-12-05 – 2021-12-06 (×2): 81 mg via ORAL
  Filled 2021-12-05 (×2): qty 1

## 2021-12-05 MED ORDER — CHLORHEXIDINE GLUCONATE CLOTH 2 % EX PADS
6.0000 | MEDICATED_PAD | Freq: Every day | CUTANEOUS | Status: DC
  Administered 2021-12-05 – 2021-12-06 (×2): 6 via TOPICAL

## 2021-12-05 MED ORDER — INSULIN GLARGINE-YFGN 100 UNIT/ML ~~LOC~~ SOLN
20.0000 [IU] | Freq: Every day | SUBCUTANEOUS | Status: DC
  Administered 2021-12-05: 23:00:00 20 [IU] via SUBCUTANEOUS
  Filled 2021-12-05 (×2): qty 0.2

## 2021-12-05 MED ORDER — FLUOXETINE HCL 20 MG PO CAPS
20.0000 mg | ORAL_CAPSULE | Freq: Every morning | ORAL | Status: DC
  Administered 2021-12-05 – 2021-12-06 (×2): 20 mg via ORAL
  Filled 2021-12-05 (×2): qty 1

## 2021-12-05 MED ORDER — ATORVASTATIN CALCIUM 40 MG PO TABS
40.0000 mg | ORAL_TABLET | Freq: Every day | ORAL | Status: DC
  Administered 2021-12-05 – 2021-12-06 (×2): 40 mg via ORAL
  Filled 2021-12-05 (×2): qty 1

## 2021-12-05 MED ORDER — SODIUM CHLORIDE 0.9 % IV SOLN
1.0000 g | INTRAVENOUS | Status: DC
  Administered 2021-12-05: 23:00:00 1 g via INTRAVENOUS
  Filled 2021-12-05 (×2): qty 10

## 2021-12-05 MED ORDER — SODIUM CHLORIDE 0.9 % IV SOLN: INTRAVENOUS | Status: AC

## 2021-12-05 MED ORDER — ENOXAPARIN SODIUM 30 MG/0.3ML IJ SOSY
30.0000 mg | PREFILLED_SYRINGE | INTRAMUSCULAR | Status: DC
  Administered 2021-12-05: 23:00:00 30 mg via SUBCUTANEOUS
  Filled 2021-12-05: qty 0.3

## 2021-12-05 MED ORDER — INSULIN ASPART 100 UNIT/ML IJ SOLN
0.0000 [IU] | Freq: Every day | INTRAMUSCULAR | Status: DC
  Administered 2021-12-05: 23:00:00 2 [IU] via SUBCUTANEOUS

## 2021-12-05 MED ORDER — LACTATED RINGERS IV SOLN: INTRAVENOUS | Status: DC

## 2021-12-05 MED ORDER — INSULIN GLARGINE 100 UNIT/ML SOLOSTAR PEN: 20.0000 [IU] | PEN_INJECTOR | Freq: Every day | SUBCUTANEOUS | Status: DC

## 2021-12-05 MED ORDER — PANTOPRAZOLE SODIUM 40 MG PO TBEC
40.0000 mg | DELAYED_RELEASE_TABLET | Freq: Every day | ORAL | Status: DC
  Administered 2021-12-05 – 2021-12-06 (×2): 40 mg via ORAL
  Filled 2021-12-05 (×2): qty 1

## 2021-12-05 MED ORDER — ACETAMINOPHEN 325 MG PO TABS: 650.0000 mg | ORAL_TABLET | Freq: Four times a day (QID) | ORAL | Status: DC | PRN

## 2021-12-05 MED ORDER — INSULIN ASPART 100 UNIT/ML IJ SOLN
0.0000 [IU] | Freq: Three times a day (TID) | INTRAMUSCULAR | Status: DC
  Administered 2021-12-05: 18:00:00 7 [IU] via SUBCUTANEOUS
  Administered 2021-12-06: 12:00:00 2 [IU] via SUBCUTANEOUS
  Administered 2021-12-06: 08:00:00 1 [IU] via SUBCUTANEOUS

## 2021-12-05 NOTE — Assessment & Plan Note (Signed)
-   Patient had NSTEMI in the setting of CVA, MSSA bacteremia in 08/2021. -Per cardiology, continue DAPT for 3 months, then aspirin alone.  Patient was Plavix nonresponder hence on Brilinta. -Continue aspirin, Brilinta, beta-blocker, statin.  Holding losartan today for mild acute kidney injury

## 2021-12-05 NOTE — Assessment & Plan Note (Addendum)
-   Patient presented with confusion, visual hallucinations, likely secondary to UTI, mild AKI.  UA positive -Currently, much more alert and oriented to place and person, knows it is December.  At this time denies any hallucinations. -Continue gentle hydration, IV antibiotics.

## 2021-12-05 NOTE — ED Provider Notes (Signed)
[facility] Emergency Department Provider Note MRN:  [id number]  Arrival date & time: 12/05/21     Chief Complaint   Hallucinations and UTI   History of Present Illness   Sonya Duffy is a 75 y.o. year-old female with a history of diabetes, stroke, hypertension presenting to the ED with chief complaint of hallucinations.  48 hours of intermittent hallucinations.  Seeing holes in the floor, seeing people that are not really there.  Recent history of DKA, multiple hospitalizations, stroke.  Patient feels very well other than the hallucinations.  No burning with urination, no fever, no chest pain, no abdominal pain.  Review of Systems  A complete 10 system review of systems was obtained and all systems are negative except as noted in the HPI and PMH.   Patient's Health History    Past Medical History:  Diagnosis Date   CVA (cerebral vascular accident) ([place]) 08/2021   Diabetes mellitus    DKA (diabetic ketoacidosis) ([place]) 08/2021   08/2021 A1c 13.3   Hyperlipidemia    Hypertension    NSTEMI (non-ST elevated myocardial infarction) ([place]) 08/2021   (a) admision 08/2021 with NSTEMI in setting of DKA and CVA    Past Surgical History:  Procedure Laterality Date   CHOLECYSTECTOMY      Family History  Problem Relation Age of Onset   Heart attack Mother     Social History   Socioeconomic History   Marital status: Divorced    Spouse name: Not on file   Number of children: Not on file   Years of education: Not on file   Highest education level: Not on file  Occupational History   Not on file  Tobacco Use   Smoking status: Never   Smokeless tobacco: Never  Vaping Use   Vaping Use: Never used  Substance and Sexual Activity   Alcohol use: No   Drug use: No   Sexual activity: Not on file  Other Topics Concern   Not on file  Social History Narrative   Not on file   Social Determinants of Health   Financial Resource Strain: Not on file  Food  Insecurity: Not on file  Transportation Needs: Not on file  Physical Activity: Not on file  Stress: Not on file  Social Connections: Not on file  Intimate Partner Violence: Not on file     Physical Exam   Vitals:   12/04/21 2004 12/05/21 0036  BP: 120/79 (!) 119/57  Pulse: 94 66  Resp: 14 16  Temp: 98.2 F (36.8 C)   SpO2: 98% 100%    CONSTITUTIONAL: Well-appearing, NAD NEURO:  Alert and oriented x 3, no focal deficits EYES:  eyes equal and reactive ENT/NECK:  no LAD, no JVD CARDIO: Regular rate, well-perfused, normal S1 and S2 PULM:  CTAB no wheezing or rhonchi GI/GU:  normal bowel sounds, non-distended, non-tender MSK/SPINE:  No gross deformities, no edema SKIN:  no rash, atraumatic PSYCH:  Appropriate speech and behavior  *Additional and/or pertinent findings included in MDM below  Diagnostic and Interventional Summary    EKG Interpretation  Date/Time:  Tuesday December 04 2021 21:34:23 EST Ventricular Rate:  75 PR Interval:  170 QRS Duration: 86 QT Interval:  380 QTC Calculation: 425 R Axis:   67 Text Interpretation: Sinus rhythm Nonspecific T abnormalities, lateral leads Confirmed by Gerlene Fee [phone]) on 12/05/2021 12:50:48 AM       Labs Reviewed  BASIC METABOLIC PANEL - Abnormal; Notable for the following components:  Result Value   Sodium 132 (*)    CO2 20 (*)    Glucose, Bld 101 (*)    Creatinine, Ser 1.11 (*)    Calcium 8.4 (*)    GFR, Estimated 51 (*)    All other components within normal limits  CBC - Abnormal; Notable for the following components:   RBC 3.75 (*)    Hemoglobin 10.6 (*)    HCT 33.3 (*)    RDW 15.9 (*)    All other components within normal limits  URINALYSIS, ROUTINE W REFLEX MICROSCOPIC - Abnormal; Notable for the following components:   APPearance CLOUDY (*)    Hgb urine dipstick SMALL (*)    Protein, ur 30 (*)    Leukocytes,Ua LARGE (*)    WBC, UA >50 (*)    Bacteria, UA RARE (*)    All other components within  normal limits  CBG MONITORING, ED    CT HEAD WO CONTRAST (5MM)  Final Result      Medications - No data to display   Procedures  /  Critical Care Procedures  ED Course and Medical Decision Making  I have reviewed the triage vital signs, the nursing notes, and pertinent available records from the EMR.  Listed above are laboratory and imaging tests that I personally ordered, reviewed, and interpreted and then considered in my medical decision making (see below for details).  Hallucinations, suspect due to UTI.  Patient has a nonfocal neurological exam, doubt stroke.  CT head is stable.  Urinalysis showing some evidence of infection, will send for culture.  Will admit to medicine.       Barth Kirks. [place] Small, [place] [email or web address]  Final Clinical Impressions(s) / ED Diagnoses     ICD-10-CM   1. Hallucinations  R44.3       ED Discharge Orders     None        Discharge Instructions Discussed with and Provided to Patient:   Discharge Instructions   None       Maudie Flakes, MD 12/05/21 [phone]

## 2021-12-05 NOTE — Assessment & Plan Note (Signed)
-   History of CVA in 08/2021.Sonya Duffy  Per neurology continue DAPT for 3 months and then aspirin alone.  Patient is Plavix nonresponder, hence continue aspirin, Brilinta, statin. -Currently, no acute focal neurological deficits - encephalopathy and hallucinations now improving, possibly due to UTI.

## 2021-12-05 NOTE — Plan of Care (Signed)
°  Problem: Safety: Goal: Ability to remain free from injury will improve Outcome: Progressing   Problem: Coping: Goal: Level of anxiety will decrease Outcome: Progressing   Problem: Nutrition: Goal: Adequate nutrition will be maintained Outcome: Progressing   Problem: Activity: Goal: Risk for activity intolerance will decrease Outcome: Progressing

## 2021-12-05 NOTE — Assessment & Plan Note (Signed)
-  A1c 9.0 on 10/16/2021 -Placed on sliding scale insulin/sensitive, continue Lantus 20 units at bedtime (home dose).

## 2021-12-05 NOTE — H&P (Signed)
History and Physical    Patient: Sonya Duffy GBT:[id number] DOB: [birth year] DOA: 12/04/2021 DOS: the patient was seen and examined on 12/05/2021 PCP: Elwyn Reach, MD  Patient coming from: Home  Chief Complaint: Confusion  HPI: Sonya Duffy is a 75 y.o. female with medical history significant of NSTEMI, CVA, MSSA bacteremia 08/2021, diabetes mellitus type 2, IDDM, HTN, HLD, indwelling Foley catheter due to urinary retention presented with hallucinations for last 2 days.  Patient had been recently diagnosed with UTI and was placed on ciprofloxacin outpatient.  At the time of my examination, patient is much more alert and oriented and reported that she was "seeing people who were not there" or felt like she was going to fall into a hole in the floor.  Otherwise no acute focal weakness. Patient reported increased urination but no dysuria, hematuria or fevers.  Patient had history of stroke in 08/2021, states she ambulates with a walker, currently lives at home and has advanced home therapy for PT.  ED course Temp 97.6, respirate 16, pulse 57, BP 130/46, O2 sats 100% on room air Sodium 132, potassium 4.5, BUN 18, creatinine 1.1, baseline 0.9 on 10/17/2021 WBCs 10.5, hemoglobin 10.6, at baseline, platelets 341 UA positive for UTI, urine culture sent EKG showed rate 75, normal sinus rhythm, no acute ST-T wave changes suggestive of ischemia  CT head showed chronic ischemic changes, no acute intracranial process.  Stable right frontotemporal meningioma.  Review of Systems: Review of Systems  Constitutional: Negative.   HENT: Negative.    Eyes: Negative.   Respiratory: Negative.    Cardiovascular: Negative.   Gastrointestinal: Negative.   Genitourinary:  Positive for frequency and urgency.  Musculoskeletal:  Positive for falls.  Skin: Negative.   Neurological:  Positive for weakness.  Endo/Heme/Allergies: Negative.   Psychiatric/Behavioral:  Positive for hallucinations.     Past  Medical History:  Diagnosis Date   CVA (cerebral vascular accident) ([place]) 08/2021   Diabetes mellitus    DKA (diabetic ketoacidosis) ([place]) 08/2021   08/2021 A1c 13.3   Hyperlipidemia    Hypertension    NSTEMI (non-ST elevated myocardial infarction) ([place]) 08/2021   (a) admision 08/2021 with NSTEMI in setting of DKA and CVA   Past Surgical History:  Procedure Laterality Date   CHOLECYSTECTOMY     Social History:  reports that she has never smoked. She has never used smokeless tobacco. She reports that she does not drink alcohol and does not use drugs.  Allergies  Allergen Reactions   Ativan [Lorazepam] Other (See Comments)    Causes agitation, confusion and paranoia. DO NOT GIVE. Takes many hours to wash out of her system.     Plavix [Clopidogrel]     Intolerance - she is P2y12 non responder - do not use    Family History  Problem Relation Age of Onset   Heart attack Mother     Prior to Admission medications   Medication Sig Start Date End Date Taking? Authorizing Provider  acetaminophen (TYLENOL) 500 MG tablet Take 1,000 mg by mouth every 6 (six) hours as needed for headache (pain).   Yes [provider]  aspirin EC 81 MG tablet Take 1 tablet (81 mg total) by mouth daily. Swallow whole. 11/23/21  Yes Loel Dubonnet, NP  atorvastatin (LIPITOR) 40 MG tablet Take 1 tablet (40 mg total) by mouth daily. 09/06/21 09/06/22 Yes Pokhrel, Laxman, MD  diphenoxylate-atropine (LOMOTIL) 2.5-0.025 MG tablet Take 1 tablet by mouth every morning.   Yes  [provider]  FLUoxetine (PROZAC) 20 MG capsule Take 20 mg by mouth every morning. 09/20/21  Yes [provider]  insulin aspart (NOVOLOG) 100 UNIT/ML injection Inject 0-15 Units into the skin 4 (four) times daily -  before meals and at bedtime. CBG 70 - 120: 0 units  CBG 121 - 150: 2 units  CBG 151 - 200: 3 units  CBG 201 - 250: 5 units  CBG 251 - 300: 8 units  CBG 301 - 350: 11 units  CBG 351 - 400: 15  units  CBG > 400 call MD 10/19/21  Yes Dwyane Dee, MD  LANTUS SOLOSTAR 100 UNIT/ML Solostar Pen Inject 20 Units into the skin at bedtime. 11/09/21  Yes [provider]  losartan (COZAAR) 25 MG tablet Take 1 tablet (25 mg total) by mouth every morning. 10/19/21  Yes Dwyane Dee, MD  melatonin 5 MG TABS Take 5 mg by mouth daily.   Yes [provider]  metFORMIN (GLUCOPHAGE) 1000 MG tablet Take 1,000 mg by mouth 2 (two) times daily. 08/26/19  Yes [provider]  metoprolol tartrate (LOPRESSOR) 25 MG tablet Take 0.5 tablets (12.5 mg total) by mouth 2 (two) times daily. 10/19/21  Yes Dwyane Dee, MD  pantoprazole (PROTONIX) 40 MG tablet Take 1 tablet (40 mg total) by mouth daily. 09/05/21 09/05/22 Yes Pokhrel, Laxman, MD  ticagrelor (BRILINTA) 90 MG TABS tablet Take 1 tablet (90 mg total) by mouth 2 (two) times daily. 11/23/21  Yes Loel Dubonnet, NP  ciprofloxacin (CIPRO) 250 MG tablet Take 250 mg by mouth 2 (two) times daily. 11/26/21   [provider]    Physical Exam: Vitals:   12/05/21 0841 12/05/21 0900 12/05/21 1000 12/05/21 1028  BP: (!) 130/46 (!) 115/94 (!) 117/59 (!) 128/52  Pulse: (!) 57 (!) 59 64 70  Resp: 16 15 15 18   Temp: 97.6 F (36.4 C)   97.8 F (36.6 C)  TempSrc: Oral   Oral  SpO2: 100% 99% 98% 97%  Weight:      Height:       Physical Exam General: Alert and oriented x place, person, knows it is December but not the date, NAD Cardiovascular: S1 S2 clear, RRR. No pedal edema b/l Respiratory: CTAB, no wheezing, rales or rhonchi Gastrointestinal: Soft, nontender, nondistended, NBS Ext: no pedal edema bilaterally MSK: No contractures, ROM intact Neuro: no new focal neurological deficits, strength 5/5 in Left upper and lower extremities, subtle 4/5 right-sided weakness from prior stroke, no dysarthria. Skin: No rashes Psych: Normal affect and demeanor, pleasant, alert and oriented x place and person GU: Indwelling Foley  catheter  Data Reviewed: Sodium 132, potassium 4.5, BUN 18, creatinine 1.1, baseline 0.9 on 10/17/2021 WBCs 10.5, hemoglobin 10.6, at baseline, platelets 341 CT head showed no acute stroke, chronic ischemic changes UA positive for UTI  Assessment/Plan * Acute metabolic encephalopathy- (present on admission) - Patient presented with confusion, visual hallucinations, likely secondary to UTI, mild AKI.  UA positive -Currently, much more alert and oriented to place and person, knows it is December.  At this time denies any hallucinations. -Continue gentle hydration, IV antibiotics.   Acute lower UTI- (present on admission) - Patient also has indwelling Foley catheter due to urinary retention, follows outpatient urology.  Has history of frequent UTIs. -UA positive for UTI, follow urine culture and sensitivities -Received 1 dose of IV Rocephin in ED, will continue and adjust antibiotic according to urine culture.  Urinary retention- (present on admission) -  Patient was diagnosed with urinary retention in 09/2021, has indwelling Foley catheter, follows outpatient urology.  AKI (acute kidney injury) ([place])- (present on admission) - Baseline creatinine 0.9, presented with creatinine of 1.1 with hyponatremia -Placed on gentle hydration x 24hours, hold losartan today, may resume in a.m. once creatinine function improved  Hyponatremia- (present on admission) - Appears to have chronic hyponatremia, baseline sodium 134, presented with sodium of 132 with mild acute kidney injury. -Continue normal saline IV hydration x 24 hours, encourage p.o. diet  Type 2 diabetes mellitus with hyperglycemia, with long-term current use of insulin (HCC) -A1c 9.0 on 10/16/2021 -Placed on sliding scale insulin/sensitive, continue Lantus 20 units at bedtime (home dose).  Coronary artery disease involving native coronary artery of native heart without angina pectoris- (present on admission) - Patient had NSTEMI in the  setting of CVA, MSSA bacteremia in 08/2021. -Per cardiology, continue DAPT for 3 months, then aspirin alone.  Patient was Plavix nonresponder hence on Brilinta. -Continue aspirin, Brilinta, beta-blocker, statin.  Holding losartan today for mild acute kidney injury  CVA (cerebral vascular accident) [facility])- (present on admission) - History of CVA in 08/2021.Marland Kitchen  Per neurology continue DAPT for 3 months and then aspirin alone.  Patient is Plavix nonresponder, hence continue aspirin, Brilinta, statin. -Currently, no acute focal neurological deficits - encephalopathy and hallucinations now improving, possibly due to UTI.  Essential hypertension- (present on admission) - BP stable, continue beta-blocker, holding losartan due to mild acute kidney injury, may resume in a.m.  Vitamin B12 deficiency- (present on admission) - B12 deficiency 147 noted in 08/2021, will recheck B12   Advance Care Planning: Discussed in detail with the patient's daughter, Ms. Katy Fitch, requested DNR/DNI status per patient's wish.  Consults: None  Family Communication: Discussed with patient's daughter, Ms. Billey Co on phone.  Severity of Illness: The appropriate patient status for this patient is INPATIENT. Inpatient status is judged to be reasonable and necessary in order to provide the required intensity of service to ensure the patient's safety. The patient's presenting symptoms, physical exam findings, and initial radiographic and laboratory data in the context of their chronic comorbidities is felt to place them at high risk for further clinical deterioration. Furthermore, it is not anticipated that the patient will be medically stable for discharge from the hospital within 2 midnights of admission.   * I certify that at the point of admission it is my clinical judgment that the patient will require inpatient hospital care spanning beyond 2 midnights from the point of admission due to high intensity of  service, high risk for further deterioration and high frequency of surveillance required.*  Author: Estill Cotta, MD 12/05/2021 10:35 AM  For on call review [email or web address].

## 2021-12-05 NOTE — Assessment & Plan Note (Signed)
-   Baseline creatinine 0.9, presented with creatinine of 1.1 with hyponatremia -Placed on gentle hydration x 24hours, hold losartan today, may resume in a.m. once creatinine function improved

## 2021-12-05 NOTE — Evaluation (Signed)
Physical Therapy Evaluation Patient Details Name: Sonya Duffy MRN: [id number] DOB: [birth year] Today's Date: 12/05/2021  History of Present Illness  Sonya Duffy is a 75 y.o. female with medical history significant of NSTEMI, CVA, MSSA bacteremia 08/2021, diabetes mellitus type 2, IDDM, HTN, HLD, indwelling Foley catheter due to urinary retention presented with hallucinations for last 2 days  Clinical Impression  The patient is very sweet, oriented x 3. Patient has family support at home and should progress to return home. Patient ambulated x 110' with Rw and min assistance. Pt admitted with above diagnosis.  Pt currently with functional limitations due to the deficits listed below (see PT Problem List). Pt will benefit from skilled PT to increase their independence and safety with mobility to allow discharge to the venue listed below.    Patient reports having [place] onboard.        Recommendations for follow up therapy are one component of a multi-disciplinary discharge planning process, led by the attending physician.  Recommendations may be updated based on patient status, additional functional criteria and insurance authorization.  Follow Up Recommendations Home health PT    Assistance Recommended at Discharge Frequent or constant Supervision/Assistance  Functional Status Assessment Patient has had a recent decline in their functional status and demonstrates the ability to make significant improvements in function in a reasonable and predictable amount of time.  Equipment Recommendations  None recommended by PT    Recommendations for Other Services       Precautions / Restrictions Precautions Precautions: Fall      Mobility  Bed Mobility Overal bed mobility: Needs Assistance Bed Mobility: Supine to Sit     Supine to sit: Mod assist     General bed mobility comments: assist to fully sit upright    Transfers Overall transfer level: Needs assistance Equipment used:  Rolling walker (2 wheels) Transfers: Sit to/from Stand Sit to Stand: Min assist           General transfer comment: steady assist to stand up    Ambulation/Gait Ambulation/Gait assistance: Min assist Gait Distance (Feet): 110 Feet Assistive device: Rolling walker (2 wheels) Gait Pattern/deviations: Step-through pattern;Step-to pattern Gait velocity: decr     General Gait Details: assist to guide RW in straight line  Stairs            Wheelchair Mobility    Modified Rankin (Stroke Patients Only)       Balance Overall balance assessment: Needs assistance Sitting-balance support: Feet supported;Bilateral upper extremity supported Sitting balance-Leahy Scale: Fair     Standing balance support: During functional activity;Bilateral upper extremity supported;Reliant on assistive device for balance Standing balance-Leahy Scale: Poor                               Pertinent Vitals/Pain Pain Assessment: No/denies pain    Home Living Family/patient expects to be discharged to:: Private residence Living Arrangements: Children Available Help at Discharge: Family;Available 24 hours/day Type of Home: House Home Access: Stairs to enter   [employer] of Steps: 4   Home Layout: One level Home Equipment: Conservation officer, nature (2 wheels)      Prior Function Prior Level of Function : Needs assist  Cognitive Assist : ADLs (cognitive)   ADLs (Cognitive): Set up cues Physical Assist : Mobility (physical);ADLs (physical) Mobility (physical): Bed mobility;Transfers;Gait;Stairs ADLs (physical): Bathing;Dressing;Toileting;IADLs Mobility Comments: pt reports using RW for household ambulation, daughters and son assists as needed with transfers,  active with HHPT ADLs Comments: pt reports daughter assists as needed with bathing, dressing, toileting, son available     Hand Dominance   Dominant Hand: Right    Extremity/Trunk Assessment   Upper Extremity  Assessment Upper Extremity Assessment: Overall WFL for tasks assessed    Lower Extremity Assessment Lower Extremity Assessment: Generalized weakness    Cervical / Trunk Assessment Cervical / Trunk Assessment: Normal  Communication   Communication: No difficulties  Cognition Arousal/Alertness: Awake/alert Behavior During Therapy: WFL for tasks assessed/performed Overall Cognitive Status: History of cognitive impairments - at baseline                                 General Comments: oriented to hospital and mo, day of wk        General Comments      Exercises     Assessment/Plan    PT Assessment Patient needs continued PT services  PT Problem List Decreased strength;Decreased mobility;Decreased balance;Decreased cognition       PT Treatment Interventions DME instruction;Therapeutic activities;Cognitive remediation;Gait training;Therapeutic exercise;Patient/family education;Functional mobility training    PT Goals (Current goals can be found in the Care Plan section)  Acute Rehab PT Goals Patient Stated Goal: likes to walk PT Goal Formulation: With patient Time For Goal Achievement: 12/19/21 Potential to Achieve Goals: Good    Frequency Min 3X/week   Barriers to discharge        Co-evaluation               AM-PAC PT "6 Clicks" Mobility  Outcome Measure Help needed turning from your back to your side while in a flat bed without using bedrails?: A Lot Help needed moving from lying on your back to sitting on the side of a flat bed without using bedrails?: A Lot Help needed moving to and from a bed to a chair (including a wheelchair)?: A Little Help needed standing up from a chair using your arms (e.g., wheelchair or bedside chair)?: A Little Help needed to walk in hospital room?: A Little Help needed climbing 3-5 steps with a railing? : A Lot 6 Click Score: 15    End of Session Equipment Utilized During Treatment: Gait belt Activity  Tolerance: Patient tolerated treatment well Patient left: in chair;with call bell/phone within reach;with chair alarm set Nurse Communication: Mobility status PT Visit Diagnosis: Unsteadiness on feet (R26.81)    Time: 1443-1500 PT Time Calculation (min) (ACUTE ONLY): 17 min   Charges:   PT Evaluation $PT Eval Low Complexity: 1 Low          [place] Pager [phone] Office [phone]   Claretha Cooper 12/05/2021, 3:15 PM

## 2021-12-05 NOTE — Assessment & Plan Note (Signed)
-   Appears to have chronic hyponatremia, baseline sodium 134, presented with sodium of 132 with mild acute kidney injury. -Continue normal saline IV hydration x 24 hours, encourage p.o. diet

## 2021-12-05 NOTE — Assessment & Plan Note (Signed)
-   B12 deficiency 147 noted in 08/2021, will recheck B12

## 2021-12-05 NOTE — Assessment & Plan Note (Signed)
Presented with visual hallucinations okay abdominal

## 2021-12-05 NOTE — ED Notes (Signed)
Pt in bed, meal tray at bedside, pt denies pain, pt oriented to person and place, doesn't know day of the week, re oriented pt, pt denies hallucinations at this time.  Admitting md at bedside

## 2021-12-05 NOTE — ED Notes (Addendum)
ED TO INPATIENT HANDOFF REPORT  ED Nurse Name and Phone #: Salvatore Decent Name/Age/Gender Sonya Duffy 75 y.o. female Room/Bed: WA14/WA14  Code Status   Code Status: DNR  Home/SNF/Other Home Patient oriented to: self, place, and time Is this baseline? Yes   Triage Complete: Triage complete  Chief Complaint Acute metabolic encephalopathy [O53.66]  Triage Note Pt is on her last abt pill for a recent UTI and is having hallucinations x 2 days. Pt's daughter states that she has a hx of strokes and is having increased right sided weakness.    Allergies Allergies  Allergen Reactions   Ativan [Lorazepam] Other (See Comments)    Causes agitation, confusion and paranoia. DO NOT GIVE. Takes many hours to wash out of her system.     Plavix [Clopidogrel]     Intolerance - she is P2y12 non responder - do not use    Level of Care/Admitting Diagnosis ED Disposition     ED Disposition  Admit   Condition  --   [place]: [place] [[id number]]  Level of Care: Med-Surg [16]  May admit patient to [facility] or [facility] if equivalent level of care is available:: Yes  Covid Evaluation: Asymptomatic Screening Protocol (No Symptoms)  Diagnosis: Acute metabolic encephalopathy [4403474]  Admitting Physician: RAI, Vernelle Emerald [[id number]]  Attending Physician: RAI, RIPUDEEP K [[id number]]  Estimated length of stay: past midnight tomorrow  Certification:: I certify this patient will need inpatient services for at least 2 midnights          B Medical/Surgery History Past Medical History:  Diagnosis Date   CVA (cerebral vascular accident) ([place]) 08/2021   Diabetes mellitus    DKA (diabetic ketoacidosis) ([place]) 08/2021   08/2021 A1c 13.3   Hyperlipidemia    Hypertension    NSTEMI (non-ST elevated myocardial infarction) ([place]) 08/2021   (a) admision 08/2021 with NSTEMI in setting of DKA and CVA   Past Surgical History:  Procedure Laterality Date   CHOLECYSTECTOMY        A IV Location/Drains/Wounds Patient Lines/Drains/Airways Status     Active Line/Drains/Airways     Name Placement date Placement time Site Days   Peripheral IV 12/05/21 22 G Left;Proximal;Lateral Antecubital 12/05/21  0335  Antecubital  less than 1   Urethral Catheter Vida Rigger NT+3 Double-lumen 14 Fr. 09/28/21  2237  Double-lumen  68   Pressure Injury 10/17/21 Sacrum Mid Stage 3 -  Full thickness tissue loss. Subcutaneous fat may be visible but bone, tendon or muscle are NOT exposed. 1 cm X 0.5 cm 10/17/21  1215  -- 49            Intake/Output Last 24 hours  Intake/Output Summary (Last 24 hours) at 12/05/2021 1245 Last data filed at 12/05/2021 0517 Gross per 24 hour  Intake 100 ml  Output --  Net 100 ml    Labs/Imaging Results for orders placed or performed during the hospital encounter of 12/04/21 (from the past 48 hour(s))  Basic metabolic panel     Status: Abnormal   Collection Time: 12/04/21  9:07 PM  Result Value Ref Range   Sodium 132 (L) 135 - 145 mmol/L   Potassium 4.5 3.5 - 5.1 mmol/L   Chloride 106 98 - 111 mmol/L   CO2 20 (L) 22 - 32 mmol/L   Glucose, Bld 101 (H) 70 - 99 mg/dL    Comment: Glucose reference range applies only to samples taken after fasting for at least  8 hours.   BUN 18 8 - 23 mg/dL   Creatinine, Ser 1.11 (H) 0.44 - 1.00 mg/dL   Calcium 8.4 (L) 8.9 - 10.3 mg/dL   GFR, Estimated 51 (L) >60 mL/min    Comment: (NOTE) Calculated using the CKD-EPI Creatinine Equation (2021)    Anion gap 6 5 - 15    Comment: Performed at [facility], [place] [address], [place], [place] [address]  CBC     Status: Abnormal   Collection Time: 12/04/21  9:07 PM  Result Value Ref Range   WBC 10.5 4.0 - 10.5 K/uL   RBC 3.75 (L) 3.87 - 5.11 MIL/uL   Hemoglobin 10.6 (L) 12.0 - 15.0 g/dL   HCT 33.3 (L) 36.0 - 46.0 %   MCV 88.8 80.0 - 100.0 fL   MCH 28.3 26.0 - 34.0 pg   MCHC 31.8 30.0 - 36.0 g/dL   RDW 15.9 (H) 11.5 - 15.5 %    Platelets 341 150 - 400 K/uL   nRBC 0.0 0.0 - 0.2 %    Comment: Performed at [facility], [place] [address], [place], [place] [address]  CBG monitoring, ED     Status: None   Collection Time: 12/04/21 11:25 PM  Result Value Ref Range   Glucose-Capillary 90 70 - 99 mg/dL    Comment: Glucose reference range applies only to samples taken after fasting for at least 8 hours.  Urinalysis, Routine w reflex microscopic Urine, Catheterized     Status: Abnormal   Collection Time: 12/05/21 12:36 AM  Result Value Ref Range   Color, Urine YELLOW YELLOW   APPearance CLOUDY (A) CLEAR   Specific Gravity, Urine 1.016 1.005 - 1.030   pH 5.0 5.0 - 8.0   Glucose, UA NEGATIVE NEGATIVE mg/dL   Hgb urine dipstick SMALL (A) NEGATIVE   Bilirubin Urine NEGATIVE NEGATIVE   Ketones, ur NEGATIVE NEGATIVE mg/dL   Protein, ur 30 (A) NEGATIVE mg/dL   Nitrite NEGATIVE NEGATIVE   Leukocytes,Ua LARGE (A) NEGATIVE   RBC / HPF 11-20 0 - 5 RBC/hpf   WBC, UA >50 (H) 0 - 5 WBC/hpf   Bacteria, UA RARE (A) NONE SEEN   Squamous Epithelial / LPF 0-5 0 - 5   Mucus PRESENT     Comment: Performed at [facility], [place] [address] [place],  [address]  CBG monitoring, ED     Status: None   Collection Time: 12/05/21  5:22 AM  Result Value Ref Range   Glucose-Capillary 95 70 - 99 mg/dL    Comment: Glucose reference range applies only to samples taken after fasting for at least 8 hours.   CT HEAD WO CONTRAST (5MM)  Result Date: 12/04/2021 CLINICAL DATA:  Delirium, recent urinary tract infection, hallucinations EXAM: CT HEAD WITHOUT CONTRAST TECHNIQUE: Contiguous axial images were obtained from the base of the skull through the vertex without intravenous contrast. COMPARISON:  10/15/2021 FINDINGS: Brain: Chronic ischemic changes are seen throughout the periventricular white matter, pons, and right cerebellar hemisphere. No signs of acute infarct or hemorrhage. Lateral ventricles and  remaining midline structures are unremarkable. There are no acute extra-axial fluid collections. Stable right frontotemporal meningioma. No mass effect. Vascular: No hyperdense vessel or unexpected calcification. Skull: Normal. Negative for fracture or focal lesion. Sinuses/Orbits: No acute finding. Other: None. IMPRESSION: 1. Chronic ischemic changes as above. No acute intracranial process. 2. Stable right frontotemporal meningioma. Electronically Signed   By: Randa Ngo M.D.   On:  12/04/2021 21:22    Pending Labs Unresulted Labs (From admission, onward)     Start     Ordered   12/05/21 1047  Vitamin B12  Add-on,   AD        12/05/21 1046   12/05/21 0118  Urine Culture  Once,   STAT       Question:  Indication  Answer:  Bacteriuria screening (OB/GYN or Uro)   12/05/21 0117   Signed and Held  CBC  (enoxaparin (LOVENOX)    CrCl < 30 ml/min)  Once,   R       Comments: Baseline for enoxaparin therapy IF NOT ALREADY DRAWN.  Notify MD if PLT < 100 K.    Signed and Held   Signed and Held  Creatinine, serum  (enoxaparin (LOVENOX)    CrCl < 30 ml/min)  Once,   R       Comments: Baseline for enoxaparin therapy IF NOT ALREADY DRAWN.    Signed and Held   Signed and Held  Creatinine, serum  (enoxaparin (LOVENOX)    CrCl < 30 ml/min)  Weekly,   R     Comments: while on enoxaparin therapy.    Signed and Held   Signed and Held  Basic metabolic panel  Tomorrow morning,   R        Signed and Held   Signed and Held  CBC  Tomorrow morning,   R        Signed and Held   Signed and Held  Hemoglobin A1c  Once,   R       Comments: To assess prior glycemic control    Signed and Held            Vitals/Pain Today's Vitals   12/05/21 0900 12/05/21 1000 12/05/21 1028 12/05/21 1244  BP: (!) 115/94 (!) 117/59 (!) 128/52 136/62  Pulse: (!) 59 64 70 60  Resp: 15 15 18 19   Temp:   97.8 F (36.6 C) 97.7 F (36.5 C)  TempSrc:   Oral Oral  SpO2: 99% 98% 97% 100%  Weight:      Height:      PainSc:    0-No pain 0-No pain    Isolation Precautions No active isolations  Medications Medications  0.9 %  sodium chloride infusion ( Intravenous New Bag/Given 12/05/21 1027)  cefTRIAXone (ROCEPHIN) 1 g in sodium chloride 0.9 % 100 mL IVPB (0 g Intravenous Stopped 12/05/21 0517)    Mobility walks with device Moderate fall risk      R Recommendations: See Admitting Provider Note  Report given to:   Additional Notes: pt mildly confused, able to get to bedside commode with assistance, pt oriented at this time and answering questions appropriately, no hallucinations reported at this time.

## 2021-12-05 NOTE — Assessment & Plan Note (Signed)
122

## 2021-12-05 NOTE — Assessment & Plan Note (Signed)
-   BP stable, continue beta-blocker, holding losartan due to mild acute kidney injury, may resume in a.m.

## 2021-12-05 NOTE — Assessment & Plan Note (Signed)
-   Patient also has indwelling Foley catheter due to urinary retention, follows outpatient urology.  Has history of frequent UTIs. -UA positive for UTI, follow urine culture and sensitivities -Received 1 dose of IV Rocephin in ED, will continue and adjust antibiotic according to urine culture.

## 2021-12-05 NOTE — Assessment & Plan Note (Signed)
-   Patient was diagnosed with urinary retention in 09/2021, has indwelling Foley catheter, follows outpatient urology.

## 2021-12-06 DIAGNOSIS — I10 Essential (primary) hypertension: Secondary | ICD-10-CM

## 2021-12-06 DIAGNOSIS — R441 Visual hallucinations: Secondary | ICD-10-CM

## 2021-12-06 DIAGNOSIS — I639 Cerebral infarction, unspecified: Secondary | ICD-10-CM

## 2021-12-06 DIAGNOSIS — E871 Hypo-osmolality and hyponatremia: Secondary | ICD-10-CM

## 2021-12-06 DIAGNOSIS — R339 Retention of urine, unspecified: Secondary | ICD-10-CM

## 2021-12-06 LAB — BASIC METABOLIC PANEL
Anion gap: 7 (ref 5–15)
BUN: 20 mg/dL (ref 8–23)
CO2: 23 mmol/L (ref 22–32)
Calcium: 8.5 mg/dL — ABNORMAL LOW (ref 8.9–10.3)
Chloride: 107 mmol/L (ref 98–111)
Creatinine, Ser: 1.04 mg/dL — ABNORMAL HIGH (ref 0.44–1.00)
GFR, Estimated: 55 mL/min — ABNORMAL LOW (ref >60)
Glucose, Bld: 169 mg/dL — ABNORMAL HIGH (ref 70–99)
Potassium: 4.6 mmol/L (ref 3.5–5.1)
Sodium: 137 mmol/L (ref 135–145)

## 2021-12-06 LAB — CBC
HCT: 29.7 % — ABNORMAL LOW (ref 36.0–46.0)
Hemoglobin: 9.8 g/dL — ABNORMAL LOW (ref 12.0–15.0)
MCH: 28.6 pg (ref 26.0–34.0)
MCHC: 33 g/dL (ref 30.0–36.0)
MCV: 86.6 fL (ref 80.0–100.0)
Platelets: 289 10*3/uL (ref 150–400)
RBC: 3.43 MIL/uL — ABNORMAL LOW (ref 3.87–5.11)
RDW: 15.9 % — ABNORMAL HIGH (ref 11.5–15.5)
WBC: 7.4 10*3/uL (ref 4.0–10.5)
nRBC: 0 % (ref 0.0–0.2)

## 2021-12-06 LAB — URINE CULTURE: Culture: NO GROWTH

## 2021-12-06 LAB — GLUCOSE, CAPILLARY
Glucose-Capillary: 127 mg/dL — ABNORMAL HIGH (ref 70–99)
Glucose-Capillary: 173 mg/dL — ABNORMAL HIGH (ref 70–99)
Glucose-Capillary: 242 mg/dL — ABNORMAL HIGH (ref 70–99)
Glucose-Capillary: 244 mg/dL — ABNORMAL HIGH (ref 70–99)

## 2021-12-06 MED ORDER — LOSARTAN POTASSIUM 25 MG PO TABS: 25.0000 mg | ORAL_TABLET | Freq: Every morning | ORAL | Status: AC

## 2021-12-06 NOTE — Discharge Summary (Signed)
Physician Discharge Summary  Sonya Duffy WLN:[id number] DOB: [birth year] DOA: 12/04/2021  PCP: Elwyn Reach, MD  Admit date: 12/04/2021 Discharge date: 12/06/2021 Admitted From: Home Disposition: Home Recommendations for Outpatient Follow-up:  Follow ups as below. Please obtain CBC/BMP/Mag at follow up Outpatient follow-up with urology for chronic Foley as previously planned Please follow up on the following pending results: None Home Health: Resumed home PT/OT/RN/SLP/aide Equipment/Devices: None Discharge Condition: Stable CODE STATUS: Full code  Follow-up [place] Follow up.   Specialty: Home Health Services Why: PT, OT, ST, RN, and aide               Hospital Course: 75 year old F with PMH of CAD, CVA, MSSA bacteremia, DM-2, HTN, HLD, chronic urinary retention with chronic Foley, recent diagnosis for UTI for which she was recently started on Cipro by PCP presenting with visual hallucination for 2 days and increased frequency of urination, and admitted with working diagnosis of acute metabolic encephalopathy in the setting of catheter associated UTI.  Afebrile.  Hemodynamically stable.  UA with large LE, small Hgb, rare bacteria but no nitrite.  Cultures obtained.  Started on IV ceftriaxone.   Patient has not had further hallucination.  Remains fully oriented since hospitalization.  Urine culture NGTD.  Indwelling Foley catheter exchanged.  Discharged to follow-up with his urologist outpatient.  She already received adequate course of Cipro before this hospitalization and 2 doses of ceftriaxone while in-house.  See individual problem list below for more on hospital course.  Discharge Diagnoses:  Acute metabolic encephalopathy/visual hallucination: Likely from Cipro versus UTI.  Resolved.  Acute lower UTI ruled out.  UA not convincing.  No fever or leukocytosis.  Urine culture negative. -Recently treated for UTI with p.o.  Cipro outpatient. -Received IV ceftriaxone x2  Urinary retention with indwelling Foley catheter -Foley catheter replaced -Outpatient follow-up with urology as previously planned  AKI ruled out.  Does not meet criteria for AKI Recent Labs    09/01/21 1721 09/02/21 0030 09/03/21 1030 09/04/21 0348 09/28/21 1804 10/15/21 1143 10/16/21 0500 10/17/21 0447 12/04/21 2107 12/05/21 1412 12/06/21 0332  BUN 18 15 13 15  25* 22 21 18 18   --  20  CREATININE 1.14* 1.14* 1.26* 1.15* 1.36* 0.94 0.88 0.93 1.11* 0.90 1.04*  -Hold losartan for few more days. -Recheck renal function at follow-up  Mild hyponatremia: Likely from losartan.  Resolved.  Uncontrolled IDDM-2 with hyperglycemia: A1c 9.0% on 11/80/22. -Continue home meds  CAD/history of PVD -Continue DAPT with Brilinta and aspirin as previously planned -Continue beta-blocker and statin.  History of CVA in 08/2021: No focal neuro symptoms -Continue DAPT and statin -Resumed home health.  Essential hypertension: BP slightly elevated -Continue home meds  Vitamin B12 deficiency: Improved. -Continue p.o. vitamin B12   Body mass index is 23.41 kg/m.        Discharge Exam: Vitals:   12/05/21 1344 12/05/21 2210 12/06/21 0500 12/06/21 1359  BP: (!) 144/54 (!) 129/50 (!) 146/58 (!) 159/64  Pulse: (!) 59 (!) 57 63 (!) 57  Temp: 97.6 F (36.4 C) 98.8 F (37.1 C) 98.6 F (37 C) 97.9 F (36.6 C)  Resp: 16 18 16 20   Height:      Weight:      SpO2: 100% 98% 98% 98%  TempSrc: Oral Oral Oral Oral  BMI (Calculated):         GENERAL: No apparent distress.  Nontoxic. HEENT: MMM.  Vision and hearing grossly intact.  NECK: Supple.  No apparent JVD.  RESP:  No IWOB.  Fair aeration bilaterally. CVS:  RRR. Heart sounds normal.  ABD/GI/GU: Bowel sounds present. Soft. Non tender.  Indwelling Foley catheter. MSK/EXT:  Moves extremities. No apparent deformity. No edema.  SKIN: no apparent skin lesion or wound NEURO: Awake and alert.   Oriented appropriately.  No apparent focal neuro deficit. PSYCH: Calm. Normal affect.   Discharge Instructions  Discharge Instructions     Diet - low sodium heart healthy   Complete by: As directed    Diet Carb Modified   Complete by: As directed    Discharge instructions   Complete by: As directed    It has been a pleasure taking care of you!  You were hospitalized with confusion and hallucination likely due to the antibiotics you have been started on for possible urinary tract infection.  Your urine culture here is negative for urinary tract infection.  We have changed your Foley catheter.  Please follow-up with your urologist to discuss the plan about your Foley catheter.  Review your new medication list and the directions on your medications before you take them.  Follow-up with your primary care doctor 1 to 2 weeks or sooner if needed   Take care,   Increase activity slowly   Complete by: As directed       Allergies as of 12/06/2021       Reactions   Ativan [lorazepam] Other (See Comments)   Causes agitation, confusion and paranoia. DO NOT GIVE. Takes many hours to wash out of her system.     Plavix [clopidogrel]    Intolerance - she is P2y12 non responder - do not use        Medication List     STOP taking these medications    ciprofloxacin 250 MG tablet Commonly known as: CIPRO       TAKE these medications    acetaminophen 500 MG tablet Commonly known as: TYLENOL Take 1,000 mg by mouth every 6 (six) hours as needed for headache (pain).   aspirin EC 81 MG tablet Take 1 tablet (81 mg total) by mouth daily. Swallow whole.   atorvastatin 40 MG tablet Commonly known as: LIPITOR Take 1 tablet (40 mg total) by mouth daily.   diphenoxylate-atropine 2.5-0.025 MG tablet Commonly known as: LOMOTIL Take 1 tablet by mouth every morning.   FLUoxetine 20 MG capsule Commonly known as: PROZAC Take 20 mg by mouth every morning.   insulin aspart 100 UNIT/ML  injection Commonly known as: novoLOG Inject 0-15 Units into the skin 4 (four) times daily -  before meals and at bedtime. CBG 70 - 120: 0 units  CBG 121 - 150: 2 units  CBG 151 - 200: 3 units  CBG 201 - 250: 5 units  CBG 251 - 300: 8 units  CBG 301 - 350: 11 units  CBG 351 - 400: 15 units  CBG > 400 call MD   Lantus SoloStar 100 UNIT/ML Solostar Pen Generic drug: insulin glargine Inject 20 Units into the skin at bedtime.   losartan 25 MG tablet Commonly known as: COZAAR Take 1 tablet (25 mg total) by mouth every morning. Start taking on: December 10, 2021 What changed: These instructions start on December 10, 2021. If you are unsure what to do until then, ask your doctor or other care provider.   melatonin 5 MG Tabs Take 5 mg by mouth daily.   metFORMIN 1000 MG tablet Commonly known as: GLUCOPHAGE Take  1,000 mg by mouth 2 (two) times daily.   metoprolol tartrate 25 MG tablet Commonly known as: LOPRESSOR Take 0.5 tablets (12.5 mg total) by mouth 2 (two) times daily.   pantoprazole 40 MG tablet Commonly known as: Protonix Take 1 tablet (40 mg total) by mouth daily.   ticagrelor 90 MG Tabs tablet Commonly known as: BRILINTA Take 1 tablet (90 mg total) by mouth 2 (two) times daily.        Consultations: None  Procedures/Studies: None   CT HEAD WO CONTRAST (5MM)  Result Date: 12/04/2021 CLINICAL DATA:  Delirium, recent urinary tract infection, hallucinations EXAM: CT HEAD WITHOUT CONTRAST TECHNIQUE: Contiguous axial images were obtained from the base of the skull through the vertex without intravenous contrast. COMPARISON:  10/15/2021 FINDINGS: Brain: Chronic ischemic changes are seen throughout the periventricular white matter, pons, and right cerebellar hemisphere. No signs of acute infarct or hemorrhage. Lateral ventricles and remaining midline structures are unremarkable. There are no acute extra-axial fluid collections. Stable right frontotemporal meningioma. No mass  effect. Vascular: No hyperdense vessel or unexpected calcification. Skull: Normal. Negative for fracture or focal lesion. Sinuses/Orbits: No acute finding. Other: None. IMPRESSION: 1. Chronic ischemic changes as above. No acute intracranial process. 2. Stable right frontotemporal meningioma. Electronically Signed   By: Randa Ngo M.D.   On: 12/04/2021 21:22       The results of significant diagnostics from this hospitalization (including imaging, microbiology, ancillary and laboratory) are listed below for reference.     Microbiology: Recent Results (from the past 240 hour(s))  Urine Culture     Status: None   Collection Time: 12/05/21 12:36 AM   Specimen: Urine, Clean Catch  Result Value Ref Range Status   Specimen Description   Final    URINE, CLEAN CATCH Performed at [facility], [place] [address] [place], [place] [address]    Special Requests   Final    NONE Performed at [facility], [place] [address], [place], [place] [address]    Culture   Final    NO GROWTH Performed at [facility], [place] [address], [place], [place] [address]    Report Status 12/06/2021 FINAL  Final  Resp Panel by RT-PCR (Flu A&B, Covid) Nasopharyngeal Swab     Status: None   Collection Time: 12/05/21  1:59 PM   Specimen: Nasopharyngeal Swab; Nasopharyngeal(NP) swabs in vial transport medium  Result Value Ref Range Status   SARS Coronavirus 2 by RT PCR NEGATIVE NEGATIVE Final    Comment: (NOTE) SARS-CoV-2 target nucleic acids are NOT DETECTED.  The SARS-CoV-2 RNA is generally detectable in upper respiratory specimens during the acute phase of infection. The lowest concentration of SARS-CoV-2 viral copies this assay can detect is 138 copies/mL. A negative result does not preclude SARS-Cov-2 infection and should not be used as the sole basis for treatment or other patient management decisions. A negative result may occur with  improper specimen  collection/handling, submission of specimen other than nasopharyngeal swab, presence of viral mutation(s) within the areas targeted by this assay, and inadequate number of viral copies(<138 copies/mL). A negative result must be combined with clinical observations, patient history, and epidemiological information. The expected result is Negative.  Fact Sheet for Patients:  EntrepreneurPulse.com.au  Fact Sheet for Healthcare Providers:  IncredibleEmployment.be  This test is no t yet approved or cleared by the [place] FDA and  has been authorized for detection and/or diagnosis of SARS-CoV-2 by FDA under an Emergency Use  Authorization (EUA). This EUA will remain  in effect (meaning this test can be used) for the duration of the COVID-19 declaration under Section 564(b)(1) of the Act, 21 U.S.C.section 360bbb-3(b)(1), unless the authorization is terminated  or revoked sooner.       Influenza A by PCR NEGATIVE NEGATIVE Final   Influenza B by PCR NEGATIVE NEGATIVE Final    Comment: (NOTE) The Xpert Xpress SARS-CoV-2/FLU/RSV plus assay is intended as an aid in the diagnosis of influenza from Nasopharyngeal swab specimens and should not be used as a sole basis for treatment. Nasal washings and aspirates are unacceptable for Xpert Xpress SARS-CoV-2/FLU/RSV testing.  Fact Sheet for Patients: EntrepreneurPulse.com.au  Fact Sheet for Healthcare Providers: IncredibleEmployment.be  This test is not yet approved or cleared by the [place] FDA and has been authorized for detection and/or diagnosis of SARS-CoV-2 by FDA under an Emergency Use Authorization (EUA). This EUA will remain in effect (meaning this test can be used) for the duration of the COVID-19 declaration under Section 564(b)(1) of the Act, 21 U.S.C. section 360bbb-3(b)(1), unless the authorization is terminated or revoked.  Performed at [facility], [place] [address], [place], [place] [address]      Labs:  CBC: Recent Labs  Lab 12/04/21 2107 12/05/21 1412 12/06/21 0332  WBC 10.5 6.9 7.4  HGB 10.6* 10.3* 9.8*  HCT 33.3* 31.9* 29.7*  MCV 88.8 87.4 86.6  PLT 341 290 289   BMP &GFR Recent Labs  Lab 12/04/21 2107 12/05/21 1412 12/06/21 0332  NA 132*  --  137  K 4.5  --  4.6  CL 106  --  107  CO2 20*  --  23  GLUCOSE 101*  --  169*  BUN 18  --  20  CREATININE 1.11* 0.90 1.04*  CALCIUM 8.4*  --  8.5*   Estimated Creatinine Clearance: 35.8 mL/min (A) (by C-G formula based on SCr of 1.04 mg/dL (H)). Liver & Pancreas: No results for input(s): AST, ALT, ALKPHOS, BILITOT, PROT, ALBUMIN in the last 168 hours. No results for input(s): LIPASE, AMYLASE in the last 168 hours. No results for input(s): AMMONIA in the last 168 hours. Diabetic: Recent Labs    12/05/21 1412  HGBA1C 6.5*   Recent Labs  Lab 12/05/21 0522 12/05/21 1709 12/05/21 2211 12/06/21 0737 12/06/21 1115  GLUCAP 95 312* 227* 127* 173*   Cardiac Enzymes: No results for input(s): CKTOTAL, CKMB, CKMBINDEX, TROPONINI in the last 168 hours. No results for input(s): PROBNP in the last 8760 hours. Coagulation Profile: No results for input(s): INR, PROTIME in the last 168 hours. Thyroid Function Tests: No results for input(s): TSH, T4TOTAL, FREET4, T3FREE, THYROIDAB in the last 72 hours. Lipid Profile: No results for input(s): CHOL, HDL, LDLCALC, TRIG, CHOLHDL, LDLDIRECT in the last 72 hours. Anemia Panel: Recent Labs    12/05/21 1412  VITAMINB12 241   Urine analysis:    Component Value Date/Time   COLORURINE YELLOW 12/05/2021 0036   APPEARANCEUR CLOUDY (A) 12/05/2021 0036   LABSPEC 1.016 12/05/2021 0036   PHURINE 5.0 12/05/2021 0036   GLUCOSEU NEGATIVE 12/05/2021 0036   HGBUR SMALL (A) 12/05/2021 0036   BILIRUBINUR NEGATIVE 12/05/2021 0036   KETONESUR NEGATIVE 12/05/2021 0036   PROTEINUR 30 (A) 12/05/2021 0036    UROBILINOGEN 0.2 10/15/2012 2343   NITRITE NEGATIVE 12/05/2021 0036   LEUKOCYTESUR LARGE (A) 12/05/2021 0036   Sepsis Labs: Invalid input(s): PROCALCITONIN, LACTICIDVEN   Time coordinating discharge: 45 minutes  SIGNED:  Charlesetta Ivory  Cyndia Skeeters, MD  [facility] 12/06/2021, 2:32 PM

## 2021-12-06 NOTE — Hospital Course (Signed)
Doing well 

## 2021-12-06 NOTE — TOC Transition Note (Signed)
Transition of Care [facility]) - CM/SW Discharge Note  Patient Details  Name: Sonya Duffy MRN: [id number] Date of Birth: [birth year]  Transition of Care [facility]) CM/SW Contact:  Sherie Don, LCSW Phone Number: 12/06/2021, 1:24 PM  Clinical Narrative: Readmission checklist completed due to high readmission score. PT evaluation recommended HHPT. CSW spoke with patient daughter, Sonya Duffy, and confirmed patient is active with [facility] for [facility]. [place] with [facility] confirmed patient is active and will need new orders. HH orders placed for PT, OT, ST, RN, and aide. CSW updated daughter. TOC signing off.  Final next level of care: [place] Barriers to Discharge: No Barriers Identified  Patient Goals and CMS Choice Patient states their goals for this hospitalization and ongoing recovery are:: Return home with [facility] through [facility] CMS Medicare.gov Compare Post Acute Care list provided to:: Patient Represenative (must comment) Choice offered to / list presented to : Adult Children Sonya Duffy (daughter))  Discharge Plan and Services        DME Arranged: N/A DME Agency: NA HH Arranged: RN, Nurse's Aide, Speech Therapy, PT, OT [place] Agency: [place] ([facility]) Date HH Agency Contacted: 12/06/21 Representative spoke with at [place]: Ramond Marrow  Readmission Risk Interventions Readmission Risk Prevention Plan 12/06/2021  Transportation Screening Complete  [employer] or [place] Complete  Social Work Consult for [place] Planning/Counseling Complete  Palliative Care Screening Not Applicable  Some recent data might be hidden

## 2022-01-06 ENCOUNTER — Encounter (HOSPITAL_COMMUNITY): Payer: Self-pay

## 2022-01-06 ENCOUNTER — Emergency Department (HOSPITAL_COMMUNITY)
Admission: EM | Admit: 2022-01-06 | Discharge: 2022-01-06 | Disposition: A | Discharge status: Home / Self Care | Payer: Medicare Other | Attending: Emergency Medicine | Admitting: Emergency Medicine

## 2022-01-06 ENCOUNTER — Other Ambulatory Visit: Payer: Self-pay

## 2022-01-06 DIAGNOSIS — Y846 Urinary catheterization as the cause of abnormal reaction of the patient, or of later complication, without mention of misadventure at the time of the procedure: Secondary | ICD-10-CM | POA: Diagnosis not present

## 2022-01-06 DIAGNOSIS — Z7982 Long term (current) use of aspirin: Secondary | ICD-10-CM | POA: Diagnosis not present

## 2022-01-06 DIAGNOSIS — T83091A Other mechanical complication of indwelling urethral catheter, initial encounter: Secondary | ICD-10-CM

## 2022-01-06 DIAGNOSIS — T83098A Other mechanical complication of other indwelling urethral catheter, initial encounter: Secondary | ICD-10-CM | POA: Insufficient documentation

## 2022-01-06 DIAGNOSIS — N390 Urinary tract infection, site not specified: Secondary | ICD-10-CM | POA: Diagnosis not present

## 2022-01-06 LAB — CBC WITH DIFFERENTIAL/PLATELET
Abs Immature Granulocytes: 0.05 10*3/uL (ref 0.00–0.07)
Basophils Absolute: 0 10*3/uL (ref 0.0–0.1)
Basophils Relative: 0 %
Eosinophils Absolute: 0 10*3/uL (ref 0.0–0.5)
Eosinophils Relative: 0 %
HCT: 32.6 % — ABNORMAL LOW (ref 36.0–46.0)
Hemoglobin: 10.3 g/dL — ABNORMAL LOW (ref 12.0–15.0)
Immature Granulocytes: 1 %
Lymphocytes Relative: 10 %
Lymphs Abs: 1.2 10*3/uL (ref 0.7–4.0)
MCH: 28 pg (ref 26.0–34.0)
MCHC: 31.6 g/dL (ref 30.0–36.0)
MCV: 88.6 fL (ref 80.0–100.0)
Monocytes Absolute: 0.6 10*3/uL (ref 0.1–1.0)
Monocytes Relative: 5 %
Neutro Abs: 9.3 10*3/uL — ABNORMAL HIGH (ref 1.7–7.7)
Neutrophils Relative %: 84 %
Platelets: 312 10*3/uL (ref 150–400)
RBC: 3.68 MIL/uL — ABNORMAL LOW (ref 3.87–5.11)
RDW: 15.2 % (ref 11.5–15.5)
WBC: 11.1 10*3/uL — ABNORMAL HIGH (ref 4.0–10.5)
nRBC: 0 % (ref 0.0–0.2)

## 2022-01-06 LAB — BASIC METABOLIC PANEL
Anion gap: 15 (ref 5–15)
BUN: 23 mg/dL (ref 8–23)
CO2: 17 mmol/L — ABNORMAL LOW (ref 22–32)
Calcium: 9.3 mg/dL (ref 8.9–10.3)
Chloride: 100 mmol/L (ref 98–111)
Creatinine, Ser: 0.9 mg/dL (ref 0.44–1.00)
GFR, Estimated: >60 mL/min (ref >60)
Glucose, Bld: 255 mg/dL — ABNORMAL HIGH (ref 70–99)
Potassium: 4.4 mmol/L (ref 3.5–5.1)
Sodium: 132 mmol/L — ABNORMAL LOW (ref 135–145)

## 2022-01-06 LAB — URINALYSIS, ROUTINE W REFLEX MICROSCOPIC
Bilirubin Urine: NEGATIVE
Glucose, UA: 50 mg/dL — AB
Ketones, ur: NEGATIVE mg/dL
Nitrite: NEGATIVE
Protein, ur: 100 mg/dL — AB
Specific Gravity, Urine: 1.005 (ref 1.005–1.030)
WBC, UA: >50 WBC/hpf — ABNORMAL HIGH (ref 0–5)
pH: 5 (ref 5.0–8.0)

## 2022-01-06 MED ORDER — SODIUM CHLORIDE 0.9 % IV BOLUS
500.0000 mL | Freq: Once | INTRAVENOUS | Status: AC
  Administered 2022-01-06: 500 mL via INTRAVENOUS

## 2022-01-06 MED ORDER — CEPHALEXIN 500 MG PO CAPS: 500.0000 mg | ORAL_CAPSULE | Freq: Two times a day (BID) | ORAL | 0 refills | Status: DC

## 2022-01-06 MED ORDER — SODIUM CHLORIDE 0.9 % IV SOLN
1.0000 g | Freq: Once | INTRAVENOUS | Status: AC
  Administered 2022-01-06: 1 g via INTRAVENOUS
  Filled 2022-01-06: qty 10

## 2022-01-06 NOTE — ED Triage Notes (Signed)
Patient BIB [facility] from home. Patient has had catheter pain since 10pm yesterday. Foley inserted Thursday.

## 2022-01-06 NOTE — ED Provider Notes (Signed)
[place] DEPT Provider Note   CSN: [id number] Arrival date & time: 01/06/22  4098     History  Chief Complaint  Patient presents with   Catheter Pain    Sonya Duffy is a 75 y.o. female.  The history is provided by the patient and medical records.  Sonya Duffy is a 76 y.o. female who presents to the Emergency Department complaining of catheter pain. She presents to the ED for evaluation for pain from her foley catheter that started earlier today.  She has a chronic foley (for the last few months).  Home health nurse came in and changed the catheter on Thursday.  Patient reports 1 to 2 days of decreased urine output in her leg bag.  No fever.  Had some nausea, which she attributes to pain.    Additional hx obtained from patient's daughter after pt's initial assessment.  Daughter states that on Thursday home health nurse came due to the catheter not being fully connected.  Daughter states that patient frequently has catheter discomfort.    Home Medications Prior to Admission medications   Medication Sig Start Date End Date Taking? Authorizing Provider  cephALEXin (KEFLEX) 500 MG capsule Take 1 capsule (500 mg total) by mouth 2 (two) times daily. 01/06/22  Yes Quintella Reichert, MD  acetaminophen (TYLENOL) 500 MG tablet Take 1,000 mg by mouth every 6 (six) hours as needed for headache (pain).    [provider]  aspirin EC 81 MG tablet Take 1 tablet (81 mg total) by mouth daily. Swallow whole. 11/23/21   Loel Dubonnet, NP  atorvastatin (LIPITOR) 40 MG tablet Take 1 tablet (40 mg total) by mouth daily. 09/06/21 09/06/22  Pokhrel, Corrie Mckusick, MD  diphenoxylate-atropine (LOMOTIL) 2.5-0.025 MG tablet Take 1 tablet by mouth every morning.    [provider]  FLUoxetine (PROZAC) 20 MG capsule Take 20 mg by mouth every morning. 09/20/21   [provider]  insulin aspart (NOVOLOG) 100 UNIT/ML injection Inject 0-15 Units into the  skin 4 (four) times daily -  before meals and at bedtime. CBG 70 - 120: 0 units  CBG 121 - 150: 2 units  CBG 151 - 200: 3 units  CBG 201 - 250: 5 units  CBG 251 - 300: 8 units  CBG 301 - 350: 11 units  CBG 351 - 400: 15 units  CBG > 400 call MD 10/19/21   Dwyane Dee, MD  LANTUS SOLOSTAR 100 UNIT/ML Solostar Pen Inject 20 Units into the skin at bedtime. 11/09/21   [provider]  losartan (COZAAR) 25 MG tablet Take 1 tablet (25 mg total) by mouth every morning. 12/10/21   Mercy Riding, MD  melatonin 5 MG TABS Take 5 mg by mouth daily.    [provider]  metFORMIN (GLUCOPHAGE) 1000 MG tablet Take 1,000 mg by mouth 2 (two) times daily. 08/26/19   [provider]  metoprolol tartrate (LOPRESSOR) 25 MG tablet Take 0.5 tablets (12.5 mg total) by mouth 2 (two) times daily. 10/19/21   Dwyane Dee, MD  pantoprazole (PROTONIX) 40 MG tablet Take 1 tablet (40 mg total) by mouth daily. 09/05/21 09/05/22  Pokhrel, Corrie Mckusick, MD  ticagrelor (BRILINTA) 90 MG TABS tablet Take 1 tablet (90 mg total) by mouth 2 (two) times daily. 11/23/21   Loel Dubonnet, NP      Allergies    Ativan [lorazepam] and Plavix [clopidogrel]    Review of Systems   Review of Systems  All other systems reviewed and are negative.  Physical Exam Updated Vital Signs BP (!) 109/59    Pulse 79    Temp (!) 97.3 F (36.3 C) (Oral)    Resp 17    Ht 5\' 2"  (1.575 m)    Wt 58 kg    SpO2 97%    BMI 23.39 kg/m  Physical Exam Vitals and nursing note reviewed.  Constitutional:      Appearance: She is well-developed.  HENT:     Head: Normocephalic and atraumatic.  Cardiovascular:     Rate and Rhythm: Normal rate and regular rhythm.     Heart sounds: No murmur heard. Pulmonary:     Effort: Pulmonary effort is normal. No respiratory distress.     Breath sounds: Normal breath sounds.  Abdominal:     Palpations: Abdomen is soft.     Tenderness: There is no abdominal tenderness. There is no guarding or  rebound.  Genitourinary:    Comments: 14 french foley catheter in place, slightly cloudy urine in the bag.  Musculoskeletal:        General: No swelling or tenderness.  Skin:    General: Skin is warm and dry.  Neurological:     Mental Status: She is alert.     Comments: MAE symmetrically  Psychiatric:        Behavior: Behavior normal.    ED Results / Procedures / Treatments   Labs (all labs ordered are listed, but only abnormal results are displayed) Labs Reviewed  BASIC METABOLIC PANEL - Abnormal; Notable for the following components:      Result Value   Sodium 132 (*)    CO2 17 (*)    Glucose, Bld 255 (*)    All other components within normal limits  CBC WITH DIFFERENTIAL/PLATELET - Abnormal; Notable for the following components:   WBC 11.1 (*)    RBC 3.68 (*)    Hemoglobin 10.3 (*)    HCT 32.6 (*)    Neutro Abs 9.3 (*)    All other components within normal limits  URINALYSIS, ROUTINE W REFLEX MICROSCOPIC - Abnormal; Notable for the following components:   APPearance CLOUDY (*)    Glucose, UA 50 (*)    Hgb urine dipstick LARGE (*)    Protein, ur 100 (*)    Leukocytes,Ua LARGE (*)    WBC, UA >50 (*)    Bacteria, UA MANY (*)    All other components within normal limits  URINE CULTURE    EKG None  Radiology No results found.  Procedures Procedures    Medications Ordered in ED Medications  cefTRIAXone (ROCEPHIN) 1 g in sodium chloride 0.9 % 100 mL IVPB (1 g Intravenous New Bag/Given 01/06/22 0705)  sodium chloride 0.9 % bolus 500 mL (500 mLs Intravenous New Bag/Given 01/06/22 0705)    ED Course/ Medical Decision Making/ A&P                           Medical Decision Making Amount and/or Complexity of Data Reviewed Labs: ordered.  Risk Prescription drug management.   Patient here for evaluation of catheter pain.  Patient assessed after nursing staff had replaced her Foley catheter.  Nursing reports that she had greater than 500 mL in her bladder.  On  assessment at the bedside patient is resting comfortably, states her pain is completely resolved.  Foley catheter does have cloudy yellow urine in the bag.  Given prolonged period of  decreased outputted renal function was obtained, which is stable compared to prior.  UA is concerning for UTI in the setting of her symptoms of cloudy urine, decreased output and pelvic pain.  We will send culture and start on antibiotics.  Discussed with patient and daughter findings of studies.  Plan to discharge home with oral antibiotics and return precautions.  Current clinical picture is not consistent with pyelonephritis.  No evidence of renal failure.       Final Clinical Impression(s) / ED Diagnoses Final diagnoses:  Complication, blocked Foley catheter, initial encounter ([place])  Acute UTI    Rx / DC Orders ED Discharge Orders          Ordered    cephALEXin (KEFLEX) 500 MG capsule  2 times daily        01/06/22 0706              Quintella Reichert, MD 01/06/22 0730

## 2022-01-06 NOTE — ED Notes (Signed)
Bladder scan 573mL.

## 2022-01-08 LAB — URINE CULTURE: Culture: >=100000 — AB

## 2022-01-09 ENCOUNTER — Telehealth: Payer: Self-pay | Admitting: Emergency Medicine

## 2022-01-09 NOTE — Telephone Encounter (Signed)
Post ED Visit - Positive Culture Follow-up  Culture report reviewed by antimicrobial stewardship pharmacist: [place] Team []  Elenor Quinones, Pharm.D. []  Heide Guile, Pharm.D., BCPS AQ-ID []  Parks Neptune, Pharm.D., BCPS []  Alycia Rossetti, Pharm.D., BCPS []  Rudy, Pharm.D., BCPS, AAHIVP []  Legrand Como, Pharm.D., BCPS, AAHIVP []  Salome Arnt, PharmD, BCPS []  Johnnette Gourd, PharmD, BCPS []  Hughes Better, PharmD, BCPS []  Leeroy Cha, PharmD []  Laqueta Linden, PharmD, BCPS []  Albertina Parr, PharmD  [place] Team []  Leodis Sias, PharmD []  Lindell Spar, PharmD []  Royetta Asal, PharmD []  Graylin Shiver, Rph []  Rema Fendt) Glennon Mac, PharmD []  Arlyn Dunning, PharmD []  Netta Cedars, PharmD []  Dia Sitter, PharmD []  Leone Haven, PharmD []  Gretta Arab, PharmD []  Theodis Shove, PharmD []  Peggyann Juba, PharmD []  Reuel Boom, PharmD Jimmy Footman PharmD   Positive urine culture Treated with cephalexin, organism sensitive to the same and no further patient follow-up is required at this time.  Hazle Nordmann 01/09/2022, 9:25 AM

## 2022-02-03 ENCOUNTER — Other Ambulatory Visit: Payer: Self-pay

## 2022-02-03 ENCOUNTER — Encounter (HOSPITAL_COMMUNITY): Payer: Self-pay

## 2022-02-03 ENCOUNTER — Emergency Department (HOSPITAL_COMMUNITY)
Admission: EM | Admit: 2022-02-03 | Discharge: 2022-02-03 | Disposition: A | Discharge status: Home / Self Care | Payer: Medicare Other | Attending: Emergency Medicine | Admitting: Emergency Medicine

## 2022-02-03 DIAGNOSIS — Z7984 Long term (current) use of oral hypoglycemic drugs: Secondary | ICD-10-CM | POA: Insufficient documentation

## 2022-02-03 DIAGNOSIS — Z7982 Long term (current) use of aspirin: Secondary | ICD-10-CM | POA: Insufficient documentation

## 2022-02-03 DIAGNOSIS — N3001 Acute cystitis with hematuria: Secondary | ICD-10-CM | POA: Diagnosis not present

## 2022-02-03 DIAGNOSIS — Z79899 Other long term (current) drug therapy: Secondary | ICD-10-CM | POA: Diagnosis not present

## 2022-02-03 DIAGNOSIS — R339 Retention of urine, unspecified: Secondary | ICD-10-CM | POA: Diagnosis not present

## 2022-02-03 DIAGNOSIS — E119 Type 2 diabetes mellitus without complications: Secondary | ICD-10-CM | POA: Diagnosis not present

## 2022-02-03 DIAGNOSIS — R300 Dysuria: Secondary | ICD-10-CM | POA: Diagnosis present

## 2022-02-03 DIAGNOSIS — Z794 Long term (current) use of insulin: Secondary | ICD-10-CM | POA: Insufficient documentation

## 2022-02-03 LAB — URINALYSIS, ROUTINE W REFLEX MICROSCOPIC
Bilirubin Urine: NEGATIVE
Glucose, UA: NEGATIVE mg/dL
Ketones, ur: NEGATIVE mg/dL
Nitrite: NEGATIVE
Protein, ur: 100 mg/dL — AB
Specific Gravity, Urine: 1.005 (ref 1.005–1.030)
WBC, UA: >50 WBC/hpf — ABNORMAL HIGH (ref 0–5)
pH: 5 (ref 5.0–8.0)

## 2022-02-03 LAB — CBC WITH DIFFERENTIAL/PLATELET
Abs Immature Granulocytes: 0.04 10*3/uL (ref 0.00–0.07)
Basophils Absolute: 0 10*3/uL (ref 0.0–0.1)
Basophils Relative: 0 %
Eosinophils Absolute: 0.1 10*3/uL (ref 0.0–0.5)
Eosinophils Relative: 0 %
HCT: 36.1 % (ref 36.0–46.0)
Hemoglobin: 11.7 g/dL — ABNORMAL LOW (ref 12.0–15.0)
Immature Granulocytes: 0 %
Lymphocytes Relative: 24 %
Lymphs Abs: 3 10*3/uL (ref 0.7–4.0)
MCH: 28.1 pg (ref 26.0–34.0)
MCHC: 32.4 g/dL (ref 30.0–36.0)
MCV: 86.8 fL (ref 80.0–100.0)
Monocytes Absolute: 0.6 10*3/uL (ref 0.1–1.0)
Monocytes Relative: 5 %
Neutro Abs: 8.8 10*3/uL — ABNORMAL HIGH (ref 1.7–7.7)
Neutrophils Relative %: 71 %
Platelets: 346 10*3/uL (ref 150–400)
RBC: 4.16 MIL/uL (ref 3.87–5.11)
RDW: 15.1 % (ref 11.5–15.5)
WBC: 12.6 10*3/uL — ABNORMAL HIGH (ref 4.0–10.5)
nRBC: 0 % (ref 0.0–0.2)

## 2022-02-03 LAB — BASIC METABOLIC PANEL
Anion gap: 12 (ref 5–15)
BUN: 22 mg/dL (ref 8–23)
CO2: 19 mmol/L — ABNORMAL LOW (ref 22–32)
Calcium: 9.4 mg/dL (ref 8.9–10.3)
Chloride: 105 mmol/L (ref 98–111)
Creatinine, Ser: 0.95 mg/dL (ref 0.44–1.00)
GFR, Estimated: >60 mL/min (ref >60)
Glucose, Bld: 84 mg/dL (ref 70–99)
Potassium: 4.5 mmol/L (ref 3.5–5.1)
Sodium: 136 mmol/L (ref 135–145)

## 2022-02-03 LAB — CBG MONITORING, ED: Glucose-Capillary: 84 mg/dL (ref 70–99)

## 2022-02-03 MED ORDER — CEPHALEXIN 500 MG PO CAPS
500.0000 mg | ORAL_CAPSULE | Freq: Once | ORAL | Status: AC
  Administered 2022-02-03: 500 mg via ORAL
  Filled 2022-02-03: qty 1

## 2022-02-03 MED ORDER — PHENAZOPYRIDINE HCL 100 MG PO TABS
100.0000 mg | ORAL_TABLET | Freq: Once | ORAL | Status: AC
  Administered 2022-02-03: 100 mg via ORAL
  Filled 2022-02-03: qty 1

## 2022-02-03 MED ORDER — CEPHALEXIN 500 MG PO CAPS: 500.0000 mg | ORAL_CAPSULE | Freq: Four times a day (QID) | ORAL | 0 refills | Status: AC

## 2022-02-03 NOTE — ED Notes (Signed)
Sitting up in bed eating dinner. Tolerated well. No signs of distress. Catheter patent and draining. No complaints of pain. VSS

## 2022-02-03 NOTE — ED Notes (Signed)
Dinner tray given

## 2022-02-03 NOTE — ED Notes (Signed)
Resting quietly. No signs of distress. VSS. No complaints.

## 2022-02-03 NOTE — Discharge Instructions (Addendum)
You were seen today for urinary retention. Your catheter was changed today. I recommend follow up with your primary care physician or urologist. Your urine showed a urinary tract infection. I have prescribed an antibiotic. You should complete the entire course. Please return to the emergency department if you develop a life threatening condition such as chest pain, shortness of breath, or altered level of consciousness.

## 2022-02-03 NOTE — ED Notes (Signed)
Spoke with patient's daughter. Stated she would pick her up within the hour.

## 2022-02-03 NOTE — ED Provider Notes (Addendum)
[facility] DEPT Provider Note   CSN: [id number] Arrival date & time: 02/03/22  1358     History  Chief Complaint  Patient presents with   Dysuria    Sonya Duffy is a 76 y.o. female. The patient presents to the emergency department via EMS complaining of pain around her urinary catheter. The patient called EMS due to low blood sugar reported as being in the low 60s but drank juice prior to EMS arrival and it had improved to the 80s. Once EMS arrived she complained of the pain around her catheter and requested transport to the hospital. The patient was seen for a catheter blockage at the emergency room in January of this year. She has extensive PMH including urinary retention, NSTEMI, cerebral thrombosis with cerebral infarction, DKA, MSSA bacteremia, acute ischemic stroke, Type 2 DM with hyperglycemia, long-term current use of insulin, acute metabolic encephalopathy, hyponatremia  HPI     Home Medications Prior to Admission medications   Medication Sig Start Date End Date Taking? Authorizing Provider  cephALEXin (KEFLEX) 500 MG capsule Take 1 capsule (500 mg total) by mouth 4 (four) times daily. 02/03/22  Yes Cherlynn June B, PA  acetaminophen (TYLENOL) 500 MG tablet Take 1,000 mg by mouth every 6 (six) hours as needed for headache (pain).    [provider]  aspirin EC 81 MG tablet Take 1 tablet (81 mg total) by mouth daily. Swallow whole. 11/23/21   Loel Dubonnet, NP  atorvastatin (LIPITOR) 40 MG tablet Take 1 tablet (40 mg total) by mouth daily. 09/06/21 09/06/22  Pokhrel, Corrie Mckusick, MD  diphenoxylate-atropine (LOMOTIL) 2.5-0.025 MG tablet Take 1 tablet by mouth every morning.    [provider]  FLUoxetine (PROZAC) 20 MG capsule Take 20 mg by mouth every morning. 09/20/21   [provider]  insulin aspart (NOVOLOG) 100 UNIT/ML injection Inject 0-15 Units into the skin 4 (four) times daily -  before meals and at bedtime.  CBG 70 - 120: 0 units  CBG 121 - 150: 2 units  CBG 151 - 200: 3 units  CBG 201 - 250: 5 units  CBG 251 - 300: 8 units  CBG 301 - 350: 11 units  CBG 351 - 400: 15 units  CBG > 400 call MD 10/19/21   Dwyane Dee, MD  LANTUS SOLOSTAR 100 UNIT/ML Solostar Pen Inject 20 Units into the skin at bedtime. 11/09/21   [provider]  losartan (COZAAR) 25 MG tablet Take 1 tablet (25 mg total) by mouth every morning. 12/10/21   Mercy Riding, MD  melatonin 5 MG TABS Take 5 mg by mouth daily.    [provider]  metFORMIN (GLUCOPHAGE) 1000 MG tablet Take 1,000 mg by mouth 2 (two) times daily. 08/26/19   [provider]  metoprolol tartrate (LOPRESSOR) 25 MG tablet Take 0.5 tablets (12.5 mg total) by mouth 2 (two) times daily. 10/19/21   Dwyane Dee, MD  pantoprazole (PROTONIX) 40 MG tablet Take 1 tablet (40 mg total) by mouth daily. 09/05/21 09/05/22  Pokhrel, Corrie Mckusick, MD  ticagrelor (BRILINTA) 90 MG TABS tablet Take 1 tablet (90 mg total) by mouth 2 (two) times daily. 11/23/21   Loel Dubonnet, NP      Allergies    Ativan [lorazepam], Cipro [ciprofloxacin hcl], and Plavix [clopidogrel]    Review of Systems   Review of Systems  Constitutional:  Negative for fever.   Physical Exam Updated Vital Signs BP (!) 94/42 (BP Location: Right  Arm)    Pulse 74    Temp (!) 97.5 F (36.4 C) (Oral)    Resp 18    Ht 5\' 2"  (1.575 m)    Wt 56.7 kg    SpO2 98%    BMI 22.86 kg/m  Physical Exam Constitutional:      General: She is not in acute distress. HENT:     Head: Normocephalic.  Eyes:     Conjunctiva/sclera: Conjunctivae normal.  Cardiovascular:     Rate and Rhythm: Normal rate and regular rhythm.  Pulmonary:     Effort: Pulmonary effort is normal.     Breath sounds: Normal breath sounds.  Abdominal:     Palpations: Abdomen is soft.     Tenderness: There is no abdominal tenderness.  Skin:    General: Skin is warm and dry.  Neurological:     Mental Status: She is alert.     ED Results / Procedures / Treatments   Labs (all labs ordered are listed, but only abnormal results are displayed) Labs Reviewed  BASIC METABOLIC PANEL - Abnormal; Notable for the following components:      Result Value   CO2 19 (*)    All other components within normal limits  CBC WITH DIFFERENTIAL/PLATELET - Abnormal; Notable for the following components:   WBC 12.6 (*)    Hemoglobin 11.7 (*)    Neutro Abs 8.8 (*)    All other components within normal limits  URINALYSIS, ROUTINE W REFLEX MICROSCOPIC - Abnormal; Notable for the following components:   APPearance CLOUDY (*)    Hgb urine dipstick LARGE (*)    Protein, ur 100 (*)    Leukocytes,Ua LARGE (*)    WBC, UA >50 (*)    Bacteria, UA MANY (*)    All other components within normal limits  URINE CULTURE  CBG MONITORING, ED    EKG None  Radiology No results found.  Procedures Procedures    Medications Ordered in ED Medications  phenazopyridine (PYRIDIUM) tablet 100 mg (100 mg Oral Given 02/03/22 1522)    ED Course/ Medical Decision Making/ A&P                           Medical Decision Making Amount and/or Complexity of Data Reviewed Labs: ordered.  Risk Prescription drug management.   This patient presents to the ED for concern of urinary pain and retention, this involves an extensive number of treatment options, and is a complaint that carries with it a high risk of complications and morbidity.  The differential diagnosis includes but is not limited to uti, clot causing a blockage, trauma, pyelonephritis, and others    Co morbidities that complicate the patient evaluation  CVA, urinary retention, DM   Additional history obtained:  Additional history obtained from EMS External records from outside source obtained and reviewed including hospital notes from January 2023 and December 2022   Lab Tests:  I Ordered, and personally interpreted labs.  The pertinent results include:  WBC 12.6, many  bacteria    Medicines ordered and prescription drug management:   I have reviewed the patients home medicines and have made adjustments as needed   Reevaluation:  After the interventions noted above, I reevaluated the patient and found that they have :improved   Dispostion:  After consideration of the diagnostic results and the patients response to treatment, I feel that the patent would benefit from discharge home. The urinary tract infection can be  treated as an outpatient. The patient has much less pain after the catheter was changed. There was a large amount of urine collected immediately after changing the catheter. There was likely a blockage. The patient has history of the same. I see no reason for admission at this time. I will prescribe Keflex. I recommend follow up with her PCP or urologist. Return precautions provided    Final Clinical Impression(s) / ED Diagnoses Final diagnoses:  Acute cystitis with hematuria  Urinary retention    Rx / DC Orders ED Discharge Orders          Ordered    cephALEXin (KEFLEX) 500 MG capsule  4 times daily        02/03/22 1709              Dorothyann Peng, [place] 02/03/22 [place], [place] 02/03/22 1715    Carmin Muskrat, MD 02/03/22 2239

## 2022-02-03 NOTE — ED Triage Notes (Signed)
"  From home, call came out for low blood glucose, but she had drank juice and it was better, but then she complained bladder pain around her indwelling foley catheter and wanted to come and have it changed out. She normally has it changed every 18 days, but can not remember when it was changed last" per EMS

## 2022-02-05 LAB — URINE CULTURE: Culture: >=100000 — AB

## 2022-02-06 ENCOUNTER — Telehealth: Payer: Self-pay | Admitting: Emergency Medicine

## 2022-02-06 NOTE — Telephone Encounter (Signed)
Post ED Visit - Positive Culture Follow-up ? ?Culture report reviewed by antimicrobial stewardship pharmacist: ?[place] Team ?[]  Elenor Quinones, Pharm.D. ?[]  Heide Guile, Pharm.D., BCPS AQ-ID ?[]  Parks Neptune, Pharm.D., BCPS ?[]  Alycia Rossetti, Pharm.D., BCPS ?[]  Jacobus, Pharm.D., BCPS, AAHIVP ?[]  Legrand Como, Pharm.D., BCPS, AAHIVP ?[]  Salome Arnt, PharmD, BCPS ?[]  Johnnette Gourd, PharmD, BCPS ?[]  Hughes Better, PharmD, BCPS ?[]  Leeroy Cha, PharmD ?[]  Laqueta Linden, PharmD, BCPS ?[]  Albertina Parr, PharmD ? ?[place] Team ?[]  Leodis Sias, PharmD ?[]  Lindell Spar, PharmD ?[]  Royetta Asal, PharmD ?[]  Graylin Shiver, Rph ?[]  Rema Fendt) Glennon Mac, PharmD ?[]  Arlyn Dunning, PharmD ?[]  Netta Cedars, PharmD ?[]  Dia Sitter, PharmD ?[]  Leone Haven, PharmD ?[]  Gretta Arab, PharmD ?[]  Theodis Shove, PharmD ?[]  Peggyann Juba, PharmD ?[]  Reuel Boom, PharmD ? ? ?Positive urine culture ?Treated with cephalexin, organism sensitive to the same and no further patient follow-up is required at this time. ? ?Hazle Nordmann ?02/06/2022, 12:42 PM ?  ?

## 2022-02-21 ENCOUNTER — Ambulatory Visit (HOSPITAL_BASED_OUTPATIENT_CLINIC_OR_DEPARTMENT_OTHER): Payer: Medicare Other | Admitting: Family

## 2022-09-30 ENCOUNTER — Other Ambulatory Visit (HOSPITAL_BASED_OUTPATIENT_CLINIC_OR_DEPARTMENT_OTHER): Payer: Self-pay

## 2022-11-13 ENCOUNTER — Encounter (HOSPITAL_COMMUNITY): Payer: Self-pay

## 2022-11-13 ENCOUNTER — Emergency Department (HOSPITAL_COMMUNITY)
Admission: EM | Admit: 2022-11-13 | Discharge: 2022-11-13 | Discharge status: Against Medical Advice | Payer: Medicare Other | Attending: Emergency Medicine | Admitting: Emergency Medicine

## 2022-11-13 DIAGNOSIS — Z5321 Procedure and treatment not carried out due to patient leaving prior to being seen by health care provider: Secondary | ICD-10-CM | POA: Insufficient documentation

## 2022-11-13 DIAGNOSIS — R197 Diarrhea, unspecified: Secondary | ICD-10-CM | POA: Diagnosis present

## 2022-11-13 LAB — COMPREHENSIVE METABOLIC PANEL
ALT: 13 U/L (ref 0–44)
AST: 18 U/L (ref 15–41)
Albumin: 4 g/dL (ref 3.5–5.0)
Alkaline Phosphatase: 74 U/L (ref 38–126)
Anion gap: 9 (ref 5–15)
BUN: 20 mg/dL (ref 8–23)
CO2: 23 mmol/L (ref 22–32)
Calcium: 9.5 mg/dL (ref 8.9–10.3)
Chloride: 108 mmol/L (ref 98–111)
Creatinine, Ser: 1.12 mg/dL — ABNORMAL HIGH (ref 0.44–1.00)
GFR, Estimated: 50 mL/min — ABNORMAL LOW (ref >60)
Glucose, Bld: 109 mg/dL — ABNORMAL HIGH (ref 70–99)
Potassium: 4.6 mmol/L (ref 3.5–5.1)
Sodium: 140 mmol/L (ref 135–145)
Total Bilirubin: 0.5 mg/dL (ref 0.3–1.2)
Total Protein: 8 g/dL (ref 6.5–8.1)

## 2022-11-13 LAB — CBC WITH DIFFERENTIAL/PLATELET
Abs Immature Granulocytes: 0.03 10*3/uL (ref 0.00–0.07)
Basophils Absolute: 0 10*3/uL (ref 0.0–0.1)
Basophils Relative: 0 %
Eosinophils Absolute: 0.2 10*3/uL (ref 0.0–0.5)
Eosinophils Relative: 2 %
HCT: 35.8 % — ABNORMAL LOW (ref 36.0–46.0)
Hemoglobin: 11 g/dL — ABNORMAL LOW (ref 12.0–15.0)
Immature Granulocytes: 0 %
Lymphocytes Relative: 33 %
Lymphs Abs: 2.8 10*3/uL (ref 0.7–4.0)
MCH: 27.3 pg (ref 26.0–34.0)
MCHC: 30.7 g/dL (ref 30.0–36.0)
MCV: 88.8 fL (ref 80.0–100.0)
Monocytes Absolute: 0.6 10*3/uL (ref 0.1–1.0)
Monocytes Relative: 7 %
Neutro Abs: 4.9 10*3/uL (ref 1.7–7.7)
Neutrophils Relative %: 58 %
Platelets: 339 10*3/uL (ref 150–400)
RBC: 4.03 MIL/uL (ref 3.87–5.11)
RDW: 14.9 % (ref 11.5–15.5)
WBC: 8.5 10*3/uL (ref 4.0–10.5)
nRBC: 0 % (ref 0.0–0.2)

## 2022-11-13 LAB — TROPONIN I (HIGH SENSITIVITY): Troponin I (High Sensitivity): 38 ng/L — ABNORMAL HIGH (ref <18)

## 2022-11-13 LAB — LIPASE, BLOOD: Lipase: 37 U/L (ref 11–51)

## 2022-11-13 LAB — CBG MONITORING, ED: Glucose-Capillary: 109 mg/dL — ABNORMAL HIGH (ref 70–99)

## 2022-11-13 NOTE — ED Notes (Signed)
Pt. Left without being seen, RN made aware.

## 2022-11-13 NOTE — ED Notes (Signed)
Blue top and Dr green sent to lab

## 2022-11-13 NOTE — ED Triage Notes (Signed)
Pt arrived via POV. C/o vaginal bleeding, diarrhea, and generalized weakness since yesterday. Denies any blood in catheter. Family states no rectal bleeding.

## 2022-11-13 NOTE — ED Provider Triage Note (Signed)
Emergency Medicine Provider Triage Evaluation Note  Sonya Duffy , a 76 y.o. female  was evaluated in triage.  Pt complains of multiple complaints.  Patient is on an antibiotic currently for UTI, has a Foley catheter.  Yesterday started having diarrhea, 3 episodes today.  Denies any fevers at home but is having suprapubic abdominal pain.  Denies chest pain or shortness of breath.  She is feeling weak all over, endorses vaginal bleeding which they state is not coming from her urine or from her rectum.  Denies any vomiting..  Review of Systems  Per HPI  Physical Exam  BP (!) 184/61 (BP Location: Right Arm)   Pulse 68   Temp 97.9 F (36.6 C) (Oral)   Resp 16   SpO2 100%  Gen:   Awake, no distress   Resp:  Normal effort  MSK:   Moves extremities without difficulty  Other:  Upper and lower extremity pulses are symmetric bilaterally, suprapubic tenderness on exam.  Medical Decision Making  Medically screening exam initiated at 5:39 PM.  Appropriate orders placed.  [name] was informed that the remainder of the evaluation will be completed by another provider, this initial triage assessment does not replace that evaluation, and the importance of remaining in the ED until their evaluation is complete.  Labs, culture given on antibiotics and will add troponin given new T wave inversions on EKG.   Sherrill Raring, PA-C 11/13/22 1741

## 2023-10-11 ENCOUNTER — Other Ambulatory Visit (HOSPITAL_BASED_OUTPATIENT_CLINIC_OR_DEPARTMENT_OTHER): Payer: Self-pay
